# Patient Record
Sex: Female | Born: 1960 | Race: White | Hispanic: No | Marital: Married | Smoking: Never smoker
Health system: Southern US, Community
[De-identification: ages and names within clinical notes are randomized; demographics above are authoritative.]

## PROBLEM LIST (undated history)

## (undated) DIAGNOSIS — J8410 Pulmonary fibrosis, unspecified: Secondary | ICD-10-CM

## (undated) DIAGNOSIS — I10 Essential (primary) hypertension: Secondary | ICD-10-CM

## (undated) DIAGNOSIS — N3010 Interstitial cystitis (chronic) without hematuria: Secondary | ICD-10-CM

## (undated) DIAGNOSIS — K219 Gastro-esophageal reflux disease without esophagitis: Secondary | ICD-10-CM

## (undated) DIAGNOSIS — E039 Hypothyroidism, unspecified: Secondary | ICD-10-CM

## (undated) DIAGNOSIS — Z9981 Dependence on supplemental oxygen: Secondary | ICD-10-CM

## (undated) DIAGNOSIS — F32A Depression, unspecified: Secondary | ICD-10-CM

## (undated) DIAGNOSIS — M797 Fibromyalgia: Secondary | ICD-10-CM

## (undated) DIAGNOSIS — K760 Fatty (change of) liver, not elsewhere classified: Secondary | ICD-10-CM

## (undated) DIAGNOSIS — R51 Headache: Secondary | ICD-10-CM

## (undated) DIAGNOSIS — K589 Irritable bowel syndrome without diarrhea: Secondary | ICD-10-CM

## (undated) DIAGNOSIS — D649 Anemia, unspecified: Secondary | ICD-10-CM

## (undated) DIAGNOSIS — J189 Pneumonia, unspecified organism: Secondary | ICD-10-CM

## (undated) DIAGNOSIS — G4730 Sleep apnea, unspecified: Secondary | ICD-10-CM

## (undated) DIAGNOSIS — R350 Frequency of micturition: Secondary | ICD-10-CM

## (undated) DIAGNOSIS — M7989 Other specified soft tissue disorders: Secondary | ICD-10-CM

## (undated) DIAGNOSIS — J45909 Unspecified asthma, uncomplicated: Secondary | ICD-10-CM

## (undated) DIAGNOSIS — F329 Major depressive disorder, single episode, unspecified: Secondary | ICD-10-CM

## (undated) DIAGNOSIS — Z973 Presence of spectacles and contact lenses: Secondary | ICD-10-CM

## (undated) DIAGNOSIS — N2889 Other specified disorders of kidney and ureter: Secondary | ICD-10-CM

## (undated) DIAGNOSIS — C801 Malignant (primary) neoplasm, unspecified: Secondary | ICD-10-CM

## (undated) HISTORY — PX: COLONOSCOPY: SHX5424

## (undated) HISTORY — PX: CYSTOSCOPY: SHX5120

## (undated) HISTORY — PX: OTHER SURGICAL HISTORY: SHX169

## (undated) HISTORY — PX: TUBAL LIGATION: SHX77

---

## 1993-11-09 HISTORY — PX: CHOLECYSTECTOMY: SHX55

## 1994-11-09 HISTORY — PX: ABDOMINAL HYSTERECTOMY: SHX81

## 2002-11-09 HISTORY — PX: POSTERIOR CERVICAL LAMINECTOMY: SHX2248

## 2008-02-21 ENCOUNTER — Ambulatory Visit: Payer: Self-pay

## 2008-06-15 ENCOUNTER — Ambulatory Visit: Payer: Self-pay

## 2008-12-13 ENCOUNTER — Ambulatory Visit: Payer: Self-pay | Admitting: Unknown Physician Specialty

## 2009-01-23 ENCOUNTER — Encounter: Payer: Self-pay | Admitting: Unknown Physician Specialty

## 2009-02-07 ENCOUNTER — Encounter: Payer: Self-pay | Admitting: Unknown Physician Specialty

## 2009-03-09 ENCOUNTER — Encounter: Payer: Self-pay | Admitting: Unknown Physician Specialty

## 2009-04-10 ENCOUNTER — Ambulatory Visit: Payer: Self-pay

## 2009-04-18 ENCOUNTER — Ambulatory Visit: Payer: Self-pay

## 2009-11-04 ENCOUNTER — Ambulatory Visit: Payer: Self-pay

## 2010-11-11 ENCOUNTER — Ambulatory Visit: Payer: Self-pay | Admitting: Otolaryngology

## 2011-07-15 ENCOUNTER — Ambulatory Visit: Payer: Self-pay

## 2012-07-10 HISTORY — PX: CERVICAL LAMINECTOMY: SHX94

## 2012-08-06 ENCOUNTER — Emergency Department (HOSPITAL_COMMUNITY): Payer: PRIVATE HEALTH INSURANCE

## 2012-08-06 ENCOUNTER — Encounter (HOSPITAL_COMMUNITY): Payer: Self-pay | Admitting: Emergency Medicine

## 2012-08-06 ENCOUNTER — Emergency Department (HOSPITAL_COMMUNITY)
Admission: EM | Admit: 2012-08-06 | Discharge: 2012-08-06 | Disposition: A | Discharge status: Home / Self Care | Payer: PRIVATE HEALTH INSURANCE | Attending: Emergency Medicine | Admitting: Emergency Medicine

## 2012-08-06 DIAGNOSIS — M5412 Radiculopathy, cervical region: Secondary | ICD-10-CM | POA: Insufficient documentation

## 2012-08-06 DIAGNOSIS — J45909 Unspecified asthma, uncomplicated: Secondary | ICD-10-CM | POA: Insufficient documentation

## 2012-08-06 DIAGNOSIS — Z794 Long term (current) use of insulin: Secondary | ICD-10-CM | POA: Insufficient documentation

## 2012-08-06 DIAGNOSIS — M79609 Pain in unspecified limb: Secondary | ICD-10-CM | POA: Insufficient documentation

## 2012-08-06 DIAGNOSIS — E119 Type 2 diabetes mellitus without complications: Secondary | ICD-10-CM | POA: Insufficient documentation

## 2012-08-06 DIAGNOSIS — Z79899 Other long term (current) drug therapy: Secondary | ICD-10-CM | POA: Insufficient documentation

## 2012-08-06 HISTORY — DX: Unspecified asthma, uncomplicated: J45.909

## 2012-08-06 MED ORDER — ONDANSETRON HCL 4 MG/2ML IJ SOLN
4.0000 mg | Freq: Once | INTRAMUSCULAR | Status: AC
  Administered 2012-08-06: 4 mg via INTRAVENOUS
  Filled 2012-08-06: qty 2

## 2012-08-06 MED ORDER — SODIUM CHLORIDE 0.9 % IV SOLN
Freq: Once | INTRAVENOUS | Status: AC
  Administered 2012-08-06: 13:00:00 via INTRAVENOUS

## 2012-08-06 MED ORDER — HYDROMORPHONE HCL PF 1 MG/ML IJ SOLN
1.0000 mg | Freq: Once | INTRAMUSCULAR | Status: AC
  Administered 2012-08-06: 1 mg via INTRAVENOUS
  Filled 2012-08-06: qty 1

## 2012-08-06 MED ORDER — DIAZEPAM 5 MG PO TABS: 5.0000 mg | ORAL_TABLET | Freq: Three times a day (TID) | ORAL | Status: DC | PRN

## 2012-08-06 MED ORDER — KETOROLAC TROMETHAMINE 30 MG/ML IJ SOLN
INTRAMUSCULAR | Status: AC
  Filled 2012-08-06: qty 1

## 2012-08-06 MED ORDER — SODIUM CHLORIDE 0.9 % IV BOLUS (SEPSIS)
1000.0000 mL | Freq: Once | INTRAVENOUS | Status: AC
  Administered 2012-08-06: 1000 mL via INTRAVENOUS

## 2012-08-06 MED ORDER — DIAZEPAM 5 MG/ML IJ SOLN
5.0000 mg | Freq: Once | INTRAMUSCULAR | Status: AC
  Administered 2012-08-06: 5 mg via INTRAVENOUS
  Filled 2012-08-06: qty 2

## 2012-08-06 MED ORDER — PREDNISONE (PAK) 10 MG PO TABS: 10.0000 mg | ORAL_TABLET | Freq: Every day | ORAL | Status: DC

## 2012-08-06 MED ORDER — KETOROLAC TROMETHAMINE 15 MG/ML IJ SOLN
15.0000 mg | Freq: Once | INTRAMUSCULAR | Status: AC
  Administered 2012-08-06: 15 mg via INTRAVENOUS

## 2012-08-06 MED ORDER — MORPHINE SULFATE 4 MG/ML IJ SOLN
6.0000 mg | Freq: Once | INTRAMUSCULAR | Status: AC
  Administered 2012-08-06: 6 mg via INTRAVENOUS
  Filled 2012-08-06: qty 2

## 2012-08-06 MED ORDER — OXYCODONE-ACETAMINOPHEN 5-325 MG PO TABS: 1.0000 | ORAL_TABLET | ORAL | Status: DC | PRN

## 2012-08-06 NOTE — ED Notes (Addendum)
Pt c/o neck and left arm pain onset 1 hour ago. Pt had flu shot 2 days ago given in left arm. Pt reports pain radiates from left side of neck down to fingers of left arm. Pt reports pain is not like soreness from flu shot, feels like when she blew out a disk in the past. Pt took 2 advil at 0800 without relief.

## 2012-08-06 NOTE — ED Provider Notes (Signed)
History    51 year old female with left arm pain. Onset about an hour and half prior to arrival. Patient denies trauma. She says she's going to get breakfast the pain started. Pain starts in her left shoulder and radiates all the way down her hand. Worse with movements. Her left arm and hand feels weak as compared to the right. No numbness or tingling. No fevers or chills. Denies use of blood thinning medication. Denies recent trauma. CSN: [id number]  Arrival date & time 08/06/12  [id number]   First MD Initiated Contact with Patient 08/06/12 0920      Chief Complaint  Patient presents with  . Neck Pain  . Arm Pain    (Consider location/radiation/quality/duration/timing/severity/associated sxs/prior treatment) HPI  Past Medical History  Diagnosis Date  . Asthma   . Diabetes mellitus     Past Surgical History  Procedure Date  . Cesarean section   . Back surgery   . Abdominal hysterectomy     No family history on file.  History  Substance Use Topics  . Smoking status: Never Smoker   . Smokeless tobacco: Not on file  . Alcohol Use: No    OB History    Grav Para Term Preterm Abortions TAB SAB Ect Mult Living                  Review of Systems   Review of symptoms negative unless otherwise noted in HPI.   Allergies  Sulfa antibiotics and Latex  Home Medications   Current Outpatient Rx  Name Route Sig Dispense Refill  . ALBUTEROL SULFATE HFA 108 (90 BASE) MCG/ACT IN AERS Inhalation Inhale 2 puffs into the lungs every 6 (six) hours as needed. For shortness of breath    . ALPRAZOLAM 0.5 MG PO TABS Oral Take 0.5 mg by mouth daily as needed.    . BUPROPION HCL 100 MG PO TABS Oral Take 100 mg by mouth 2 (two) times daily.    . CYCLOBENZAPRINE HCL 10 MG PO TABS Oral Take 10 mg by mouth 3 (three) times daily as needed.    Marland Kitchen ESOMEPRAZOLE MAGNESIUM 40 MG PO CPDR Oral Take 40 mg by mouth daily before breakfast.    . ESTROGENS CONJUGATED 0.625 MG PO TABS Oral Take 0.625 mg by  mouth daily. Take daily for 21 days then do not take for 7 days.    Marland Kitchen FLUTICASONE-SALMETEROL 100-50 MCG/DOSE IN AEPB Inhalation Inhale 1 puff into the lungs every 12 (twelve) hours.    . INSULIN GLARGINE 100 UNIT/ML [place] SOLN Subcutaneous Inject 14 Units into the skin at bedtime.    Marland Kitchen LEVOTHYROXINE SODIUM 175 MCG PO TABS Oral Take 175 mcg by mouth daily.    Marland Kitchen LIRAGLUTIDE 18 MG/3ML [place] SOLN Subcutaneous Inject 1.8 mg into the skin daily.    Marland Kitchen LISINOPRIL 20 MG PO TABS Oral Take 20 mg by mouth daily.    Marland Kitchen METFORMIN HCL ER 500 MG PO TB24 Oral Take 1,000 mg by mouth 2 (two) times daily.    Marland Kitchen POTASSIUM CHLORIDE ER 10 MEQ PO TBCR Oral Take 10 mEq by mouth 2 (two) times daily.    Marland Kitchen PRAVASTATIN SODIUM 40 MG PO TABS Oral Take 40 mg by mouth daily.    Marland Kitchen PREGABALIN 50 MG PO CAPS Oral Take 50 mg by mouth 2 (two) times daily.    . TOPIRAMATE 25 MG PO TABS Oral Take 25 mg by mouth 2 (two) times daily.    . TRIAMTERENE-HCTZ 37.5-25 MG PO  TABS Oral Take 1 tablet by mouth daily.    Marland Kitchen VITAMIN E 400 UNITS PO CAPS Oral Take 400 Units by mouth daily.    Marland Kitchen DIAZEPAM 5 MG PO TABS Oral Take 1 tablet (5 mg total) by mouth every 8 (eight) hours as needed (muscle spasm). 15 tablet 0  . OXYCODONE-ACETAMINOPHEN 5-325 MG PO TABS Oral Take 1-2 tablets by mouth every 4 (four) hours as needed for pain. 20 tablet 0  . PREDNISONE (PAK) 10 MG PO TABS Oral Take 1 tablet (10 mg total) by mouth daily. Day 1: take 6 tabs.  Day 2: 5 tabs  Day 3: 4 tabs  Day 4: 3 tabs  Day 5: 2 tabs  Day 6: 1 tab 21 tablet 0    BP 134/76  Pulse 93  Temp 98.1 F (36.7 C) (Oral)  Resp 16  SpO2 98%  Physical Exam  Nursing note and vitals reviewed. Constitutional: She appears well-developed and well-nourished. No distress.  HENT:  Head: Normocephalic and atraumatic.  Eyes: Conjunctivae normal are normal. Right eye exhibits no discharge. Left eye exhibits no discharge.  Neck: Neck supple.  Cardiovascular: Normal rate, regular rhythm and normal heart  sounds.  Exam reveals no gallop and no friction rub.   No murmur heard. Pulmonary/Chest: Effort normal and breath sounds normal. No respiratory distress.  Abdominal: Soft. She exhibits no distension. There is no tenderness.  Musculoskeletal: She exhibits no edema and no tenderness.       Left arm grossly normal in appearance and symmetric as compared to the right. Increase of pain with range of motion of the shoulder and the neck. Mildly decreased strength in the median nerve distribution. Vascularly intact.  Neurological: She is alert.  Skin: Skin is warm and dry.  Psychiatric: She has a normal mood and affect. Her behavior is normal. Thought content normal.    ED Course  Procedures (including critical care time)  Labs Reviewed - No data to display Mr Cervical Spine Wo Contrast  08/06/2012  *RADIOLOGY REPORT*  Clinical Data: Acute onset left neck and arm pain.  MRI CERVICAL SPINE WITHOUT CONTRAST  Technique:  Multiplanar and multiecho pulse sequences of the cervical spine, to include the craniocervical junction and cervicothoracic junction, were obtained according to standard protocol without intravenous contrast.  Comparison: None.  Findings: Vertebral body height, signal and alignment are maintained.  The craniocervical junction is normal and cervical cord signal is normal.  There is partial visualization of mucosal thickening in the left maxillary sinus.  C2-3:  Negative.  C3-4:  Facet degenerative disease on the left.  No disc bulge or protrusion.  Central canal and foramina open.  C4-5:  Negative.  C5-6:  Mild uncovertebral disease on the right.  Central canal and foramina open.  C6-7:  The patient has a disc osteophyte complex eccentric to the left and left much worse than right uncovertebral disease.  Ventral thecal sac is nearly effaced with a central canal mildly narrowed. There is encroachment on the left C7 root as it enters and within the left foramen.  Right foramen open.  C7-T1:  Mild  disc bulge but otherwise negative.  T1-2:  Negative.  IMPRESSION: Degenerative disease most notable at C6-7 where there is a disc osteophyte complex and left much worse than right uncovertebral disease.  Uncovertebral disease results in encroachment on the left C7 root as it enters and within the left foramen.  The central canal is mildly narrowed at this level.   Original Report  Authenticated By: Bernadene Bell. D'ALESSIO, M.D.      1. Cervical radiculopathy at C7       MDM  51 year old female with left upper extremity pain. Radicular in nature. Patient with some weakness on exam but suspect that this is more related to effort and degree of pain she is having. Because of this though patient did receive an MRI of her cervical spine. This was reviewed by Dr. Newell Coral, neurosurgery. Is recommending a course of steroids and when necessary pain medication. Outpatient followup with patient's PCP and if symptoms worsen or fail to improve the neurosurgery.        Raeford Razor, MD 08/06/12 1725

## 2012-08-06 NOTE — ED Provider Notes (Signed)
Patient moved to CDU holding for MRI c-spine. Patient with left radicular neck/arm pain.  Per Dr Juleen China, pt is complaining of weakness in left arm, unclear whether this is secondary to pain or actual weakness.    12:21 PM Pt currently in MRI.    Pt seen upon return from MRI.  Pt reports pain is 9/10.  Prior to initial treatment, states it was "50" out of 10.  Repeated dilaudid and zofran.  2:19 PM Patient reports she continues to have pain, dilaudid helps "a little bit."  Discussed results with patient as well as with Dr Juleen China.  Plan is for pain control, reexamination.  If pt continues to exhibit weakness, will consult neurosurgery.  If pt has normal strength after pain control, will d/c home with outpatient neurosurgical follow up.    3:22 PM Patient's pain is now 3/10, pt is sleepy.  I have reexamined patient and have found that she does have decreased strength on left, primarily with triceps. She is tender to palpation over lower c-spine with no crepitus or step-offs. Sensation intact.  Will consult neurosurgery.    Dr Juleen China has discussed patient with Dr Newell Coral.  Patient to be d/c home with steroid taper, pain medication, PCP follow up, with referral to Dr Newell Coral if needed.  Pt verbalized understanding and agrees with plan. Return precautions given.     Mr Cervical Spine Wo Contrast  08/06/2012  *RADIOLOGY REPORT*  Clinical Data: Acute onset left neck and arm pain.  MRI CERVICAL SPINE WITHOUT CONTRAST  Technique:  Multiplanar and multiecho pulse sequences of the cervical spine, to include the craniocervical junction and cervicothoracic junction, were obtained according to standard protocol without intravenous contrast.  Comparison: None.  Findings: Vertebral body height, signal and alignment are maintained.  The craniocervical junction is normal and cervical cord signal is normal.  There is partial visualization of mucosal thickening in the left maxillary sinus.  C2-3:  Negative.  C3-4:  Facet  degenerative disease on the left.  No disc bulge or protrusion.  Central canal and foramina open.  C4-5:  Negative.  C5-6:  Mild uncovertebral disease on the right.  Central canal and foramina open.  C6-7:  The patient has a disc osteophyte complex eccentric to the left and left much worse than right uncovertebral disease.  Ventral thecal sac is nearly effaced with a central canal mildly narrowed. There is encroachment on the left C7 root as it enters and within the left foramen.  Right foramen open.  C7-T1:  Mild disc bulge but otherwise negative.  T1-2:  Negative.  IMPRESSION: Degenerative disease most notable at C6-7 where there is a disc osteophyte complex and left much worse than right uncovertebral disease.  Uncovertebral disease results in encroachment on the left C7 root as it enters and within the left foramen.  The central canal is mildly narrowed at this level.   Original Report Authenticated By: Bernadene Bell. Maricela Curet, M.D.       [facility], [place] 08/06/12 2052

## 2012-08-06 NOTE — ED Notes (Signed)
Pt. Is resting comfortablly, waiting for test results.   Husband at the bedside

## 2012-08-07 NOTE — ED Provider Notes (Signed)
Medical screening examination/treatment/procedure(s) were performed by non-physician practitioner and as supervising physician I was immediately available for consultation/collaboration.  Raeford Razor, MD 08/07/12 [phone]

## 2012-10-13 ENCOUNTER — Ambulatory Visit: Payer: Self-pay

## 2012-10-24 ENCOUNTER — Encounter: Payer: Self-pay | Admitting: Neurosurgery

## 2012-11-09 ENCOUNTER — Encounter: Payer: Self-pay | Admitting: Neurosurgery

## 2013-11-10 ENCOUNTER — Ambulatory Visit: Payer: Self-pay | Admitting: Physical Medicine and Rehabilitation

## 2013-11-13 ENCOUNTER — Ambulatory Visit: Payer: Self-pay | Admitting: Physical Medicine and Rehabilitation

## 2013-11-17 ENCOUNTER — Ambulatory Visit: Payer: Self-pay | Admitting: Internal Medicine

## 2013-11-17 LAB — CBC CANCER CENTER
BASOS ABS: 0.1 x10 3/mm (ref 0.0–0.1)
Basophil %: 0.8 %
Eosinophil #: 0.5 x10 3/mm (ref 0.0–0.7)
Eosinophil %: 5.1 %
HCT: 41.8 % (ref 35.0–47.0)
HGB: 14 g/dL (ref 12.0–16.0)
Lymphocyte #: 2.1 x10 3/mm (ref 1.0–3.6)
Lymphocyte %: 22.7 %
MCH: 29.4 pg (ref 26.0–34.0)
MCHC: 33.4 g/dL (ref 32.0–36.0)
MCV: 88 fL (ref 80–100)
MONOS PCT: 9 %
Monocyte #: 0.8 x10 3/mm (ref 0.2–0.9)
NEUTROS ABS: 5.7 x10 3/mm (ref 1.4–6.5)
Neutrophil %: 62.4 %
PLATELETS: 267 x10 3/mm (ref 150–440)
RBC: 4.76 10*6/uL (ref 3.80–5.20)
RDW: 15 % — AB (ref 11.5–14.5)
WBC: 9.2 x10 3/mm (ref 3.6–11.0)

## 2013-11-17 LAB — COMPREHENSIVE METABOLIC PANEL
ALBUMIN: 3.9 g/dL (ref 3.4–5.0)
ALT: 39 U/L (ref 12–78)
Alkaline Phosphatase: 99 U/L
Anion Gap: 15 (ref 7–16)
BUN: 16 mg/dL (ref 7–18)
Bilirubin,Total: 0.3 mg/dL (ref 0.2–1.0)
CALCIUM: 9.6 mg/dL (ref 8.5–10.1)
CHLORIDE: 96 mmol/L — AB (ref 98–107)
Co2: 25 mmol/L (ref 21–32)
Creatinine: 1.24 mg/dL (ref 0.60–1.30)
GFR CALC AF AMER: 57 — AB
GFR CALC NON AF AMER: 50 — AB
Glucose: 95 mg/dL (ref 65–99)
Osmolality: 273 (ref 275–301)
Potassium: 4.1 mmol/L (ref 3.5–5.1)
SGOT(AST): 30 U/L (ref 15–37)
Sodium: 136 mmol/L (ref 136–145)
Total Protein: 7.6 g/dL (ref 6.4–8.2)

## 2013-11-17 LAB — PROTIME-INR
INR: 1
Prothrombin Time: 13.4 secs (ref 11.5–14.7)

## 2013-11-17 LAB — FERRITIN: Ferritin (ARMC): 46 ng/mL (ref 8–388)

## 2013-11-17 LAB — IRON AND TIBC
IRON: 101 ug/dL (ref 50–170)
Iron Bind.Cap.(Total): 413 ug/dL (ref 250–450)
Iron Saturation: 24 %
UNBOUND IRON-BIND. CAP.: 312 ug/dL

## 2013-11-17 LAB — APTT: ACTIVATED PTT: 27.7 s (ref 23.6–35.9)

## 2013-11-21 ENCOUNTER — Other Ambulatory Visit (HOSPITAL_COMMUNITY): Payer: Self-pay | Admitting: Urology

## 2013-11-21 ENCOUNTER — Ambulatory Visit (HOSPITAL_COMMUNITY)
Admission: RE | Admit: 2013-11-21 | Discharge: 2013-11-21 | Disposition: A | Discharge status: Home / Self Care | Payer: 59 | Source: Ambulatory Visit | Attending: Urology | Admitting: Urology

## 2013-11-21 DIAGNOSIS — N2889 Other specified disorders of kidney and ureter: Secondary | ICD-10-CM

## 2013-11-21 DIAGNOSIS — R918 Other nonspecific abnormal finding of lung field: Secondary | ICD-10-CM | POA: Insufficient documentation

## 2013-11-21 DIAGNOSIS — J45909 Unspecified asthma, uncomplicated: Secondary | ICD-10-CM | POA: Insufficient documentation

## 2013-11-21 DIAGNOSIS — E119 Type 2 diabetes mellitus without complications: Secondary | ICD-10-CM | POA: Insufficient documentation

## 2013-11-21 DIAGNOSIS — N289 Disorder of kidney and ureter, unspecified: Secondary | ICD-10-CM | POA: Insufficient documentation

## 2013-11-23 ENCOUNTER — Other Ambulatory Visit (HOSPITAL_COMMUNITY): Payer: Self-pay | Admitting: Urology

## 2013-11-23 DIAGNOSIS — N2889 Other specified disorders of kidney and ureter: Secondary | ICD-10-CM

## 2013-12-01 ENCOUNTER — Ambulatory Visit (HOSPITAL_COMMUNITY)
Admission: RE | Admit: 2013-12-01 | Discharge: 2013-12-01 | Disposition: A | Discharge status: Home / Self Care | Payer: 59 | Source: Ambulatory Visit | Attending: Urology | Admitting: Urology

## 2013-12-01 DIAGNOSIS — N289 Disorder of kidney and ureter, unspecified: Secondary | ICD-10-CM | POA: Insufficient documentation

## 2013-12-01 DIAGNOSIS — J984 Other disorders of lung: Secondary | ICD-10-CM | POA: Insufficient documentation

## 2013-12-01 DIAGNOSIS — N2889 Other specified disorders of kidney and ureter: Secondary | ICD-10-CM

## 2013-12-01 MED ORDER — GADOBENATE DIMEGLUMINE 529 MG/ML IV SOLN
20.0000 mL | Freq: Once | INTRAVENOUS | Status: AC | PRN
  Administered 2013-12-01: 20 mL via INTRAVENOUS

## 2013-12-08 ENCOUNTER — Other Ambulatory Visit: Payer: Self-pay | Admitting: Urology

## 2013-12-10 ENCOUNTER — Ambulatory Visit: Payer: Self-pay | Admitting: Internal Medicine

## 2013-12-11 ENCOUNTER — Encounter (HOSPITAL_COMMUNITY): Payer: Self-pay | Admitting: Pharmacy Technician

## 2013-12-11 ENCOUNTER — Encounter (HOSPITAL_COMMUNITY)
Admission: RE | Admit: 2013-12-11 | Discharge: 2013-12-11 | Disposition: A | Discharge status: Home / Self Care | Payer: 59 | Source: Ambulatory Visit | Attending: Urology | Admitting: Urology

## 2013-12-11 ENCOUNTER — Encounter (HOSPITAL_COMMUNITY): Payer: Self-pay

## 2013-12-11 HISTORY — DX: Gastro-esophageal reflux disease without esophagitis: K21.9

## 2013-12-11 HISTORY — DX: Frequency of micturition: R35.0

## 2013-12-11 HISTORY — DX: Headache: R51

## 2013-12-11 HISTORY — DX: Major depressive disorder, single episode, unspecified: F32.9

## 2013-12-11 HISTORY — DX: Sleep apnea, unspecified: G47.30

## 2013-12-11 HISTORY — DX: Fibromyalgia: M79.7

## 2013-12-11 HISTORY — DX: Interstitial cystitis (chronic) without hematuria: N30.10

## 2013-12-11 HISTORY — DX: Other specified disorders of kidney and ureter: N28.89

## 2013-12-11 HISTORY — DX: Anemia, unspecified: D64.9

## 2013-12-11 HISTORY — DX: Depression, unspecified: F32.A

## 2013-12-11 HISTORY — DX: Irritable bowel syndrome, unspecified: K58.9

## 2013-12-11 HISTORY — DX: Hypothyroidism, unspecified: E03.9

## 2013-12-11 HISTORY — DX: Other specified soft tissue disorders: M79.89

## 2013-12-11 LAB — BASIC METABOLIC PANEL
BUN: 11 mg/dL (ref 6–23)
CHLORIDE: 100 meq/L (ref 96–112)
CO2: 25 mEq/L (ref 19–32)
Calcium: 9 mg/dL (ref 8.4–10.5)
Creatinine, Ser: 0.88 mg/dL (ref 0.50–1.10)
GFR calc non Af Amer: 74 mL/min — ABNORMAL LOW (ref >90)
GFR, EST AFRICAN AMERICAN: 85 mL/min — AB (ref >90)
GLUCOSE: 104 mg/dL — AB (ref 70–99)
POTASSIUM: 3.6 meq/L — AB (ref 3.7–5.3)
Sodium: 139 mEq/L (ref 137–147)

## 2013-12-11 LAB — CBC
HEMATOCRIT: 36.8 % (ref 36.0–46.0)
Hemoglobin: 12.6 g/dL (ref 12.0–15.0)
MCH: 29.8 pg (ref 26.0–34.0)
MCHC: 34.2 g/dL (ref 30.0–36.0)
MCV: 87 fL (ref 78.0–100.0)
Platelets: 251 10*3/uL (ref 150–400)
RBC: 4.23 MIL/uL (ref 3.87–5.11)
RDW: 14.3 % (ref 11.5–15.5)
WBC: 6.8 10*3/uL (ref 4.0–10.5)

## 2013-12-11 LAB — ABO/RH: ABO/RH(D): O POS

## 2013-12-11 NOTE — Patient Instructions (Addendum)
Jaylon Grode  12/11/2013                           YOUR PROCEDURE IS SCHEDULED ON: 12/14/13               PLEASE REPORT TO SHORT STAY CENTER AT : 5:15 AM               CALL THIS NUMBER IF ANY PROBLEMS THE DAY OF SURGERY :               [phone]                   NO VISITORS UNDER AGE 54 DUE TO FLU RESTRICTIONS                REMEMBER:   Do not eat food or drink liquids AFTER MIDNIGHT    Take these medicines the morning of surgery with A SIP OF WATER: WELLBUTRIN / NEXIUM / PREMARIN / LEVOTHYROXINE / VESICARE / TOPAMAX / MAY TAKE HYDROCODONE IF NEEDED / BRING INHALERS  / TAKE 1/2 DOSE OF LANTUS INSULIN THE NIGHT BEFORE SURG   Do not wear jewelry, make-up   Do not wear lotions, powders, or perfumes.   Do not shave legs or underarms 12 hrs. before surgery (men may shave face)  Do not bring valuables to the hospital.  Contacts, dentures or bridgework may not be worn into surgery.  Leave suitcase in the car. After surgery it may be brought to your room.  For patients admitted to the hospital more than one night, checkout time is 11:00                          The day of discharge.   Patients discharged the day of surgery will not be allowed to drive home                             If going home same day of surgery, must have someone stay with you first                           24 hrs at home and arrange for some one to drive you home from hospital.    Special Instructions:   Please read over the following fact sheets that you were given:               1. [employer]                                                X_____________________________________________________________________        Failure to follow these instructions may result in cancellation of your surgery

## 2013-12-13 NOTE — Anesthesia Preprocedure Evaluation (Addendum)
Anesthesia Evaluation  Patient identified by MRN, date of birth, ID band Patient awake    Reviewed: Allergy & Precautions, H&P , NPO status , Patient's Chart, lab work & pertinent test results  Airway Mallampati: II TM Distance: >3 FB Neck ROM: Full    Dental  (+) Teeth Intact and Dental Advisory Given   Pulmonary asthma , sleep apnea (Noncompliant with CPAP) ,  breath sounds clear to auscultation  Pulmonary exam normal       Cardiovascular negative cardio ROS  Rhythm:Regular Rate:Normal     Neuro/Psych  Headaches, Depression    GI/Hepatic Neg liver ROS, GERD-  Medicated,  Endo/Other  diabetes, Type 2, Oral Hypoglycemic Agents and Insulin DependentHypothyroidism Morbid obesity  Renal/GU negative Renal ROS  negative genitourinary   Musculoskeletal  (+) Fibromyalgia -  Abdominal   Peds  Hematology  (+) anemia ,   Anesthesia Other Findings   Reproductive/Obstetrics                          Anesthesia Physical Anesthesia Plan  ASA: III  Anesthesia Plan: General   Post-op Pain Management:    Induction: Intravenous  Airway Management Planned: Oral ETT  Additional Equipment:   Intra-op Plan:   Post-operative Plan: Extubation in OR  Informed Consent: I have reviewed the patients History and Physical, chart, labs and discussed the procedure including the risks, benefits and alternatives for the proposed anesthesia with the patient or authorized representative who has indicated his/her understanding and acceptance.   Dental advisory given  Plan Discussed with: CRNA  Anesthesia Plan Comments:         Anesthesia Quick Evaluation

## 2013-12-13 NOTE — H&P (Signed)
History of Present Illness Joann [name] is a 53 year old with the following urologic history:  1) Right renal mass: She was found to have an incidentally found to have a 2.8 cm endophytic right renal mass on an MRI of the lumbar spine in January 2015. CT imaging of the kidneys revealed an enhancing mass although there was some suspicion of a possible angiomyolipoma prompting further evaluation.  2) Urolithiasis: She was found to have a left renal calculus incidentally on her CT in January 2015. She has a family history of kidney stones.  3) Interstitial cystitis: She has a reported history of interstitial cystitis and has previously been evaluated and treated by Dr. Barnie Del in [facility] for this diagnosis.  Current treatment: Non  ** Her medical comorbidities include a history of CKD and diabetes.  Interval history:  She follows up today to review her recent MRI for further evaluation of her right renal mass. Following her last visit, I did review her CT scan in detail with Dr. Jobe Igo and there was some concern that she may potentially have an angiomyolipoma. Since her last visit, she did see Dr. Barnie Del who has previously cared for her. He had recommended proceding with surgical removal of her renal mass.   Past Medical History Problems  1. History of asthma (V12.69) 2. History of depression (V11.8) 3. History of diabetes mellitus (V12.29) 4. History of fibromyalgia (V13.59) 5. History of hypercholesterolemia (V12.29) 6. History of hypothyroidism (V12.29) 7. History of pulmonary fibrosis (V12.69) 8. History of Interstitial cystitis (595.1)  Surgical History Problems  1. History of Abdomino-Vaginal Vesical Neck Suspension 2. History of Cesarean Section 3. History of Cholecystectomy Laparoscopic 4. History of Hysterectomy 5. History of Laminectomy Cervical  Current Meds 1. Advair Diskus 250-50 MCG/DOSE Inhalation Aerosol Powder Breath Activated; INHALE  PUFFS  PRN;  Therapy:  (Recorded:13Jan2015) to Recorded 2. ALPRAZolam 0.5 MG Oral Tablet; TAKE TABLET  PRN;  Therapy: (Recorded:13Jan2015) to Recorded 3. BuPROPion HCl ER (SR) 100 MG Oral Tablet Extended Release 12 Hour;  Therapy: (Recorded:13Jan2015) to Recorded 4. Calcium TABS;  Therapy: (Recorded:13Jan2015) to Recorded 5. Cymbalta 30 MG Oral Capsule Delayed Release Particles;  Therapy: (Recorded:13Jan2015) to Recorded 6. EpiPen 0.3 MG/0.3ML DEVI;  Therapy: (Recorded:13Jan2015) to Recorded 7. Farxiga 5 MG Oral Tablet;  Therapy: (Recorded:13Jan2015) to Recorded 8. Ferrous Sulfate FC TABS;  Therapy: (Recorded:13Jan2015) to Recorded 9. Hydrocodone-Acetaminophen 10-325 MG Oral Tablet;  Therapy: (Recorded:13Jan2015) to Recorded 10. Klor-Con M10 10 MEQ Oral Tablet Extended Release;   Therapy: (Recorded:13Jan2015) to Recorded 11. Lantus 100 UNIT/ML Subcutaneous Solution;   Therapy: (Recorded:13Jan2015) to Recorded 12. Levothyroxine Sodium 175 MCG Oral Tablet;   Therapy: (Recorded:13Jan2015) to Recorded 13. Lisinopril 20 MG Oral Tablet;   Therapy: (Recorded:13Jan2015) to Recorded 14. MetFORMIN HCl - 500 MG Oral Tablet;   Therapy: (Recorded:13Jan2015) to Recorded 15. NexIUM 40 MG Oral Capsule Delayed Release;   Therapy: (Recorded:13Jan2015) to Recorded 16. NovoLOG Mix 70/30 (70-30) 100 UNIT/ML Subcutaneous Suspension;   Therapy: (Recorded:13Jan2015) to Recorded 17. OneTouch Ultra Blue In [employer];   Therapy: (Recorded:13Jan2015) to Recorded 18. Pravastatin Sodium 40 MG Oral Tablet;   Therapy: (Recorded:13Jan2015) to Recorded 19. Premarin 0.625 MG Oral Tablet;   Therapy: (Recorded:13Jan2015) to Recorded 20. Proventil HFA 108 (90 Base) MCG/ACT Inhalation Aerosol Solution; INHALE PUFFS  PRN;   Therapy: (Recorded:13Jan2015) to Recorded 21. Topiramate 25 MG Oral Tablet;   Therapy: (Recorded:13Jan2015) to Recorded 22. TraZODone HCl - 150 MG Oral Tablet;   Therapy: (Recorded:13Jan2015) to Recorded 23.  Triamterene-HCTZ 37.5-25 MG  Oral Tablet;   Therapy: (Recorded:13Jan2015) to Recorded 24. VESIcare 10 MG Oral Tablet;   Therapy: (Recorded:13Jan2015) to Recorded 25. Vitamin D3 2000 UNIT Oral Capsule;   Therapy: (Recorded:13Jan2015) to Recorded 26. Vitamin E TABS;   Therapy: (Recorded:13Jan2015) to Recorded  Allergies Medication  1. Sulfa Drugs Non-Medication  2. Latex 3. Nuts  Family History Problems  1. Family history of cardiac disorder (V17.49) : Paternal Grandmother 2. Family history of cerebrovascular accident (V17.1) : Father 3. Family history of chronic obstructive pulmonary disease (V17.6) : Mother, Father 4. Family history of diabetes mellitus (V18.0) : Maternal Grandmother, Maternal Uncle 5. Family history of kidney stones (V18.69) : Father, Brother 6. Family history of lung cancer (V16.1) : Mother  Social History Problems  1. Denied: History of Alcohol use 2. Married 3. Never a smoker (V49.89) 4. Occupation   surgical tech  Review of Systems  Genitourinary: no hematuria.  Constitutional: no night sweats and no recent weight loss.  Musculoskeletal: back pain.    Vitals Vital Signs [Data Includes: Last 1 Day]  Recorded: 28Jan2015 12:56PM  Blood Pressure: 113 / 74 Temperature: 98.3 F Heart Rate: 89  Physical Exam Constitutional: Well nourished and well developed . No acute distress.  ENT:. The ears and nose are normal in appearance.  Neck: The appearance of the neck is normal and no neck mass is present.  Pulmonary: No respiratory distress and normal respiratory rhythm and effort.  Cardiovascular: Heart rate and rhythm are normal . No peripheral edema.  Abdomen: The abdomen is soft and nontender.  Skin: Normal skin turgor, no visible rash and no visible skin lesions.  Neuro/Psych:. Mood and affect are appropriate.    Results/Data Urine [Data Includes: Last 1 Day]   [id number]  COLOR YELLOW   APPEARANCE CLEAR   SPECIFIC GRAVITY 1.010   pH 6.5    GLUCOSE > 1000 mg/dL  BILIRUBIN NEG   KETONE NEG mg/dL  BLOOD NEG   PROTEIN NEG mg/dL  UROBILINOGEN 0.2 mg/dL  NITRITE NEG   LEUKOCYTE ESTERASE NEG   Selected Results  COMPREHENSIVE METABOLIC PANEL [id number] 78:[id number] Raynelle Bring  SPECIMEN TYPE: BLOOD   Test Name Result Flag Reference  GLUCOSE 82 mg/dL  70-99  BUN 13 mg/dL  6-23  CREATININE 0.85 mg/dL  0.50-1.40  SODIUM 140 mEq/L  135-145  POTASSIUM 4.0 mEq/L  3.5-5.3  CHLORIDE 101 mEq/L  96-112  CO2 27 mEq/L  19-32  CALCIUM 9.4 mg/dL  8.4-10.5  TOTAL PROTEIN 6.6 g/dL  6.0-8.3  ALBUMIN 4.0 g/dL  3.5-5.2  AST/SGOT 23 U/L  0-37  ALT/SGPT 33 U/L  0-35  ALKALINE PHOSPHATASE 82 U/L  39-117  BILIRUBIN, TOTAL 0.2 mg/dL L 0.3-1.2  Est GFR, African American >89 mL/min    Est GFR, NonAfrican American 78 mL/min    THE ESTIMATED GFR IS A CALCULATION VALID FOR ADULTS (>=52 YEARS OLD) THAT USES THE CKD-EPI ALGORITHM TO ADJUST FOR AGE AND SEX. IT IS   NOT TO BE USED FOR CHILDREN, PREGNANT WOMEN, HOSPITALIZED PATIENTS,    PATIENTS ON DIALYSIS, OR WITH RAPIDLY CHANGING KIDNEY FUNCTION. ACCORDING TO THE NKDEP, EGFR >89 IS NORMAL, 60-89 SHOWS MILD IMPAIRMENT, 30-59 SHOWS MODERATE IMPAIRMENT, 15-29 SHOWS SEVERE IMPAIRMENT AND <15 IS ESRD.    I have independently reviewed her chest x-ray as well as her recent MRI. Her chest x-ray is negative for pulmonary metastases. Her MRI demonstrates a persistent 2.9 centimeter heterogeneous and enhancing mass which is endophytic located in the interpolar right kidney and extending centrally toward the  renal sinus. There is no clear fat identified which does raise concern for renal malignancy. There is no regional lymphadenopathy, renal vein involvement, contralateral renal masses that are worrisome, or other evidence of metastatic disease.  Plan Health Maintenance  1. UA With REFLEX; [Do Not Release]; Status:Complete;   Done: [id number] 12:39PM Renal mass  2. Follow-up Schedule Surgery Office  Follow-up   Status: Complete  Done: [id number]  Discussion/Summary 1. Right renal mass worrisome for renal malignancy:   The patient was provided information regarding their renal mass including the relative risk of benign versus malignant pathology and the natural history of renal cell carcinoma and other possible malignancies of the kidney. The role of renal biopsy, laboratory testing, and imaging studies to further characterize renal masses and/or the presence of metastatic disease were explained. We discussed the role of active surveillance, surgical therapy with both radical nephrectomy and nephron-sparing surgery, and ablative therapy in the treatment of renal masses. In addition, we discussed our goals of providing an accurate diagnosis and oncologic control while maintaining optimal renal function as appropriate based on the size, location, and complexity of their renal mass as well as their co-morbidities.    We have discussed the risks of treatment in detail including but not limited to bleeding, infection, heart attack, stroke, death, venothromoboembolism, cancer recurrence, injury/damage to surrounding organs and structures, urine leak, the possibility of open surgical conversion for patients undergoing minimally invasive surgery, the risk of developing chronic kidney disease and its associated implications, and the potential risk of end stage renal disease possibly necessitating dialysis.   I did recommend she proceed with a right partial nephrectomy. I do think that this likely can be performed in a minimally invasive fashion with robotic-assisted laparoscopic surgery although will require intraoperative ultrasound considering the endophytic nature of this tumor. She does understand the potential increased risk for open surgical conversion or total nephrectomy considering the complex nature of her mass. I also recommended proceeding with cystoscopy, retrograde pyelography, and ureteral stent placement prior  to her partial nephrectomy of the absolutely exclude a possible urothelial lesion but more importantly to provide postoperative stent drainage considering the fact that she will have a large entry into her collecting system. She is agreeable to proceed and gives her informed consent to go forward with the planned procedure.  Cc: Dr. Ma Hillock  Dr. Fermin Schwab    SignaturesElectronically signed by : Raynelle Bring, M.D.; Dec 06 2013  6:00PM EST

## 2013-12-14 ENCOUNTER — Encounter (HOSPITAL_COMMUNITY): Payer: Self-pay | Admitting: *Deleted

## 2013-12-14 ENCOUNTER — Inpatient Hospital Stay (HOSPITAL_COMMUNITY): Payer: 59 | Admitting: Anesthesiology

## 2013-12-14 ENCOUNTER — Inpatient Hospital Stay (HOSPITAL_COMMUNITY): Payer: 59

## 2013-12-14 ENCOUNTER — Encounter (HOSPITAL_COMMUNITY)
Admission: RE | Disposition: A | Discharge status: Home / Self Care | Payer: Self-pay | Source: Ambulatory Visit | Attending: Urology

## 2013-12-14 ENCOUNTER — Encounter (HOSPITAL_COMMUNITY): Payer: 59 | Admitting: Anesthesiology

## 2013-12-14 ENCOUNTER — Inpatient Hospital Stay (HOSPITAL_COMMUNITY)
Admission: RE | Admit: 2013-12-14 | Discharge: 2013-12-17 | DRG: 658 | Disposition: A | Discharge status: Home / Self Care | Payer: 59 | Source: Ambulatory Visit | Attending: Urology | Admitting: Urology

## 2013-12-14 DIAGNOSIS — Z801 Family history of malignant neoplasm of trachea, bronchus and lung: Secondary | ICD-10-CM

## 2013-12-14 DIAGNOSIS — F3289 Other specified depressive episodes: Secondary | ICD-10-CM | POA: Diagnosis present

## 2013-12-14 DIAGNOSIS — Z23 Encounter for immunization: Secondary | ICD-10-CM

## 2013-12-14 DIAGNOSIS — IMO0001 Reserved for inherently not codable concepts without codable children: Secondary | ICD-10-CM | POA: Diagnosis present

## 2013-12-14 DIAGNOSIS — Z823 Family history of stroke: Secondary | ICD-10-CM

## 2013-12-14 DIAGNOSIS — N200 Calculus of kidney: Secondary | ICD-10-CM | POA: Diagnosis present

## 2013-12-14 DIAGNOSIS — F329 Major depressive disorder, single episode, unspecified: Secondary | ICD-10-CM | POA: Diagnosis present

## 2013-12-14 DIAGNOSIS — E039 Hypothyroidism, unspecified: Secondary | ICD-10-CM | POA: Diagnosis present

## 2013-12-14 DIAGNOSIS — J45909 Unspecified asthma, uncomplicated: Secondary | ICD-10-CM | POA: Diagnosis present

## 2013-12-14 DIAGNOSIS — Z833 Family history of diabetes mellitus: Secondary | ICD-10-CM

## 2013-12-14 DIAGNOSIS — Z8249 Family history of ischemic heart disease and other diseases of the circulatory system: Secondary | ICD-10-CM

## 2013-12-14 DIAGNOSIS — Z79899 Other long term (current) drug therapy: Secondary | ICD-10-CM

## 2013-12-14 DIAGNOSIS — C649 Malignant neoplasm of unspecified kidney, except renal pelvis: Principal | ICD-10-CM | POA: Diagnosis present

## 2013-12-14 DIAGNOSIS — D49519 Neoplasm of unspecified behavior of unspecified kidney: Secondary | ICD-10-CM | POA: Diagnosis present

## 2013-12-14 DIAGNOSIS — N189 Chronic kidney disease, unspecified: Secondary | ICD-10-CM | POA: Diagnosis present

## 2013-12-14 DIAGNOSIS — E7800 Pure hypercholesterolemia, unspecified: Secondary | ICD-10-CM | POA: Diagnosis present

## 2013-12-14 DIAGNOSIS — E119 Type 2 diabetes mellitus without complications: Secondary | ICD-10-CM | POA: Diagnosis present

## 2013-12-14 HISTORY — PX: ROBOTIC ASSITED PARTIAL NEPHRECTOMY: SHX6087

## 2013-12-14 HISTORY — PX: CYSTOSCOPY W/ URETERAL STENT PLACEMENT: SHX1429

## 2013-12-14 LAB — BASIC METABOLIC PANEL
BUN: 11 mg/dL (ref 6–23)
CALCIUM: 8.5 mg/dL (ref 8.4–10.5)
CO2: 26 mEq/L (ref 19–32)
CREATININE: 1.17 mg/dL — AB (ref 0.50–1.10)
Chloride: 97 mEq/L (ref 96–112)
GFR calc non Af Amer: 52 mL/min — ABNORMAL LOW (ref >90)
GFR, EST AFRICAN AMERICAN: 61 mL/min — AB (ref >90)
Glucose, Bld: 144 mg/dL — ABNORMAL HIGH (ref 70–99)
Potassium: 4.5 mEq/L (ref 3.7–5.3)
Sodium: 134 mEq/L — ABNORMAL LOW (ref 137–147)

## 2013-12-14 LAB — GLUCOSE, CAPILLARY
GLUCOSE-CAPILLARY: 87 mg/dL (ref 70–99)
Glucose-Capillary: 126 mg/dL — ABNORMAL HIGH (ref 70–99)
Glucose-Capillary: 153 mg/dL — ABNORMAL HIGH (ref 70–99)

## 2013-12-14 LAB — HEMOGLOBIN AND HEMATOCRIT, BLOOD
HCT: 36.6 % (ref 36.0–46.0)
HEMOGLOBIN: 12.3 g/dL (ref 12.0–15.0)

## 2013-12-14 LAB — TYPE AND SCREEN
ABO/RH(D): O POS
ANTIBODY SCREEN: NEGATIVE

## 2013-12-14 SURGERY — ROBOTIC ASSITED PARTIAL NEPHRECTOMY
Anesthesia: General | Site: Ureter | Laterality: Right

## 2013-12-14 MED ORDER — IOHEXOL 300 MG/ML  SOLN
INTRAMUSCULAR | Status: DC | PRN
Start: 2013-12-14 — End: 2013-12-14
  Administered 2013-12-14: 7 mL via URETHRAL

## 2013-12-14 MED ORDER — LACTATED RINGERS IV SOLN: INTRAVENOUS | Status: DC

## 2013-12-14 MED ORDER — PHENYLEPHRINE HCL 10 MG/ML IJ SOLN
10.0000 mg | INTRAMUSCULAR | Status: DC | PRN
  Administered 2013-12-14: 50 ug/min via INTRAVENOUS

## 2013-12-14 MED ORDER — PHENYLEPHRINE HCL 10 MG/ML IJ SOLN
INTRAMUSCULAR | Status: AC
  Filled 2013-12-14: qty 1

## 2013-12-14 MED ORDER — ROCURONIUM BROMIDE 100 MG/10ML IV SOLN
INTRAVENOUS | Status: AC
  Filled 2013-12-14: qty 1

## 2013-12-14 MED ORDER — PANTOPRAZOLE SODIUM 40 MG PO TBEC
80.0000 mg | DELAYED_RELEASE_TABLET | Freq: Every day | ORAL | Status: DC
  Administered 2013-12-15 – 2013-12-17 (×3): 80 mg via ORAL
  Filled 2013-12-14 (×4): qty 2

## 2013-12-14 MED ORDER — SUCCINYLCHOLINE CHLORIDE 20 MG/ML IJ SOLN
INTRAMUSCULAR | Status: AC
  Filled 2013-12-14: qty 1

## 2013-12-14 MED ORDER — SIMVASTATIN 20 MG PO TABS
20.0000 mg | ORAL_TABLET | Freq: Every day | ORAL | Status: DC
  Administered 2013-12-15 – 2013-12-16 (×2): 20 mg via ORAL
  Filled 2013-12-14 (×4): qty 1

## 2013-12-14 MED ORDER — PHENYLEPHRINE HCL 10 MG/ML IJ SOLN
INTRAMUSCULAR | Status: DC | PRN
  Administered 2013-12-14: 80 ug via INTRAVENOUS
  Administered 2013-12-14 (×2): 40 ug via INTRAVENOUS
  Administered 2013-12-14 (×2): 80 ug via INTRAVENOUS

## 2013-12-14 MED ORDER — 0.9 % SODIUM CHLORIDE (POUR BTL) OPTIME
TOPICAL | Status: DC | PRN
  Administered 2013-12-14: 2000 mL

## 2013-12-14 MED ORDER — CEFAZOLIN SODIUM-DEXTROSE 2-3 GM-% IV SOLR
2.0000 g | INTRAVENOUS | Status: AC
  Administered 2013-12-14: 2 g via INTRAVENOUS

## 2013-12-14 MED ORDER — SODIUM CHLORIDE 0.9 % IJ SOLN
INTRAMUSCULAR | Status: DC | PRN
  Administered 2013-12-14: 11:00:00

## 2013-12-14 MED ORDER — ZOLPIDEM TARTRATE 5 MG PO TABS
5.0000 mg | ORAL_TABLET | Freq: Every evening | ORAL | Status: DC | PRN
  Administered 2013-12-15: 5 mg via ORAL
  Filled 2013-12-14: qty 1

## 2013-12-14 MED ORDER — STERILE WATER FOR IRRIGATION IR SOLN
Status: DC | PRN
  Administered 2013-12-14: 3000 mL

## 2013-12-14 MED ORDER — SUCCINYLCHOLINE CHLORIDE 20 MG/ML IJ SOLN
INTRAMUSCULAR | Status: DC | PRN
  Administered 2013-12-14: 100 mg via INTRAVENOUS

## 2013-12-14 MED ORDER — PROPOFOL 10 MG/ML IV BOLUS
INTRAVENOUS | Status: AC
  Filled 2013-12-14: qty 20

## 2013-12-14 MED ORDER — ONDANSETRON HCL 4 MG/2ML IJ SOLN
INTRAMUSCULAR | Status: DC | PRN
  Administered 2013-12-14: 4 mg via INTRAVENOUS

## 2013-12-14 MED ORDER — ONDANSETRON HCL 4 MG/2ML IJ SOLN
INTRAMUSCULAR | Status: AC
  Filled 2013-12-14: qty 2

## 2013-12-14 MED ORDER — GLYCOPYRROLATE 0.2 MG/ML IJ SOLN
INTRAMUSCULAR | Status: AC
  Filled 2013-12-14: qty 3

## 2013-12-14 MED ORDER — PNEUMOCOCCAL VAC POLYVALENT 25 MCG/0.5ML IJ INJ
0.5000 mL | INJECTION | INTRAMUSCULAR | Status: AC
  Administered 2013-12-15: 0.5 mL via INTRAMUSCULAR
  Filled 2013-12-14 (×2): qty 0.5

## 2013-12-14 MED ORDER — HYDROMORPHONE HCL PF 2 MG/ML IJ SOLN
INTRAMUSCULAR | Status: AC
  Filled 2013-12-14: qty 1

## 2013-12-14 MED ORDER — MOMETASONE FURO-FORMOTEROL FUM 100-5 MCG/ACT IN AERO
2.0000 | INHALATION_SPRAY | Freq: Two times a day (BID) | RESPIRATORY_TRACT | Status: DC
  Filled 2013-12-14: qty 8.8

## 2013-12-14 MED ORDER — LACTATED RINGERS IV SOLN
INTRAVENOUS | Status: DC | PRN
  Administered 2013-12-14 (×3): via INTRAVENOUS

## 2013-12-14 MED ORDER — CEFAZOLIN SODIUM 1-5 GM-% IV SOLN
1.0000 g | Freq: Three times a day (TID) | INTRAVENOUS | Status: AC
  Administered 2013-12-14 (×2): 1 g via INTRAVENOUS
  Filled 2013-12-14 (×2): qty 50

## 2013-12-14 MED ORDER — ONDANSETRON HCL 4 MG/2ML IJ SOLN: 4.0000 mg | INTRAMUSCULAR | Status: DC | PRN

## 2013-12-14 MED ORDER — BUPROPION HCL 100 MG PO TABS
100.0000 mg | ORAL_TABLET | Freq: Two times a day (BID) | ORAL | Status: DC
  Administered 2013-12-14 – 2013-12-17 (×6): 100 mg via ORAL
  Filled 2013-12-14 (×8): qty 1

## 2013-12-14 MED ORDER — LIDOCAINE HCL (CARDIAC) 20 MG/ML IV SOLN
INTRAVENOUS | Status: AC
  Filled 2013-12-14: qty 5

## 2013-12-14 MED ORDER — MORPHINE SULFATE 2 MG/ML IJ SOLN
2.0000 mg | INTRAMUSCULAR | Status: DC | PRN
  Administered 2013-12-14 – 2013-12-15 (×4): 2 mg via INTRAVENOUS
  Filled 2013-12-14 (×4): qty 1

## 2013-12-14 MED ORDER — BUPIVACAINE-EPINEPHRINE PF 0.25-1:200000 % IJ SOLN
INTRAMUSCULAR | Status: AC
  Filled 2013-12-14: qty 30

## 2013-12-14 MED ORDER — ALBUTEROL SULFATE (2.5 MG/3ML) 0.083% IN NEBU
2.5000 mg | INHALATION_SOLUTION | Freq: Four times a day (QID) | RESPIRATORY_TRACT | Status: DC | PRN
  Administered 2013-12-15: 2.5 mg via RESPIRATORY_TRACT
  Filled 2013-12-14: qty 3

## 2013-12-14 MED ORDER — FENTANYL CITRATE 0.05 MG/ML IJ SOLN
INTRAMUSCULAR | Status: DC | PRN
  Administered 2013-12-14: 50 ug via INTRAVENOUS
  Administered 2013-12-14: 100 ug via INTRAVENOUS
  Administered 2013-12-14 (×4): 50 ug via INTRAVENOUS

## 2013-12-14 MED ORDER — INSULIN ASPART 100 UNIT/ML ~~LOC~~ SOLN
0.0000 [IU] | SUBCUTANEOUS | Status: DC
  Administered 2013-12-14: 3 [IU] via SUBCUTANEOUS
  Administered 2013-12-14: 7 [IU] via SUBCUTANEOUS
  Administered 2013-12-15: 4 [IU] via SUBCUTANEOUS
  Administered 2013-12-15: 3 [IU] via SUBCUTANEOUS
  Administered 2013-12-15: 4 [IU] via SUBCUTANEOUS
  Administered 2013-12-15: 3 [IU] via SUBCUTANEOUS

## 2013-12-14 MED ORDER — DEXTROSE-NACL 5-0.45 % IV SOLN
INTRAVENOUS | Status: DC
  Administered 2013-12-14 – 2013-12-15 (×3): via INTRAVENOUS

## 2013-12-14 MED ORDER — ROCURONIUM BROMIDE 100 MG/10ML IV SOLN
INTRAVENOUS | Status: DC | PRN
  Administered 2013-12-14 (×2): 10 mg via INTRAVENOUS
  Administered 2013-12-14: 50 mg via INTRAVENOUS
  Administered 2013-12-14: 10 mg via INTRAVENOUS

## 2013-12-14 MED ORDER — DIPHENHYDRAMINE HCL 50 MG/ML IJ SOLN
12.5000 mg | Freq: Four times a day (QID) | INTRAMUSCULAR | Status: DC | PRN
Start: 2013-12-14 — End: 2013-12-17

## 2013-12-14 MED ORDER — FENTANYL CITRATE 0.05 MG/ML IJ SOLN
INTRAMUSCULAR | Status: AC
  Filled 2013-12-14: qty 5

## 2013-12-14 MED ORDER — ACETAMINOPHEN 10 MG/ML IV SOLN
1000.0000 mg | Freq: Four times a day (QID) | INTRAVENOUS | Status: AC
  Administered 2013-12-14 – 2013-12-15 (×4): 1000 mg via INTRAVENOUS
  Filled 2013-12-14 (×4): qty 100

## 2013-12-14 MED ORDER — HYDROMORPHONE HCL PF 1 MG/ML IJ SOLN
INTRAMUSCULAR | Status: AC
  Filled 2013-12-14: qty 1

## 2013-12-14 MED ORDER — HYDROMORPHONE HCL PF 1 MG/ML IJ SOLN
INTRAMUSCULAR | Status: DC | PRN
  Administered 2013-12-14 (×2): 0.5 mg via INTRAVENOUS
  Administered 2013-12-14: 1 mg via INTRAVENOUS

## 2013-12-14 MED ORDER — BUPIVACAINE-EPINEPHRINE 0.25% -1:200000 IJ SOLN
INTRAMUSCULAR | Status: DC | PRN
  Administered 2013-12-14: 8 mL

## 2013-12-14 MED ORDER — CEFAZOLIN SODIUM-DEXTROSE 2-3 GM-% IV SOLR
INTRAVENOUS | Status: AC
  Filled 2013-12-14: qty 50

## 2013-12-14 MED ORDER — NEOSTIGMINE METHYLSULFATE 1 MG/ML IJ SOLN
INTRAMUSCULAR | Status: DC | PRN
  Administered 2013-12-14: 5 mg via INTRAVENOUS

## 2013-12-14 MED ORDER — HYDROMORPHONE HCL PF 1 MG/ML IJ SOLN
0.2500 mg | INTRAMUSCULAR | Status: DC | PRN
  Administered 2013-12-14 (×4): 0.5 mg via INTRAVENOUS

## 2013-12-14 MED ORDER — MIDAZOLAM HCL 5 MG/5ML IJ SOLN
INTRAMUSCULAR | Status: DC | PRN
  Administered 2013-12-14: 2 mg via INTRAVENOUS

## 2013-12-14 MED ORDER — SODIUM CHLORIDE 0.9 % IJ SOLN
INTRAMUSCULAR | Status: AC
  Filled 2013-12-14: qty 10

## 2013-12-14 MED ORDER — GLYCOPYRROLATE 0.2 MG/ML IJ SOLN
INTRAMUSCULAR | Status: DC | PRN
  Administered 2013-12-14: 0.6 mg via INTRAVENOUS

## 2013-12-14 MED ORDER — FENTANYL CITRATE 0.05 MG/ML IJ SOLN
INTRAMUSCULAR | Status: AC
  Filled 2013-12-14: qty 2

## 2013-12-14 MED ORDER — MIDAZOLAM HCL 2 MG/2ML IJ SOLN
INTRAMUSCULAR | Status: AC
  Filled 2013-12-14: qty 2

## 2013-12-14 MED ORDER — LACTATED RINGERS IR SOLN
Status: DC | PRN
  Administered 2013-12-14: 1000 mL

## 2013-12-14 MED ORDER — MANNITOL 25 % IV SOLN
25.0000 g | INTRAVENOUS | Status: AC
  Administered 2013-12-14 (×2): 12.5 g via INTRAVENOUS
  Filled 2013-12-14: qty 100

## 2013-12-14 MED ORDER — LEVOTHYROXINE SODIUM 175 MCG PO TABS
175.0000 ug | ORAL_TABLET | Freq: Every day | ORAL | Status: DC
  Administered 2013-12-15 – 2013-12-17 (×3): 175 ug via ORAL
  Filled 2013-12-14 (×4): qty 1

## 2013-12-14 MED ORDER — TOPIRAMATE 25 MG PO TABS
25.0000 mg | ORAL_TABLET | Freq: Two times a day (BID) | ORAL | Status: DC
  Administered 2013-12-14 – 2013-12-17 (×6): 25 mg via ORAL
  Filled 2013-12-14 (×8): qty 1

## 2013-12-14 MED ORDER — DOCUSATE SODIUM 100 MG PO CAPS
100.0000 mg | ORAL_CAPSULE | Freq: Two times a day (BID) | ORAL | Status: DC
  Administered 2013-12-14 – 2013-12-17 (×7): 100 mg via ORAL
  Filled 2013-12-14 (×9): qty 1

## 2013-12-14 MED ORDER — PROMETHAZINE HCL 25 MG/ML IJ SOLN: 6.2500 mg | INTRAMUSCULAR | Status: DC | PRN

## 2013-12-14 MED ORDER — DIPHENHYDRAMINE HCL 12.5 MG/5ML PO ELIX: 12.5000 mg | ORAL_SOLUTION | Freq: Four times a day (QID) | ORAL | Status: DC | PRN

## 2013-12-14 MED ORDER — PHENYLEPHRINE 40 MCG/ML (10ML) SYRINGE FOR IV PUSH (FOR BLOOD PRESSURE SUPPORT)
PREFILLED_SYRINGE | INTRAVENOUS | Status: AC
  Filled 2013-12-14: qty 10

## 2013-12-14 MED ORDER — NEOSTIGMINE METHYLSULFATE 1 MG/ML IJ SOLN
INTRAMUSCULAR | Status: AC
  Filled 2013-12-14: qty 10

## 2013-12-14 MED ORDER — EPHEDRINE SULFATE 50 MG/ML IJ SOLN
INTRAMUSCULAR | Status: DC | PRN
  Administered 2013-12-14 (×5): 10 mg via INTRAVENOUS

## 2013-12-14 MED ORDER — EPHEDRINE SULFATE 50 MG/ML IJ SOLN
INTRAMUSCULAR | Status: AC
  Filled 2013-12-14: qty 1

## 2013-12-14 MED ORDER — ALBUTEROL SULFATE HFA 108 (90 BASE) MCG/ACT IN AERS: 2.0000 | INHALATION_SPRAY | Freq: Four times a day (QID) | RESPIRATORY_TRACT | Status: DC | PRN

## 2013-12-14 MED ORDER — PROPOFOL 10 MG/ML IV BOLUS
INTRAVENOUS | Status: DC | PRN
  Administered 2013-12-14: 150 mg via INTRAVENOUS

## 2013-12-14 MED ORDER — LIDOCAINE HCL (CARDIAC) 20 MG/ML IV SOLN
INTRAVENOUS | Status: DC | PRN
  Administered 2013-12-14: 100 mg via INTRAVENOUS

## 2013-12-14 MED ORDER — SODIUM CHLORIDE 0.9 % IJ SOLN
INTRAMUSCULAR | Status: AC
  Filled 2013-12-14: qty 20

## 2013-12-14 MED ORDER — TRIAMTERENE-HCTZ 37.5-25 MG PO TABS
1.0000 | ORAL_TABLET | Freq: Two times a day (BID) | ORAL | Status: DC
  Administered 2013-12-14 – 2013-12-17 (×6): 1 via ORAL
  Filled 2013-12-14 (×8): qty 1

## 2013-12-14 MED ORDER — TRAZODONE HCL 50 MG PO TABS
75.0000 mg | ORAL_TABLET | Freq: Every day | ORAL | Status: DC
  Administered 2013-12-14 – 2013-12-16 (×3): 75 mg via ORAL
  Filled 2013-12-14 (×5): qty 1

## 2013-12-14 MED ORDER — BUPIVACAINE LIPOSOME 1.3 % IJ SUSP
20.0000 mL | Freq: Once | INTRAMUSCULAR | Status: DC
  Filled 2013-12-14: qty 20

## 2013-12-14 MED ORDER — ALPRAZOLAM 0.5 MG PO TABS: 0.5000 mg | ORAL_TABLET | Freq: Every day | ORAL | Status: DC | PRN

## 2013-12-14 SURGICAL SUPPLY — 66 items
APPLICATOR SURGIFLO ENDO (HEMOSTASIS) ×4 IMPLANT
CABLE HIGH FREQUENCY MONO STRZ (ELECTRODE) ×4 IMPLANT
CATH FOLEY LATEX FREE 16FR (CATHETERS) ×4 IMPLANT
CATH INTERMIT  6FR 70CM (CATHETERS) ×4 IMPLANT
CHLORAPREP W/TINT 26ML (MISCELLANEOUS) ×4 IMPLANT
CLIP LIGATING HEM O LOK PURPLE (MISCELLANEOUS) ×4 IMPLANT
CLIP LIGATING HEMO O LOK GREEN (MISCELLANEOUS) ×8 IMPLANT
CORDS BIPOLAR (ELECTRODE) ×4 IMPLANT
COVER SURGICAL LIGHT HANDLE (MISCELLANEOUS) ×4 IMPLANT
COVER TIP SHEARS 8 DVNC (MISCELLANEOUS) ×2 IMPLANT
COVER TIP SHEARS 8MM DA VINCI (MISCELLANEOUS) ×2
DECANTER SPIKE VIAL GLASS SM (MISCELLANEOUS) ×4 IMPLANT
DERMABOND ADVANCED (GAUZE/BANDAGES/DRESSINGS) ×4
DERMABOND ADVANCED .7 DNX12 (GAUZE/BANDAGES/DRESSINGS) ×4 IMPLANT
DRAIN CHANNEL 15F RND FF 3/16 (WOUND CARE) ×4 IMPLANT
DRAPE INCISE IOBAN 66X45 STRL (DRAPES) ×4 IMPLANT
DRAPE LAPAROSCOPIC ABDOMINAL (DRAPES) ×4 IMPLANT
DRAPE LG THREE QUARTER DISP (DRAPES) ×8 IMPLANT
DRAPE SLUSH/WARMER DISC (DRAPES) ×4 IMPLANT
DRAPE TABLE BACK 44X90 PK DISP (DRAPES) ×4 IMPLANT
DRAPE WARM FLUID 44X44 (DRAPE) ×4 IMPLANT
DRSG TEGADERM 4X4.75 (GAUZE/BANDAGES/DRESSINGS) ×4 IMPLANT
ELECT REM PT RETURN 9FT ADLT (ELECTROSURGICAL) ×4
ELECTRODE REM PT RTRN 9FT ADLT (ELECTROSURGICAL) ×2 IMPLANT
EVACUATOR SILICONE 100CC (DRAIN) ×4 IMPLANT
GLOVE BIO SURGEON STRL SZ 6.5 (GLOVE) ×3 IMPLANT
GLOVE BIO SURGEONS STRL SZ 6.5 (GLOVE) ×1
GLOVE BIOGEL M STRL SZ7.5 (GLOVE) IMPLANT
GLOVE BIOGEL PI IND STRL 7.0 (GLOVE) ×10 IMPLANT
GLOVE BIOGEL PI INDICATOR 7.0 (GLOVE) ×10
GLOVE SURG SS PI 7.0 STRL IVOR (GLOVE) ×20 IMPLANT
GLOVE SURG SS PI 7.5 STRL IVOR (GLOVE) ×16 IMPLANT
GOWN STRL NON-REIN LRG LVL3 (GOWN DISPOSABLE) IMPLANT
GOWN STRL REUS W/TWL XL LVL3 (GOWN DISPOSABLE) ×20 IMPLANT
GUIDEWIRE STR DUAL SENSOR (WIRE) ×4 IMPLANT
HEMOSTAT SURGICEL 4X8 (HEMOSTASIS) IMPLANT
KIT ACCESSORY DA VINCI DISP (KITS) ×2
KIT ACCESSORY DVNC DISP (KITS) ×2 IMPLANT
KIT BASIN OR (CUSTOM PROCEDURE TRAY) ×4 IMPLANT
PACK CYSTO (CUSTOM PROCEDURE TRAY) ×4 IMPLANT
PENCIL BUTTON HOLSTER BLD 10FT (ELECTRODE) ×4 IMPLANT
POSITIONER SURGICAL ARM (MISCELLANEOUS) ×8 IMPLANT
POUCH SPECIMEN RETRIEVAL 10MM (ENDOMECHANICALS) ×4 IMPLANT
SET TUBE IRRIG SUCTION NO TIP (IRRIGATION / IRRIGATOR) ×4 IMPLANT
SLEEVE SURGEON STRL (DRAPES) ×4 IMPLANT
SOLUTION ANTI FOG 6CC (MISCELLANEOUS) ×4 IMPLANT
SOLUTION ELECTROLUBE (MISCELLANEOUS) ×4 IMPLANT
SPONGE LAP 18X18 X RAY DECT (DISPOSABLE) IMPLANT
STENT CONTOUR 6FRX24X.038 (STENTS) ×4 IMPLANT
SURGIFLO W/THROMBIN 8M KIT (HEMOSTASIS) ×4 IMPLANT
SUT ETHILON 3 0 PS 1 (SUTURE) ×4 IMPLANT
SUT MNCRL AB 4-0 PS2 18 (SUTURE) ×8 IMPLANT
SUT V-LOC BARB 180 2/0GR6 GS22 (SUTURE) ×4
SUT VIC AB 0 CT1 27 (SUTURE) ×2
SUT VIC AB 0 CT1 27XBRD ANTBC (SUTURE) ×2 IMPLANT
SUT VICRYL 0 UR6 27IN ABS (SUTURE) ×16 IMPLANT
SUT VLOC BARB 180 ABS3/0GR12 (SUTURE) ×12
SUTURE V-LC BRB 180 2/0GR6GS22 (SUTURE) ×2 IMPLANT
SUTURE VLOC BRB 180 ABS3/0GR12 (SUTURE) ×6 IMPLANT
TOWEL OR 17X26 10 PK STRL BLUE (TOWEL DISPOSABLE) ×8 IMPLANT
TOWEL OR NON WOVEN STRL DISP B (DISPOSABLE) ×4 IMPLANT
TRAY FOLEY CATH 16FRSI W/METER (SET/KITS/TRAYS/PACK) IMPLANT
TRAY LAP CHOLE (CUSTOM PROCEDURE TRAY) ×4 IMPLANT
TROCAR XCEL 12X100 BLDLESS (ENDOMECHANICALS) ×4 IMPLANT
TUBING INSUFFLATION 10FT LAP (TUBING) ×4 IMPLANT
WATER STERILE IRR 1500ML POUR (IV SOLUTION) ×8 IMPLANT

## 2013-12-14 NOTE — Transfer of Care (Signed)
Immediate Anesthesia Transfer of Care Note  Patient: Joann [name]  Procedure(s) Performed: Procedure(s) (LRB): ROBOTIC ASSITED PARTIAL NEPHRECTOMY (Right) CYSTOSCOPY WITH RETROGRADE PYELOGRAM/URETERAL STENT PLACEMENT (Right)  Patient Location: PACU  Anesthesia Type: General  Level of Consciousness: sedated, patient cooperative and responds to stimulation  Airway & Oxygen Therapy: Patient Spontanous Breathing and Patient connected to face mask oxgen  Post-op Assessment: Report given to PACU RN and Post -op Vital signs reviewed and stable  Post vital signs: Reviewed and stable  Complications: No apparent anesthesia complications

## 2013-12-14 NOTE — Anesthesia Postprocedure Evaluation (Signed)
Anesthesia Post Note  Patient: Joann [name]  Procedure(s) Performed: Procedure(s) (LRB): ROBOTIC ASSITED PARTIAL NEPHRECTOMY (Right) CYSTOSCOPY WITH RETROGRADE PYELOGRAM/URETERAL STENT PLACEMENT (Right)  Anesthesia type: General  Patient location: PACU  Post pain: Pain level controlled  Post assessment: Post-op Vital signs reviewed  Last Vitals:  Filed Vitals:   12/14/13 1345  BP: 106/61  Pulse: 98  Temp: 37 C  Resp: 14    Post vital signs: Reviewed  Level of consciousness: sedated  Complications: No apparent anesthesia complications

## 2013-12-14 NOTE — Op Note (Signed)
Preoperative diagnosis: Right renal neoplasm  Postoperative diagnosis: Right renal neoplasm  Procedure:  1. Right robotic-assisted laparoscopic partial nephrectomy 2. Intraoperative renal ultrasonography 3. Cystoscopy 4. Right retrograde pyelography 5. Right ureteral stent (6 x 24)  Surgeon: Roxy Horseman, Brooke Bonito. M.D.  Assistant(s): Leta Baptist, PA-C  Anesthesia: General  Complications: None  EBL: 100 mL  IVF:  2200 mL crystalloid  Specimens: 1. Right renal neoplasm 2. Frozen section renal margin  Disposition of specimens: Pathology  Intraoperative findings:       1. Warm renal ischemia time: 41 minutes       2. Intraoperative renal ultrasound findings: There was a mostly solid and partially cystic 2.9 x 2.0 cm centrally located renal mass which was completely endophytic and located in the interpolar region of the kidney.       3. R RPG: Ominpaque contrast was injected with a 6 Fr ureteral catheter.  There was a normal caliber ureter without filling defects.  There were no renal collecting system filling defects.  Drains: 1. # 15 Blake perinephric drain  Indication:  Joann [name] is a 53 y.o. year old patient with a right renal neoplasm.  After a thorough review of the management options for their renal mass, they elected to proceed with surgical treatment and the above procedure.  We have discussed the potential benefits and risks of the procedure, side effects of the proposed treatment, the likelihood of the patient achieving the goals of the procedure, and any potential problems that might occur during the procedure or recuperation. Informed consent has been obtained.   Description of procedure:  The patient was taken to the operating room and a general anesthetic was administered. The patient was given preoperative antibiotics, placed in the dorsal lithotomy position, and prepped and draped in the usual sterile fashion. Next a preoperative timeout was  performed.  Considering the complex nature and central location of her mass, it was decided to proceed with cystoscopy, retrograde pyelography, and ureteral stent placement both to confirm the diagnosis and to facilitate postoperative recovery.  Cystourethroscopy was performed which revealed normal urethra.  Inspection of the bladder was then performed systematically revealed no bladder tumors, stones, or other mucosal pathology.Ureteral orifices were seen in their expected anatomic locations.  Attention then turned to the right ureteral orifice and a 6 French ureteral catheter was used to intubate the orifice.  Omnipaque contrast was injected with findings as dictated above.  No intrinsic masses or filling defects were identified to suggest a urothelial tumor.  A 0.38 sensor guidewire was then inserted up into the renal pelvis under 4 scopic guidance.  A 6 x 24 double-J ureteral stent was then advanced over the wire using Seldinger technique and positioned appropriately under fluoroscopic and cystoscopic guidance.  A 16 French Foley catheter was inserted into the bladder.  The patient was then repositioned in the right modified flank position and prepped and draped in the usual sterile fashion.  A site was selected in the midline for placement of the initial camera port. This was placed using a standard open Hassan technique which allowed entry into the peritoneal cavity under direct vision and without difficulty. A 12 mm port was placed and a pneumoperitoneum established. The camera was then used to inspect the abdomen and there was no evidence of any intra-abdominal injuries or other abnormalities. The remaining abdominal ports were then placed. 8 mm robotic ports were placed in the right upper quadrant, right lower quadrant, and far right lateral abdominal wall.  A 12 mm port was placed in the right upper quadrant for the final camera port. All ports were placed under direct vision without difficulty. The  surgical cart was then docked.   Utilizing the cautery scissors, the white line of Toldt was incised allowing the colon to be mobilized medially and the plane between the mesocolon and the anterior layer of Gerota's fascia to be developed and the kidney to be exposed.  The ureter and gonadal vein were identified inferiorly and the ureter was lifted anteriorly off the psoas muscle.   Dissection proceeded superiorly along the gonadal vein until the renal vein was identified. The gonadal vein was ligated with multiple Weck clips and divided. The renal hilum was then carefully isolated with a combination of blunt and sharp dissection allowing the renal arterial and venous structures to be separated and isolated in preparation for renal hilar vessel clamping.  12.5 g of IV mannitol was then administered.   Attention turned to the kidney and the perinephric fat surrounding the kidney was removed and the kidney was mobilized sufficiently for exposure and resection of the renal mass. This required complete mobilization of the kidney.  Intraoperative renal ultrasonography was utilized with the laparoscopic ultrasound probe to identify the renal tumor and identify the tumor margins. The mass was noted measuring approximately 2.9 x 2.0 cm and appeared mostly solid with some cystic components.  Margins were carefully marked along the kidney using ultrasound in preparation for excision.  The mass was not able to be visualized directly as it was completely endophytic.   Preparations were made for resection of the tumor.  Reconstructive sutures were placed into the abdomen for the renorrhaphy portion of the procedure.  The renal artery was then clamped with two bulldog clamps and the renal vein was also clamped.  The tumor was then excised using the previously marked margins with cold scissor dissection. The tumor was not grossly visualized and there only appeared to be normal parenchyma. The tumor appeared to be excised  without any gross violation of the tumor. A deep renal margin was sent for frozen section and was negative for malignancy. The renal collecting system was entered during removal of the tumor in multiple places.  Multiple 3-0 V-lock sutures were then brought through the capsule of the kidney and run along the base of the renal defect to provide hemostasis and close any entry into the renal collecting system. Weck clips were used to secure this suture outside the renal capsule at the proximal and distal ends. An additional hemostatic agent (Surgiflo) was then placed into the renal defect. A running 2-0 V lock suture was then used to close the capsule of the kidney using a sliding clip technique which resulted in excellent hemostasis.    The bulldog clamps were then removed from the renal hilar vessel(s) and an additional 12.5 g of IV mannitol was administered. Total warm renal ischemia time was 41 minutes. The renal tumor resection site was examined. Hemostasis appeared adequate.   The kidney was placed back into its normal anatomic position and covered with perinephric fat as needed.  A # 15 Blake drain was then brought through the lateral lower port site and positioned in the perinephric space.  It was secured to the skin with a nylon suture. The surgical cart was undocked.  The renal tumor specimen was removed intact within an endopouch retrieval bag via the midline incision.  The camera port site and the other 12 mm port site were  then closed at the fascial level with 0-vicryl suture.  All other laparoscopic/robotic ports were removed under direct vision and the pneumoperitoneum let down with inspection of the operative field performed and hemostasis again confirmed. All incision sites were then injected with local anesthetic and reapproximated at the skin level with 4-0 monocryl subcuticular closures.  Dermabond was applied to the skin.  The patient tolerated the procedure well and without complications.  The  patient was able to be extubated and transferred to the recovery unit in satisfactory condition.  Pryor Curia MD

## 2013-12-14 NOTE — Progress Notes (Signed)
Patient ID: Joann [name], female   DOB: [birth year], 53 y.o.   MRN: [id number] Post-op note  Subjective: The patient is doing well.  No complaints.  Sleepy.  Denies N/V  Objective: Vital signs in last 24 hours: Temp:  [98 F (36.7 C)-98.7 F (37.1 C)] 98.6 F (37 C) (02/05 1345) Pulse Rate:  [88-109] 98 (02/05 1345) Resp:  [14-21] 14 (02/05 1345) BP: (106-124)/(50-64) 106/61 mmHg (02/05 1345) SpO2:  [96 %-99 %] 98 % (02/05 1345) Weight:  [84.369 kg (186 lb)] 84.369 kg (186 lb) (02/05 1345)  Intake/Output from previous day:   Intake/Output this shift: Total I/O In: 2700 [I.V.:2700] Out: 650 [Urine:475; Drains:75; Blood:100]  Physical Exam:  General: Alert and oriented. Abdomen: Soft, Nondistended. Incisions: Clean and dry.  Lab Results:  Recent Labs  12/14/13 1155  HGB 12.3  HCT 36.6    Assessment/Plan: POD#0   1) Continue to monitor  2) DVT prophy, pain control, clears, IS, bed rest    LOS: 0 days   Marcie Bal. 12/14/2013, 3:43 PM

## 2013-12-14 NOTE — Discharge Instructions (Signed)
1.  Activity:  You are encouraged to ambulate frequently (about every hour during waking hours) to help prevent blood clots from forming in your legs or lungs.  However, you should not engage in any heavy lifting (> 10-15 lbs), strenuous activity, or straining. °2. Diet: You should advance your diet as instructed by your physician.  It will be normal to have some bloating, nausea, and abdominal discomfort intermittently. °3. Prescriptions:  You will be provided a prescription for pain medication to take as needed.  If your pain is not severe enough to require the prescription pain medication, you may take extra strength Tylenol instead which will have less side effects.  You should also take a prescribed stool softener to avoid straining with bowel movements as the prescription pain medication may constipate you. °4. Incisions: You may remove your dressing bandages 48 hours after surgery if not removed in the hospital.  You will either have some small staples or special tissue glue at each of the incision sites. Once the bandages are removed (if present), the incisions may stay open to air.  You may start showering (but not soaking or bathing in water) the 2nd day after surgery and the incisions simply need to be patted dry after the shower.  No additional care is needed. °5. What to call us about: You should call the office ([phone]) if you develop fever > 101 or develop persistent vomiting. ° °You may resume aspirin, vitamins, and supplements 7 days after surgery. °

## 2013-12-14 NOTE — Preoperative (Signed)
Beta Blockers   Reason not to administer Beta Blockers:Not Applicable 

## 2013-12-14 NOTE — Interval H&P Note (Signed)
History and Physical Interval Note:  12/14/2013 6:56 AM  Joann [name]  has presented today for surgery, with the diagnosis of RIGHT RENAL MASS  The various methods of treatment have been discussed with the patient and family. After consideration of risks, benefits and other options for treatment, the patient has consented to  Procedure(s): ROBOTIC ASSITED PARTIAL NEPHRECTOMY (Right) CYSTOSCOPY WITH RETROGRADE PYELOGRAM/URETERAL STENT PLACEMENT (Right) as a surgical intervention .  The patient's history has been reviewed, patient examined, no change in status, stable for surgery.  I have reviewed the patient's chart and labs.  Questions were answered to the patient's satisfaction.     [name],LES

## 2013-12-15 ENCOUNTER — Encounter (HOSPITAL_COMMUNITY): Payer: Self-pay | Admitting: Urology

## 2013-12-15 LAB — GLUCOSE, CAPILLARY
GLUCOSE-CAPILLARY: 231 mg/dL — AB (ref 70–99)
Glucose-Capillary: 132 mg/dL — ABNORMAL HIGH (ref 70–99)
Glucose-Capillary: 140 mg/dL — ABNORMAL HIGH (ref 70–99)
Glucose-Capillary: 140 mg/dL — ABNORMAL HIGH (ref 70–99)
Glucose-Capillary: 148 mg/dL — ABNORMAL HIGH (ref 70–99)
Glucose-Capillary: 174 mg/dL — ABNORMAL HIGH (ref 70–99)
Glucose-Capillary: 182 mg/dL — ABNORMAL HIGH (ref 70–99)

## 2013-12-15 LAB — BASIC METABOLIC PANEL WITH GFR
BUN: 9 mg/dL (ref 6–23)
CO2: 25 meq/L (ref 19–32)
Calcium: 8.2 mg/dL — ABNORMAL LOW (ref 8.4–10.5)
Chloride: 96 meq/L (ref 96–112)
Creatinine, Ser: 1.35 mg/dL — ABNORMAL HIGH (ref 0.50–1.10)
GFR calc Af Amer: 51 mL/min — ABNORMAL LOW
GFR calc non Af Amer: 44 mL/min — ABNORMAL LOW
Glucose, Bld: 202 mg/dL — ABNORMAL HIGH (ref 70–99)
Potassium: 4.2 meq/L (ref 3.7–5.3)
Sodium: 133 meq/L — ABNORMAL LOW (ref 137–147)

## 2013-12-15 LAB — HEMOGLOBIN AND HEMATOCRIT, BLOOD
HCT: 34.1 % — ABNORMAL LOW (ref 36.0–46.0)
Hemoglobin: 11.5 g/dL — ABNORMAL LOW (ref 12.0–15.0)

## 2013-12-15 MED ORDER — INSULIN GLARGINE 100 UNIT/ML ~~LOC~~ SOLN
25.0000 [IU] | Freq: Every day | SUBCUTANEOUS | Status: DC
Start: 2013-12-15 — End: 2013-12-17
  Filled 2013-12-15 (×3): qty 0.25

## 2013-12-15 MED ORDER — MORPHINE SULFATE 2 MG/ML IJ SOLN
2.0000 mg | INTRAMUSCULAR | Status: DC | PRN
  Administered 2013-12-15 (×2): 2 mg via INTRAVENOUS
  Administered 2013-12-15 (×2): 4 mg via INTRAVENOUS
  Administered 2013-12-16 (×2): 2 mg via INTRAVENOUS
  Filled 2013-12-15: qty 2
  Filled 2013-12-15 (×2): qty 1
  Filled 2013-12-15: qty 2
  Filled 2013-12-15 (×2): qty 1

## 2013-12-15 MED ORDER — BISACODYL 10 MG RE SUPP
10.0000 mg | Freq: Once | RECTAL | Status: AC
  Administered 2013-12-15: 10 mg via RECTAL
  Filled 2013-12-15: qty 1

## 2013-12-15 MED ORDER — INSULIN GLARGINE 100 UNIT/ML ~~LOC~~ SOLN
25.0000 [IU] | Freq: Every day | SUBCUTANEOUS | Status: DC
  Administered 2013-12-15 – 2013-12-16 (×2): 25 [IU] via SUBCUTANEOUS
  Filled 2013-12-15 (×3): qty 0.25

## 2013-12-15 MED ORDER — SODIUM CHLORIDE 0.45 % IV SOLN
INTRAVENOUS | Status: DC
  Administered 2013-12-15: 1000 mL via INTRAVENOUS
  Administered 2013-12-15: 22:00:00 via INTRAVENOUS

## 2013-12-15 MED ORDER — INSULIN ASPART 100 UNIT/ML ~~LOC~~ SOLN
0.0000 [IU] | Freq: Three times a day (TID) | SUBCUTANEOUS | Status: DC
Start: 2013-12-15 — End: 2013-12-17
  Administered 2013-12-15: 1 [IU] via SUBCUTANEOUS
  Administered 2013-12-16: 2 [IU] via SUBCUTANEOUS
  Administered 2013-12-16 (×2): 1 [IU] via SUBCUTANEOUS
  Administered 2013-12-17: 2 [IU] via SUBCUTANEOUS

## 2013-12-15 MED ORDER — INSULIN ASPART 100 UNIT/ML ~~LOC~~ SOLN: 0.0000 [IU] | Freq: Three times a day (TID) | SUBCUTANEOUS | Status: DC

## 2013-12-15 MED ORDER — HYDROCODONE-ACETAMINOPHEN 10-325 MG PO TABS: 1.0000 | ORAL_TABLET | Freq: Four times a day (QID) | ORAL | Status: DC | PRN

## 2013-12-15 MED ORDER — BELLADONNA ALKALOIDS-OPIUM 16.2-60 MG RE SUPP
1.0000 | Freq: Four times a day (QID) | RECTAL | Status: DC | PRN
  Administered 2013-12-15 (×2): 1 via RECTAL
  Filled 2013-12-15 (×2): qty 1

## 2013-12-15 MED ORDER — HYDROCODONE-ACETAMINOPHEN 5-325 MG PO TABS
1.0000 | ORAL_TABLET | Freq: Four times a day (QID) | ORAL | Status: DC | PRN
  Administered 2013-12-15 – 2013-12-16 (×4): 2 via ORAL
  Administered 2013-12-16: 1 via ORAL
  Administered 2013-12-17 (×2): 2 via ORAL
  Filled 2013-12-15 (×8): qty 2

## 2013-12-15 NOTE — Progress Notes (Signed)
Patient ID: Joann [name], female   DOB: Nov 27, 1960, 53 y.o.   MRN: [id number]  Pt ambulated but not very well today due to pain and bladder spasms. She is tolerating her diet. No flatus yet.   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 - Continue ambulation - Will remove urethral catheter and follow drain output and check drain creatinine level in am.  If evidence for urine leak (which is a risk considering her deep resection and collecting system violation), will simply need to replace urethral catheter at that time. - D/C tomorrow or more likely Sunday based on progress up to this point

## 2013-12-15 NOTE — Progress Notes (Signed)
Spoke with Lorenda Peck, Inpatient Diabetes Coordinator regarding her recommendations for patient's diabetes management while in the hospital now that patient's diet has been advanced to carb mod/in combination with patient's renal issues.  Verified changes with Leta Baptist, PA.  Have changed CBG monitoring and novolog administration to [facility], modified insulin orders from resistant scale to sensitive, and added 25 units of lantus at bedtime per Rhonda's suggestions and with verbal order from [place].

## 2013-12-15 NOTE — Progress Notes (Signed)
Patient ID: Natlie Asfour, female   DOB: 10-15-1961, 53 y.o.   MRN: [id number]  1 Day Post-Op Subjective: No complaints. No nausea or vomiting. Tolerating liquids.  No flatus.  Objective: Vital signs in last 24 hours: Temp:  [97.5 F (36.4 C)-98.7 F (37.1 C)] 97.6 F (36.4 C) (02/06 0649) Pulse Rate:  [85-109] 87 (02/06 0649) Resp:  [14-21] 20 (02/06 0649) BP: (97-124)/(50-63) 104/61 mmHg (02/06 0649) SpO2:  [93 %-99 %] 95 % (02/06 0649) Weight:  [84.369 kg (186 lb)] 84.369 kg (186 lb) (02/05 1345)  Intake/Output from previous day: 02/05 0701 - 02/06 0700 In: 3300 [I.V.:3300] Out: 2942 [Urine:2625; Drains:217; Blood:100] Intake/Output this shift:    Physical Exam:  General: Alert and oriented CV: RRR Lungs: Clear Abdomen: Soft, ND Incisions: C/D/I Ext: NT, No erythema  Lab Results:  Recent Labs  12/14/13 1155 12/15/13 0430  HGB 12.3 11.5*  HCT 36.6 34.1*   BMET  Recent Labs  12/14/13 1155 12/15/13 0430  NA 134* 133*  K 4.5 4.2  CL 97 96  CO2 26 25  GLUCOSE 144* 202*  BUN 11 9  CREATININE 1.17* 1.35*  CALCIUM 8.5 8.2*     Assessment/Plan: POD #1 s/p right RAL partial nephrectomy - Ambulate and IS - Transition to oral pain medication - Advance diet as tolerated - Monitor renal function, Hgb - Considering very deep resection into kidney with multiple entries into collecting system, pt had stent placed and I will plan to leave her catheter right now to facilitate healing   LOS: 1 day   Meliyah Simon,LES 12/15/2013, 7:35 AM

## 2013-12-15 NOTE — Progress Notes (Signed)
Patient ambulated outside of room to hallway.  Felt intense pain, stated she would like to try to use the bathroom.  Stated she felt her bladder was full.  Foley draining well, urine pinkish-yellow.  Have administered B&O suppository per urology PRN orders. Will continue to monitor patient.

## 2013-12-15 NOTE — Care Management Note (Signed)
    Page 1 of 1   12/15/2013     11:39:50 AM   CARE MANAGEMENT NOTE 12/15/2013  Patient:  Joann [name], Joann [name]   Account Number:  [id number]  Date Initiated:  12/15/2013  Documentation initiated by:  Dessa Phi  Subjective/Objective Assessment:   53 Y/O F ADMITTED W/R RENAL MASS.     Action/Plan:   FROM HOME.HAS PCP,PHARMACY.   Anticipated DC Date:  12/15/2013   Anticipated DC Plan:  [place]  CM consult      Choice offered to / List presented to:             Status of service:  In process, will continue to follow Medicare Important Message given?   (If response is "NO", the following Medicare IM given date fields will be blank) Date Medicare IM given:   Date Additional Medicare IM given:    Discharge Disposition:    Per UR Regulation:  Reviewed for med. necessity/level of care/duration of stay  If discussed at [place] of Stay Meetings, dates discussed:    Comments:  12/15/13 Lamichael Youkhana RN,BSN NCM [phone] POD#1 R LAP [place].NO ANTICIPATED D/C NEEDS.

## 2013-12-15 NOTE — Progress Notes (Signed)
Inpatient Diabetes Program Recommendations  AACE/ADA: New Consensus Statement on Inpatient Glycemic Control (2013)  Target Ranges:  Prepandial:   less than 140 mg/dL      Peak postprandial:   less than 180 mg/dL (1-2 hours)      Critically ill patients:  140 - 180 mg/dL   Reason for Visit: Hyperglycemia  Results for NEDDA, GAINS (MRN [id number]) as of 12/15/2013 15:11  Ref. Range 12/14/2013 20:43 12/14/2013 23:39 12/15/2013 04:17 12/15/2013 07:39 12/15/2013 11:51  Glucose-Capillary Latest Range: 70-99 mg/dL 231 (H) 174 (H) 182 (H) 148 (H) 132 (H)    Inpatient Diabetes Program Recommendations Insulin - Basal: Add 1/2 home dose Lantus - 25 units QHS Correction (SSI): Novolog sensitive Q4 hours and when eating, tidwc and hs Diet: When advanced, CHO mod med  Note: Will follow. Thank you. Lorenda Peck, RD, LDN, CDE Inpatient Diabetes Coordinator [phone]

## 2013-12-16 LAB — GLUCOSE, CAPILLARY
GLUCOSE-CAPILLARY: 137 mg/dL — AB (ref 70–99)
GLUCOSE-CAPILLARY: 176 mg/dL — AB (ref 70–99)
Glucose-Capillary: 147 mg/dL — ABNORMAL HIGH (ref 70–99)
Glucose-Capillary: 156 mg/dL — ABNORMAL HIGH (ref 70–99)

## 2013-12-16 LAB — CREATININE, FLUID (PLEURAL, PERITONEAL, JP DRAINAGE): Creat, Fluid: 1.4 mg/dL

## 2013-12-16 LAB — BASIC METABOLIC PANEL
BUN: 11 mg/dL (ref 6–23)
CALCIUM: 8.7 mg/dL (ref 8.4–10.5)
CO2: 26 meq/L (ref 19–32)
CREATININE: 1.29 mg/dL — AB (ref 0.50–1.10)
Chloride: 96 mEq/L (ref 96–112)
GFR calc Af Amer: 54 mL/min — ABNORMAL LOW (ref >90)
GFR, EST NON AFRICAN AMERICAN: 46 mL/min — AB (ref >90)
Glucose, Bld: 168 mg/dL — ABNORMAL HIGH (ref 70–99)
Potassium: 3.9 mEq/L (ref 3.7–5.3)
Sodium: 134 mEq/L — ABNORMAL LOW (ref 137–147)

## 2013-12-16 LAB — HEMOGLOBIN AND HEMATOCRIT, BLOOD
HCT: 34.9 % — ABNORMAL LOW (ref 36.0–46.0)
HEMOGLOBIN: 11.5 g/dL — AB (ref 12.0–15.0)

## 2013-12-16 MED ORDER — ALBUTEROL SULFATE (2.5 MG/3ML) 0.083% IN NEBU: 2.5000 mg | INHALATION_SOLUTION | Freq: Four times a day (QID) | RESPIRATORY_TRACT | Status: DC | PRN

## 2013-12-16 NOTE — Progress Notes (Signed)
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 Required collecting system reconstruction. Foley removed 2/6.   Today Joann [name] is w/o complaints. Pain controlled, ambulatory. JP output remains low after foley removal.   Objective: Vital signs in last 24 hours: Temp:  [98.2 F (36.8 C)-100 F (37.8 C)] 98.2 F (36.8 C) (02/07 0634) Pulse Rate:  [107-113] 109 (02/07 0634) Resp:  [18-20] 20 (02/07 0634) BP: (116-123)/(64-69) 121/68 mmHg (02/07 0634) SpO2:  [90 %-92 %] 90 % (02/07 0634) Last BM Date: 12/13/13  Intake/Output from previous day: 02/06 0701 - 02/07 0700 In: 2887.5 [P.O.:780; I.V.:2107.5] Out: 2053 [Urine:1950; Drains:103] Intake/Output this shift: Total I/O In: -  Out: 450 [Urine:450]  General appearance: alert, cooperative and appears stated age Head: Normocephalic, without obvious abnormality, atraumatic Eyes: conjunctivae/corneas clear. PERRL, EOM's intact. Fundi benign. Ears: normal TM's and external ear canals both ears Nose: Nares normal. Septum midline. Mucosa normal. No drainage or sinus tenderness. Throat: lips, mucosa, and tongue normal; teeth and gums normal Neck: no adenopathy, no carotid bruit, no JVD, supple, symmetrical, trachea midline and thyroid not enlarged, symmetric, no tenderness/mass/nodules Back: symmetric, no curvature. ROM normal. No CVA tenderness. Resp: clear to auscultation bilaterally Chest wall: no tenderness Cardio: regular rate and rhythm, S1, S2 normal, no murmur, click, rub or gallop GI: soft, non-tender; bowel sounds normal; no masses,  no organomegaly Pelvic: external genitalia normal Extremities: extremities normal, atraumatic, no cyanosis or edema Pulses: 2+ and symmetric Skin: Skin color, texture, turgor normal. No rashes or lesions Lymph nodes: Cervical, supraclavicular, and axillary nodes  normal. Neurologic: Grossly normal Incision/Wound: Recent port / extraction sites c/d/i. JP with scan serousanguinous output.   Lab Results:   Recent Labs  12/15/13 0430 12/16/13 0455  HGB 11.5* 11.5*  HCT 34.1* 34.9*   BMET  Recent Labs  12/15/13 0430 12/16/13 0455  NA 133* 134*  K 4.2 3.9  CL 96 96  CO2 25 26  GLUCOSE 202* 168*  BUN 9 11  CREATININE 1.35* 1.29*  CALCIUM 8.2* 8.7   PT/INR No results found for this basename: LABPROT, INR,  in the last 72 hours ABG No results found for this basename: PHART, PCO2, PO2, HCO3,  in the last 72 hours  Studies/Results: No results found.  Anti-infectives: Anti-infectives   Start     Dose/Rate Route Frequency Ordered Stop   12/14/13 1500  ceFAZolin (ANCEF) IVPB 1 g/50 mL premix     1 g 100 mL/hr over 30 Minutes Intravenous Every 8 hours 12/14/13 1422 12/14/13 2331   12/14/13 0546  ceFAZolin (ANCEF) IVPB 2 g/50 mL premix     2 g 100 mL/hr over 30 Minutes Intravenous 30 min pre-op 12/14/13 0546 12/14/13 0728      Assessment/Plan:  1 - Rt Renal Cell Carcinoma - Doing very well POD 2. Will remain in house to verify no sig change to JP output and verify JP Cr same as serum before DC home, likely tomorrow. SLIV, ambulate.   [facility], Debara Kamphuis 12/16/2013

## 2013-12-17 LAB — GLUCOSE, CAPILLARY
Glucose-Capillary: 164 mg/dL — ABNORMAL HIGH (ref 70–99)
Glucose-Capillary: 163 mg/dL — ABNORMAL HIGH (ref 70–99)

## 2013-12-17 LAB — HEMOGLOBIN AND HEMATOCRIT, BLOOD
HCT: 33.1 % — ABNORMAL LOW (ref 36.0–46.0)
Hemoglobin: 11 g/dL — ABNORMAL LOW (ref 12.0–15.0)

## 2013-12-17 NOTE — Discharge Summary (Signed)
Physician Discharge Summary  Patient ID: Joann [name] MRN: [id number] DOB/AGE: 1960/12/30 53 y.o.  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 Required collecting system reconstruction. Foley removed 2/6.  JP Cr same as serum at 1.4 on 2/7. By 2/8, the day of discharge, tolerating regular diet, voiding to completion, pain controlled JP removed as output scant, and felt to be adequate fir discharge.   Consults: None  Significant Diagnostic Studies: labs: Path as per above. JP Cr as per above.   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  Discharge Exam: Blood pressure 114/71, pulse 103, temperature 98.7 F (37.1 C), temperature source Oral, resp. rate 18, height 5' (1.524 m), weight 84.369 kg (186 lb), SpO2 93.00%. General appearance: alert, cooperative, appears stated age and playinig on tablet PC Head: Normocephalic, without obvious abnormality, atraumatic Eyes: conjunctivae/corneas clear. PERRL, EOM's intact. Fundi benign., weating glasses Ears: normal TM's and external ear canals both ears Nose: Nares normal. Septum midline. Mucosa normal. No drainage or sinus tenderness. Throat: lips, mucosa, and tongue normal; teeth and gums normal Neck: no adenopathy, no carotid bruit, no JVD, supple, symmetrical, trachea midline and thyroid not enlarged, symmetric, no tenderness/mass/nodules Back: symmetric, no curvature. ROM normal. No CVA tenderness. Resp: clear to auscultation bilaterally Chest wall: no tenderness Breasts: normal appearance, no masses or  tenderness Cardio: regular rate and rhythm, S1, S2 normal, no murmur, click, rub or gallop GI: soft, non-tender; bowel sounds normal; no masses,  no organomegaly and obese. JP removed and dry dressing applied.  Extremities: extremities normal, atraumatic, no cyanosis or edema Pulses: 2+ and symmetric Skin: Skin color, texture, turgor normal. No rashes or lesions Lymph nodes: Cervical, supraclavicular, and axillary nodes normal. Neurologic: Grossly normal Incision/Wound: Recent port sites / extraction sties c/d/i. No hernias or drainage.   Disposition: 01-Home or Self Care     Medication List    STOP taking these medications       cholecalciferol 1000 UNITS tablet  Commonly known as:  VITAMIN D     vitamin E 400 UNIT capsule      TAKE these medications       albuterol 108 (90 BASE) MCG/ACT inhaler  Commonly known as:  PROVENTIL HFA;VENTOLIN HFA  Inhale 2 puffs into the lungs every 6 (six) hours as needed. For shortness of breath     ALPRAZolam 0.5 MG tablet  Commonly known as:  XANAX  Take 0.5 mg by mouth daily as needed for anxiety.     buPROPion 100 MG tablet  Commonly known as:  WELLBUTRIN  Take 100 mg by mouth 2 (two) times daily.     esomeprazole 40 MG capsule  Commonly known as:  NEXIUM  Take 40 mg by mouth 2 (two) times daily before a meal.     estrogens (conjugated) 0.625 MG tablet  Commonly known as:  PREMARIN  Take 0.625 mg by mouth daily.     FARXIGA 5 MG Tabs  Generic drug:  Dapagliflozin Propanediol  Take 5 mg by mouth every morning.     Fluticasone-Salmeterol 100-50 MCG/DOSE Aepb  Commonly known as:  ADVAIR  Inhale 1 puff into the lungs every 12 (twelve) hours.     HYDROcodone-acetaminophen 10-325 MG per tablet  Commonly known as:  NORCO  Take 1 tablet by mouth every 6 (six) hours as needed for moderate pain.     insulin aspart 100 UNIT/ML injection  Commonly known as:  novoLOG  Inject 15 Units into the skin daily as needed for high blood sugar  (with largestmeal if sugar is over 200).     insulin glargine 100 UNIT/ML injection  Commonly known as:  LANTUS  Inject 50 Units into the skin at bedtime.     levothyroxine 175 MCG tablet  Commonly known as:  SYNTHROID, LEVOTHROID  Take 175 mcg by mouth daily before breakfast.     lisinopril 20 MG tablet  Commonly known as:  PRINIVIL,ZESTRIL  Take 20 mg by mouth every morning.     metFORMIN 500 MG 24 hr tablet  Commonly known as:  GLUCOPHAGE-XR  Take 1,000 mg by mouth 2 (two) times daily.     potassium chloride 10 MEQ tablet  Commonly known as:  K-DUR  Take 10 mEq by mouth 2 (two) times daily.     pravastatin 40 MG tablet  Commonly known as:  PRAVACHOL  Take 40 mg by mouth every Monday, Wednesday, and Friday.     solifenacin 10 MG tablet  Commonly known as:  VESICARE  Take 10 mg by mouth daily.     topiramate 25 MG tablet  Commonly known as:  TOPAMAX  Take 25 mg by mouth 2 (two) times daily.     traZODone 150 MG tablet  Commonly known as:  DESYREL  Take 75 mg by mouth at bedtime.     triamterene-hydrochlorothiazide 37.5-25 MG per tablet  Commonly known as:  MAXZIDE-25  Take 1 tablet by mouth 2 (two) times daily.           Follow-up Information   Follow up with Dutch Gray, MD On 12/28/2013. (at 2:45)    Specialty:  Urology   Contact information:   [facility]  [place] [address] [phone]       Signed: Alexis Frock 12/17/2013, 8:55 AM

## 2014-01-07 ENCOUNTER — Ambulatory Visit: Payer: Self-pay | Admitting: Internal Medicine

## 2014-01-08 ENCOUNTER — Ambulatory Visit: Payer: Self-pay | Admitting: Internal Medicine

## 2014-02-07 ENCOUNTER — Ambulatory Visit: Payer: Self-pay | Admitting: Internal Medicine

## 2014-05-08 ENCOUNTER — Emergency Department (HOSPITAL_COMMUNITY)
Admission: EM | Admit: 2014-05-08 | Discharge: 2014-05-08 | Disposition: A | Discharge status: Home / Self Care | Payer: 59 | Source: Home / Self Care | Attending: Family Medicine | Admitting: Family Medicine

## 2014-05-08 ENCOUNTER — Encounter (HOSPITAL_COMMUNITY): Payer: Self-pay | Admitting: Emergency Medicine

## 2014-05-08 DIAGNOSIS — J069 Acute upper respiratory infection, unspecified: Secondary | ICD-10-CM

## 2014-05-08 HISTORY — DX: Malignant (primary) neoplasm, unspecified: C80.1

## 2014-05-08 LAB — POCT RAPID STREP A: Streptococcus, Group A Screen (Direct): NEGATIVE

## 2014-05-08 MED ORDER — IBUPROFEN 800 MG PO TABS
ORAL_TABLET | ORAL | Status: AC
Start: 2014-05-08 — End: 2014-05-08
  Filled 2014-05-08: qty 1

## 2014-05-08 MED ORDER — BENZONATATE 100 MG PO CAPS: 100.0000 mg | ORAL_CAPSULE | Freq: Three times a day (TID) | ORAL | Status: DC | PRN

## 2014-05-08 MED ORDER — IBUPROFEN 800 MG PO TABS
800.0000 mg | ORAL_TABLET | Freq: Once | ORAL | Status: AC
  Administered 2014-05-08: 800 mg via ORAL

## 2014-05-08 NOTE — ED Notes (Signed)
C/o headache x 3 days, but does not know if it is migraine, sinus or tension h/a.  Hx. Migraines and has been taking Topamax everyday.  C/o ear pressure bil., coughing up yellow sputum  X 4 weeks and scratchy throat.  Had bronchitis 4 weeks ago.

## 2014-05-08 NOTE — Discharge Instructions (Signed)
Your test for strep throat was negative and your exam was consistent with a common cold. I would recommend that you take either tylenol or ibuprofen as directed on packaging for headache and may sure you drink plenty of fluids. May use tessalon as prescribed for cough. Please follow up with your doctor if symptoms do not begin to improve over the next several days.   Cough, Adult  A cough is a reflex that helps clear your throat and airways. It can help heal the body or may be a reaction to an irritated airway. A cough may only last 2 or 3 weeks (acute) or may last more than 8 weeks (chronic).  CAUSES Acute cough:  Viral or bacterial infections. Chronic cough:  Infections.  Allergies.  Asthma.  Post-nasal drip.  Smoking.  Heartburn or acid reflux.  Some medicines.  Chronic lung problems (COPD).  Cancer. SYMPTOMS   Cough.  Fever.  Chest pain.  Increased breathing rate.  High-pitched whistling sound when breathing (wheezing).  Colored mucus that you cough up (sputum). TREATMENT   A bacterial cough may be treated with antibiotic medicine.  A viral cough must run its course and will not respond to antibiotics.  Your caregiver may recommend other treatments if you have a chronic cough. HOME CARE INSTRUCTIONS   Only take over-the-counter or prescription medicines for pain, discomfort, or fever as directed by your caregiver. Use cough suppressants only as directed by your caregiver.  Use a cold steam vaporizer or humidifier in your bedroom or home to help loosen secretions.  Sleep in a semi-upright position if your cough is worse at night.  Rest as needed.  Stop smoking if you smoke. SEEK IMMEDIATE MEDICAL CARE IF:   You have pus in your sputum.  Your cough starts to worsen.  You cannot control your cough with suppressants and are losing sleep.  You begin coughing up blood.  You have difficulty breathing.  You develop pain which is getting worse or is  uncontrolled with medicine.  You have a fever. MAKE SURE YOU:   Understand these instructions.  Will watch your condition.  Will get help right away if you are not doing well or get worse. Document Released: 04/24/2011 Document Revised: 01/18/2012 Document Reviewed: 04/24/2011 [facility] Patient Information 2015 [place], [place]. This information is not intended to replace advice given to you by your health care provider. Make sure you discuss any questions you have with your health care provider.  Upper Respiratory Infection, Adult An upper respiratory infection (URI) is also sometimes known as the common cold. The upper respiratory tract includes the nose, sinuses, throat, trachea, and bronchi. Bronchi are the airways leading to the lungs. Most people improve within 1 week, but symptoms can last up to 2 weeks. A residual cough may last even longer.  CAUSES Many different viruses can infect the tissues lining the upper respiratory tract. The tissues become irritated and inflamed and often become very moist. Mucus production is also common. A cold is contagious. You can easily spread the virus to others by oral contact. This includes kissing, sharing a glass, coughing, or sneezing. Touching your mouth or nose and then touching a surface, which is then touched by another person, can also spread the virus. SYMPTOMS  Symptoms typically develop 1 to 3 days after you come in contact with a cold virus. Symptoms vary from person to person. They may include:  Runny nose.  Sneezing.  Nasal congestion.  Sinus irritation.  Sore throat.  Loss  of voice (laryngitis).  Cough.  Fatigue.  Muscle aches.  Loss of appetite.  Headache.  Low-grade fever. DIAGNOSIS  You might diagnose your own cold based on familiar symptoms, since most people get a cold 2 to 3 times a year. Your caregiver can confirm this based on your exam. Most importantly, your caregiver can check that your symptoms are not due  to another disease such as strep throat, sinusitis, pneumonia, asthma, or epiglottitis. Blood tests, throat tests, and X-rays are not necessary to diagnose a common cold, but they may sometimes be helpful in excluding other more serious diseases. Your caregiver will decide if any further tests are required. RISKS AND COMPLICATIONS  You may be at risk for a more severe case of the common cold if you smoke cigarettes, have chronic heart disease (such as heart failure) or lung disease (such as asthma), or if you have a weakened immune system. The very young and very old are also at risk for more serious infections. Bacterial sinusitis, middle ear infections, and bacterial pneumonia can complicate the common cold. The common cold can worsen asthma and chronic obstructive pulmonary disease (COPD). Sometimes, these complications can require emergency medical care and may be life-threatening. PREVENTION  The best way to protect against getting a cold is to practice good hygiene. Avoid oral or hand contact with people with cold symptoms. Wash your hands often if contact occurs. There is no clear evidence that vitamin C, vitamin E, echinacea, or exercise reduces the chance of developing a cold. However, it is always recommended to get plenty of rest and practice good nutrition. TREATMENT  Treatment is directed at relieving symptoms. There is no cure. Antibiotics are not effective, because the infection is caused by a virus, not by bacteria. Treatment may include:  Increased fluid intake. Sports drinks offer valuable electrolytes, sugars, and fluids.  Breathing heated mist or steam (vaporizer or shower).  Eating chicken soup or other clear broths, and maintaining good nutrition.  Getting plenty of rest.  Using gargles or lozenges for comfort.  Controlling fevers with ibuprofen or acetaminophen as directed by your caregiver.  Increasing usage of your inhaler if you have asthma. Zinc gel and zinc lozenges,  taken in the first 24 hours of the common cold, can shorten the duration and lessen the severity of symptoms. Pain medicines may help with fever, muscle aches, and throat pain. A variety of non-prescription medicines are available to treat congestion and runny nose. Your caregiver can make recommendations and may suggest nasal or lung inhalers for other symptoms.  HOME CARE INSTRUCTIONS   Only take over-the-counter or prescription medicines for pain, discomfort, or fever as directed by your caregiver.  Use a warm mist humidifier or inhale steam from a shower to increase air moisture. This may keep secretions moist and make it easier to breathe.  Drink enough water and fluids to keep your urine clear or pale yellow.  Rest as needed.  Return to work when your temperature has returned to normal or as your caregiver advises. You may need to stay home longer to avoid infecting others. You can also use a face mask and careful hand washing to prevent spread of the virus. SEEK MEDICAL CARE IF:   After the first few days, you feel you are getting worse rather than better.  You need your caregiver's advice about medicines to control symptoms.  You develop chills, worsening shortness of breath, or brown or red sputum. These may be signs of pneumonia.  You  develop yellow or brown nasal discharge or pain in the face, especially when you bend forward. These may be signs of sinusitis.  You develop a fever, swollen neck glands, pain with swallowing, or white areas in the back of your throat. These may be signs of strep throat. SEEK IMMEDIATE MEDICAL CARE IF:   You have a fever.  You develop severe or persistent headache, ear pain, sinus pain, or chest pain.  You develop wheezing, a prolonged cough, cough up blood, or have a change in your usual mucus (if you have chronic lung disease).  You develop sore muscles or a stiff neck. Document Released: 04/21/2001 Document Revised: 01/18/2012 Document  Reviewed: 02/27/2011 [facility] Patient Information 2015 [place], [place]. This information is not intended to replace advice given to you by your health care provider. Make sure you discuss any questions you have with your health care provider.

## 2014-05-08 NOTE — ED Provider Notes (Signed)
CSN: [id number]     Arrival date & time 05/08/14  1906 History   First MD Initiated Contact with Patient 05/08/14 2000     Chief Complaint  Patient presents with  . Headache   (Consider location/radiation/quality/duration/timing/severity/associated sxs/prior Treatment) Patient is a 53 y.o. female presenting with URI. The history is provided by the patient.  URI Presenting symptoms: congestion, cough, rhinorrhea and sore throat   Presenting symptoms: no ear pain, no facial pain, no fatigue and no fever   Severity:  Mild Onset quality:  Gradual Duration:  3 days Timing:  Constant Progression:  Unchanged Chronicity:  New Associated symptoms: headaches   Associated symptoms: no arthralgias, no myalgias, no neck pain, no sinus pain, no sneezing, no swollen glands and no wheezing     Past Medical History  Diagnosis Date  . Asthma   . Diabetes mellitus   . Headache(784.0)     MIGRAINES  . Fibromyalgia   . GERD (gastroesophageal reflux disease)   . IBS (irritable bowel syndrome)   . Renal mass, right   . IC (interstitial cystitis)   . Frequency of urination   . Anemia   . Swelling of both lower extremities     TAKES MAXIDE FOR SWELLING (DENIES HBP)  . Hypothyroidism   . Sleep apnea     UNABLE TO TOLERATE C PAP MASK  . Depression   . Cancer     kidney   Past Surgical History  Procedure Laterality Date  . Cesarean section  1983, 1985     x 2  . Bladder tack    . Cystocopy    . Cystoscopy    . Robotic assited partial nephrectomy Right 12/14/2013    Procedure: ROBOTIC ASSITED PARTIAL NEPHRECTOMY;  Surgeon: Dutch Gray, MD;  Location: [facility];  Service: Urology;  Laterality: Right;  . Cystoscopy w/ ureteral stent placement Right 12/14/2013    Procedure: CYSTOSCOPY WITH RETROGRADE PYELOGRAM/URETERAL STENT PLACEMENT;  Surgeon: Dutch Gray, MD;  Location: [facility];  Service: Urology;  Laterality: Right;  . Back surgery    . Cervical laminectomy  07/2012    anterior with plates and  screws.  . Abdominal hysterectomy  1996  . Cholecystectomy  1995    tubal ligation also  . Posterior cervical laminectomy  2004   Family History  Problem Relation Age of Onset  . Cancer Mother     breast, lung  . COPD Father   . Hypertension Father   . Hyperlipidemia Father   . Stroke Father    History  Substance Use Topics  . Smoking status: Never Smoker   . Smokeless tobacco: Not on file  . Alcohol Use: Yes     Comment: occasional   OB History   Grav Para Term Preterm Abortions TAB SAB Ect Mult Living                 Review of Systems  Constitutional: Negative for fever and fatigue.  HENT: Positive for congestion, rhinorrhea and sore throat. Negative for ear pain and sneezing.   Respiratory: Positive for cough. Negative for wheezing.   Musculoskeletal: Negative for arthralgias, myalgias and neck pain.  Neurological: Positive for headaches.  All other systems reviewed and are negative.   Allergies  Sulfa antibiotics; Betadine; and Latex  Home Medications   Prior to Admission medications   Medication Sig Start Date End Date Taking? Authorizing Provider  albuterol (PROVENTIL HFA;VENTOLIN HFA) 108 (90 BASE) MCG/ACT inhaler Inhale 2 puffs into the lungs every  6 (six) hours as needed. For shortness of breath    Historical Provider, MD  ALPRAZolam Duanne Moron) 0.5 MG tablet Take 0.5 mg by mouth daily as needed for anxiety.     Historical Provider, MD  benzonatate (TESSALON) 100 MG capsule Take 1 capsule (100 mg total) by mouth 3 (three) times daily as needed for cough. 05/08/14   Lahoma Rocker, PA  buPROPion (WELLBUTRIN) 100 MG tablet Take 100 mg by mouth 2 (two) times daily.    Historical Provider, MD  Dapagliflozin Propanediol (FARXIGA) 5 MG TABS Take 5 mg by mouth every morning.    Historical Provider, MD  esomeprazole (NEXIUM) 40 MG capsule Take 40 mg by mouth 2 (two) times daily before a meal.     Historical Provider, MD  estrogens, conjugated, (PREMARIN) 0.625 MG  tablet Take 0.625 mg by mouth daily.     Historical Provider, MD  Fluticasone-Salmeterol (ADVAIR) 100-50 MCG/DOSE AEPB Inhale 1 puff into the lungs every 12 (twelve) hours.    Historical Provider, MD  HYDROcodone-acetaminophen (NORCO) 10-325 MG per tablet Take 1 tablet by mouth every 6 (six) hours as needed for moderate pain. 12/15/13   Marcie Bal, PA-C  insulin aspart (NOVOLOG) 100 UNIT/ML injection Inject 15 Units into the skin daily as needed for high blood sugar (with largestmeal if sugar is over 200).    Historical Provider, MD  insulin glargine (LANTUS) 100 UNIT/ML injection Inject 50 Units into the skin at bedtime.     Historical Provider, MD  levothyroxine (SYNTHROID, LEVOTHROID) 175 MCG tablet Take 175 mcg by mouth daily before breakfast.     Historical Provider, MD  lisinopril (PRINIVIL,ZESTRIL) 20 MG tablet Take 20 mg by mouth every morning.     Historical Provider, MD  metFORMIN (GLUCOPHAGE-XR) 500 MG 24 hr tablet Take 1,000 mg by mouth 2 (two) times daily.    Historical Provider, MD  potassium chloride (K-DUR) 10 MEQ tablet Take 10 mEq by mouth 2 (two) times daily.    Historical Provider, MD  pravastatin (PRAVACHOL) 40 MG tablet Take 40 mg by mouth every Monday, Wednesday, and Friday.     Historical Provider, MD  solifenacin (VESICARE) 10 MG tablet Take 10 mg by mouth daily.    Historical Provider, MD  topiramate (TOPAMAX) 25 MG tablet Take 25 mg by mouth 2 (two) times daily.    Historical Provider, MD  traZODone (DESYREL) 150 MG tablet Take 75 mg by mouth at bedtime.    Historical Provider, MD  triamterene-hydrochlorothiazide (MAXZIDE-25) 37.5-25 MG per tablet Take 1 tablet by mouth 2 (two) times daily.     Historical Provider, MD   BP 133/81  Pulse 94  Temp(Src) 98.5 F (36.9 C) (Oral)  Resp 16  SpO2 98% Physical Exam  Nursing note and vitals reviewed. Constitutional: She is oriented to person, place, and time. She appears well-developed and well-nourished. No distress.   HENT:  Head: Normocephalic and atraumatic.  Right Ear: Hearing, tympanic membrane, external ear and ear canal normal.  Left Ear: Hearing, tympanic membrane, external ear and ear canal normal.  Nose: Nose normal.  Mouth/Throat: Uvula is midline, oropharynx is clear and moist and mucous membranes are normal. No oral lesions. No trismus in the jaw.  Eyes: Conjunctivae are normal. No scleral icterus.  Neck: Normal range of motion. Neck supple.  Cardiovascular: Normal rate, regular rhythm and normal heart sounds.   Pulmonary/Chest: Effort normal and breath sounds normal. No respiratory distress. She has no wheezes.  Musculoskeletal: Normal range of  motion.  Lymphadenopathy:    She has no cervical adenopathy.  Neurological: She is alert and oriented to person, place, and time.  Skin: Skin is warm and dry.  Psychiatric: She has a normal mood and affect. Her behavior is normal.    ED Course  Procedures (including critical care time) Labs Review Labs Reviewed  CULTURE, GROUP A STREP  POCT RAPID STREP A ([place])    Imaging Review No results found.   MDM   1. URI (upper respiratory infection)   Rapid strep negative. Advised symptomatic care at home. Tylenol and/or ibuprofen as directed on packaging for headache. Tessalon as prescribed for cough. Likely mild self limited viral URI. Advised follow up with PCP if no improvement over next 7-8 days.    [place], [place] 05/09/14 [phone]

## 2014-05-09 NOTE — ED Provider Notes (Signed)
Medical screening examination/treatment/procedure(s) were performed by a resident physician or non-physician practitioner and as the supervising physician I was immediately available for consultation/collaboration.  Lynne Leader, MD    Gregor Hams, MD 05/09/14 [phone]

## 2014-05-10 LAB — CULTURE, GROUP A STREP

## 2014-06-25 ENCOUNTER — Other Ambulatory Visit (HOSPITAL_COMMUNITY): Payer: Self-pay | Admitting: Urology

## 2014-06-25 ENCOUNTER — Ambulatory Visit (HOSPITAL_COMMUNITY)
Admission: RE | Admit: 2014-06-25 | Discharge: 2014-06-25 | Disposition: A | Discharge status: Home / Self Care | Payer: 59 | Source: Ambulatory Visit | Attending: Urology | Admitting: Urology

## 2014-06-25 DIAGNOSIS — J8410 Pulmonary fibrosis, unspecified: Secondary | ICD-10-CM | POA: Diagnosis not present

## 2014-06-25 DIAGNOSIS — R059 Cough, unspecified: Secondary | ICD-10-CM | POA: Diagnosis present

## 2014-06-25 DIAGNOSIS — R05 Cough: Secondary | ICD-10-CM | POA: Diagnosis present

## 2014-06-25 DIAGNOSIS — C649 Malignant neoplasm of unspecified kidney, except renal pelvis: Secondary | ICD-10-CM

## 2014-08-10 ENCOUNTER — Other Ambulatory Visit (INDEPENDENT_AMBULATORY_CARE_PROVIDER_SITE_OTHER): Payer: Self-pay | Admitting: Surgery

## 2014-08-14 ENCOUNTER — Other Ambulatory Visit (HOSPITAL_COMMUNITY): Payer: Self-pay | Admitting: Internal Medicine

## 2014-08-14 ENCOUNTER — Other Ambulatory Visit: Payer: Self-pay | Admitting: Internal Medicine

## 2014-08-14 DIAGNOSIS — Z1231 Encounter for screening mammogram for malignant neoplasm of breast: Secondary | ICD-10-CM

## 2014-08-14 DIAGNOSIS — M25551 Pain in right hip: Secondary | ICD-10-CM

## 2014-08-17 ENCOUNTER — Ambulatory Visit (HOSPITAL_COMMUNITY): Payer: 59

## 2014-08-23 ENCOUNTER — Ambulatory Visit (HOSPITAL_COMMUNITY)
Admission: RE | Admit: 2014-08-23 | Discharge: 2014-08-23 | Disposition: A | Discharge status: Home / Self Care | Payer: 59 | Source: Ambulatory Visit | Attending: Internal Medicine | Admitting: Internal Medicine

## 2014-08-23 DIAGNOSIS — M1611 Unilateral primary osteoarthritis, right hip: Secondary | ICD-10-CM | POA: Insufficient documentation

## 2014-08-23 DIAGNOSIS — M25551 Pain in right hip: Secondary | ICD-10-CM | POA: Diagnosis present

## 2014-08-23 DIAGNOSIS — C649 Malignant neoplasm of unspecified kidney, except renal pelvis: Secondary | ICD-10-CM | POA: Diagnosis not present

## 2014-08-31 ENCOUNTER — Other Ambulatory Visit: Payer: Self-pay | Admitting: Internal Medicine

## 2014-08-31 DIAGNOSIS — Z1231 Encounter for screening mammogram for malignant neoplasm of breast: Secondary | ICD-10-CM

## 2014-09-06 ENCOUNTER — Encounter (HOSPITAL_BASED_OUTPATIENT_CLINIC_OR_DEPARTMENT_OTHER): Payer: Self-pay | Admitting: *Deleted

## 2014-09-06 NOTE — Progress Notes (Signed)
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

## 2014-09-10 ENCOUNTER — Ambulatory Visit
Admission: RE | Admit: 2014-09-10 | Discharge: 2014-09-10 | Disposition: A | Discharge status: Home / Self Care | Payer: 59 | Source: Ambulatory Visit | Attending: Internal Medicine | Admitting: Internal Medicine

## 2014-09-10 ENCOUNTER — Encounter (HOSPITAL_BASED_OUTPATIENT_CLINIC_OR_DEPARTMENT_OTHER)
Admission: RE | Admit: 2014-09-10 | Discharge: 2014-09-10 | Disposition: A | Discharge status: Home / Self Care | Payer: 59 | Source: Ambulatory Visit | Attending: Surgery | Admitting: Surgery

## 2014-09-10 DIAGNOSIS — G4730 Sleep apnea, unspecified: Secondary | ICD-10-CM | POA: Diagnosis not present

## 2014-09-10 DIAGNOSIS — Z882 Allergy status to sulfonamides status: Secondary | ICD-10-CM | POA: Diagnosis not present

## 2014-09-10 DIAGNOSIS — I10 Essential (primary) hypertension: Secondary | ICD-10-CM | POA: Diagnosis not present

## 2014-09-10 DIAGNOSIS — J45909 Unspecified asthma, uncomplicated: Secondary | ICD-10-CM | POA: Diagnosis not present

## 2014-09-10 DIAGNOSIS — Z1231 Encounter for screening mammogram for malignant neoplasm of breast: Secondary | ICD-10-CM

## 2014-09-10 DIAGNOSIS — Z794 Long term (current) use of insulin: Secondary | ICD-10-CM | POA: Diagnosis not present

## 2014-09-10 DIAGNOSIS — F419 Anxiety disorder, unspecified: Secondary | ICD-10-CM | POA: Diagnosis not present

## 2014-09-10 DIAGNOSIS — K432 Incisional hernia without obstruction or gangrene: Secondary | ICD-10-CM | POA: Diagnosis not present

## 2014-09-10 DIAGNOSIS — Z79899 Other long term (current) drug therapy: Secondary | ICD-10-CM | POA: Diagnosis not present

## 2014-09-10 DIAGNOSIS — E780 Pure hypercholesterolemia: Secondary | ICD-10-CM | POA: Diagnosis not present

## 2014-09-10 DIAGNOSIS — Z91040 Latex allergy status: Secondary | ICD-10-CM | POA: Diagnosis not present

## 2014-09-10 DIAGNOSIS — E079 Disorder of thyroid, unspecified: Secondary | ICD-10-CM | POA: Diagnosis not present

## 2014-09-10 DIAGNOSIS — E119 Type 2 diabetes mellitus without complications: Secondary | ICD-10-CM | POA: Diagnosis not present

## 2014-09-10 DIAGNOSIS — F329 Major depressive disorder, single episode, unspecified: Secondary | ICD-10-CM | POA: Diagnosis not present

## 2014-09-10 DIAGNOSIS — Z883 Allergy status to other anti-infective agents status: Secondary | ICD-10-CM | POA: Diagnosis not present

## 2014-09-10 LAB — BASIC METABOLIC PANEL
ANION GAP: 16 — AB (ref 5–15)
BUN: 20 mg/dL (ref 6–23)
CO2: 24 meq/L (ref 19–32)
CREATININE: 1.18 mg/dL — AB (ref 0.50–1.10)
Calcium: 9.8 mg/dL (ref 8.4–10.5)
Chloride: 98 mEq/L (ref 96–112)
GFR calc Af Amer: 59 mL/min — ABNORMAL LOW (ref >90)
GFR calc non Af Amer: 51 mL/min — ABNORMAL LOW (ref >90)
Glucose, Bld: 181 mg/dL — ABNORMAL HIGH (ref 70–99)
POTASSIUM: 5.4 meq/L — AB (ref 3.7–5.3)
Sodium: 138 mEq/L (ref 137–147)

## 2014-09-10 NOTE — Pre-Procedure Instructions (Signed)
K+ and creatinine reviewed by Dr. Al Corpus; need to do istat DOS.

## 2014-09-11 NOTE — H&P (Signed)
History of Present Illness Patient words: Joann [name].  The patient is a 53 year old female who presents with an incisional Joann [name]. This patient is a self-referral. She had a robot .assisted partial nephrectomy in February of 2015. Shortly thereafter, she noticed a bulge at the upper midline port site. It is Slowly becoming larger and causing her increasing discomfort. she has no obstructive symptoms. She denies nausea or vomiting. Her pain is moderate and described as an ache.She is otherwise without complaints.   Other Problems  Anxiety Disorder Asthma Back Pain Cancer Depression Diabetes Mellitus Hypercholesterolemia Migraine Headache Other disease, cancer, significant illness Sleep Apnea Thyroid Disease  Past Surgical History  Cesarean Section - Multiple Colon Polyp Removal - Colonoscopy Gallbladder Surgery - Laparoscopic Hysterectomy (not due to cancer) - Complete Nephrectomy Right. Spinal Surgery - Neck  Diagnostic Studies History  Mammogram 1-3 years ago Pap Smear >5 years ago  Allergies  Sulfa Drugs Latex Betadine  Medication History Albuterol Sulfate HFA (108 (90 Base)MCG/ACT Aerosol Soln, Inhalation) Active. Xanax (0.5MG  Tablet, Oral) Active. Tessalon Perles (100MG  Capsule, Oral) Active. Wellbutrin SR (100MG  Tablet ER 12HR, Oral) Active. Farxiga (5MG  Tablet, Oral) Active. NexIUM (40MG  Packet, Oral) Active. Premarin (0.625MG  Tablet, Oral) Active. Advair Diskus (100-50MCG/DOSE Aero Pow Br Act, Inhalation) Active. Norco (10-325MG  Tablet, Oral) Active. NovoLOG FlexPen (100UNIT/ML Soln Pen-inj, Subcutaneous) Active. Lantus (100UNIT/ML Solution, Subcutaneous) Active. Levothyroxine Sodium (175MCG Tablet, Oral) Active. Lisinopril (20MG  Tablet, Oral) Active. MetFORMIN HCl (500MG  Tablet, Oral) Active. K-Dur (10MEQ Tablet ER, Oral) Active. Pravastatin Sodium (40MG  Tablet, Oral) Active. VESIcare (10MG  Tablet, Oral)  Active. Topamax (25MG  Tablet, Oral) Active. TraZODone HCl (150MG  Tablet, Oral) Active. Maxzide-25 (37.5-25MG  Tablet, Oral) Active. Medications Reconciled  Social History Alcohol use Occasional alcohol use. Caffeine use Carbonated beverages, Coffee, Tea. No drug use Tobacco use Never smoker.  Family History  Alcohol Abuse Son. Arthritis Father. Bleeding disorder Father. Breast Cancer Mother. Cancer Mother. Cerebrovascular Accident Father. Colon Polyps Father. Heart disease in female family member before age 47 Hypertension Father. Kidney Disease Father. Migraine Headache Brother. Respiratory Condition Father.  Pregnancy / Birth History  Age at menarche 46 years. Age of menopause <44 Gravida 2 Maternal age 64-20 Para 2  Review of Systems  General Not Present- Appetite Loss, Chills, Fatigue, Fever, Night Sweats, Weight Gain and Weight Loss. Skin Not Present- Change in Wart/Mole, Dryness, Hives, Jaundice, New Lesions, Non-Healing Wounds, Rash and Ulcer. HEENT Present- Seasonal Allergies. Not Present- Earache, Hearing Loss, Hoarseness, Nose Bleed, Oral Ulcers, Ringing in the Ears, Sinus Pain, Sore Throat, Visual Disturbances, Wears glasses/contact lenses and Yellow Eyes. Respiratory Present- Snoring. Not Present- Bloody sputum, Chronic Cough, Difficulty Breathing and Wheezing. Breast Not Present- Breast Mass, Breast Pain, Nipple Discharge and Skin Changes. Cardiovascular Not Present- Chest Pain, Difficulty Breathing Lying Down, Leg Cramps, Palpitations, Rapid Heart Rate, Shortness of Breath and Swelling of Extremities. Gastrointestinal Not Present- Abdominal Pain, Bloating, Bloody Stool, Change in Bowel Habits, Chronic diarrhea, Constipation, Difficulty Swallowing, Excessive gas, Gets full quickly at meals, Hemorrhoids, Indigestion, Nausea, Rectal Pain and Vomiting. Female Genitourinary Not Present- Frequency, Nocturia, Painful Urination, Pelvic Pain and  Urgency. Musculoskeletal Present- Back Pain, Joint Pain, Joint Stiffness and Muscle Pain. Not Present- Muscle Weakness and Swelling of Extremities. Neurological Not Present- Decreased Memory, Fainting, Headaches, Numbness, Seizures, Tingling, Tremor, Trouble walking and Weakness. Psychiatric Present- Depression. Not Present- Anxiety, Bipolar, Change in Sleep Pattern, Fearful and Frequent crying. Endocrine Not Present- Cold Intolerance, Excessive Hunger, Hair Changes, Heat Intolerance, Hot flashes and New Diabetes. Hematology Not Present- Easy Bruising,  Excessive bleeding, Gland problems, HIV and Persistent Infections.     Weight: 187.2 lb Height: 60in Body Surface Area: 1.9 m Body Mass Index: 36.56 kg/m Temp.: 98.74F(Oral)  Pulse: 93 (Regular)  BP: 120/64 (Sitting, Left Arm, Standard)    Physical Exam  General Note: Morbidly obese female in no acute distress   Head and Neck Note: neck supple, normocephalic, atraumatic, external ears and nose are normal, hearing is normal, oropharynx is clear trachea is midline   Eye Note: pupils reactive bilaterally, anicteric   Chest and Lung Exam Note: clear to auscultation bilaterally with normal respiratory effort   Cardiovascular Note: regular rate and rhythm with no murmurs or edema   Abdomen Note: obese, moderate sized incisional Joann [name] at the trocar site from the robotic surgery it is easily reducible   Peripheral Vascular Note: peripheral pulses intact throughout   Neurologic Note: awake, alert, and oriented x3   Neuropsychiatric Note: judgment and affect are normal   Musculoskeletal Note: no long bone abnormalities     Assessment & Plan  INCISIONAL Joann [name] (K43.2) Impression: repair with mesh was recommended.I discussed both the laparoscopic and open techniques. She wishes to proceed with an open incisional Joann [name] repair with mesh. I discussed the risks which includesbut is not limited  to bleeding, infection,injury to surrounding structures, recurrence, postoperative recovery, cardiopulmonary issues, et Ronney Asters. She understands and wishes to proceed.

## 2014-09-12 ENCOUNTER — Ambulatory Visit (HOSPITAL_BASED_OUTPATIENT_CLINIC_OR_DEPARTMENT_OTHER): Payer: 59 | Admitting: Certified Registered"

## 2014-09-12 ENCOUNTER — Encounter (HOSPITAL_BASED_OUTPATIENT_CLINIC_OR_DEPARTMENT_OTHER)
Admission: RE | Disposition: A | Discharge status: Home / Self Care | Payer: Self-pay | Source: Ambulatory Visit | Attending: Surgery

## 2014-09-12 ENCOUNTER — Encounter (HOSPITAL_BASED_OUTPATIENT_CLINIC_OR_DEPARTMENT_OTHER): Payer: Self-pay | Admitting: Certified Registered"

## 2014-09-12 ENCOUNTER — Ambulatory Visit (HOSPITAL_BASED_OUTPATIENT_CLINIC_OR_DEPARTMENT_OTHER)
Admission: RE | Admit: 2014-09-12 | Discharge: 2014-09-12 | Disposition: A | Discharge status: Home / Self Care | Payer: 59 | Source: Ambulatory Visit | Attending: Surgery | Admitting: Surgery

## 2014-09-12 DIAGNOSIS — Z882 Allergy status to sulfonamides status: Secondary | ICD-10-CM | POA: Insufficient documentation

## 2014-09-12 DIAGNOSIS — Z91040 Latex allergy status: Secondary | ICD-10-CM | POA: Insufficient documentation

## 2014-09-12 DIAGNOSIS — E780 Pure hypercholesterolemia: Secondary | ICD-10-CM | POA: Insufficient documentation

## 2014-09-12 DIAGNOSIS — E079 Disorder of thyroid, unspecified: Secondary | ICD-10-CM | POA: Insufficient documentation

## 2014-09-12 DIAGNOSIS — Z79899 Other long term (current) drug therapy: Secondary | ICD-10-CM | POA: Insufficient documentation

## 2014-09-12 DIAGNOSIS — G4730 Sleep apnea, unspecified: Secondary | ICD-10-CM | POA: Insufficient documentation

## 2014-09-12 DIAGNOSIS — Z794 Long term (current) use of insulin: Secondary | ICD-10-CM | POA: Insufficient documentation

## 2014-09-12 DIAGNOSIS — K432 Incisional hernia without obstruction or gangrene: Secondary | ICD-10-CM | POA: Insufficient documentation

## 2014-09-12 DIAGNOSIS — E119 Type 2 diabetes mellitus without complications: Secondary | ICD-10-CM | POA: Insufficient documentation

## 2014-09-12 DIAGNOSIS — F419 Anxiety disorder, unspecified: Secondary | ICD-10-CM | POA: Insufficient documentation

## 2014-09-12 DIAGNOSIS — J45909 Unspecified asthma, uncomplicated: Secondary | ICD-10-CM | POA: Insufficient documentation

## 2014-09-12 DIAGNOSIS — Z883 Allergy status to other anti-infective agents status: Secondary | ICD-10-CM | POA: Insufficient documentation

## 2014-09-12 DIAGNOSIS — F329 Major depressive disorder, single episode, unspecified: Secondary | ICD-10-CM | POA: Insufficient documentation

## 2014-09-12 DIAGNOSIS — I10 Essential (primary) hypertension: Secondary | ICD-10-CM | POA: Insufficient documentation

## 2014-09-12 HISTORY — PX: INCISIONAL HERNIA REPAIR: SHX193

## 2014-09-12 HISTORY — DX: Presence of spectacles and contact lenses: Z97.3

## 2014-09-12 HISTORY — PX: INSERTION OF MESH: SHX5868

## 2014-09-12 HISTORY — DX: Essential (primary) hypertension: I10

## 2014-09-12 LAB — GLUCOSE, CAPILLARY: Glucose-Capillary: 150 mg/dL — ABNORMAL HIGH (ref 70–99)

## 2014-09-12 SURGERY — REPAIR, HERNIA, INCISIONAL
Anesthesia: General

## 2014-09-12 MED ORDER — HYDROMORPHONE HCL 1 MG/ML IJ SOLN
0.2500 mg | INTRAMUSCULAR | Status: DC | PRN
  Administered 2014-09-12 (×2): 0.5 mg via INTRAVENOUS

## 2014-09-12 MED ORDER — EPHEDRINE SULFATE 50 MG/ML IJ SOLN
INTRAMUSCULAR | Status: DC | PRN
  Administered 2014-09-12: 10 mg via INTRAVENOUS

## 2014-09-12 MED ORDER — FENTANYL CITRATE 0.05 MG/ML IJ SOLN
INTRAMUSCULAR | Status: DC | PRN
  Administered 2014-09-12: 100 ug via INTRAVENOUS

## 2014-09-12 MED ORDER — LIDOCAINE HCL (PF) 1 % IJ SOLN
INTRAMUSCULAR | Status: AC
  Filled 2014-09-12: qty 30

## 2014-09-12 MED ORDER — MIDAZOLAM HCL 2 MG/2ML IJ SOLN: 1.0000 mg | INTRAMUSCULAR | Status: DC | PRN

## 2014-09-12 MED ORDER — BUPIVACAINE-EPINEPHRINE (PF) 0.5% -1:200000 IJ SOLN
INTRAMUSCULAR | Status: AC
  Filled 2014-09-12: qty 30

## 2014-09-12 MED ORDER — PROMETHAZINE HCL 25 MG/ML IJ SOLN: 6.2500 mg | INTRAMUSCULAR | Status: DC | PRN

## 2014-09-12 MED ORDER — OXYCODONE HCL 5 MG/5ML PO SOLN: 5.0000 mg | Freq: Once | ORAL | Status: AC | PRN

## 2014-09-12 MED ORDER — BUPIVACAINE-EPINEPHRINE 0.5% -1:200000 IJ SOLN
INTRAMUSCULAR | Status: DC | PRN
  Administered 2014-09-12: 30 mL

## 2014-09-12 MED ORDER — FENTANYL CITRATE 0.05 MG/ML IJ SOLN
INTRAMUSCULAR | Status: AC
  Filled 2014-09-12: qty 6

## 2014-09-12 MED ORDER — OXYCODONE HCL 5 MG PO TABS: 5.0000 mg | ORAL_TABLET | Freq: Once | ORAL | Status: DC | PRN

## 2014-09-12 MED ORDER — SCOPOLAMINE 1 MG/3DAYS TD PT72
MEDICATED_PATCH | TRANSDERMAL | Status: DC | PRN
  Administered 2014-09-12: 1 via TRANSDERMAL

## 2014-09-12 MED ORDER — OXYCODONE-ACETAMINOPHEN 5-325 MG PO TABS: 1.0000 | ORAL_TABLET | ORAL | Status: DC | PRN

## 2014-09-12 MED ORDER — SCOPOLAMINE 1 MG/3DAYS TD PT72
MEDICATED_PATCH | TRANSDERMAL | Status: AC
  Filled 2014-09-12: qty 1

## 2014-09-12 MED ORDER — FENTANYL CITRATE 0.05 MG/ML IJ SOLN
50.0000 ug | INTRAMUSCULAR | Status: DC | PRN
Start: 2014-09-12 — End: 2014-09-12

## 2014-09-12 MED ORDER — CEFAZOLIN SODIUM-DEXTROSE 2-3 GM-% IV SOLR
2.0000 g | INTRAVENOUS | Status: AC
  Administered 2014-09-12: 2 g via INTRAVENOUS

## 2014-09-12 MED ORDER — MIDAZOLAM HCL 2 MG/2ML IJ SOLN
INTRAMUSCULAR | Status: AC
  Filled 2014-09-12: qty 2

## 2014-09-12 MED ORDER — OXYCODONE HCL 5 MG/5ML PO SOLN: 5.0000 mg | Freq: Once | ORAL | Status: DC | PRN

## 2014-09-12 MED ORDER — ONDANSETRON HCL 4 MG/2ML IJ SOLN: 4.0000 mg | Freq: Once | INTRAMUSCULAR | Status: DC | PRN

## 2014-09-12 MED ORDER — ROCURONIUM BROMIDE 100 MG/10ML IV SOLN
INTRAVENOUS | Status: DC | PRN
  Administered 2014-09-12: 20 mg via INTRAVENOUS

## 2014-09-12 MED ORDER — LIDOCAINE HCL (CARDIAC) 20 MG/ML IV SOLN
INTRAVENOUS | Status: DC | PRN
  Administered 2014-09-12: 60 mg via INTRAVENOUS

## 2014-09-12 MED ORDER — HYDROMORPHONE HCL 1 MG/ML IJ SOLN: 0.2500 mg | INTRAMUSCULAR | Status: DC | PRN

## 2014-09-12 MED ORDER — ONDANSETRON HCL 4 MG/2ML IJ SOLN
INTRAMUSCULAR | Status: DC | PRN
  Administered 2014-09-12: 4 mg via INTRAVENOUS

## 2014-09-12 MED ORDER — DEXTROSE 5 % IV SOLN
10.0000 mg | INTRAVENOUS | Status: DC | PRN
  Administered 2014-09-12: 50 ug/min via INTRAVENOUS

## 2014-09-12 MED ORDER — OXYCODONE HCL 5 MG PO TABS
5.0000 mg | ORAL_TABLET | Freq: Once | ORAL | Status: AC | PRN
  Administered 2014-09-12: 5 mg via ORAL

## 2014-09-12 MED ORDER — SUCCINYLCHOLINE CHLORIDE 20 MG/ML IJ SOLN
INTRAMUSCULAR | Status: DC | PRN
  Administered 2014-09-12: 100 mg via INTRAVENOUS

## 2014-09-12 MED ORDER — HYDROMORPHONE HCL 1 MG/ML IJ SOLN
INTRAMUSCULAR | Status: AC
  Filled 2014-09-12: qty 1

## 2014-09-12 MED ORDER — GLYCOPYRROLATE 0.2 MG/ML IJ SOLN
INTRAMUSCULAR | Status: DC | PRN
  Administered 2014-09-12: 0.6 mg via INTRAVENOUS

## 2014-09-12 MED ORDER — MIDAZOLAM HCL 5 MG/5ML IJ SOLN
INTRAMUSCULAR | Status: DC | PRN
  Administered 2014-09-12: 2 mg via INTRAVENOUS

## 2014-09-12 MED ORDER — PROPOFOL 10 MG/ML IV EMUL
INTRAVENOUS | Status: AC
  Filled 2014-09-12: qty 50

## 2014-09-12 MED ORDER — NEOSTIGMINE METHYLSULFATE 10 MG/10ML IV SOLN
INTRAVENOUS | Status: DC | PRN
  Administered 2014-09-12: 3 mg via INTRAVENOUS

## 2014-09-12 MED ORDER — PROPOFOL 10 MG/ML IV BOLUS
INTRAVENOUS | Status: DC | PRN
  Administered 2014-09-12: 20 mg via INTRAVENOUS
  Administered 2014-09-12: 160 mg via INTRAVENOUS
  Administered 2014-09-12: 30 mg via INTRAVENOUS

## 2014-09-12 MED ORDER — OXYCODONE HCL 5 MG PO TABS
ORAL_TABLET | ORAL | Status: AC
  Filled 2014-09-12: qty 1

## 2014-09-12 MED ORDER — LACTATED RINGERS IV SOLN
INTRAVENOUS | Status: DC
Start: 2014-09-12 — End: 2014-09-12
  Administered 2014-09-12 (×2): via INTRAVENOUS

## 2014-09-12 MED ORDER — SODIUM BICARBONATE 4 % IV SOLN
INTRAVENOUS | Status: AC
  Filled 2014-09-12: qty 5

## 2014-09-12 MED ORDER — SCOPOLAMINE 1 MG/3DAYS TD PT72: 1.0000 | MEDICATED_PATCH | TRANSDERMAL | Status: DC

## 2014-09-12 SURGICAL SUPPLY — 46 items
BENZOIN TINCTURE PRP APPL 2/3 (GAUZE/BANDAGES/DRESSINGS) ×3 IMPLANT
BLADE CLIPPER SURG (BLADE) IMPLANT
BLADE SURG 15 STRL LF DISP TIS (BLADE) ×1 IMPLANT
BLADE SURG 15 STRL SS (BLADE) ×2
CANISTER SUCT 1200ML W/VALVE (MISCELLANEOUS) IMPLANT
CHLORAPREP W/TINT 26ML (MISCELLANEOUS) ×3 IMPLANT
CLEANER CAUTERY TIP 5X5 PAD (MISCELLANEOUS) ×1 IMPLANT
CLOSURE WOUND 1/2 X4 (GAUZE/BANDAGES/DRESSINGS) ×1
COVER BACK TABLE 60X90IN (DRAPES) ×3 IMPLANT
COVER MAYO STAND STRL (DRAPES) ×3 IMPLANT
DECANTER SPIKE VIAL GLASS SM (MISCELLANEOUS) IMPLANT
DRAPE PED LAPAROTOMY (DRAPES) ×3 IMPLANT
DRAPE UTILITY XL STRL (DRAPES) ×3 IMPLANT
DRSG TEGADERM 2-3/8X2-3/4 SM (GAUZE/BANDAGES/DRESSINGS) IMPLANT
DRSG TEGADERM 4X4.75 (GAUZE/BANDAGES/DRESSINGS) ×3 IMPLANT
ELECT REM PT RETURN 9FT ADLT (ELECTROSURGICAL) ×3
ELECTRODE REM PT RTRN 9FT ADLT (ELECTROSURGICAL) ×1 IMPLANT
GLOVE SURG SIGNA 7.5 PF LTX (GLOVE) ×3 IMPLANT
GOWN STRL REUS W/ TWL LRG LVL3 (GOWN DISPOSABLE) ×1 IMPLANT
GOWN STRL REUS W/ TWL XL LVL3 (GOWN DISPOSABLE) ×1 IMPLANT
GOWN STRL REUS W/TWL LRG LVL3 (GOWN DISPOSABLE) ×2
GOWN STRL REUS W/TWL XL LVL3 (GOWN DISPOSABLE) ×2
MESH VENTRALEX ST 8CM LRG (Mesh General) ×3 IMPLANT
NEEDLE HYPO 25X1 1.5 SAFETY (NEEDLE) ×3 IMPLANT
NS IRRIG 1000ML POUR BTL (IV SOLUTION) IMPLANT
PACK BASIN DAY SURGERY FS (CUSTOM PROCEDURE TRAY) ×3 IMPLANT
PAD CLEANER CAUTERY TIP 5X5 (MISCELLANEOUS) ×2
PENCIL BUTTON HOLSTER BLD 10FT (ELECTRODE) ×3 IMPLANT
SLEEVE SCD COMPRESS KNEE MED (MISCELLANEOUS) ×3 IMPLANT
SPONGE GAUZE 4X4 12PLY STER LF (GAUZE/BANDAGES/DRESSINGS) ×3 IMPLANT
SPONGE LAP 4X18 X RAY DECT (DISPOSABLE) ×3 IMPLANT
STRIP CLOSURE SKIN 1/2X4 (GAUZE/BANDAGES/DRESSINGS) ×2 IMPLANT
SUT MNCRL AB 4-0 PS2 18 (SUTURE) ×3 IMPLANT
SUT NOVA NAB DX-16 0-1 5-0 T12 (SUTURE) ×9 IMPLANT
SUT SILK 3 0 TIES 17X18 (SUTURE) ×2
SUT SILK 3-0 18XBRD TIE BLK (SUTURE) ×1 IMPLANT
SUT VIC AB 2-0 SH 27 (SUTURE)
SUT VIC AB 2-0 SH 27XBRD (SUTURE) IMPLANT
SUT VIC AB 3-0 SH 27 (SUTURE) ×4
SUT VIC AB 3-0 SH 27X BRD (SUTURE) ×2 IMPLANT
SYR CONTROL 10ML LL (SYRINGE) ×3 IMPLANT
TOWEL OR 17X24 6PK STRL BLUE (TOWEL DISPOSABLE) ×3 IMPLANT
TOWEL OR NON WOVEN STRL DISP B (DISPOSABLE) ×3 IMPLANT
TUBE CONNECTING 20'X1/4 (TUBING) ×1
TUBE CONNECTING 20X1/4 (TUBING) ×2 IMPLANT
YANKAUER SUCT BULB TIP NO VENT (SUCTIONS) ×3 IMPLANT

## 2014-09-12 NOTE — Interval H&P Note (Signed)
History and Physical Interval Note: no change in H and P  09/12/2014 8:00 AM  Joann [name]  has presented today for surgery, with the diagnosis of Incisional Hernia  The various methods of treatment have been discussed with the patient and family. After consideration of risks, benefits and other options for treatment, the patient has consented to  Procedure(s): HERNIA REPAIR INCISIONAL (N/A) INSERTION OF MESH (N/A) as a surgical intervention .  The patient's history has been reviewed, patient examined, no change in status, stable for surgery.  I have reviewed the patient's chart and labs.  Questions were answered to the patient's satisfaction.     [name] A

## 2014-09-12 NOTE — Anesthesia Procedure Notes (Signed)
Procedure Name: Intubation Date/Time: 09/12/2014 9:15 AM Performed by: Baxter Flattery Pre-anesthesia Checklist: Patient identified, Emergency Drugs available, Suction available and Patient being monitored Patient Re-evaluated:Patient Re-evaluated prior to inductionOxygen Delivery Method: Circle System Utilized Preoxygenation: Pre-oxygenation with 100% oxygen Intubation Type: IV induction Ventilation: Mask ventilation without difficulty Laryngoscope Size: Miller and 2 Grade View: Grade II Tube type: Oral Number of attempts: 1 Airway Equipment and Method: stylet Placement Confirmation: ETT inserted through vocal cords under direct vision,  positive ETCO2 and breath sounds checked- equal and bilateral Secured at: 22 cm Tube secured with: Tape Dental Injury: Teeth and Oropharynx as per pre-operative assessment

## 2014-09-12 NOTE — Discharge Instructions (Signed)
[facility] _______[facility]  UMBILICAL OR INGUINAL HERNIA REPAIR: POST OP INSTRUCTIONS  Always review your discharge instruction sheet given to you by the facility where your surgery was performed. IF YOU HAVE DISABILITY OR FAMILY LEAVE FORMS, YOU MUST BRING THEM TO THE OFFICE FOR PROCESSING.   DO NOT GIVE THEM TO YOUR DOCTOR.  1. A  prescription for pain medication may be given to you upon discharge.  Take your pain medication as prescribed, if needed.  If narcotic pain medicine is not needed, then you may take acetaminophen (Tylenol) or ibuprofen (Advil) as needed. 2. Take your usually prescribed medications unless otherwise directed. 3. If you need a refill on your pain medication, please contact your pharmacy.  They will contact our office to request authorization. Prescriptions will not be filled after 5 pm or on week-ends. 4. You should follow a light diet the first 24 hours after arrival home, such as soup and crackers, etc.  Be sure to include lots of fluids daily.  Resume your normal diet the day after surgery. 5. Most patients will experience some swelling and bruising around the umbilicus or in the groin and scrotum.  Ice packs and reclining will help.  Swelling and bruising can take several days to resolve.  6. It is common to experience some constipation if taking pain medication after surgery.  Increasing fluid intake and taking a stool softener (such as Colace) will usually help or prevent this problem from occurring.  A mild laxative (Milk of Magnesia or Miralax) should be taken according to package directions if there are no bowel movements after 48 hours. 7. Unless discharge instructions indicate otherwise, you may remove your bandages 24-48 hours after surgery, and you may shower at that time.  You may have steri-strips (small skin tapes) in place directly over the incision.  These strips should be left on the skin for 7-10 days.  If your surgeon used skin glue on the  incision, you may shower in 24 hours.  The glue will flake off over the next 2-3 weeks.  Any sutures or staples will be removed at the office during your follow-up visit. 8. ACTIVITIES:  You may resume regular (light) daily activities beginning the next day--such as daily self-care, walking, climbing stairs--gradually increasing activities as tolerated.  You may have sexual intercourse when it is comfortable.  Refrain from any heavy lifting or straining until approved by your doctor. a. You may drive when you are no longer taking prescription pain medication, you can comfortably wear a seatbelt, and you can safely maneuver your car and apply brakes. b. RETURN TO WORK:  __________________________________________________________ 9. You should see your doctor in the office for a follow-up appointment approximately 2-3 weeks after your surgery.  Make sure that you call for this appointment within a day or two after you arrive home to insure a convenient appointment time. 10. OTHER INSTRUCTIONS: NO LIFTING MORE THAN 15 POUNDS FOR 4 WEEKS 11. ICE PACK AND IBUPROFEN ALSO FOR PAIN 12. MAY SHOWER WITH BANDAGE ON.  CAN REMOVE BANDAGE Friday OR Saturday 13.  __________________________________________________________________________________________________________________________________________________________________________________________  WHEN TO CALL YOUR DOCTOR: 1. Fever over 101.0 2. Inability to urinate 3. Nausea and/or vomiting 4. Extreme swelling or bruising 5. Continued bleeding from incision. 6. Increased pain, redness, or drainage from the incision  The clinic staff is available to answer your questions during regular business hours.  Please dont hesitate to call and ask to speak to one of the nurses for clinical concerns.  If you have a medical emergency, go to the nearest emergency room or call 911.  A surgeon from [facility] is always on call at the hospital   [address], [place], [place], [place]  [address] ?  [address], [place], [place]   [phone] ? [phone] ? FAX [phone] Web site: [email or web address]   Post Anesthesia Home Care Instructions  Activity: Get plenty of rest for the remainder of the day. A responsible adult should stay with you for 24 hours following the procedure.  For the next 24 hours, DO NOT: -Drive a car -Paediatric nurse -Drink alcoholic beverages -Take any medication unless instructed by your physician -Make any legal decisions or sign important papers.  Meals: Start with liquid foods such as gelatin or soup. Progress to regular foods as tolerated. Avoid greasy, spicy, heavy foods. If nausea and/or vomiting occur, drink only clear liquids until the nausea and/or vomiting subsides. Call your physician if vomiting continues.  Special Instructions/Symptoms: Your throat may feel dry or sore from the anesthesia or the breathing tube placed in your throat during surgery. If this causes discomfort, gargle with warm salt water. The discomfort should disappear within 24 hours.

## 2014-09-12 NOTE — Transfer of Care (Signed)
Immediate Anesthesia Transfer of Care Note  Patient: Joann [name]  Procedure(s) Performed: Procedure(s): HERNIA REPAIR INCISIONAL (N/A) INSERTION OF MESH (N/A)  Patient Location: PACU  Anesthesia Type:General  Level of Consciousness: awake, alert  and sedated  Airway & Oxygen Therapy: Patient Spontanous Breathing and Patient connected to face mask oxygen  Post-op Assessment: Report given to PACU RN, Post -op Vital signs reviewed and stable and Patient moving all extremities  Post vital signs: Reviewed and stable  Complications: No apparent anesthesia complications

## 2014-09-12 NOTE — Op Note (Signed)
HERNIA REPAIR INCISIONAL, INSERTION OF MESH  Procedure Note  Joann [name] 09/12/2014   Pre-op Diagnosis: Incisional Hernia     Post-op Diagnosis: same  Procedure(s): HERNIA REPAIR INCISIONAL INSERTION OF MESH  Surgeon(s): Coralie Keens, MD  Anesthesia: General  Staff:  Circulator: Lauris Chroman, RN Scrub Person: Levora Angel, RN  Estimated Blood Loss: Minimal                         Joann [name]   Date: 09/12/2014  Time: 10:13 AM

## 2014-09-12 NOTE — Anesthesia Postprocedure Evaluation (Signed)
  Anesthesia Post-op Note  Patient: Joann [name]  Procedure(s) Performed: Procedure(s): HERNIA REPAIR INCISIONAL (N/A) INSERTION OF MESH (N/A)  Patient Location: PACU  Anesthesia Type: General   Level of Consciousness: awake, alert  and oriented  Airway and Oxygen Therapy: Patient Spontanous Breathing  Post-op Pain:  moderate  Post-op Assessment: Post-op Vital signs reviewed  Post-op Vital Signs: Reviewed  Last Vitals:  Filed Vitals:   09/12/14 1045  BP: 136/70  Pulse: 94  Temp:   Resp: 18    Complications: No apparent anesthesia complications

## 2014-09-12 NOTE — Anesthesia Preprocedure Evaluation (Signed)
Anesthesia Evaluation  Patient identified by MRN, date of birth, ID band Patient awake    Reviewed: Allergy & Precautions, H&P , NPO status , Patient's Chart, lab work & pertinent test results  Airway Mallampati: II       Dental  (+) Teeth Intact, Dental Advisory Given   Pulmonary asthma , sleep apnea ,    Pulmonary exam normal       Cardiovascular hypertension, negative cardio ROS      Neuro/Psych  Headaches, PSYCHIATRIC DISORDERS Depression  Neuromuscular disease negative psych ROS   GI/Hepatic Neg liver ROS, GERD-  ,  Endo/Other  negative endocrine ROSdiabetesHypothyroidism   Renal/GU negative Renal ROS  negative genitourinary   Musculoskeletal negative musculoskeletal ROS (+) Fibromyalgia -  Abdominal   Peds negative pediatric ROS (+)  Hematology negative hematology ROS (+)   Anesthesia Other Findings   Reproductive/Obstetrics negative OB ROS                             Anesthesia Physical Anesthesia Plan  ASA: III  Anesthesia Plan: General   Post-op Pain Management:    Induction: Intravenous  Airway Management Planned: Oral ETT  Additional Equipment:   Intra-op Plan:   Post-operative Plan: Extubation in OR  Informed Consent: I have reviewed the patients History and Physical, chart, labs and discussed the procedure including the risks, benefits and alternatives for the proposed anesthesia with the patient or authorized representative who has indicated his/her understanding and acceptance.   Dental advisory given  Plan Discussed with: CRNA, Anesthesiologist and Surgeon  Anesthesia Plan Comments:         Anesthesia Quick Evaluation

## 2014-09-13 ENCOUNTER — Ambulatory Visit: Payer: 59

## 2014-09-13 ENCOUNTER — Encounter (HOSPITAL_BASED_OUTPATIENT_CLINIC_OR_DEPARTMENT_OTHER): Payer: Self-pay | Admitting: Surgery

## 2014-09-13 LAB — POCT I-STAT, CHEM 8
BUN: 29 mg/dL — ABNORMAL HIGH (ref 6–23)
Calcium, Ion: 1.15 mmol/L (ref 1.12–1.23)
Chloride: 105 mEq/L (ref 96–112)
Creatinine, Ser: 1.6 mg/dL — ABNORMAL HIGH (ref 0.50–1.10)
Glucose, Bld: 127 mg/dL — ABNORMAL HIGH (ref 70–99)
HCT: 39 % (ref 36.0–46.0)
HEMOGLOBIN: 13.3 g/dL (ref 12.0–15.0)
POTASSIUM: 4.3 meq/L (ref 3.7–5.3)
Sodium: 137 mEq/L (ref 137–147)
TCO2: 20 mmol/L (ref 0–100)

## 2014-09-13 NOTE — Op Note (Signed)
NAMESENETRA, DILLIN NO.:  [id number]  MEDICAL RECORD NO.:  [id number]  LOCATION:                                 FACILITY:  PHYSICIAN:  Coralie Keens, M.D. DATE OF BIRTH:  [birth year]  DATE OF PROCEDURE:  09/12/2014 DATE OF DISCHARGE:  09/12/2014                              OPERATIVE REPORT   PREOPERATIVE DIAGNOSIS:  Incisional hernia.  POSTOPERATIVE DIAGNOSIS:  Incisional hernia.  PROCEDURE:  Incisional hernia repair with mesh.  SURGEON:  Coralie Keens, M.D.  ANESTHESIA:  General and 0.5% Marcaine.  ESTIMATED BLOOD LOSS:  Minimal.  INDICATIONS:  This is a 53 year old female who has had a previous robotic hysterectomy.  She has now developed a hernia at the port site above the umbilicus.  Decision has been made to proceed with repair.  FINDINGS:  The patient was found to have a moderate size fascial defect with colon which was reduced from the hernia.  It was the transverse colon that was involved.  A repair of the hernia with an 8 cm round Ventralight patch.  PROCEDURE IN DETAIL:  The patient was brought to the operating room, identified as Joann [name].  She was placed supine on the operating table and general anesthesia was induced.  Her abdomen was then prepped and draped in usual sterile fashion.  I excised the previous scar with a scalpel.  I took this down to the hernia sac which I then dissected free circumferentially from the surrounding tissue down to the fascial opening.  I then excised the sac in its entirety and reduced the transverse colon.  It was stuck in the hernia back into the abdominal cavity.  I also freed up the omentum from the fascial edges.  Once this was done, I identified the fascia circumferentially.  I brought an 8 cm ventral patch on this field and placed it through the fascial opening and pulled up against the peritoneum with stay ties.  I then sewed the mesh circumferentially with interrupted #1 Novafil sutures.   I then cut the stay ties and closed the fascia over the top of the mesh with interrupted #1 Novafil sutures as well.  Good coverage of the fascial defect appeared to be achieved.  I anesthetized the fascia and skin with Marcaine and irrigated the wound with saline.  I then closed the subcutaneous tissue with interrupted 3-0 Vicryl sutures and closed the skin with a running 4-0 Monocryl.  Steri-Strips, gauze, and Tegaderm were then applied.  The patient tolerated the procedure well.  All the counts were correct at the end of procedure.  The patient was then extubated in the operating room and taken in stable condition to recovery room.     Coralie Keens, M.D.     DB/MEDQ  D:  09/12/2014  T:  09/12/2014  Job:  [id number]

## 2014-12-10 ENCOUNTER — Other Ambulatory Visit (HOSPITAL_COMMUNITY): Payer: Self-pay | Admitting: Internal Medicine

## 2014-12-10 ENCOUNTER — Ambulatory Visit (HOSPITAL_COMMUNITY)
Admission: RE | Admit: 2014-12-10 | Discharge: 2014-12-10 | Disposition: A | Discharge status: Home / Self Care | Payer: 59 | Source: Ambulatory Visit | Attending: Internal Medicine | Admitting: Internal Medicine

## 2014-12-10 DIAGNOSIS — N200 Calculus of kidney: Secondary | ICD-10-CM | POA: Insufficient documentation

## 2014-12-10 DIAGNOSIS — R1012 Left upper quadrant pain: Secondary | ICD-10-CM

## 2014-12-31 ENCOUNTER — Other Ambulatory Visit (HOSPITAL_COMMUNITY): Payer: Self-pay | Admitting: Urology

## 2014-12-31 ENCOUNTER — Ambulatory Visit (HOSPITAL_COMMUNITY)
Admission: RE | Admit: 2014-12-31 | Discharge: 2014-12-31 | Disposition: A | Discharge status: Home / Self Care | Payer: 59 | Source: Ambulatory Visit | Attending: Urology | Admitting: Urology

## 2014-12-31 DIAGNOSIS — Z85528 Personal history of other malignant neoplasm of kidney: Secondary | ICD-10-CM | POA: Insufficient documentation

## 2014-12-31 DIAGNOSIS — E119 Type 2 diabetes mellitus without complications: Secondary | ICD-10-CM | POA: Insufficient documentation

## 2014-12-31 DIAGNOSIS — I10 Essential (primary) hypertension: Secondary | ICD-10-CM | POA: Insufficient documentation

## 2015-02-05 ENCOUNTER — Other Ambulatory Visit: Payer: Self-pay

## 2015-02-05 NOTE — Patient Outreach (Signed)
[facility]) Care Management  02/05/2015  Joann [name] 03-24-1961   Patient did not show for their scheduled visit.   Gentry Fitz, RN, BA, [place], [place] Direct Dial:  [phone]  Fax:  [phone] E-mail: [email or web address] [address] [place], [place]  [address]

## 2015-02-27 ENCOUNTER — Encounter (HOSPITAL_COMMUNITY): Payer: Self-pay | Admitting: Family Medicine

## 2015-02-27 ENCOUNTER — Emergency Department (HOSPITAL_COMMUNITY): Payer: 59

## 2015-02-27 ENCOUNTER — Inpatient Hospital Stay (HOSPITAL_COMMUNITY)
Admission: EM | Admit: 2015-02-27 | Discharge: 2015-03-02 | DRG: 196 | Disposition: A | Discharge status: Home / Self Care | Payer: 59 | Attending: Internal Medicine | Admitting: Internal Medicine

## 2015-02-27 DIAGNOSIS — D49519 Neoplasm of unspecified behavior of unspecified kidney: Secondary | ICD-10-CM

## 2015-02-27 DIAGNOSIS — E119 Type 2 diabetes mellitus without complications: Secondary | ICD-10-CM | POA: Diagnosis present

## 2015-02-27 DIAGNOSIS — Z90710 Acquired absence of both cervix and uterus: Secondary | ICD-10-CM

## 2015-02-27 DIAGNOSIS — Z888 Allergy status to other drugs, medicaments and biological substances status: Secondary | ICD-10-CM

## 2015-02-27 DIAGNOSIS — Z905 Acquired absence of kidney: Secondary | ICD-10-CM | POA: Diagnosis present

## 2015-02-27 DIAGNOSIS — Z9851 Tubal ligation status: Secondary | ICD-10-CM

## 2015-02-27 DIAGNOSIS — J8410 Pulmonary fibrosis, unspecified: Secondary | ICD-10-CM

## 2015-02-27 DIAGNOSIS — E039 Hypothyroidism, unspecified: Secondary | ICD-10-CM | POA: Diagnosis present

## 2015-02-27 DIAGNOSIS — J84112 Idiopathic pulmonary fibrosis: Principal | ICD-10-CM | POA: Diagnosis present

## 2015-02-27 DIAGNOSIS — Z9049 Acquired absence of other specified parts of digestive tract: Secondary | ICD-10-CM | POA: Diagnosis present

## 2015-02-27 DIAGNOSIS — Z882 Allergy status to sulfonamides status: Secondary | ICD-10-CM

## 2015-02-27 DIAGNOSIS — Z91040 Latex allergy status: Secondary | ICD-10-CM

## 2015-02-27 DIAGNOSIS — G4730 Sleep apnea, unspecified: Secondary | ICD-10-CM | POA: Diagnosis present

## 2015-02-27 DIAGNOSIS — J849 Interstitial pulmonary disease, unspecified: Secondary | ICD-10-CM

## 2015-02-27 DIAGNOSIS — Z85528 Personal history of other malignant neoplasm of kidney: Secondary | ICD-10-CM

## 2015-02-27 DIAGNOSIS — K219 Gastro-esophageal reflux disease without esophagitis: Secondary | ICD-10-CM | POA: Diagnosis present

## 2015-02-27 DIAGNOSIS — Z7951 Long term (current) use of inhaled steroids: Secondary | ICD-10-CM

## 2015-02-27 DIAGNOSIS — R0602 Shortness of breath: Secondary | ICD-10-CM | POA: Diagnosis not present

## 2015-02-27 DIAGNOSIS — R0902 Hypoxemia: Secondary | ICD-10-CM

## 2015-02-27 DIAGNOSIS — J45909 Unspecified asthma, uncomplicated: Secondary | ICD-10-CM | POA: Diagnosis present

## 2015-02-27 DIAGNOSIS — J9621 Acute and chronic respiratory failure with hypoxia: Secondary | ICD-10-CM | POA: Diagnosis present

## 2015-02-27 DIAGNOSIS — I10 Essential (primary) hypertension: Secondary | ICD-10-CM | POA: Diagnosis present

## 2015-02-27 DIAGNOSIS — Z794 Long term (current) use of insulin: Secondary | ICD-10-CM

## 2015-02-27 DIAGNOSIS — R079 Chest pain, unspecified: Secondary | ICD-10-CM

## 2015-02-27 DIAGNOSIS — F329 Major depressive disorder, single episode, unspecified: Secondary | ICD-10-CM | POA: Diagnosis present

## 2015-02-27 LAB — CBC
HCT: 36 % (ref 36.0–46.0)
Hemoglobin: 11.7 g/dL — ABNORMAL LOW (ref 12.0–15.0)
MCH: 28.1 pg (ref 26.0–34.0)
MCHC: 32.5 g/dL (ref 30.0–36.0)
MCV: 86.5 fL (ref 78.0–100.0)
PLATELETS: 329 10*3/uL (ref 150–400)
RBC: 4.16 MIL/uL (ref 3.87–5.11)
RDW: 14.8 % (ref 11.5–15.5)
WBC: 9.6 10*3/uL (ref 4.0–10.5)

## 2015-02-27 LAB — BASIC METABOLIC PANEL
Anion gap: 11 (ref 5–15)
BUN: 15 mg/dL (ref 6–23)
CALCIUM: 9.2 mg/dL (ref 8.4–10.5)
CO2: 21 mmol/L (ref 19–32)
Chloride: 105 mmol/L (ref 96–112)
Creatinine, Ser: 1.21 mg/dL — ABNORMAL HIGH (ref 0.50–1.10)
GFR, EST AFRICAN AMERICAN: 58 mL/min — AB (ref >90)
GFR, EST NON AFRICAN AMERICAN: 50 mL/min — AB (ref >90)
GLUCOSE: 183 mg/dL — AB (ref 70–99)
POTASSIUM: 4.5 mmol/L (ref 3.5–5.1)
SODIUM: 137 mmol/L (ref 135–145)

## 2015-02-27 LAB — D-DIMER, QUANTITATIVE: D-Dimer, Quant: 0.96 ug/mL-FEU — ABNORMAL HIGH (ref 0.00–0.48)

## 2015-02-27 LAB — I-STAT TROPONIN, ED: Troponin i, poc: 0 ng/mL (ref 0.00–0.08)

## 2015-02-27 LAB — BRAIN NATRIURETIC PEPTIDE: B NATRIURETIC PEPTIDE 5: 27.7 pg/mL (ref 0.0–100.0)

## 2015-02-27 MED ORDER — PREDNISONE 20 MG PO TABS
60.0000 mg | ORAL_TABLET | Freq: Once | ORAL | Status: AC
  Administered 2015-02-27: 60 mg via ORAL
  Filled 2015-02-27: qty 3

## 2015-02-27 MED ORDER — IPRATROPIUM-ALBUTEROL 0.5-2.5 (3) MG/3ML IN SOLN
3.0000 mL | Freq: Once | RESPIRATORY_TRACT | Status: AC
  Administered 2015-02-27: 3 mL via RESPIRATORY_TRACT
  Filled 2015-02-27: qty 3

## 2015-02-27 MED ORDER — IPRATROPIUM-ALBUTEROL 0.5-2.5 (3) MG/3ML IN SOLN
3.0000 mL | RESPIRATORY_TRACT | Status: DC
  Administered 2015-02-28: 3 mL via RESPIRATORY_TRACT
  Filled 2015-02-27: qty 3

## 2015-02-27 NOTE — ED Provider Notes (Signed)
CSN: [id number]     Arrival date & time 02/27/15  2030 History   First MD Initiated Contact with Patient 02/27/15 2109     Chief Complaint  Patient presents with  . Tachycardia  . Shortness of Breath     (Consider location/radiation/quality/duration/timing/severity/associated sxs/prior Treatment) HPI Comments: Patient is a 54 year old female past medical history significant for asthma, DM, fibromyalgia, GERD, IBS, anemia, depression, hypertension presenting to the emergency department for 2 weeks of gradually worsening shortness of breath with associated palpitations. No precipitating factors identified. Denies any aggravating factors. Patient states her symptoms become more constant over the last few days. Endorses associated nonproductive cough with associated nasal congestion and rhinorrhea. Also endorses lower extremity edema.   Past Medical History  Diagnosis Date  . Asthma   . Diabetes mellitus   . Headache(784.0)     MIGRAINES  . Fibromyalgia   . GERD (gastroesophageal reflux disease)   . IBS (irritable bowel syndrome)   . Renal mass, right   . IC (interstitial cystitis)   . Frequency of urination   . Anemia   . Swelling of both lower extremities     TAKES MAXIDE FOR SWELLING (DENIES HBP)  . Hypothyroidism   . Depression   . Cancer     kidney  . Hypertension   . Wears glasses   . Sleep apnea     uses a cpap   Past Surgical History  Procedure Laterality Date  . Cesarean section  1983, 1985     x 2  . Bladder tack    . Cystocopy    . Cystoscopy    . Robotic assited partial nephrectomy Right 12/14/2013    Procedure: ROBOTIC ASSITED PARTIAL NEPHRECTOMY;  Surgeon: Dutch Gray, MD;  Location: [facility];  Service: Urology;  Laterality: Right;  . Cystoscopy w/ ureteral stent placement Right 12/14/2013    Procedure: CYSTOSCOPY WITH RETROGRADE PYELOGRAM/URETERAL STENT PLACEMENT;  Surgeon: Dutch Gray, MD;  Location: [facility];  Service: Urology;  Laterality: Right;  . Cervical  laminectomy  07/2012    anterior with plates and screws.  . Abdominal hysterectomy  1996  . Cholecystectomy  1995    tubal ligation also  . Posterior cervical laminectomy  2004  . Tubal ligation    . Colonoscopy    . Incisional hernia repair N/A 09/12/2014    Procedure: HERNIA REPAIR INCISIONAL;  Surgeon: Coralie Keens, MD;  Location: [place];  Service: General;  Laterality: N/A;  . Insertion of mesh N/A 09/12/2014    Procedure: INSERTION OF MESH;  Surgeon: Coralie Keens, MD;  Location: [place];  Service: General;  Laterality: N/A;   Family History  Problem Relation Age of Onset  . Cancer Mother     breast, lung  . COPD Father   . Hypertension Father   . Hyperlipidemia Father   . Stroke Father    History  Substance Use Topics  . Smoking status: Never Smoker   . Smokeless tobacco: Not on file  . Alcohol Use: Yes     Comment: occasional   OB History    No data available     Review of Systems  Constitutional: Negative for fever.  Respiratory: Positive for shortness of breath.   Cardiovascular: Positive for palpitations and leg swelling. Negative for chest pain.  Gastrointestinal: Negative for nausea, vomiting and diarrhea.  All other systems reviewed and are negative.     Allergies  Peanut-containing drug products; Sulfa antibiotics; Betadine; and Latex  Home Medications   Prior to Admission medications   Medication Sig Start Date End Date Taking? Authorizing Provider  albuterol (PROVENTIL HFA;VENTOLIN HFA) 108 (90 BASE) MCG/ACT inhaler Inhale 2 puffs into the lungs every 6 (six) hours as needed. For shortness of breath   Yes Historical Provider, MD  ALPRAZolam Duanne Moron) 0.5 MG tablet Take 0.5 mg by mouth daily as needed for anxiety.    Yes Historical Provider, MD  amLODipine (NORVASC) 5 MG tablet Take 5 mg by mouth daily.   Yes Historical Provider, MD  cholecalciferol (VITAMIN D) 1000 UNITS tablet Take 1,000 Units by mouth daily.    Yes Historical Provider, MD  dicyclomine (BENTYL) 20 MG tablet Take 20 mg by mouth every 6 (six) hours.   Yes Historical Provider, MD  DULoxetine (CYMBALTA) 60 MG capsule Take 60 mg by mouth daily.   Yes Historical Provider, MD  esomeprazole (NEXIUM) 40 MG capsule Take 40 mg by mouth 2 (two) times daily before a meal.    Yes Historical Provider, MD  estrogens, conjugated, (PREMARIN) 0.625 MG tablet Take 0.625 mg by mouth daily.    Yes Historical Provider, MD  Fluticasone-Salmeterol (ADVAIR) 100-50 MCG/DOSE AEPB Inhale 1 puff into the lungs every 12 (twelve) hours.   Yes Historical Provider, MD  indomethacin (INDOCIN) 25 MG capsule Take 25 mg by mouth 2 (two) times daily with a meal.   Yes Historical Provider, MD  insulin aspart (NOVOLOG) 100 UNIT/ML injection Inject 15 Units into the skin daily as needed for high blood sugar (with largestmeal if sugar is over 200).   Yes Historical Provider, MD  insulin glargine (LANTUS) 100 UNIT/ML injection Inject 50 Units into the skin at bedtime.    Yes Historical Provider, MD  levothyroxine (SYNTHROID, LEVOTHROID) 200 MCG tablet Take 200 mcg by mouth daily before breakfast.   Yes Historical Provider, MD  lisinopril (PRINIVIL,ZESTRIL) 20 MG tablet Take 20 mg by mouth every morning.    Yes Historical Provider, MD  meclizine (ANTIVERT) 25 MG tablet Take 25 mg by mouth 3 (three) times daily as needed for dizziness.   Yes Historical Provider, MD  metFORMIN (GLUCOPHAGE-XR) 500 MG 24 hr tablet Take 1,000 mg by mouth 2 (two) times daily.   Yes Historical Provider, MD  ondansetron (ZOFRAN) 4 MG tablet Take 4 mg by mouth every 8 (eight) hours as needed for nausea or vomiting.   Yes Historical Provider, MD  potassium chloride (K-DUR) 10 MEQ tablet Take 10 mEq by mouth 2 (two) times daily.   Yes Historical Provider, MD  topiramate (TOPAMAX) 25 MG tablet Take 25 mg by mouth 2 (two) times daily.   Yes Historical Provider, MD  traZODone (DESYREL) 150 MG tablet Take 75 mg  by mouth at bedtime.   Yes Historical Provider, MD  triamterene-hydrochlorothiazide (MAXZIDE-25) 37.5-25 MG per tablet Take 1 tablet by mouth 2 (two) times daily.    Yes Historical Provider, MD  vitamin E 400 UNIT capsule Take 800 Units by mouth daily.   Yes Historical Provider, MD   BP 137/80 mmHg  Pulse 100  Temp(Src) 98.1 F (36.7 C) (Oral)  Resp 29  Wt 189 lb 13.1 oz (86.1 kg)  SpO2 100% Physical Exam  Constitutional: She is oriented to person, place, and time. She appears well-developed and well-nourished. Nasal cannula in place.  HENT:  Head: Normocephalic and atraumatic.  Right Ear: External ear normal.  Left Ear: External ear normal.  Nose: Nose normal.  Eyes: Conjunctivae are normal.  Neck: Neck supple.  Cardiovascular: Regular rhythm and normal heart sounds.  Tachycardia present.   Pulmonary/Chest: Accessory muscle usage present. Tachypnea noted. She has decreased breath sounds. She has no wheezes.  Abdominal: Soft.  Musculoskeletal: Normal range of motion. She exhibits no edema.  Neurological: She is alert and oriented to person, place, and time.  Skin: Skin is warm and dry.  Nursing note and vitals reviewed.   ED Course  Procedures (including critical care time) Medications  ipratropium-albuterol (DUONEB) 0.5-2.5 (3) MG/3ML nebulizer solution 3 mL (3 mLs Nebulization Given 02/28/15 0026)  heparin injection 5,000 Units (not administered)  ALPRAZolam (XANAX) tablet 0.5 mg (not administered)  amLODipine (NORVASC) tablet 5 mg (not administered)  cholecalciferol (VITAMIN D) tablet 1,000 Units (not administered)  DULoxetine (CYMBALTA) DR capsule 60 mg (not administered)  pantoprazole (PROTONIX) EC tablet 80 mg (not administered)  estrogens (conjugated) (PREMARIN) tablet 0.625 mg (not administered)  levothyroxine (SYNTHROID, LEVOTHROID) tablet 200 mcg (not administered)  lisinopril (PRINIVIL,ZESTRIL) tablet 20 mg (not administered)  indomethacin (INDOCIN) capsule 25 mg  (not administered)  mometasone-formoterol (DULERA) 100-5 MCG/ACT inhaler 2 puff (not administered)  metFORMIN (GLUCOPHAGE-XR) 24 hr tablet 1,000 mg (not administered)  ondansetron (ZOFRAN) tablet 4 mg (not administered)  meclizine (ANTIVERT) tablet 25 mg (not administered)  topiramate (TOPAMAX) tablet 25 mg (not administered)  potassium chloride (K-DUR) CR tablet 10 mEq (not administered)  traZODone (DESYREL) tablet 75 mg (not administered)  triamterene-hydrochlorothiazide (MAXZIDE-25) 37.5-25 MG per tablet 1 tablet (not administered)  insulin aspart (novoLOG) injection 0-20 Units (not administered)  insulin glargine (LANTUS) injection 40 Units (not administered)  albuterol (PROVENTIL) (2.5 MG/3ML) 0.083% nebulizer solution 2.5 mg (not administered)  dicyclomine (BENTYL) tablet 20 mg (not administered)  ipratropium-albuterol (DUONEB) 0.5-2.5 (3) MG/3ML nebulizer solution 3 mL (3 mLs Nebulization Given 02/27/15 2211)  ipratropium-albuterol (DUONEB) 0.5-2.5 (3) MG/3ML nebulizer solution 3 mL (3 mLs Nebulization Given 02/27/15 2340)  predniSONE (DELTASONE) tablet 60 mg (60 mg Oral Given 02/27/15 2338)  iohexol (OMNIPAQUE) 350 MG/ML injection 100 mL (70 mLs Intravenous Contrast Given 02/28/15 0007)    Labs Review Labs Reviewed  CBC - Abnormal; Notable for the following:    Hemoglobin 11.7 (*)    All other components within normal limits  BASIC METABOLIC PANEL - Abnormal; Notable for the following:    Glucose, Bld 183 (*)    Creatinine, Ser 1.21 (*)    GFR calc non Af Amer 50 (*)    GFR calc Af Amer 58 (*)    All other components within normal limits  D-DIMER, QUANTITATIVE - Abnormal; Notable for the following:    D-Dimer, Quant 0.96 (*)    All other components within normal limits  BRAIN NATRIURETIC PEPTIDE  ANA, BODY FLUID  RHEUMATOID FACTOR  SJOGRENS SYNDROME-A EXTRACTABLE NUCLEAR ANTIBODY  SJOGRENS SYNDROME-B EXTRACTABLE NUCLEAR ANTIBODY  ALDOLASE  JO-1 ANTIBODY-IGG   ANTI-SCLERODERMA ANTIBODY  CYCLIC CITRUL PEPTIDE ANTIBODY, IGG/IGA  I-STAT [place], ED    Imaging Review Dg Chest 2 View  02/27/2015   CLINICAL DATA:  Short of breath. Intermittent tachycardia over the last several weeks.  EXAM: CHEST  2 VIEW  COMPARISON:  12/31/2014.  06/25/2014.  FINDINGS: The heart is mildly enlarged. Mediastinal shadows are normal. There is chronic lung disease with a diffuse fibrotic pattern and and bronchial thickening. Markings in general are more prominent which could indicate progressive fibrosis or superimposed widespread inflammation. No sign of acute consolidation, collapse or effusion.  IMPRESSION: Chronically progressive fibrotic pattern. Increased markings could be associated with widespread bronchitis. No evidence of  dense consolidation or lobar collapse. No effusions.   Electronically Signed   By: Nelson Chimes M.D.   On: 02/27/2015 21:22   Ct Angio Chest Pe W/cm &/or Wo Cm  02/28/2015   CLINICAL DATA:  Tachycardia and shortness of breath, worsening over the past 2 weeks.  EXAM: CT ANGIOGRAPHY CHEST WITH CONTRAST  TECHNIQUE: Multidetector CT imaging of the chest was performed using the standard protocol during bolus administration of intravenous contrast. Multiplanar CT image reconstructions and MIPs were obtained to evaluate the vascular anatomy.  CONTRAST:  34mL OMNIPAQUE IOHEXOL 350 MG/ML SOLN  COMPARISON:  None.  FINDINGS: THORACIC INLET/BODY WALL:  No acute abnormality.  MEDIASTINUM:  Normal heart size. No pericardial effusion. Bilateral hilar and mediastinal lymphadenopathy, likely related to the pulmonary fibrosis. CTA of the pulmonary arteries is limited by intermittent motion but exam is overall diagnostic and negative for pulmonary embolism.  LUNG WINDOWS:  Low lung volumes with coarse interstitial opacities, traction bronchiectasis, and honeycombing at the bases. There is subpleural predominance and no clear craniocaudal gradient. No gross pneumonia is  identified. Patchy ground-glass density could reflect active alveolitis (versus atelectasis).  UPPER ABDOMEN:  Hepatic steatosis and probable enlargement.  Cholecystectomy.  OSSEOUS:  No acute fracture.  No suspicious lytic or blastic lesions.  Review of the MIP images confirms the above findings.  IMPRESSION: 1. No evidence of pulmonary embolism. 2. Pulmonary fibrosis with a usual interstitial pneumonia pattern. By x-ray, fibrosis has increased over the past year; patchy ground-glass density on the current study may reflect active alveolitis. 3. Mediastinal and hilar lymphadenopathy, nonspecific but likely reactive to #2. 4. Hepatic steatosis.   Electronically Signed   By: Monte Fantasia M.D.   On: 02/28/2015 00:32     EKG Interpretation None      Patient with minimal improvement after three Duonebs and prednisone administration. No wheezing on examination after Duoneb administration.  Patient unable to maintain O2 saturations > 90% on RA, new 2L requirement.     CRITICAL CARE Performed by: Baron Sane L   Total critical care time: 35 minutes  Critical care time was exclusive of separately billable procedures and treating other patients.  Critical care was necessary to treat or prevent imminent or life-threatening deterioration.  Critical care was time spent personally by me on the following activities: development of treatment plan with patient and/or surrogate as well as nursing, discussions with consultants, evaluation of patient's response to treatment, examination of patient, obtaining history from patient or surrogate, ordering and performing treatments and interventions, ordering and review of laboratory studies, ordering and review of radiographic studies, pulse oximetry and re-evaluation of patient's condition.  MDM   Final diagnoses:  Hypoxia    Filed Vitals:   02/28/15 0325  BP: 137/80  Pulse: 100  Temp: 98.1 F (36.7 C)  Resp:    I have reviewed nursing  notes, vital signs, and all appropriate lab and imaging results for this patient.  Patient with tachypnea, diminished breath sounds and new Oxygen requirement. Labs reviewed. X-ray reviewed. CT angios chest reviewed. Minimal improvement with DuoNeb administration. Given continued hypoxia with tachypnea and tachycardia will admit patient to hospitalist for further management and evaluation. Patient d/w with Dr. Wyvonnia Dusky, agrees with plan.      Baron Sane, PA-C 02/28/15 [place], MD 02/28/15 [phone]

## 2015-02-27 NOTE — ED Notes (Signed)
Pt here for intermittent tachycardia, SOB pver the past few weeks. Worse with exertion. Denies pain.

## 2015-02-28 ENCOUNTER — Emergency Department (HOSPITAL_COMMUNITY): Payer: 59

## 2015-02-28 ENCOUNTER — Encounter (HOSPITAL_COMMUNITY): Payer: Self-pay

## 2015-02-28 ENCOUNTER — Inpatient Hospital Stay (HOSPITAL_COMMUNITY): Payer: 59

## 2015-02-28 DIAGNOSIS — E119 Type 2 diabetes mellitus without complications: Secondary | ICD-10-CM | POA: Diagnosis not present

## 2015-02-28 DIAGNOSIS — Z90710 Acquired absence of both cervix and uterus: Secondary | ICD-10-CM | POA: Diagnosis not present

## 2015-02-28 DIAGNOSIS — R0602 Shortness of breath: Secondary | ICD-10-CM | POA: Diagnosis present

## 2015-02-28 DIAGNOSIS — J45909 Unspecified asthma, uncomplicated: Secondary | ICD-10-CM | POA: Diagnosis present

## 2015-02-28 DIAGNOSIS — Z888 Allergy status to other drugs, medicaments and biological substances status: Secondary | ICD-10-CM | POA: Diagnosis not present

## 2015-02-28 DIAGNOSIS — F329 Major depressive disorder, single episode, unspecified: Secondary | ICD-10-CM | POA: Diagnosis present

## 2015-02-28 DIAGNOSIS — E039 Hypothyroidism, unspecified: Secondary | ICD-10-CM | POA: Diagnosis present

## 2015-02-28 DIAGNOSIS — Z9049 Acquired absence of other specified parts of digestive tract: Secondary | ICD-10-CM | POA: Diagnosis present

## 2015-02-28 DIAGNOSIS — Z794 Long term (current) use of insulin: Secondary | ICD-10-CM | POA: Diagnosis not present

## 2015-02-28 DIAGNOSIS — J849 Interstitial pulmonary disease, unspecified: Secondary | ICD-10-CM | POA: Diagnosis present

## 2015-02-28 DIAGNOSIS — J84112 Idiopathic pulmonary fibrosis: Principal | ICD-10-CM | POA: Diagnosis present

## 2015-02-28 DIAGNOSIS — Z91040 Latex allergy status: Secondary | ICD-10-CM | POA: Diagnosis not present

## 2015-02-28 DIAGNOSIS — J9621 Acute and chronic respiratory failure with hypoxia: Secondary | ICD-10-CM

## 2015-02-28 DIAGNOSIS — Z905 Acquired absence of kidney: Secondary | ICD-10-CM | POA: Diagnosis present

## 2015-02-28 DIAGNOSIS — I10 Essential (primary) hypertension: Secondary | ICD-10-CM

## 2015-02-28 DIAGNOSIS — J9600 Acute respiratory failure, unspecified whether with hypoxia or hypercapnia: Secondary | ICD-10-CM | POA: Diagnosis not present

## 2015-02-28 DIAGNOSIS — Z7951 Long term (current) use of inhaled steroids: Secondary | ICD-10-CM | POA: Diagnosis not present

## 2015-02-28 DIAGNOSIS — Z85528 Personal history of other malignant neoplasm of kidney: Secondary | ICD-10-CM | POA: Diagnosis not present

## 2015-02-28 DIAGNOSIS — Z882 Allergy status to sulfonamides status: Secondary | ICD-10-CM | POA: Diagnosis not present

## 2015-02-28 DIAGNOSIS — G4730 Sleep apnea, unspecified: Secondary | ICD-10-CM | POA: Diagnosis present

## 2015-02-28 DIAGNOSIS — Z9851 Tubal ligation status: Secondary | ICD-10-CM | POA: Diagnosis not present

## 2015-02-28 DIAGNOSIS — K219 Gastro-esophageal reflux disease without esophagitis: Secondary | ICD-10-CM | POA: Diagnosis present

## 2015-02-28 LAB — BASIC METABOLIC PANEL
Anion gap: 13 (ref 5–15)
BUN: 19 mg/dL (ref 6–23)
CHLORIDE: 102 mmol/L (ref 96–112)
CO2: 19 mmol/L (ref 19–32)
Calcium: 9.2 mg/dL (ref 8.4–10.5)
Creatinine, Ser: 1.32 mg/dL — ABNORMAL HIGH (ref 0.50–1.10)
GFR calc Af Amer: 52 mL/min — ABNORMAL LOW (ref >90)
GFR calc non Af Amer: 45 mL/min — ABNORMAL LOW (ref >90)
Glucose, Bld: 238 mg/dL — ABNORMAL HIGH (ref 70–99)
POTASSIUM: 5.1 mmol/L (ref 3.5–5.1)
Sodium: 134 mmol/L — ABNORMAL LOW (ref 135–145)

## 2015-02-28 LAB — SEDIMENTATION RATE: SED RATE: 80 mm/h — AB (ref 0–22)

## 2015-02-28 LAB — GLUCOSE, CAPILLARY
Glucose-Capillary: 198 mg/dL — ABNORMAL HIGH (ref 70–99)
Glucose-Capillary: 228 mg/dL — ABNORMAL HIGH (ref 70–99)
Glucose-Capillary: 206 mg/dL — ABNORMAL HIGH (ref 70–99)
Glucose-Capillary: 141 mg/dL — ABNORMAL HIGH (ref 70–99)
Glucose-Capillary: 164 mg/dL — ABNORMAL HIGH (ref 70–99)

## 2015-02-28 LAB — JO-1 ANTIBODY-IGG: Jo-1 Antibody, IgG: <1

## 2015-02-28 LAB — CK TOTAL AND CKMB (NOT AT ARMC)
CK, MB: 1.9 ng/mL (ref 0.3–4.0)
Relative Index: 1.3 (ref 0.0–2.5)
Total CK: 148 U/L (ref 7–177)

## 2015-02-28 LAB — ANTI-SCLERODERMA ANTIBODY: SCLERODERMA (SCL-70) (ENA) ANTIBODY, IGG: NEGATIVE

## 2015-02-28 LAB — BRAIN NATRIURETIC PEPTIDE: B Natriuretic Peptide: 66.7 pg/mL (ref 0.0–100.0)

## 2015-02-28 LAB — SJOGRENS SYNDROME-A EXTRACTABLE NUCLEAR ANTIBODY: SSA (Ro) (ENA) Antibody, IgG: <1

## 2015-02-28 LAB — SJOGRENS SYNDROME-B EXTRACTABLE NUCLEAR ANTIBODY: SSB (LA) (ENA) ANTIBODY, IGG: NEGATIVE

## 2015-02-28 MED ORDER — TOPIRAMATE 25 MG PO TABS
25.0000 mg | ORAL_TABLET | Freq: Two times a day (BID) | ORAL | Status: DC
  Administered 2015-02-28 – 2015-03-02 (×6): 25 mg via ORAL
  Filled 2015-02-28 (×6): qty 1

## 2015-02-28 MED ORDER — PANTOPRAZOLE SODIUM 40 MG PO TBEC
80.0000 mg | DELAYED_RELEASE_TABLET | Freq: Every day | ORAL | Status: DC
Start: 2015-02-28 — End: 2015-03-02
  Administered 2015-02-28 – 2015-03-02 (×3): 80 mg via ORAL
  Filled 2015-02-28 (×3): qty 2

## 2015-02-28 MED ORDER — ACETAMINOPHEN 325 MG PO TABS
650.0000 mg | ORAL_TABLET | ORAL | Status: DC | PRN
  Administered 2015-02-28: 650 mg via ORAL
  Filled 2015-02-28: qty 2

## 2015-02-28 MED ORDER — DULOXETINE HCL 60 MG PO CPEP
60.0000 mg | ORAL_CAPSULE | Freq: Every day | ORAL | Status: DC
  Administered 2015-02-28 – 2015-03-02 (×3): 60 mg via ORAL
  Filled 2015-02-28 (×3): qty 1

## 2015-02-28 MED ORDER — METFORMIN HCL ER 500 MG PO TB24
1000.0000 mg | ORAL_TABLET | Freq: Two times a day (BID) | ORAL | Status: DC
  Administered 2015-02-28: 1000 mg via ORAL
  Filled 2015-02-28 (×3): qty 2

## 2015-02-28 MED ORDER — INDOMETHACIN 25 MG PO CAPS
25.0000 mg | ORAL_CAPSULE | Freq: Two times a day (BID) | ORAL | Status: DC
Start: 2015-02-28 — End: 2015-03-02
  Administered 2015-02-28 – 2015-03-02 (×5): 25 mg via ORAL
  Filled 2015-02-28 (×7): qty 1

## 2015-02-28 MED ORDER — POTASSIUM CHLORIDE ER 10 MEQ PO TBCR
10.0000 meq | EXTENDED_RELEASE_TABLET | Freq: Two times a day (BID) | ORAL | Status: DC
  Administered 2015-02-28: 10 meq via ORAL
  Filled 2015-02-28 (×2): qty 1

## 2015-02-28 MED ORDER — INSULIN GLARGINE 100 UNIT/ML ~~LOC~~ SOLN
43.0000 [IU] | Freq: Every day | SUBCUTANEOUS | Status: DC
  Administered 2015-02-28 – 2015-03-01 (×2): 43 [IU] via SUBCUTANEOUS
  Filled 2015-02-28 (×3): qty 0.43

## 2015-02-28 MED ORDER — HYDROCODONE-ACETAMINOPHEN 5-325 MG PO TABS
1.0000 | ORAL_TABLET | ORAL | Status: DC | PRN
  Administered 2015-02-28 – 2015-03-01 (×2): 2 via ORAL
  Filled 2015-02-28 (×3): qty 2

## 2015-02-28 MED ORDER — IOHEXOL 350 MG/ML SOLN
100.0000 mL | Freq: Once | INTRAVENOUS | Status: AC | PRN
  Administered 2015-02-28: 70 mL via INTRAVENOUS

## 2015-02-28 MED ORDER — ONDANSETRON HCL 4 MG PO TABS: 4.0000 mg | ORAL_TABLET | Freq: Three times a day (TID) | ORAL | Status: DC | PRN

## 2015-02-28 MED ORDER — CETYLPYRIDINIUM CHLORIDE 0.05 % MT LIQD
7.0000 mL | Freq: Two times a day (BID) | OROMUCOSAL | Status: DC
Start: 2015-02-28 — End: 2015-03-02
  Administered 2015-02-28 – 2015-03-02 (×3): 7 mL via OROMUCOSAL

## 2015-02-28 MED ORDER — INSULIN ASPART 100 UNIT/ML ~~LOC~~ SOLN: 0.0000 [IU] | Freq: Three times a day (TID) | SUBCUTANEOUS | Status: DC

## 2015-02-28 MED ORDER — HEPARIN SODIUM (PORCINE) 5000 UNIT/ML IJ SOLN
5000.0000 [IU] | Freq: Three times a day (TID) | INTRAMUSCULAR | Status: DC
  Administered 2015-02-28 – 2015-03-02 (×7): 5000 [IU] via SUBCUTANEOUS
  Filled 2015-02-28 (×10): qty 1

## 2015-02-28 MED ORDER — DICYCLOMINE HCL 20 MG PO TABS
20.0000 mg | ORAL_TABLET | Freq: Three times a day (TID) | ORAL | Status: DC
  Administered 2015-02-28 – 2015-03-02 (×10): 20 mg via ORAL
  Filled 2015-02-28 (×13): qty 1

## 2015-02-28 MED ORDER — VITAMIN D3 25 MCG (1000 UNIT) PO TABS
1000.0000 [IU] | ORAL_TABLET | Freq: Every day | ORAL | Status: DC
  Administered 2015-02-28 – 2015-03-02 (×3): 1000 [IU] via ORAL
  Filled 2015-02-28 (×3): qty 1

## 2015-02-28 MED ORDER — DICYCLOMINE HCL 20 MG PO TABS
20.0000 mg | ORAL_TABLET | Freq: Four times a day (QID) | ORAL | Status: DC
  Filled 2015-02-28: qty 1

## 2015-02-28 MED ORDER — IPRATROPIUM-ALBUTEROL 0.5-2.5 (3) MG/3ML IN SOLN
3.0000 mL | Freq: Three times a day (TID) | RESPIRATORY_TRACT | Status: DC
  Administered 2015-02-28 – 2015-03-02 (×7): 3 mL via RESPIRATORY_TRACT
  Filled 2015-02-28 (×7): qty 3

## 2015-02-28 MED ORDER — AMLODIPINE BESYLATE 5 MG PO TABS
5.0000 mg | ORAL_TABLET | Freq: Every day | ORAL | Status: DC
  Administered 2015-02-28 – 2015-03-02 (×3): 5 mg via ORAL
  Filled 2015-02-28 (×3): qty 1

## 2015-02-28 MED ORDER — INSULIN ASPART 100 UNIT/ML ~~LOC~~ SOLN: 0.0000 [IU] | Freq: Every day | SUBCUTANEOUS | Status: DC

## 2015-02-28 MED ORDER — MECLIZINE HCL 25 MG PO TABS
25.0000 mg | ORAL_TABLET | Freq: Three times a day (TID) | ORAL | Status: DC | PRN
  Filled 2015-02-28: qty 1

## 2015-02-28 MED ORDER — MOMETASONE FURO-FORMOTEROL FUM 100-5 MCG/ACT IN AERO
2.0000 | INHALATION_SPRAY | Freq: Two times a day (BID) | RESPIRATORY_TRACT | Status: DC
  Administered 2015-02-28 – 2015-03-02 (×4): 2 via RESPIRATORY_TRACT
  Filled 2015-02-28: qty 8.8

## 2015-02-28 MED ORDER — ESTROGENS CONJUGATED 0.625 MG PO TABS
0.6250 mg | ORAL_TABLET | Freq: Every day | ORAL | Status: DC
  Administered 2015-02-28 – 2015-03-02 (×3): 0.625 mg via ORAL
  Filled 2015-02-28 (×3): qty 1

## 2015-02-28 MED ORDER — ALBUTEROL SULFATE (2.5 MG/3ML) 0.083% IN NEBU: 2.5000 mg | INHALATION_SOLUTION | Freq: Four times a day (QID) | RESPIRATORY_TRACT | Status: DC | PRN

## 2015-02-28 MED ORDER — INSULIN ASPART 100 UNIT/ML ~~LOC~~ SOLN
0.0000 [IU] | Freq: Three times a day (TID) | SUBCUTANEOUS | Status: DC
  Administered 2015-02-28: 3 [IU] via SUBCUTANEOUS
  Administered 2015-02-28: 7 [IU] via SUBCUTANEOUS
  Administered 2015-02-28: 1 [IU] via SUBCUTANEOUS
  Administered 2015-03-01: 3 [IU] via SUBCUTANEOUS
  Administered 2015-03-01: 7 [IU] via SUBCUTANEOUS
  Administered 2015-03-01 – 2015-03-02 (×2): 4 [IU] via SUBCUTANEOUS
  Administered 2015-03-02: 7 [IU] via SUBCUTANEOUS

## 2015-02-28 MED ORDER — TRAZODONE 25 MG HALF TABLET
75.0000 mg | ORAL_TABLET | Freq: Every day | ORAL | Status: DC
  Administered 2015-02-28 – 2015-03-01 (×2): 75 mg via ORAL
  Filled 2015-02-28 (×3): qty 1

## 2015-02-28 MED ORDER — LISINOPRIL 20 MG PO TABS
20.0000 mg | ORAL_TABLET | Freq: Every morning | ORAL | Status: DC
  Administered 2015-02-28: 20 mg via ORAL
  Filled 2015-02-28: qty 1

## 2015-02-28 MED ORDER — LEVOTHYROXINE SODIUM 200 MCG PO TABS
200.0000 ug | ORAL_TABLET | Freq: Every day | ORAL | Status: DC
  Administered 2015-02-28 – 2015-03-02 (×3): 200 ug via ORAL
  Filled 2015-02-28 (×4): qty 1

## 2015-02-28 MED ORDER — ALBUTEROL SULFATE HFA 108 (90 BASE) MCG/ACT IN AERS: 2.0000 | INHALATION_SPRAY | Freq: Four times a day (QID) | RESPIRATORY_TRACT | Status: DC | PRN

## 2015-02-28 MED ORDER — ALPRAZOLAM 0.5 MG PO TABS
0.5000 mg | ORAL_TABLET | Freq: Every day | ORAL | Status: DC | PRN
Start: 2015-02-28 — End: 2015-03-02

## 2015-02-28 MED ORDER — INSULIN GLARGINE 100 UNIT/ML ~~LOC~~ SOLN: 50.0000 [IU] | Freq: Every day | SUBCUTANEOUS | Status: DC

## 2015-02-28 MED ORDER — INSULIN GLARGINE 100 UNIT/ML ~~LOC~~ SOLN
40.0000 [IU] | Freq: Every day | SUBCUTANEOUS | Status: DC
  Administered 2015-02-28: 40 [IU] via SUBCUTANEOUS
  Filled 2015-02-28 (×2): qty 0.4

## 2015-02-28 MED ORDER — TRIAMTERENE-HCTZ 37.5-25 MG PO TABS
1.0000 | ORAL_TABLET | Freq: Two times a day (BID) | ORAL | Status: DC
  Administered 2015-02-28: 1 via ORAL
  Filled 2015-02-28 (×3): qty 1

## 2015-02-28 NOTE — Consult Note (Signed)
Name: Joann [name] MRN: [id number] DOB: 12/09/1960    ADMISSION DATE:  02/27/2015 CONSULTATION DATE:  02/28/15  REFERRING MD :  Dr. Tana Coast   CHIEF COMPLAINT:  SOB   BRIEF PATIENT DESCRIPTION: 53 y/o F admitted 4/20 with a 3 week hx of SOB.  CT concerning for pulmonary fibrosis / UIP.  PCCM consulted for evaluation.    SIGNIFICANT EVENTS  4/20  Admit with SOB  STUDIES:  4/21  CTA Chest >> neg for PE, pulmonary fibrosis with UIP pattern, mediastinal / hilar LAN, hepatic steatosis   HISTORY OF PRESENT ILLNESS:  54 y/o F with PMH of DM, headaches, fibromyalgia, GERD, IBS, interstitial cystitis, hypothyroidism, depression, sleep apnea, partial nephrectomy for renal cancer in 2015, bladder tack, cervical laminectomy, hysterectomy, incisional hernia s/p mesh who presented to [facility] with complaints of SOB.    Upon questioning, she reports intermittent shortness of breath that dates back 15 years.  She previously was seen by an asthma and allergy specialist and diagnosed with asthma (no PFTs). She works as a Passenger transport manager at [employer]. Denies other known occupational exposures.  She grew up in [place], had a wood burning stove. She denies known asbestos exposures. Her father had rheumatoid arthritis with rheumatoid lung and was recently deceased. She notes significant worsening of shortness of breath that started in November 2015. At the time that her father was in the hospital and she "pushed through it".  She parks in handicap parking but is dyspneic walking the distance from the door to the OR and has to take rest breaks. She is unable to climb one flight of stairs without a rest break.  She is able to complete ADLs but also requires rest breaks.  Currently she has 12 dogs that live in her home and goats/chickens outside. No other exotic animals.  She denies chest pain, pain with inspiration.    CT of abd review from 11/2013 shows basilar pulmonary fibrosis.  CXR review shows progression of  scarring over the past year.      PAST MEDICAL HISTORY :   has a past medical history of Asthma; Diabetes mellitus; Headache(784.0); Fibromyalgia; GERD (gastroesophageal reflux disease); IBS (irritable bowel syndrome); Renal mass, right; IC (interstitial cystitis); Frequency of urination; Anemia; Swelling of both lower extremities; Hypothyroidism; Depression; Cancer; Hypertension; Wears glasses; and Sleep apnea.  has past surgical history that includes Cesarean section ([place]); [place]; CYSTOCOPY; Cystoscopy; Robotic assited partial nephrectomy (Right, 12/14/2013); Cystoscopy w/ ureteral stent placement (Right, 12/14/2013); Cervical laminectomy (07/2012); Abdominal hysterectomy (1996); Cholecystectomy (1995); Posterior cervical laminectomy (2004); Tubal ligation; Colonoscopy; Incisional hernia repair (N/A, 09/12/2014); and Insertion of mesh (N/A, 09/12/2014).    Prior to Admission medications   Medication Sig Start Date End Date Taking? Authorizing Provider  albuterol (PROVENTIL HFA;VENTOLIN HFA) 108 (90 BASE) MCG/ACT inhaler Inhale 2 puffs into the lungs every 6 (six) hours as needed. For shortness of breath   Yes Historical Provider, MD  ALPRAZolam Duanne Moron) 0.5 MG tablet Take 0.5 mg by mouth daily as needed for anxiety.    Yes Historical Provider, MD  amLODipine (NORVASC) 5 MG tablet Take 5 mg by mouth daily.   Yes Historical Provider, MD  cholecalciferol (VITAMIN D) 1000 UNITS tablet Take 1,000 Units by mouth daily.   Yes Historical Provider, MD  dicyclomine (BENTYL) 20 MG tablet Take 20 mg by mouth every 6 (six) hours.   Yes Historical Provider, MD  DULoxetine (CYMBALTA) 60 MG capsule Take 60 mg by mouth daily.  Yes Historical Provider, MD  esomeprazole (NEXIUM) 40 MG capsule Take 40 mg by mouth 2 (two) times daily before a meal.    Yes Historical Provider, MD  estrogens, conjugated, (PREMARIN) 0.625 MG tablet Take 0.625 mg by mouth daily.    Yes Historical Provider, MD    Fluticasone-Salmeterol (ADVAIR) 100-50 MCG/DOSE AEPB Inhale 1 puff into the lungs every 12 (twelve) hours.   Yes Historical Provider, MD  indomethacin (INDOCIN) 25 MG capsule Take 25 mg by mouth 2 (two) times daily with a meal.   Yes Historical Provider, MD  insulin aspart (NOVOLOG) 100 UNIT/ML injection Inject 15 Units into the skin daily as needed for high blood sugar (with largestmeal if sugar is over 200).   Yes Historical Provider, MD  insulin glargine (LANTUS) 100 UNIT/ML injection Inject 50 Units into the skin at bedtime.    Yes Historical Provider, MD  levothyroxine (SYNTHROID, LEVOTHROID) 200 MCG tablet Take 200 mcg by mouth daily before breakfast.   Yes Historical Provider, MD  lisinopril (PRINIVIL,ZESTRIL) 20 MG tablet Take 20 mg by mouth every morning.    Yes Historical Provider, MD  meclizine (ANTIVERT) 25 MG tablet Take 25 mg by mouth 3 (three) times daily as needed for dizziness.   Yes Historical Provider, MD  metFORMIN (GLUCOPHAGE-XR) 500 MG 24 hr tablet Take 1,000 mg by mouth 2 (two) times daily.   Yes Historical Provider, MD  ondansetron (ZOFRAN) 4 MG tablet Take 4 mg by mouth every 8 (eight) hours as needed for nausea or vomiting.   Yes Historical Provider, MD  potassium chloride (K-DUR) 10 MEQ tablet Take 10 mEq by mouth 2 (two) times daily.   Yes Historical Provider, MD  topiramate (TOPAMAX) 25 MG tablet Take 25 mg by mouth 2 (two) times daily.   Yes Historical Provider, MD  traZODone (DESYREL) 150 MG tablet Take 75 mg by mouth at bedtime.   Yes Historical Provider, MD  triamterene-hydrochlorothiazide (MAXZIDE-25) 37.5-25 MG per tablet Take 1 tablet by mouth 2 (two) times daily.    Yes Historical Provider, MD  vitamin E 400 UNIT capsule Take 800 Units by mouth daily.   Yes Historical Provider, MD   Allergies  Allergen Reactions  . Peanut-Containing Drug Products Shortness Of Breath    Pecans, walnuts as well  . Sulfa Antibiotics Hives and Shortness Of Breath  . Betadine  [Povidone Iodine] Rash  . Latex Rash    FAMILY HISTORY:  family history includes COPD in her father; Cancer in her mother; Hyperlipidemia in her father; Hypertension in her father; Stroke in her father.  SOCIAL HISTORY:  reports that she has never smoked. She does not have any smokeless tobacco history on file. She reports that she drinks alcohol. She reports that she does not use illicit drugs.  REVIEW OF SYSTEMS:   Constitutional: Negative for fever, chills, weight loss, malaise/fatigue and diaphoresis.  HENT: Negative for hearing loss, ear pain, nosebleeds, congestion, sore throat, neck pain, tinnitus and ear discharge.   Eyes: Negative for blurred vision, double vision, photophobia, pain, discharge and redness.  Respiratory: Negative for cough, hemoptysis, sputum production, wheezing and stridor.  Reports shortness of breath Cardiovascular: Negative for chest pain, palpitations, orthopnea, claudication, and PND. Reports LE swelling on admit (none present on exam) Gastrointestinal: Negative for heartburn, nausea, vomiting, abdominal pain, diarrhea, constipation, blood in stool and melena.  Genitourinary: Negative for dysuria, urgency, frequency, hematuria and flank pain.  Musculoskeletal: Negative for myalgias, back pain, joint pain and falls.  Skin: Negative for  itching and rash.  Neurological: Negative for dizziness, tingling, tremors, sensory change, speech change, focal weakness, seizures, loss of consciousness, weakness and headaches.  Endo/Heme/Allergies: Negative for environmental allergies and polydipsia. Does not bruise/bleed easily.  SUBJECTIVE:   VITAL SIGNS: Temp:  [98.1 F (36.7 C)-98.3 F (36.8 C)] 98.3 F (36.8 C) (04/21 4327) Pulse Rate:  [88-105] 92 (04/21 0608) Resp:  [17-33] 21 (04/21 0608) BP: (116-155)/(66-86) 135/71 mmHg (04/21 0608) SpO2:  [88 %-100 %] 100 % (04/21 0727) Weight:  [189 lb 13.1 oz (86.1 kg)] 189 lb 13.1 oz (86.1 kg) (04/21 0325)  PHYSICAL  EXAMINATION: General:  wdwn obese female in NAD  Neuro:  AAOx4, speech clear, MAE  HEENT:  MM pink/moist, no jvd Cardiovascular:  s1s2 rrr, no m/r/g Lungs:  resp's even/non-labored, lungs bilaterally with velcro crackles 2/3 way up posterior  Abdomen:  Obese/soft, bsx4 active  Musculoskeletal:  No acute deformities  Skin:  Warm/dry, no edema    Recent Labs Lab 02/27/15 1843  NA 137  K 4.5  CL 105  CO2 21  BUN 15  CREATININE 1.21*  GLUCOSE 183*    Recent Labs Lab 02/27/15 1843  HGB 11.7*  HCT 36.0  WBC 9.6  PLT 329   Dg Chest 2 View  02/27/2015   CLINICAL DATA:  Short of breath. Intermittent tachycardia over the last several weeks.  EXAM: CHEST  2 VIEW  COMPARISON:  12/31/2014.  06/25/2014.  FINDINGS: The heart is mildly enlarged. Mediastinal shadows are normal. There is chronic lung disease with a diffuse fibrotic pattern and and bronchial thickening. Markings in general are more prominent which could indicate progressive fibrosis or superimposed widespread inflammation. No sign of acute consolidation, collapse or effusion.  IMPRESSION: Chronically progressive fibrotic pattern. Increased markings could be associated with widespread bronchitis. No evidence of dense consolidation or lobar collapse. No effusions.   Electronically Signed   By: Nelson Chimes M.D.   On: 02/27/2015 21:22   Ct Angio Chest Pe W/cm &/or Wo Cm  02/28/2015   CLINICAL DATA:  Tachycardia and shortness of breath, worsening over the past 2 weeks.  EXAM: CT ANGIOGRAPHY CHEST WITH CONTRAST  TECHNIQUE: Multidetector CT imaging of the chest was performed using the standard protocol during bolus administration of intravenous contrast. Multiplanar CT image reconstructions and MIPs were obtained to evaluate the vascular anatomy.  CONTRAST:  62mL OMNIPAQUE IOHEXOL 350 MG/ML SOLN  COMPARISON:  None.  FINDINGS: THORACIC INLET/BODY WALL:  No acute abnormality.  MEDIASTINUM:  Normal heart size. No pericardial effusion.  Bilateral hilar and mediastinal lymphadenopathy, likely related to the pulmonary fibrosis. CTA of the pulmonary arteries is limited by intermittent motion but exam is overall diagnostic and negative for pulmonary embolism.  LUNG WINDOWS:  Low lung volumes with coarse interstitial opacities, traction bronchiectasis, and honeycombing at the bases. There is subpleural predominance and no clear craniocaudal gradient. No gross pneumonia is identified. Patchy ground-glass density could reflect active alveolitis (versus atelectasis).  UPPER ABDOMEN:  Hepatic steatosis and probable enlargement.  Cholecystectomy.  OSSEOUS:  No acute fracture.  No suspicious lytic or blastic lesions.  Review of the MIP images confirms the above findings.  IMPRESSION: 1. No evidence of pulmonary embolism. 2. Pulmonary fibrosis with a usual interstitial pneumonia pattern. By x-ray, fibrosis has increased over the past year; patchy ground-glass density on the current study may reflect active alveolitis. 3. Mediastinal and hilar lymphadenopathy, nonspecific but likely reactive to #2. 4. Hepatic steatosis.   Electronically Signed   By: Angelica Chessman  Watts M.D.   On: 02/28/2015 00:32    ASSESSMENT / PLAN:   Pulmonary Fibrosis Dyspnea  Familial Hx of RA   Plan: High resolution CT of chest to further evaluate parenchyma Assess autoimmune panel:  ANA, ESR, RF ordered Oxygen to support sats > 90% Will need ambulatory oxygen assessment prior to discharge  Consider VATS biopsy for tissue assessment vs FOB Medication review, no obvious meds on list currently associated with PF but ? If she has been on abx (specifically macrobid) in past year Hold steroids for now Outpatient follow up with Dr. Chase Caller (arranged)   Noe Gens, NP-C [place] Pgr: [phone] or [phone] 02/28/2015, 8:28 AM

## 2015-02-28 NOTE — Progress Notes (Signed)
Patient ambulated approx 200 ft on 2L 02 with sats of 90%. Patient did experience "a little bit" of SOB and sat on a chair for about 2 mins. While resting, patient's 02 sats increased to 93% and maintained until she reached her room (5w02)

## 2015-02-28 NOTE — Progress Notes (Signed)
[facility]                                                                              Patient Demographics  Joann [name], is a 54 y.o. female, DOB - 01-09-1961, TIR:[id number]  Admit date - 02/27/2015   Admitting Physician Etta Quill, DO  Outpatient Primary MD for the patient is Velna Hatchet, MD  LOS - 0   Chief Complaint  Patient presents with  . Tachycardia  . Shortness of Breath       Brief HPI   Patient is a 54 year old female with diabetes, hypertension, asthma presented to ED with shortness of breath over the past 3 weeks. Patient may have had the symptoms much longer in duration but she noticed it worsened to 3 weeks. Patient reported that it had gotten so severe that she was out of breath just walking to her car a day before the admission. She was admitted for further workup.   Assessment & Plan    Principal Problem:   Acute on chronic respiratory failure with history of chronic dyspnea on exertion, evidence of chronic interstitial lung disease - Improving, currently O2 sats 96% on 2 L - Pulmonology consulted, recommended high resolution CT chest to further evaluate parenchyma, 2-D echocardiogram - Discussed in detail with Dr. Chase Caller, ordered autoimmune panel, will follow - Home O2 evaluation prior to discharge - No steroids for now until autoimmune panel results are back - Per Dr. Chase Caller, if patient is improving, okay to DC home tomorrow and follow up outpatient with him and further management outpatient.  Active Problems:   DM2 (diabetes mellitus, type 2) - Continue sliding scale insulin, follow hemoglobin A1c - Will increase Lantus, and bedtime correction scale    HTN (hypertension) - Currently stable    ILD (interstitial lung disease) - As #1  Code Status: full code  Family Communication: Discussed in detail with the patient, all imaging results, lab results explained to the patient and husband at bedside   Disposition  Plan:  Pending lab results, and 3 echo, CT chest, hopefully tomorrow  Time Spent in minutes  25 minutes  Procedures  CT chest  Consults   Pulmonology  DVT Prophylaxis   heparin  Medications  Scheduled Meds: . amLODipine  5 mg Oral Daily  . antiseptic oral rinse  7 mL Mouth Rinse BID  . cholecalciferol  1,000 Units Oral Daily  . dicyclomine  20 mg Oral TID AC & HS  . DULoxetine  60 mg Oral Daily  . estrogens (conjugated)  0.625 mg Oral Daily  . heparin  5,000 Units Subcutaneous 3 times per day  . indomethacin  25 mg Oral BID WC  . insulin aspart  0-20 Units Subcutaneous TID WC  . insulin glargine  40 Units Subcutaneous QHS  . ipratropium-albuterol  3 mL Nebulization TID  . levothyroxine  200 mcg Oral QAC breakfast  . lisinopril  20 mg Oral q morning - 10a  . metFORMIN  1,000 mg Oral BID WC  . mometasone-formoterol  2 puff Inhalation BID  . pantoprazole  80 mg Oral Q1200  . potassium chloride  10 mEq  Oral BID  . topiramate  25 mg Oral BID  . traZODone  75 mg Oral QHS  . triamterene-hydrochlorothiazide  1 tablet Oral BID   Continuous Infusions:  PRN Meds:.albuterol, ALPRAZolam, meclizine, ondansetron   Antibiotics   Anti-infectives    None        Subjective:   Joann [name] was seen and examined today. Patient denies dizziness, chest pain, abdominal pain, N/V/D/C, new weakness, numbess, tingling. No acute events overnight. Feels somewhat better today after starting oxygen, has chronic shortness of breath worsened the last 3 weeks.   Objective:   Blood pressure 147/77, pulse 112, temperature 98.3 F (36.8 C), temperature source Oral, resp. rate 21, height 4\' 11"  (1.499 m), weight 86.1 kg (189 lb 13.1 oz), SpO2 96 %.  Wt Readings from Last 3 Encounters:  02/28/15 86.1 kg (189 lb 13.1 oz)  09/12/14 84.369 kg (186 lb)  12/14/13 84.369 kg (186 lb)     Intake/Output Summary (Last 24 hours) at 02/28/15 1256 Last data filed at 02/28/15 0938  Gross per 24 hour    Intake    200 ml  Output      0 ml  Net    200 ml    Exam  General: Alert and oriented x 3, NAD  HEENT:  PERRLA, EOMI, Anicteic Sclera, mucous membranes moist.   Neck: Supple, no JVD, no masses  CVS: S1 S2 auscultated, no rubs, murmurs or gallops. Regular rate and rhythm.  Respiratory: Bibasilar crackles  Abdomen: Morbidly obese, Soft, nontender, nondistended, + bowel sounds  Ext: no cyanosis clubbing or edema  Neuro: AAOx3, Cr N's II- XII. Strength 5/5 upper and lower extremities bilaterally  Skin: No rashes  Psych: Normal affect and demeanor, alert and oriented x3    Data Review   Micro Results No results found for this or any previous visit (from the past 240 hour(s)).  Radiology Reports Dg Chest 2 View  02/27/2015   CLINICAL DATA:  Short of breath. Intermittent tachycardia over the last several weeks.  EXAM: CHEST  2 VIEW  COMPARISON:  12/31/2014.  06/25/2014.  FINDINGS: The heart is mildly enlarged. Mediastinal shadows are normal. There is chronic lung disease with a diffuse fibrotic pattern and and bronchial thickening. Markings in general are more prominent which could indicate progressive fibrosis or superimposed widespread inflammation. No sign of acute consolidation, collapse or effusion.  IMPRESSION: Chronically progressive fibrotic pattern. Increased markings could be associated with widespread bronchitis. No evidence of dense consolidation or lobar collapse. No effusions.   Electronically Signed   By: Nelson Chimes M.D.   On: 02/27/2015 21:22   Ct Angio Chest Pe W/cm &/or Wo Cm  02/28/2015   CLINICAL DATA:  Tachycardia and shortness of breath, worsening over the past 2 weeks.  EXAM: CT ANGIOGRAPHY CHEST WITH CONTRAST  TECHNIQUE: Multidetector CT imaging of the chest was performed using the standard protocol during bolus administration of intravenous contrast. Multiplanar CT image reconstructions and MIPs were obtained to evaluate the vascular anatomy.  CONTRAST:   79mL OMNIPAQUE IOHEXOL 350 MG/ML SOLN  COMPARISON:  None.  FINDINGS: THORACIC INLET/BODY WALL:  No acute abnormality.  MEDIASTINUM:  Normal heart size. No pericardial effusion. Bilateral hilar and mediastinal lymphadenopathy, likely related to the pulmonary fibrosis. CTA of the pulmonary arteries is limited by intermittent motion but exam is overall diagnostic and negative for pulmonary embolism.  LUNG WINDOWS:  Low lung volumes with coarse interstitial opacities, traction bronchiectasis, and honeycombing at the bases. There is subpleural predominance  and no clear craniocaudal gradient. No gross pneumonia is identified. Patchy ground-glass density could reflect active alveolitis (versus atelectasis).  UPPER ABDOMEN:  Hepatic steatosis and probable enlargement.  Cholecystectomy.  OSSEOUS:  No acute fracture.  No suspicious lytic or blastic lesions.  Review of the MIP images confirms the above findings.  IMPRESSION: 1. No evidence of pulmonary embolism. 2. Pulmonary fibrosis with a usual interstitial pneumonia pattern. By x-ray, fibrosis has increased over the past year; patchy ground-glass density on the current study may reflect active alveolitis. 3. Mediastinal and hilar lymphadenopathy, nonspecific but likely reactive to #2. 4. Hepatic steatosis.   Electronically Signed   By: Monte Fantasia M.D.   On: 02/28/2015 00:32    CBC  Recent Labs Lab 02/27/15 1843  WBC 9.6  HGB 11.7*  HCT 36.0  PLT 329  MCV 86.5  MCH 28.1  MCHC 32.5  RDW 14.8    Chemistries   Recent Labs Lab 02/27/15 1843  NA 137  K 4.5  CL 105  CO2 21  GLUCOSE 183*  BUN 15  CREATININE 1.21*  CALCIUM 9.2   ------------------------------------------------------------------------------------------------------------------ estimated creatinine clearance is 50.7 mL/min (by C-G formula based on Cr of 1.21). ------------------------------------------------------------------------------------------------------------------ No  results for input(s): HGBA1C in the last 72 hours. ------------------------------------------------------------------------------------------------------------------ No results for input(s): CHOL, HDL, LDLCALC, TRIG, CHOLHDL, LDLDIRECT in the last 72 hours. ------------------------------------------------------------------------------------------------------------------ No results for input(s): TSH, T4TOTAL, T3FREE, THYROIDAB in the last 72 hours.  Invalid input(s): FREET3 ------------------------------------------------------------------------------------------------------------------ No results for input(s): VITAMINB12, FOLATE, FERRITIN, TIBC, IRON, RETICCTPCT in the last 72 hours.  Coagulation profile No results for input(s): INR, PROTIME in the last 168 hours.   Recent Labs  02/27/15 2200  DDIMER 0.96*    Cardiac Enzymes No results for input(s): CKMB, TROPONINI, MYOGLOBIN in the last 168 hours.  Invalid input(s): CK ------------------------------------------------------------------------------------------------------------------ Invalid input(s): [place]  02/28/15 0353 02/28/15 0756 02/28/15 1200  GLUCAP 198* 228* 41*     RAI,RIPUDEEP M.D. [facility] 02/28/2015, 12:56 PM  Pager: [phone]   Between 7am to 7pm - call Pager - [phone]  After 7pm go to [email or web address] - password [email or web address]  Call night coverage person covering after 7pm

## 2015-02-28 NOTE — Progress Notes (Signed)
  Pt admitted to the unit. Pt is stable, alert and oriented per baseline. Oriented to room, staff, and call bell. Educated to call for any assistance. Bed in lowest position, call bell within reach- will continue to monitor. 

## 2015-02-28 NOTE — H&P (Signed)
[facility] History and Physical  Joann [name] ZLD:[id number] DOB: 1961/05/03 DOA: 02/27/2015  Referring physician: EDP PCP: Velna Hatchet, MD   Chief Complaint: SOB   HPI: Joann [name] is a 54 y.o. female with h/o DM2, HTN, renal cell cancer s/p resection, "asthma". patient presents to the ED with insidious onset of SOB over at least the past 3 weeks.  Possibly much longer in duration but she really noticed it these past 3 weeks.  It has gotten so severe that she is out of breath just walking to her car yesterday.  She presents to the ED for these symptoms.  Review of Systems: Systems reviewed.  As above, otherwise negative  Past Medical History  Diagnosis Date  . Asthma   . Diabetes mellitus   . Headache(784.0)     MIGRAINES  . Fibromyalgia   . GERD (gastroesophageal reflux disease)   . IBS (irritable bowel syndrome)   . Renal mass, right   . IC (interstitial cystitis)   . Frequency of urination   . Anemia   . Swelling of both lower extremities     TAKES MAXIDE FOR SWELLING (DENIES HBP)  . Hypothyroidism   . Depression   . Cancer     kidney  . Hypertension   . Wears glasses   . Sleep apnea     uses a cpap   Past Surgical History  Procedure Laterality Date  . Cesarean section  1983, 1985     x 2  . Bladder tack    . Cystocopy    . Cystoscopy    . Robotic assited partial nephrectomy Right 12/14/2013    Procedure: ROBOTIC ASSITED PARTIAL NEPHRECTOMY;  Surgeon: Dutch Gray, MD;  Location: [facility];  Service: Urology;  Laterality: Right;  . Cystoscopy w/ ureteral stent placement Right 12/14/2013    Procedure: CYSTOSCOPY WITH RETROGRADE PYELOGRAM/URETERAL STENT PLACEMENT;  Surgeon: Dutch Gray, MD;  Location: [facility];  Service: Urology;  Laterality: Right;  . Cervical laminectomy  07/2012    anterior with plates and screws.  . Abdominal hysterectomy  1996  . Cholecystectomy  1995    tubal ligation also  . Posterior cervical laminectomy  2004  . Tubal ligation    .  Colonoscopy    . Incisional hernia repair N/A 09/12/2014    Procedure: HERNIA REPAIR INCISIONAL;  Surgeon: Coralie Keens, MD;  Location: [place];  Service: General;  Laterality: N/A;  . Insertion of mesh N/A 09/12/2014    Procedure: INSERTION OF MESH;  Surgeon: Coralie Keens, MD;  Location: [place];  Service: General;  Laterality: N/A;   Social History:  reports that she has never smoked. She does not have any smokeless tobacco history on file. She reports that she drinks alcohol. She reports that she does not use illicit drugs.  Allergies  Allergen Reactions  . Sulfa Antibiotics Hives and Shortness Of Breath  . Betadine [Povidone Iodine] Rash  . Latex Rash    Family History  Problem Relation Age of Onset  . Cancer Mother     breast, lung  . COPD Father   . Hypertension Father   . Hyperlipidemia Father   . Stroke Father      Prior to Admission medications   Medication Sig Start Date End Date Taking? Authorizing Provider  albuterol (PROVENTIL HFA;VENTOLIN HFA) 108 (90 BASE) MCG/ACT inhaler Inhale 2 puffs into the lungs every 6 (six) hours as needed. For shortness of breath   Yes Historical  Provider, MD  ALPRAZolam Duanne Moron) 0.5 MG tablet Take 0.5 mg by mouth daily as needed for anxiety.    Yes Historical Provider, MD  amLODipine (NORVASC) 5 MG tablet Take 5 mg by mouth daily.   Yes Historical Provider, MD  cholecalciferol (VITAMIN D) 1000 UNITS tablet Take 1,000 Units by mouth daily.   Yes Historical Provider, MD  dicyclomine (BENTYL) 20 MG tablet Take 20 mg by mouth every 6 (six) hours.   Yes Historical Provider, MD  DULoxetine (CYMBALTA) 60 MG capsule Take 60 mg by mouth daily.   Yes Historical Provider, MD  esomeprazole (NEXIUM) 40 MG capsule Take 40 mg by mouth 2 (two) times daily before a meal.    Yes Historical Provider, MD  estrogens, conjugated, (PREMARIN) 0.625 MG tablet Take 0.625 mg by mouth daily.    Yes Historical Provider, MD   Fluticasone-Salmeterol (ADVAIR) 100-50 MCG/DOSE AEPB Inhale 1 puff into the lungs every 12 (twelve) hours.   Yes Historical Provider, MD  indomethacin (INDOCIN) 25 MG capsule Take 25 mg by mouth 2 (two) times daily with a meal.   Yes Historical Provider, MD  insulin aspart (NOVOLOG) 100 UNIT/ML injection Inject 15 Units into the skin daily as needed for high blood sugar (with largestmeal if sugar is over 200).   Yes Historical Provider, MD  insulin glargine (LANTUS) 100 UNIT/ML injection Inject 50 Units into the skin at bedtime.    Yes Historical Provider, MD  levothyroxine (SYNTHROID, LEVOTHROID) 200 MCG tablet Take 200 mcg by mouth daily before breakfast.   Yes Historical Provider, MD  lisinopril (PRINIVIL,ZESTRIL) 20 MG tablet Take 20 mg by mouth every morning.    Yes Historical Provider, MD  meclizine (ANTIVERT) 25 MG tablet Take 25 mg by mouth 3 (three) times daily as needed for dizziness.   Yes Historical Provider, MD  metFORMIN (GLUCOPHAGE-XR) 500 MG 24 hr tablet Take 1,000 mg by mouth 2 (two) times daily.   Yes Historical Provider, MD  ondansetron (ZOFRAN) 4 MG tablet Take 4 mg by mouth every 8 (eight) hours as needed for nausea or vomiting.   Yes Historical Provider, MD  potassium chloride (K-DUR) 10 MEQ tablet Take 10 mEq by mouth 2 (two) times daily.   Yes Historical Provider, MD  topiramate (TOPAMAX) 25 MG tablet Take 25 mg by mouth 2 (two) times daily.   Yes Historical Provider, MD  traZODone (DESYREL) 150 MG tablet Take 75 mg by mouth at bedtime.   Yes Historical Provider, MD  triamterene-hydrochlorothiazide (MAXZIDE-25) 37.5-25 MG per tablet Take 1 tablet by mouth 2 (two) times daily.    Yes Historical Provider, MD  vitamin E 400 UNIT capsule Take 800 Units by mouth daily.   Yes Historical Provider, MD  benzonatate (TESSALON) 100 MG capsule Take 1 capsule (100 mg total) by mouth 3 (three) times daily as needed for cough. 05/08/14   Audelia Hives Presson, PA  buPROPion (WELLBUTRIN)  100 MG tablet Take 100 mg by mouth 2 (two) times daily.    Historical Provider, MD  levothyroxine (SYNTHROID, LEVOTHROID) 175 MCG tablet Take 175 mcg by mouth daily before breakfast.     Historical Provider, MD  pravastatin (PRAVACHOL) 40 MG tablet Take 40 mg by mouth every Monday, Wednesday, and Friday.     Historical Provider, MD  solifenacin (VESICARE) 10 MG tablet Take 10 mg by mouth daily.    Historical Provider, MD   Physical Exam: Filed Vitals:   02/28/15 0130  BP: 123/66  Pulse: 100  Temp:  Resp: 31    BP 123/66 mmHg  Pulse 100  Temp(Src) 98.2 F (36.8 C)  Resp 31  SpO2 98%  General Appearance:    Alert, oriented, no distress, appears stated age  Head:    Normocephalic, atraumatic  Eyes:    PERRL, EOMI, sclera non-icteric        Nose:   Nares without drainage or epistaxis. Mucosa, turbinates normal  Throat:   Moist mucous membranes. Oropharynx without erythema or exudate.  Neck:   Supple. No carotid bruits.  No thyromegaly.  No lymphadenopathy.   Back:     No CVA tenderness, no spinal tenderness  Lungs:     Patient with Inspiratory crackles in all lung fields, diminished TLC  Chest wall:    No tenderness to palpitation  Heart:    Regular rate and rhythm without murmurs, gallops, rubs  Abdomen:     Soft, non-tender, nondistended, normal bowel sounds, no organomegaly  Genitalia:    deferred  Rectal:    deferred  Extremities:   No clubbing, cyanosis or edema.  Pulses:   2+ and symmetric all extremities  Skin:   Skin color, texture, turgor normal, no rashes or lesions  Lymph nodes:   Cervical, supraclavicular, and axillary nodes normal  Neurologic:   CNII-XII intact. Normal strength, sensation and reflexes      throughout    Labs on Admission:  Basic Metabolic Panel:  Recent Labs Lab 02/27/15 1843  NA 137  K 4.5  CL 105  CO2 21  GLUCOSE 183*  BUN 15  CREATININE 1.21*  CALCIUM 9.2   Liver Function Tests: No results for input(s): AST, ALT, ALKPHOS,  BILITOT, PROT, ALBUMIN in the last 168 hours. No results for input(s): LIPASE, AMYLASE in the last 168 hours. No results for input(s): AMMONIA in the last 168 hours. CBC:  Recent Labs Lab 02/27/15 1843  WBC 9.6  HGB 11.7*  HCT 36.0  MCV 86.5  PLT 329   Cardiac Enzymes: No results for input(s): CKTOTAL, CKMB, CKMBINDEX, TROPONINI in the last 168 hours.  BNP (last 3 results) No results for input(s): PROBNP in the last 8760 hours. CBG: No results for input(s): GLUCAP in the last 168 hours.  Radiological Exams on Admission: Dg Chest 2 View  02/27/2015   CLINICAL DATA:  Short of breath. Intermittent tachycardia over the last several weeks.  EXAM: CHEST  2 VIEW  COMPARISON:  12/31/2014.  06/25/2014.  FINDINGS: The heart is mildly enlarged. Mediastinal shadows are normal. There is chronic lung disease with a diffuse fibrotic pattern and and bronchial thickening. Markings in general are more prominent which could indicate progressive fibrosis or superimposed widespread inflammation. No sign of acute consolidation, collapse or effusion.  IMPRESSION: Chronically progressive fibrotic pattern. Increased markings could be associated with widespread bronchitis. No evidence of dense consolidation or lobar collapse. No effusions.   Electronically Signed   By: Nelson Chimes M.D.   On: 02/27/2015 21:22   Ct Angio Chest Pe W/cm &/or Wo Cm  02/28/2015   CLINICAL DATA:  Tachycardia and shortness of breath, worsening over the past 2 weeks.  EXAM: CT ANGIOGRAPHY CHEST WITH CONTRAST  TECHNIQUE: Multidetector CT imaging of the chest was performed using the standard protocol during bolus administration of intravenous contrast. Multiplanar CT image reconstructions and MIPs were obtained to evaluate the vascular anatomy.  CONTRAST:  98mL OMNIPAQUE IOHEXOL 350 MG/ML SOLN  COMPARISON:  None.  FINDINGS: THORACIC INLET/BODY WALL:  No acute abnormality.  MEDIASTINUM:  Normal  heart size. No pericardial effusion. Bilateral  hilar and mediastinal lymphadenopathy, likely related to the pulmonary fibrosis. CTA of the pulmonary arteries is limited by intermittent motion but exam is overall diagnostic and negative for pulmonary embolism.  LUNG WINDOWS:  Low lung volumes with coarse interstitial opacities, traction bronchiectasis, and honeycombing at the bases. There is subpleural predominance and no clear craniocaudal gradient. No gross pneumonia is identified. Patchy ground-glass density could reflect active alveolitis (versus atelectasis).  UPPER ABDOMEN:  Hepatic steatosis and probable enlargement.  Cholecystectomy.  OSSEOUS:  No acute fracture.  No suspicious lytic or blastic lesions.  Review of the MIP images confirms the above findings.  IMPRESSION: 1. No evidence of pulmonary embolism. 2. Pulmonary fibrosis with a usual interstitial pneumonia pattern. By x-ray, fibrosis has increased over the past year; patchy ground-glass density on the current study may reflect active alveolitis. 3. Mediastinal and hilar lymphadenopathy, nonspecific but likely reactive to #2. 4. Hepatic steatosis.   Electronically Signed   By: Monte Fantasia M.D.   On: 02/28/2015 00:32    EKG: Independently reviewed.  Assessment/Plan Active Problems:   IPF (idiopathic pulmonary fibrosis)   1. Pulmonary fibrosis - DDX is broad but most suspicious for IPF.  The radiologist is calling UIP on the CT scan, Dr. Leonidas Romberg however says that hes not really sure you can call UIP on a non-high res CT scan though.  Still overall presentation, CT scan, is most suspicious for IPF he agrees and recommends: 1. O2 therapy (will monitor with continuous pulse ox) 2. No steroids 3. No ABx 4. Checking various connective tissue labs 5. Hold of on other orders for now, but likely patient will end up needing high-res CT chest to confirm UIP and ultimately lung biopsy is likely needed. 2. DM2 - 1. Lantus 40 units daily (down from 50 at home) 2. High dose SSI 3. Carb mod  diet 3. HTN - continue home meds  Spoke with Dr. Leonidas Romberg and pulm is going to consult on patient tomorrow.  Code Status: Full Code  Family Communication: Family at bedside Disposition Plan: Admit to inpatient   Time spent: 70 min  Earlin Sweeden M. [facility] Pager [phone]  If 7AM-7PM, please contact the day team taking care of the patient [email or web address] Password [email or web address] 02/28/2015, 2:18 AM

## 2015-02-28 NOTE — Progress Notes (Signed)
Inpatient Diabetes Program Recommendations  AACE/ADA: New Consensus Statement on Inpatient Glycemic Control (2013)  Target Ranges:  Prepandial:   less than 140 mg/dL      Peak postprandial:   less than 180 mg/dL (1-2 hours)      Critically ill patients:  140 - 180 mg/dL   Results for FIZZA, SCALES (MRN [id number]) as of 02/28/2015 09:00  Ref. Range 02/28/2015 03:53 02/28/2015 07:56  Glucose-Capillary Latest Ref Range: 70-99 mg/dL 198 (H) 228 (H)   Diabetes history: DM2 Outpatient Diabetes medications: Lantus 50 units QHS, Novolog 15 units with largest meal, Metformin XR 1000 mg BID Current orders for Inpatient glycemic control:Lantus 40 units QHS, Novolog 0-20 units TID with meals, Metformin 1000 mg BID  Inpatient Diabetes Program Recommendations Insulin - Basal: Please consider increasing Lantus to 43 units QHS. Correction (SSI): Please consider ordering Novolog bedtime correction scale. A1C: Please consider ordering an A1C to evaluate glycemic control over the past 2-3 months.  Thanks, Barnie Alderman, RN, MSN, CCRN, CDE Diabetes Coordinator Inpatient Diabetes Program [phone] (Team Pager from [place] to [place]) [phone] ([facility] office) [phone] [facility] office)

## 2015-02-28 NOTE — Progress Notes (Signed)
Utilization review completed.  

## 2015-03-01 ENCOUNTER — Telehealth: Payer: Self-pay | Admitting: Internal Medicine

## 2015-03-01 DIAGNOSIS — J9600 Acute respiratory failure, unspecified whether with hypoxia or hypercapnia: Secondary | ICD-10-CM

## 2015-03-01 LAB — CBC
HEMATOCRIT: 34 % — AB (ref 36.0–46.0)
Hemoglobin: 10.9 g/dL — ABNORMAL LOW (ref 12.0–15.0)
MCH: 28.2 pg (ref 26.0–34.0)
MCHC: 32.1 g/dL (ref 30.0–36.0)
MCV: 88.1 fL (ref 78.0–100.0)
Platelets: 310 10*3/uL (ref 150–400)
RBC: 3.86 MIL/uL — ABNORMAL LOW (ref 3.87–5.11)
RDW: 15.3 % (ref 11.5–15.5)
WBC: 9.6 10*3/uL (ref 4.0–10.5)

## 2015-03-01 LAB — ANTI-DNA ANTIBODY, DOUBLE-STRANDED

## 2015-03-01 LAB — GLUCOSE, CAPILLARY
Glucose-Capillary: 127 mg/dL — ABNORMAL HIGH (ref 70–99)
Glucose-Capillary: 180 mg/dL — ABNORMAL HIGH (ref 70–99)
Glucose-Capillary: 240 mg/dL — ABNORMAL HIGH (ref 70–99)

## 2015-03-01 LAB — ANTI-SCLERODERMA ANTIBODY: Scleroderma (Scl-70) (ENA) Antibody, IgG: <1

## 2015-03-01 LAB — BASIC METABOLIC PANEL
ANION GAP: 11 (ref 5–15)
BUN: 20 mg/dL (ref 6–23)
CALCIUM: 9.2 mg/dL (ref 8.4–10.5)
CO2: 24 mmol/L (ref 19–32)
Chloride: 102 mmol/L (ref 96–112)
Creatinine, Ser: 1.24 mg/dL — ABNORMAL HIGH (ref 0.50–1.10)
GFR, EST AFRICAN AMERICAN: 56 mL/min — AB (ref >90)
GFR, EST NON AFRICAN AMERICAN: 48 mL/min — AB (ref >90)
GLUCOSE: 133 mg/dL — AB (ref 70–99)
POTASSIUM: 4.3 mmol/L (ref 3.5–5.1)
SODIUM: 137 mmol/L (ref 135–145)

## 2015-03-01 LAB — ALDOLASE
ALDOLASE: 7.7 U/L (ref 3.3–10.3)
Aldolase: 6.6 U/L (ref 3.3–10.3)

## 2015-03-01 LAB — SJOGRENS SYNDROME-A EXTRACTABLE NUCLEAR ANTIBODY: SSA (RO) (ENA) ANTIBODY, IGG: NEGATIVE

## 2015-03-01 LAB — MPO/PR-3 (ANCA) ANTIBODIES
ANCA Proteinase 3: <3.5 U/mL (ref 0.0–3.5)
Myeloperoxidase Abs: <9 U/mL (ref 0.0–9.0)

## 2015-03-01 LAB — HEMOGLOBIN A1C
Hgb A1c MFr Bld: 8.6 % — ABNORMAL HIGH (ref 4.8–5.6)
MEAN PLASMA GLUCOSE: 200 mg/dL

## 2015-03-01 LAB — RHEUMATOID FACTOR
RHEUMATOID FACTOR: 63.6 [IU]/mL — AB (ref 0.0–13.9)
RHEUMATOID FACTOR: 61.6 [IU]/mL — AB (ref 0.0–13.9)

## 2015-03-01 LAB — SJOGRENS SYNDROME-B EXTRACTABLE NUCLEAR ANTIBODY: SSB (LA) (ENA) ANTIBODY, IGG: NEGATIVE

## 2015-03-01 LAB — CYCLIC CITRUL PEPTIDE ANTIBODY, IGG/IGA: CCP Antibodies IgG/IgA: 5 units (ref 0–19)

## 2015-03-01 NOTE — Telephone Encounter (Signed)
Joann [name]  Can ou give her fu in 1 week please - ild, hospi fu Needs accp questinaire and spiro at fu

## 2015-03-01 NOTE — Progress Notes (Signed)
  Echocardiogram 2D Echocardiogram has been performed.  Joann [name] 03/01/2015, 11:31 AM

## 2015-03-01 NOTE — Progress Notes (Signed)
Name: Joann [name] MRN: [id number] DOB: [birth year]    ADMISSION DATE:  02/27/2015 CONSULTATION DATE:  02/28/15  REFERRING MD :  Dr. Tana Coast   CHIEF COMPLAINT:  SOB   BRIEF PATIENT DESCRIPTION: 54 y/o F admitted 4/20 with a 3 week hx of SOB.  CT concerning for pulmonary fibrosis / UIP.  PCCM consulted for evaluation.    SIGNIFICANT EVENTS  4/20  Admit with SOB  STUDIES:  4/21  CTA Chest >> neg for PE, pulmonary fibrosis with UIP pattern, mediastinal / hilar LAN, hepatic steatosis 4/21 HRCT>> see report, ILD, NSIP,  HISTORY OF PRESENT ILLNESS:  54 y/o F with PMH of DM, headaches, fibromyalgia, GERD, IBS, interstitial cystitis, hypothyroidism, depression, sleep apnea, partial nephrectomy for renal cancer in 2015, bladder tack, cervical laminectomy, hysterectomy, incisional hernia s/p mesh who presented to [facility] with complaints of SOB.    Upon questioning, she reports intermittent shortness of breath that dates back 15 years.  She previously was seen by an asthma and allergy specialist and diagnosed with asthma (no PFTs). She works as a Passenger transport manager at [employer]. Denies other known occupational exposures.  She grew up in [place], had a wood burning stove. She denies known asbestos exposures. Her father had rheumatoid arthritis with rheumatoid lung and was recently deceased. She notes significant worsening of shortness of breath that started in November 2015. At the time that her father was in the hospital and she "pushed through it".  She parks in handicap parking but is dyspneic walking the distance from the door to the OR and has to take rest breaks. She is unable to climb one flight of stairs without a rest break.  She is able to complete ADLs but also requires rest breaks.  Currently she has 12 dogs that live in her home and goats/chickens outside. No other exotic animals.  She denies chest pain, pain with inspiration.    CT of abd review from 11/2013 shows basilar pulmonary fibrosis.   CXR review shows progression of scarring over the past year.       SUBJECTIVE:   VITAL SIGNS: Temp:  [98 F (36.7 C)-98.6 F (37 C)] 98 F (36.7 C) (04/22 0538) Pulse Rate:  [79-112] 79 (04/22 0538) Resp:  [18-22] 20 (04/22 0538) BP: (114-147)/(67-82) 114/67 mmHg (04/22 0538) SpO2:  [92 %-98 %] 94 % (04/22 0845)  PHYSICAL EXAMINATION: General:  wdwn obese female in NAD on O2 Neuro:  AAOx4, speech clear, MAE  HEENT:  MM pink/moist, no jvd Cardiovascular:  s1s2 rrr, no m/r/g Lungs:  resp's even/non-labored, lungs bilaterally with velcro crackles 2/3 way up posterior  Abdomen:  Obese/soft, bsx4 active  Musculoskeletal:  No acute deformities  Skin:  Warm/dry, no edema    Recent Labs Lab 02/27/15 1843 02/28/15 1152  NA 137 134*  K 4.5 5.1  CL 105 102  CO2 21 19  BUN 15 19  CREATININE 1.21* 1.32*  GLUCOSE 183* 238*    Recent Labs Lab 02/27/15 1843 03/01/15 0720  HGB 11.7* 10.9*  HCT 36.0 34.0*  WBC 9.6 9.6  PLT 329 310   Dg Chest 2 View  02/27/2015   CLINICAL DATA:  Short of breath. Intermittent tachycardia over the last several weeks.  EXAM: CHEST  2 VIEW  COMPARISON:  12/31/2014.  06/25/2014.  FINDINGS: The heart is mildly enlarged. Mediastinal shadows are normal. There is chronic lung disease with a diffuse fibrotic pattern and and bronchial thickening. Markings in general are more prominent  which could indicate progressive fibrosis or superimposed widespread inflammation. No sign of acute consolidation, collapse or effusion.  IMPRESSION: Chronically progressive fibrotic pattern. Increased markings could be associated with widespread bronchitis. No evidence of dense consolidation or lobar collapse. No effusions.   Electronically Signed   By: Nelson Chimes M.D.   On: 02/27/2015 21:22   Ct Angio Chest Pe W/cm &/or Wo Cm  02/28/2015   CLINICAL DATA:  Tachycardia and shortness of breath, worsening over the past 2 weeks.  EXAM: CT ANGIOGRAPHY CHEST WITH CONTRAST   TECHNIQUE: Multidetector CT imaging of the chest was performed using the standard protocol during bolus administration of intravenous contrast. Multiplanar CT image reconstructions and MIPs were obtained to evaluate the vascular anatomy.  CONTRAST:  93mL OMNIPAQUE IOHEXOL 350 MG/ML SOLN  COMPARISON:  None.  FINDINGS: THORACIC INLET/BODY WALL:  No acute abnormality.  MEDIASTINUM:  Normal heart size. No pericardial effusion. Bilateral hilar and mediastinal lymphadenopathy, likely related to the pulmonary fibrosis. CTA of the pulmonary arteries is limited by intermittent motion but exam is overall diagnostic and negative for pulmonary embolism.  LUNG WINDOWS:  Low lung volumes with coarse interstitial opacities, traction bronchiectasis, and honeycombing at the bases. There is subpleural predominance and no clear craniocaudal gradient. No gross pneumonia is identified. Patchy ground-glass density could reflect active alveolitis (versus atelectasis).  UPPER ABDOMEN:  Hepatic steatosis and probable enlargement.  Cholecystectomy.  OSSEOUS:  No acute fracture.  No suspicious lytic or blastic lesions.  Review of the MIP images confirms the above findings.  IMPRESSION: 1. No evidence of pulmonary embolism. 2. Pulmonary fibrosis with a usual interstitial pneumonia pattern. By x-ray, fibrosis has increased over the past year; patchy ground-glass density on the current study may reflect active alveolitis. 3. Mediastinal and hilar lymphadenopathy, nonspecific but likely reactive to #2. 4. Hepatic steatosis.   Electronically Signed   By: Monte Fantasia M.D.   On: 02/28/2015 00:32   Ct Chest High Resolution  02/28/2015   CLINICAL DATA:  54 year old female with shortness of breath and chest pain since yesterday evening. Intermittent tachycardia over the past several weeks, particularly during exertion.  EXAM: CT CHEST WITHOUT CONTRAST  TECHNIQUE: Multidetector CT imaging of the chest was performed following the standard protocol  without intravenous contrast. High resolution imaging of the lungs, as well as inspiratory and expiratory imaging, was performed.  COMPARISON:  Chest CT 02/28/2015.  FINDINGS: Mediastinum/Lymph Nodes: Heart size is size is normal. There is no significant pericardial fluid, thickening or pericardial calcification. Multiple borderline enlarged and mildly enlarged mediastinal and hilar lymph nodes are noted, largest of which measures up to 15 mm in short axis in the subcarinal nodal station. Esophagus is unremarkable in appearance. No axillary lymphadenopathy.  Lungs/Pleura: High-resolution images demonstrate extensive areas of peripheral subpleural and peribronchovascular reticulation, with marked thickening of the peribronchovascular interstitium throughout the lungs bilaterally. This is associated with areas of cylindrical and mild varicose bronchiectasis. Scattered areas of ground-glass attenuation and scattered areas of mild honeycombing are also noted. No definitive craniocaudal gradient. Inspiratory and expiratory imaging is unremarkable. No acute consolidative airspace disease. No pleural effusions.  Upper Abdomen: Diffuse low attenuation throughout the hepatic parenchyma, compatible with hepatic steatosis. Status post cholecystectomy.  Musculoskeletal/Soft Tissues: There are no aggressive appearing lytic or blastic lesions noted in the visualized portions of the skeleton.  IMPRESSION: 1. The appearance of the lungs is compatible with underlying interstitial lung disease, and despite the lack of a clear cut craniocaudal gradient, the overall imaging findings  are most suggestive of usual interstitial pneumonia. Conceivably, these findings could also be seen in setting of severe fibrotic phase nonspecific interstitial pneumonia (NSIP), however, that is not strongly favored. Repeat high-resolution chest CT might be useful in 12 months to assess for temporal changes in the appearance of the lung parenchyma if  clinically appropriate. 2. Mediastinal and bilateral hilar lymphadenopathy noted, likely related to underlying interstitial lung disease. Attention at time of follow-up imaging is recommended. 3. Hepatic steatosis.   Electronically Signed   By: Vinnie Langton M.D.   On: 02/28/2015 14:39    ASSESSMENT / PLAN:   Pulmonary Fibrosis Dyspnea  Familial Hx of RA   Plan: High resolution CT of chest as noted Assess autoimmune panel: sed rate 80, ra 61.6 Oxygen to support sats > 90% Will need ambulatory oxygen assessment prior to discharge  Currently O2 dependent Consider VATS biopsy for tissue assessment vs FOB Medication review, no obvious meds on list currently associated with PF but ? If she has been on abx (specifically macrobid) in past year Hold steroids for now Outpatient follow up with Dr. Chase Caller (arranged)   Noe Gens, NP-C [place] Pgr: [phone] or [phone] 03/01/2015, 9:39 AM

## 2015-03-01 NOTE — Progress Notes (Signed)
SATURATION QUALIFICATIONS: (This note is used to comply with regulatory documentation for home oxygen)  Patient Saturations on Room Air at Rest = 94%  Patient Saturations on Room Air while Ambulating = 88%  Patient Saturations on 1 Liters of oxygen while Ambulating = 92%  Please briefly explain why patient needs home oxygen: desat during ambulation

## 2015-03-01 NOTE — Progress Notes (Signed)
[facility]                                                                              Patient Demographics  Joann [name], is a 54 y.o. female, DOB - 02-Aug-1961, QQP:[id number]  Admit date - 02/27/2015   Admitting Physician Etta Quill, DO  Outpatient Primary MD for the patient is Velna Hatchet, MD  LOS - 1   Chief Complaint  Patient presents with  . Tachycardia  . Shortness of Breath       Brief HPI   Patient is a 54 year old female with diabetes, hypertension, asthma presented to ED with shortness of breath over the past 3 weeks. Patient may have had the symptoms much longer in duration but she noticed it worsened to 3 weeks. Patient reported that it had gotten so severe that she was out of breath just walking to her car a day before the admission. She was admitted for further workup.   Assessment & Plan    Principal Problem:   Acute on chronic respiratory failure with history of chronic dyspnea on exertion, evidence of chronic interstitial lung disease - Improving, currently O2 sats 96% on 2 L - Pulmonology consulted, following recommendations - Home O2 evaluation done, qualifies for O2 with ambulation, ordered DME O2  - HRCT chest showed interstitial lung disease could be severe fibrotic base nonspecific, mediastinal and bilateral hilar lymphadenopathy - No steroids for now until autoimmune panel results are back - Outpatient pulmonology follow-up arranged, autoimmune panel needs to be followed up outpatient - 2-D echo results pending  Active Problems:   DM2 (diabetes mellitus, type 2) - Continue sliding scale insulin, hemoglobin A1c 8.6., Continue Lantus    HTN (hypertension) - Currently stable    ILD (interstitial lung disease) - As #1  Code Status: full code  Family Communication: Discussed in detail with the patient, all imaging results, lab results explained to the patient  at bedside   Disposition Plan:  Pending 2-D echo  results  Time Spent in minutes  25 minutes  Procedures  CT chest  Consults   Pulmonology  DVT Prophylaxis   heparin  Medications  Scheduled Meds: . amLODipine  5 mg Oral Daily  . antiseptic oral rinse  7 mL Mouth Rinse BID  . cholecalciferol  1,000 Units Oral Daily  . dicyclomine  20 mg Oral TID AC & HS  . DULoxetine  60 mg Oral Daily  . estrogens (conjugated)  0.625 mg Oral Daily  . heparin  5,000 Units Subcutaneous 3 times per day  . indomethacin  25 mg Oral BID WC  . insulin aspart  0-20 Units Subcutaneous TID WC  . insulin aspart  0-5 Units Subcutaneous QHS  . insulin glargine  43 Units Subcutaneous QHS  . ipratropium-albuterol  3 mL Nebulization TID  . levothyroxine  200 mcg Oral QAC breakfast  . mometasone-formoterol  2 puff Inhalation BID  . pantoprazole  80 mg Oral Q1200  . topiramate  25 mg Oral BID  . traZODone  75 mg Oral QHS   Continuous Infusions:  PRN Meds:.acetaminophen, albuterol, ALPRAZolam, HYDROcodone-acetaminophen, meclizine, ondansetron   Antibiotics  Anti-infectives    None        Subjective:   Joann [name] was seen and examined today. Patient denies dizziness, chest pain, abdominal pain, N/V/D/C, new weakness, numbess, tingling. No acute events overnight. Feels somewhat better today, no chest pain or wheezing, fevers or chills.  Objective:   Blood pressure 134/71, pulse 79, temperature 98 F (36.7 C), temperature source Oral, resp. rate 20, height 4\' 11"  (1.499 m), weight 86.1 kg (189 lb 13.1 oz), SpO2 94 %.  Wt Readings from Last 3 Encounters:  02/28/15 86.1 kg (189 lb 13.1 oz)  09/12/14 84.369 kg (186 lb)  12/14/13 84.369 kg (186 lb)     Intake/Output Summary (Last 24 hours) at 03/01/15 1217 Last data filed at 03/01/15 1015  Gross per 24 hour  Intake    640 ml  Output    800 ml  Net   -160 ml    Exam  General: Alert and oriented x 3, NAD   HEENT:  PERRLA, EOMI, Anicteic Sclera  Neck: Supple, no JVD  CVS: S1 S2  clear RRR  Respiratory: Bibasilar crackles  Abdomen: Morbidly obese, Soft, NT, ND, NBS  Ext: no cyanosis clubbing or edema  Neuro: AAOx3, Cr N's II- XII. Strength 5/5 upper and lower extremities bilaterally  Skin: No rashes  Psych: Normal affect and demeanor, alert and oriented x3    Data Review   Micro Results No results found for this or any previous visit (from the past 240 hour(s)).  Radiology Reports Dg Chest 2 View  02/27/2015   CLINICAL DATA:  Short of breath. Intermittent tachycardia over the last several weeks.  EXAM: CHEST  2 VIEW  COMPARISON:  12/31/2014.  06/25/2014.  FINDINGS: The heart is mildly enlarged. Mediastinal shadows are normal. There is chronic lung disease with a diffuse fibrotic pattern and and bronchial thickening. Markings in general are more prominent which could indicate progressive fibrosis or superimposed widespread inflammation. No sign of acute consolidation, collapse or effusion.  IMPRESSION: Chronically progressive fibrotic pattern. Increased markings could be associated with widespread bronchitis. No evidence of dense consolidation or lobar collapse. No effusions.   Electronically Signed   By: Nelson Chimes M.D.   On: 02/27/2015 21:22   Ct Angio Chest Pe W/cm &/or Wo Cm  02/28/2015   CLINICAL DATA:  Tachycardia and shortness of breath, worsening over the past 2 weeks.  EXAM: CT ANGIOGRAPHY CHEST WITH CONTRAST  TECHNIQUE: Multidetector CT imaging of the chest was performed using the standard protocol during bolus administration of intravenous contrast. Multiplanar CT image reconstructions and MIPs were obtained to evaluate the vascular anatomy.  CONTRAST:  51mL OMNIPAQUE IOHEXOL 350 MG/ML SOLN  COMPARISON:  None.  FINDINGS: THORACIC INLET/BODY WALL:  No acute abnormality.  MEDIASTINUM:  Normal heart size. No pericardial effusion. Bilateral hilar and mediastinal lymphadenopathy, likely related to the pulmonary fibrosis. CTA of the pulmonary arteries is  limited by intermittent motion but exam is overall diagnostic and negative for pulmonary embolism.  LUNG WINDOWS:  Low lung volumes with coarse interstitial opacities, traction bronchiectasis, and honeycombing at the bases. There is subpleural predominance and no clear craniocaudal gradient. No gross pneumonia is identified. Patchy ground-glass density could reflect active alveolitis (versus atelectasis).  UPPER ABDOMEN:  Hepatic steatosis and probable enlargement.  Cholecystectomy.  OSSEOUS:  No acute fracture.  No suspicious lytic or blastic lesions.  Review of the MIP images confirms the above findings.  IMPRESSION: 1. No evidence of pulmonary embolism. 2. 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 By x-ray, fibrosis has increased over the past year; patchy ground-glass density on the current study may reflect active alveolitis. 3. Mediastinal and hilar lymphadenopathy, nonspecific but likely reactive to #2. 4. Hepatic steatosis.   Electronically Signed   By: Monte Fantasia M.D.   On: 02/28/2015 00:32   Ct Chest High Resolution  02/28/2015   CLINICAL DATA:  54 year old female with shortness of breath and chest pain since yesterday evening. Intermittent tachycardia over the past several weeks, particularly during exertion.  EXAM: CT CHEST WITHOUT CONTRAST  TECHNIQUE: Multidetector CT imaging of the chest was performed following the standard protocol without intravenous contrast. High resolution imaging of the lungs, as well as inspiratory and expiratory imaging, was performed.  COMPARISON:  Chest CT 02/28/2015.  FINDINGS: Mediastinum/Lymph Nodes: Heart size is size is normal. There is no significant pericardial fluid, thickening or pericardial calcification. Multiple borderline enlarged and mildly enlarged mediastinal and hilar lymph nodes are noted, largest of which measures up to 15 mm in short axis in the subcarinal nodal station. Esophagus is unremarkable in appearance. No axillary  lymphadenopathy.  Lungs/Pleura: High-resolution images demonstrate extensive areas of peripheral subpleural and peribronchovascular reticulation, with marked thickening of the peribronchovascular interstitium throughout the lungs bilaterally. This is associated with areas of cylindrical and mild varicose bronchiectasis. Scattered areas of ground-glass attenuation and scattered areas of mild honeycombing are also noted. No definitive craniocaudal gradient. Inspiratory and expiratory imaging is unremarkable. No acute consolidative airspace disease. No pleural effusions.  Upper Abdomen: Diffuse low attenuation throughout the hepatic parenchyma, compatible with hepatic steatosis. Status post cholecystectomy.  Musculoskeletal/Soft Tissues: There are no aggressive appearing lytic or blastic lesions noted in the visualized portions of the skeleton.  IMPRESSION: 1. The appearance of the lungs is compatible with underlying interstitial lung disease, and despite the lack of a clear cut craniocaudal gradient, the overall imaging findings are most suggestive of usual interstitial pneumonia. Conceivably, these findings could also be seen in setting of severe fibrotic phase nonspecific interstitial pneumonia (NSIP), however, that is not strongly favored. Repeat high-resolution chest CT might be useful in 12 months to assess for temporal changes in the appearance of the lung parenchyma if clinically appropriate. 2. Mediastinal and bilateral hilar lymphadenopathy noted, likely related to underlying interstitial lung disease. Attention at time of follow-up imaging is recommended. 3. Hepatic steatosis.   Electronically Signed   By: Vinnie Langton M.D.   On: 02/28/2015 14:39    CBC  Recent Labs Lab 02/27/15 1843 03/01/15 0720  WBC 9.6 9.6  HGB 11.7* 10.9*  HCT 36.0 34.0*  PLT 329 310  MCV 86.5 88.1  MCH 28.1 28.2  MCHC 32.5 32.1  RDW 14.8 15.3    Chemistries   Recent Labs Lab 02/27/15 1843 02/28/15 1152  03/01/15 0720  NA 137 134* 137  K 4.5 5.1 4.3  CL 105 102 102  CO2 21 19 24   GLUCOSE 183* 238* 133*  BUN 15 19 20   CREATININE 1.21* 1.32* 1.24*  CALCIUM 9.2 9.2 9.2   ------------------------------------------------------------------------------------------------------------------ estimated creatinine clearance is 49.5 mL/min (by C-G formula based on Cr of 1.24). ------------------------------------------------------------------------------------------------------------------  Recent Labs  02/27/15 1843  HGBA1C 8.6*   ------------------------------------------------------------------------------------------------------------------ No results for input(s): CHOL, HDL, LDLCALC, TRIG, CHOLHDL, LDLDIRECT in the last 72 hours. ------------------------------------------------------------------------------------------------------------------ No results for input(s): TSH, T4TOTAL, T3FREE, THYROIDAB in the last 72 hours.  Invalid input(s): FREET3 ------------------------------------------------------------------------------------------------------------------ No results for input(s): VITAMINB12, FOLATE, FERRITIN, TIBC, IRON, RETICCTPCT in the last 72 hours.  Coagulation profile No results for input(s): INR, PROTIME in the last 168 hours.   Recent Labs  02/27/15 2200  DDIMER 0.96*    Cardiac Enzymes  Recent Labs Lab 02/28/15 1152  CKMB 1.9   ------------------------------------------------------------------------------------------------------------------ Invalid input(s): POCBNP   Recent Labs  02/28/15 0353 02/28/15 0756 02/28/15 1200 02/28/15 1649 02/28/15 2059 03/01/15 0744  GLUCAP 198* 228* 206* 141* 164* 127*     Kanoelani Dobies M.D. [facility] 03/01/2015, 12:17 PM  Pager: [phone]   Between 7am to 7pm - call Pager - [phone]  After 7pm go to [email or web address] - password [email or web address]  Call night coverage person covering after 7pm

## 2015-03-02 LAB — GLUCOSE, CAPILLARY
GLUCOSE-CAPILLARY: 196 mg/dL — AB (ref 70–99)
GLUCOSE-CAPILLARY: 196 mg/dL — AB (ref 70–99)
Glucose-Capillary: 215 mg/dL — ABNORMAL HIGH (ref 70–99)

## 2015-03-02 NOTE — Progress Notes (Signed)
Patient verbalized understanding. No question or concerns noted. Patient escorted via wheelchair with home O2

## 2015-03-02 NOTE — Discharge Summary (Signed)
Physician Discharge Summary   Patient ID: Joann [name] MRN: [id number] DOB/AGE: 55/20/1962 55 y.o.  Admit date: 02/27/2015 Discharge date: 03/02/2015  Primary Care Physician:  Velna Hatchet, MD  Discharge Diagnoses:    . IPF (idiopathic pulmonary fibrosis)- new diagnosis  . HTN (hypertension) . Acute on chronic respiratory failure, with hypoxia  . ILD (interstitial lung disease)  Consults:  Pulmonology, Dr. Chase Caller   Recommendations for Outpatient Follow-up:  Please follow autoimmune workup for interstitial lung disease    DIET: Heart healthy diet    Allergies:   Allergies  Allergen Reactions  . Peanut-Containing Drug Products Shortness Of Breath    Pecans, walnuts as well  . Sulfa Antibiotics Hives and Shortness Of Breath  . Betadine [Povidone Iodine] Rash  . Latex Rash     Discharge Medications:   Medication List    STOP taking these medications        lisinopril 20 MG tablet  Commonly known as:  PRINIVIL,ZESTRIL     potassium chloride 10 MEQ tablet  Commonly known as:  K-DUR     triamterene-hydrochlorothiazide 37.5-25 MG per tablet  Commonly known as:  MAXZIDE-25      TAKE these medications        albuterol 108 (90 BASE) MCG/ACT inhaler  Commonly known as:  PROVENTIL HFA;VENTOLIN HFA  Inhale 2 puffs into the lungs every 6 (six) hours as needed. For shortness of breath     ALPRAZolam 0.5 MG tablet  Commonly known as:  XANAX  Take 0.5 mg by mouth daily as needed for anxiety.     amLODipine 5 MG tablet  Commonly known as:  NORVASC  Take 5 mg by mouth daily.     cholecalciferol 1000 UNITS tablet  Commonly known as:  VITAMIN D  Take 1,000 Units by mouth daily.     dicyclomine 20 MG tablet  Commonly known as:  BENTYL  Take 20 mg by mouth every 6 (six) hours.     DULoxetine 60 MG capsule  Commonly known as:  CYMBALTA  Take 60 mg by mouth daily.     esomeprazole 40 MG capsule  Commonly known as:  NEXIUM  Take 40 mg by mouth 2 (two)  times daily before a meal.     estrogens (conjugated) 0.625 MG tablet  Commonly known as:  PREMARIN  Take 0.625 mg by mouth daily.     Fluticasone-Salmeterol 100-50 MCG/DOSE Aepb  Commonly known as:  ADVAIR  Inhale 1 puff into the lungs every 12 (twelve) hours.     indomethacin 25 MG capsule  Commonly known as:  INDOCIN  Take 25 mg by mouth 2 (two) times daily with a meal.     insulin aspart 100 UNIT/ML injection  Commonly known as:  novoLOG  Inject 15 Units into the skin daily as needed for high blood sugar (with largestmeal if sugar is over 200).     insulin glargine 100 UNIT/ML injection  Commonly known as:  LANTUS  Inject 50 Units into the skin at bedtime.     levothyroxine 200 MCG tablet  Commonly known as:  SYNTHROID, LEVOTHROID  Take 200 mcg by mouth daily before breakfast.     meclizine 25 MG tablet  Commonly known as:  ANTIVERT  Take 25 mg by mouth 3 (three) times daily as needed for dizziness.     metFORMIN 500 MG 24 hr tablet  Commonly known as:  GLUCOPHAGE-XR  Take 1,000 mg by mouth 2 (two) times daily.  ondansetron 4 MG tablet  Commonly known as:  ZOFRAN  Take 4 mg by mouth every 8 (eight) hours as needed for nausea or vomiting.     topiramate 25 MG tablet  Commonly known as:  TOPAMAX  Take 25 mg by mouth 2 (two) times daily.     traZODone 150 MG tablet  Commonly known as:  DESYREL  Take 75 mg by mouth at bedtime.     vitamin E 400 UNIT capsule  Take 800 Units by mouth daily.         Brief H and P: For complete details please refer to admission H and P, but in briefPatient is a 54 year old female with diabetes, hypertension, asthma presented to ED with shortness of breath over the past 3 weeks. Patient may have had the symptoms much longer in duration but she noticed it worsened to 3 weeks. Patient reported that it had gotten so severe that she was out of breath just walking to her car a day before the admission. She was admitted for further  workup.  Hospital Course:  Acute on chronic respiratory failure with history of chronic dyspnea on exertion, evidence of chronic interstitial lung disease - Improving, currently O2 sats 96% on 2 L. Patient qualifies for home O2. O2 sats at rest 94% on ambulating 88%. Pulmonology was consulted, patient was followed by Dr. Chase Caller. High resolution CT chest was done which showed interstitial lung disease could be severe fibrotic base nonspecific, mediastinal and bilateral hilar lymphadenopathy. Per pulmonology,no steroids for now until autoimmune panel results are back Outpatient pulmonology follow-up arranged, autoimmune panel will be followed outpatient by Dr. Chase Caller. 2-D echo results showed EF of 78-29%, grade 1 diastolic dysfunction   DM2 (diabetes mellitus, type 2) uncontrolled - Continue sliding scale insulin, hemoglobin A1c 8.6., Continue Lantus 50 units at home with sliding scale   HTN (hypertension) - Currently stable   ILD (interstitial lung disease) - As #1  Day of Discharge BP 118/78 mmHg  Pulse 94  Temp(Src) 97.5 F (36.4 C) (Oral)  Resp 20  Ht 4\' 11"  (1.499 m)  Wt 86.1 kg (189 lb 13.1 oz)  BMI 38.32 kg/m2  SpO2 98%  Physical Exam: General: Alert and awake oriented x3 not in any acute distress. HEENT: anicteric sclera, pupils reactive to light and accommodation CVS: S1-S2 clear no murmur rubs or gallops Chest: Bibasilar crackles Abdomen: soft nontender, nondistended, normal bowel sounds Extremities: no cyanosis, clubbing or edema noted bilaterally Neuro: Cranial nerves II-XII intact, no focal neurological deficits   The results of significant diagnostics from this hospitalization (including imaging, microbiology, ancillary and laboratory) are listed below for reference.    LAB RESULTS: Basic Metabolic Panel:  Recent Labs Lab 02/28/15 1152 03/01/15 0720  NA 134* 137  K 5.1 4.3  CL 102 102  CO2 19 24  GLUCOSE 238* 133*  BUN 19 20  CREATININE 1.32*  1.24*  CALCIUM 9.2 9.2   Liver Function Tests: No results for input(s): AST, ALT, ALKPHOS, BILITOT, PROT, ALBUMIN in the last 168 hours. No results for input(s): LIPASE, AMYLASE in the last 168 hours. No results for input(s): AMMONIA in the last 168 hours. CBC:  Recent Labs Lab 02/27/15 1843 03/01/15 0720  WBC 9.6 9.6  HGB 11.7* 10.9*  HCT 36.0 34.0*  MCV 86.5 88.1  PLT 329 310   Cardiac Enzymes:  Recent Labs Lab 02/28/15 1152  CKTOTAL 148  CKMB 1.9   BNP: Invalid input(s): POCBNP CBG:  Recent Labs Lab 03/01/15  2114 03/02/15 0808  GLUCAP 196* 196*    Significant Diagnostic Studies:  Dg Chest 2 View  02/27/2015   CLINICAL DATA:  Short of breath. Intermittent tachycardia over the last several weeks.  EXAM: CHEST  2 VIEW  COMPARISON:  12/31/2014.  06/25/2014.  FINDINGS: The heart is mildly enlarged. Mediastinal shadows are normal. There is chronic lung disease with a diffuse fibrotic pattern and and bronchial thickening. Markings in general are more prominent which could indicate progressive fibrosis or superimposed widespread inflammation. No sign of acute consolidation, collapse or effusion.  IMPRESSION: Chronically progressive fibrotic pattern. Increased markings could be associated with widespread bronchitis. No evidence of dense consolidation or lobar collapse. No effusions.   Electronically Signed   By: Nelson Chimes M.D.   On: 02/27/2015 21:22   Ct Angio Chest Pe W/cm &/or Wo Cm  02/28/2015   CLINICAL DATA:  Tachycardia and shortness of breath, worsening over the past 2 weeks.  EXAM: CT ANGIOGRAPHY CHEST WITH CONTRAST  TECHNIQUE: Multidetector CT imaging of the chest was performed using the standard protocol during bolus administration of intravenous contrast. Multiplanar CT image reconstructions and MIPs were obtained to evaluate the vascular anatomy.  CONTRAST:  23mL OMNIPAQUE IOHEXOL 350 MG/ML SOLN  COMPARISON:  None.  FINDINGS: THORACIC INLET/BODY WALL:  No acute  abnormality.  MEDIASTINUM:  Normal heart size. No pericardial effusion. Bilateral hilar and mediastinal lymphadenopathy, likely related to the pulmonary fibrosis. CTA of the pulmonary arteries is limited by intermittent motion but exam is overall diagnostic and negative for pulmonary embolism.  LUNG WINDOWS:  Low lung volumes with coarse interstitial opacities, traction bronchiectasis, and honeycombing at the bases. There is subpleural predominance and no clear craniocaudal gradient. No gross pneumonia is identified. Patchy ground-glass density could reflect active alveolitis (versus atelectasis).  UPPER ABDOMEN:  Hepatic steatosis and probable enlargement.  Cholecystectomy.  OSSEOUS:  No acute fracture.  No suspicious lytic or blastic lesions.  Review of the MIP images confirms the above findings.  IMPRESSION: 1. No evidence of pulmonary embolism. 2. Pulmonary fibrosis with a usual interstitial pneumonia pattern. By x-ray, fibrosis has increased over the past year; patchy ground-glass density on the current study may reflect active alveolitis. 3. Mediastinal and hilar lymphadenopathy, nonspecific but likely reactive to #2. 4. Hepatic steatosis.   Electronically Signed   By: Monte Fantasia M.D.   On: 02/28/2015 00:32   Ct Chest High Resolution  02/28/2015   CLINICAL DATA:  54 year old female with shortness of breath and chest pain since yesterday evening. Intermittent tachycardia over the past several weeks, particularly during exertion.  EXAM: CT CHEST WITHOUT CONTRAST  TECHNIQUE: Multidetector CT imaging of the chest was performed following the standard protocol without intravenous contrast. High resolution imaging of the lungs, as well as inspiratory and expiratory imaging, was performed.  COMPARISON:  Chest CT 02/28/2015.  FINDINGS: Mediastinum/Lymph Nodes: Heart size is size is normal. There is no significant pericardial fluid, thickening or pericardial calcification. Multiple borderline enlarged and mildly  enlarged mediastinal and hilar lymph nodes are noted, largest of which measures up to 15 mm in short axis in the subcarinal nodal station. Esophagus is unremarkable in appearance. No axillary lymphadenopathy.  Lungs/Pleura: High-resolution images demonstrate extensive areas of peripheral subpleural and peribronchovascular reticulation, with marked thickening of the peribronchovascular interstitium throughout the lungs bilaterally. This is associated with areas of cylindrical and mild varicose bronchiectasis. Scattered areas of ground-glass attenuation and scattered areas of mild honeycombing are also noted. No definitive craniocaudal gradient. Inspiratory and  expiratory imaging is unremarkable. No acute consolidative airspace disease. No pleural effusions.  Upper Abdomen: Diffuse low attenuation throughout the hepatic parenchyma, compatible with hepatic steatosis. Status post cholecystectomy.  Musculoskeletal/Soft Tissues: There are no aggressive appearing lytic or blastic lesions noted in the visualized portions of the skeleton.  IMPRESSION: 1. The appearance of the lungs is compatible with underlying interstitial lung disease, and despite the lack of a clear cut craniocaudal gradient, the overall imaging findings are most suggestive of usual interstitial pneumonia. Conceivably, these findings could also be seen in setting of severe fibrotic phase nonspecific interstitial pneumonia (NSIP), however, that is not strongly favored. Repeat high-resolution chest CT might be useful in 12 months to assess for temporal changes in the appearance of the lung parenchyma if clinically appropriate. 2. Mediastinal and bilateral hilar lymphadenopathy noted, likely related to underlying interstitial lung disease. Attention at time of follow-up imaging is recommended. 3. Hepatic steatosis.   Electronically Signed   By: Vinnie Langton M.D.   On: 02/28/2015 14:39    2D ECHO: Study Conclusions  - Left ventricle: The cavity size  was normal. Wall thickness was normal. Systolic function was normal. The estimated ejection fraction was in the range of 55% to 60%. Wall motion was normal; there were no regional wall motion abnormalities. Doppler parameters are consistent with abnormal left ventricular relaxation (grade 1 diastolic dysfunction). - Mitral valve: There was mild regurgitation. - Atrial septum: No defect or patent foramen ovale was identified.  Disposition and Follow-up:     Discharge Instructions    Diet Carb Modified    Complete by:  As directed      Increase activity slowly    Complete by:  As directed             DISPOSITION: Home   DISCHARGE FOLLOW-UP Follow-up Information    Follow up with [facility], MD On 04/02/2015.   Specialty:  Pulmonary Disease   Why:  Appt at 3:45 PM at [facility] (across the road from [facility])   Contact information:   [place] [address] [phone]       Follow up with Velna Hatchet, MD. Schedule an appointment as soon as possible for a visit in 10 days.   Specialty:  Internal Medicine   Why:  for hospital follow-up   Contact information:   [place] [address] [phone]        Time spent on Discharge: 35 minutes  Signed:   Brooklyn Alfredo M.D. [facility] 03/02/2015, 12:18 PM Pager: [phone]

## 2015-03-04 LAB — HYPERSENSITIVITY PNEUMONITIS
A. Pullulans Abs: NEGATIVE
A.Fumigatus #1 Abs: NEGATIVE
Micropolyspora faeni, IgG: NEGATIVE
Pigeon Serum Abs: NEGATIVE
Thermoact. Saccharii: NEGATIVE
Thermoactinomyces vulgaris, IgG: NEGATIVE

## 2015-03-04 LAB — ANCA TITERS
C-ANCA: <1:20 {titer}
P-ANCA: <1:20 {titer}

## 2015-03-04 NOTE — Telephone Encounter (Signed)
Called and spoke to pt. Appt made with TP on 03/13/15. Pt verbalized understanding and denied any further questions or concerns at this time.

## 2015-03-11 ENCOUNTER — Other Ambulatory Visit: Payer: Self-pay

## 2015-03-11 NOTE — Patient Outreach (Signed)
Patient called to tell me she went to the emergency room with complaint of shortness of breath. She has been diagnosed with idiopathic pulmonary fibrosis and requires 2 litres of oxygen at all times.  She has been taken out of work and will follow up with the pulmonologist in early May, 2016.  I have encouraged her to call human resources for assistance with sick benefits.   Gentry Fitz, RN, BA, MHA, CDE Diabetes Coordinator Inpatient Diabetes Program  [phone] (Team Pager) [phone] [facility]) 03/11/2015 11:49 AM

## 2015-03-13 ENCOUNTER — Encounter: Payer: Self-pay | Admitting: Adult Health

## 2015-03-13 ENCOUNTER — Ambulatory Visit (INDEPENDENT_AMBULATORY_CARE_PROVIDER_SITE_OTHER): Payer: 59 | Admitting: Adult Health

## 2015-03-13 VITALS — BP 104/70 | HR 83 | Temp 98.1°F | Ht 59.0 in | Wt 186.0 lb

## 2015-03-13 DIAGNOSIS — J9621 Acute and chronic respiratory failure with hypoxia: Secondary | ICD-10-CM | POA: Diagnosis not present

## 2015-03-13 DIAGNOSIS — J849 Interstitial pulmonary disease, unspecified: Secondary | ICD-10-CM | POA: Diagnosis not present

## 2015-03-13 NOTE — Progress Notes (Signed)
   Subjective:    Patient ID: Joann [name], female    DOB: 1961-03-20, 54 y.o.   MRN: [id number]  HPI 54 yo   03/13/2015 [facility] follow up  Pt returns for a post hospital follow up for acute resp failure .  Seen for pulmonary consult during hospital admit.  HRCT showed ILD changes , favoring NSIP .  2-D echo results showed EF of 33-54%, grade 1 diastolic dysfunction Autoimmune panel neg except RA factor positive  FH of RA.  Discharged on O2 . 2l/m rest and 3l/m act .  She is a surgical tech at [employer]  Breathing has not changed much , has DOE with minmal activity .  Spirometry shows severe restriction with FVC 44%, ratio 93 and FEV 1 48%.  Denies cp , hemoptyiss , edema or feve.r     Review of Systems Constitutional:   No  weight loss, night sweats,  Fevers, chills, + fatigue, or  lassitude.  HEENT:   No headaches,  Difficulty swallowing,  Tooth/dental problems, or  Sore throat,                No sneezing, itching, ear ache, nasal congestion, post nasal drip,   CV:  No chest pain,  Orthopnea, PND, swelling in lower extremities, anasarca, dizziness, palpitations, syncope.   GI  No heartburn, indigestion, abdominal pain, nausea, vomiting, diarrhea, change in bowel habits, loss of appetite, bloody stools.   Resp:  .  No chest wall deformity  Skin: no rash or lesions.  GU: no dysuria, change in color of urine, no urgency or frequency.  No flank pain, no hematuria   MS:  No joint pain or swelling.  No decreased range of motion.  No back pain.  Psych:  No change in mood or affect. No depression or anxiety.  No memory loss.         Objective:   Physical Exam GEN: A/Ox3; pleasant , NAD  HEENT:  Sims/AT,  EACs-clear, TMs-wnl, NOSE-clear, THROAT-clear, no lesions, no postnasal drip or exudate noted.   NECK:  Supple w/ fair ROM; no JVD; normal carotid impulses w/o bruits; no thyromegaly or nodules palpated; no lymphadenopathy.  RESP  Faint basilar rales no accessory muscle  use, no dullness to percussion  CARD:  RRR, no m/r/g  , no peripheral edema, pulses intact, no cyanosis or clubbing.  GI:   Soft & nt; nml bowel sounds; no organomegaly or masses detected.  Musco: Warm bil, no deformities or joint swelling noted.   Neuro: alert, no focal deficits noted.    Skin: Warm, no lesions or rashes         Assessment & Plan:

## 2015-03-13 NOTE — Patient Instructions (Addendum)
Continue on Oxygen 2l/m at rest , 3l/m with activity .  Order sent for portable concentrator evaluation .  We are referring you to thoracic surgeon for lung biopsy.  Follow up Dr. Chase Caller in 4-6 weeks with PFT  Please contact office for sooner follow up if symptoms do not improve or worsen or seek emergency care

## 2015-03-18 ENCOUNTER — Encounter: Payer: 59 | Admitting: Surgery

## 2015-03-19 ENCOUNTER — Institutional Professional Consult (permissible substitution) (INDEPENDENT_AMBULATORY_CARE_PROVIDER_SITE_OTHER): Payer: 59 | Admitting: Surgery

## 2015-03-19 ENCOUNTER — Encounter: Payer: Self-pay | Admitting: Surgery

## 2015-03-19 ENCOUNTER — Other Ambulatory Visit: Payer: Self-pay | Admitting: *Deleted

## 2015-03-19 VITALS — BP 120/79 | HR 110 | Resp 20 | Ht 59.0 in | Wt 186.0 lb

## 2015-03-19 DIAGNOSIS — J849 Interstitial pulmonary disease, unspecified: Secondary | ICD-10-CM

## 2015-03-19 NOTE — Assessment & Plan Note (Signed)
Will refer to TS for consideration of Bx.  RA factor is positive will most likely need referral to Rheumatology .  Await bx results.   Plan  Continue on Oxygen 2l/m at rest , 3l/m with activity .  Order sent for portable concentrator evaluation .  We are referring you to thoracic surgeon for lung biopsy.  Follow up Dr. Chase [name] in 4-6 weeks with PFT  Please contact office for sooner follow up if symptoms do not improve or worsen or seek emergency care

## 2015-03-19 NOTE — Assessment & Plan Note (Signed)
Continue on Oxygen 2l/m at rest , 3l/m with activity .  Order sent for portable concentrator evaluation .  Please contact office for sooner follow up if symptoms do not improve or worsen or seek emergency care

## 2015-03-19 NOTE — Progress Notes (Signed)
Cardiothoracic Surgery Consultation  PCP is Velna Hatchet, MD Referring Provider is Brand Males, MD  Chief Complaint  Patient presents with  . Interstitial Lung Disease    Surgical eval, PFT's 03/13/15, Chest CT 02/28/15    HPI:  The patient is a 54 year old nonsmoker with a past medical history of diabetes, fibromyalgia, IBS, interstitial cystitis, hypothyroidism, depression, asthma, and sleep apnea who reports a long history of shortness of breath. She was recently admitted with shortness of breath and a high resolution chest CT showed changes of interstitial fibrosis. She has never smoked but her parents were heavy smokers and her mother died of lung cancer. Her father had rheumatoid arthritis and rheumatoid lung disease and COPD and recently expired. She had a long exposure to a wood stove growing up. 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 She has shortness of breath with minimal activity. During her most recent admission she had an immunological workup started.  Past Medical History  Diagnosis Date  . Asthma   . Diabetes mellitus   . Headache(784.0)     MIGRAINES  . Fibromyalgia   . GERD (gastroesophageal reflux disease)   . IBS (irritable bowel syndrome)   . Renal mass, right   . IC (interstitial cystitis)   . Frequency of urination   . Anemia   . Swelling of both lower extremities     TAKES MAXIDE FOR SWELLING (DENIES HBP)  . Hypothyroidism   . Depression   . Cancer     kidney  . Hypertension   . Wears glasses   . Sleep apnea     uses a cpap    Past Surgical History  Procedure Laterality Date  . Cesarean section  1983, 1985     x 2  . Bladder tack    . Cystocopy    . Cystoscopy    . Robotic assited partial nephrectomy Right 12/14/2013    Procedure: ROBOTIC ASSITED PARTIAL NEPHRECTOMY;  Surgeon: Dutch Gray, MD;  Location: [facility];  Service: Urology;  Laterality: Right;  . Cystoscopy w/ ureteral stent  placement Right 12/14/2013    Procedure: CYSTOSCOPY WITH RETROGRADE PYELOGRAM/URETERAL STENT PLACEMENT;  Surgeon: Dutch Gray, MD;  Location: [facility];  Service: Urology;  Laterality: Right;  . Cervical laminectomy  07/2012    anterior with plates and screws.  . Abdominal hysterectomy  1996  . Cholecystectomy  1995    tubal ligation also  . Posterior cervical laminectomy  2004  . Tubal ligation    . Colonoscopy    . Incisional hernia repair N/A 09/12/2014    Procedure: HERNIA REPAIR INCISIONAL;  Surgeon: Coralie Keens, MD;  Location: [place];  Service: General;  Laterality: N/A;  . Insertion of mesh N/A 09/12/2014    Procedure: INSERTION OF MESH;  Surgeon: Coralie Keens, MD;  Location: [place];  Service: General;  Laterality: N/A;    Family History  Problem Relation Age of Onset  . Cancer Mother     breast, lung  . COPD Father   . Hypertension Father   . Hyperlipidemia Father   . Stroke Father     Social History History  Substance Use Topics  . Smoking status: Never Smoker   . Smokeless tobacco: Not on file  . Alcohol Use: Yes     Comment: occasional    Current Outpatient Prescriptions  Medication Sig Dispense Refill  .  albuterol (PROVENTIL HFA;VENTOLIN HFA) 108 (90 BASE) MCG/ACT inhaler Inhale 2 puffs into the lungs every 6 (six) hours as needed. For shortness of breath    . ALPRAZolam (XANAX) 0.5 MG tablet Take 0.5 mg by mouth daily as needed for anxiety.     Marland Kitchen amLODipine (NORVASC) 5 MG tablet Take 5 mg by mouth daily.    . cholecalciferol (VITAMIN D) 1000 UNITS tablet Take 1,000 Units by mouth daily.    Marland Kitchen dicyclomine (BENTYL) 20 MG tablet Take 20 mg by mouth every 6 (six) hours.    . DULoxetine (CYMBALTA) 60 MG capsule Take 60 mg by mouth daily.    Marland Kitchen esomeprazole (NEXIUM) 40 MG capsule Take 40 mg by mouth 2 (two) times daily before a meal.     . estrogens, conjugated, (PREMARIN) 0.625 MG tablet Take 0.625 mg by mouth daily.     .  Fluticasone-Salmeterol (ADVAIR) 100-50 MCG/DOSE AEPB Inhale 1 puff into the lungs as needed.     . indomethacin (INDOCIN) 25 MG capsule Take 25 mg by mouth 2 (two) times daily with a meal.    . insulin aspart (NOVOLOG) 100 UNIT/ML injection Inject 15 Units into the skin daily as needed for high blood sugar (with largestmeal if sugar is over 200).    . insulin glargine (LANTUS) 100 UNIT/ML injection Inject 50 Units into the skin at bedtime.     Marland Kitchen levothyroxine (SYNTHROID, LEVOTHROID) 200 MCG tablet Take 200 mcg by mouth daily before breakfast.    . meclizine (ANTIVERT) 25 MG tablet Take 25 mg by mouth 3 (three) times daily as needed for dizziness.    . metFORMIN (GLUCOPHAGE-XR) 500 MG 24 hr tablet Take 1,000 mg by mouth 2 (two) times daily.    . ondansetron (ZOFRAN) 4 MG tablet Take 4 mg by mouth every 8 (eight) hours as needed for nausea or vomiting.    . topiramate (TOPAMAX) 25 MG tablet Take 25 mg by mouth 2 (two) times daily.    . traZODone (DESYREL) 150 MG tablet Take 75 mg by mouth at bedtime.    . vitamin E 400 UNIT capsule Take 800 Units by mouth daily.     No current facility-administered medications for this visit.    Allergies  Allergen Reactions  . Peanut-Containing Drug Products Shortness Of Breath    Pecans, walnuts as well  . Sulfa Antibiotics Hives and Shortness Of Breath  . Betadine [Povidone Iodine] Rash  . Latex Rash    Review of Systems  Constitutional: Positive for activity change, appetite change and fatigue. Negative for fever, chills and unexpected weight change.  HENT:       Loss of smell about 4 years ago  Eyes: Negative.   Respiratory: Positive for cough, chest tightness and shortness of breath.   Cardiovascular: Positive for palpitations. Negative for chest pain and leg swelling.  Gastrointestinal: Negative.   Endocrine: Negative.   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   Skin: Negative.    Allergic/Immunologic: Negative.   Neurological: Positive for headaches.  Hematological: Negative.   Psychiatric/Behavioral:       Depression    BP 120/79 mmHg  Pulse 110  Resp 20  Ht 4\' 11"  (1.499 m)  Wt 186 lb (84.369 kg)  BMI 37.55 kg/m2  SpO2 91% Physical Exam   Diagnostic Tests:  CLINICAL DATA: 54 year old female with shortness of breath and chest pain since yesterday evening. Intermittent tachycardia over the  past several weeks, particularly during exertion.  EXAM: CT CHEST WITHOUT CONTRAST  TECHNIQUE: Multidetector CT imaging of the chest was performed following the standard protocol without intravenous contrast. High resolution imaging of the lungs, as well as inspiratory and expiratory imaging, was performed.  COMPARISON: Chest CT 02/28/2015.  FINDINGS: Mediastinum/Lymph Nodes: Heart size is size is normal. There is no significant pericardial fluid, thickening or pericardial calcification. Multiple borderline enlarged and mildly enlarged mediastinal and hilar lymph nodes are noted, largest of which measures up to 15 mm in short axis in the subcarinal nodal station. Esophagus is unremarkable in appearance. No axillary lymphadenopathy.  Lungs/Pleura: High-resolution images demonstrate extensive areas of peripheral subpleural and peribronchovascular reticulation, with marked thickening of the peribronchovascular interstitium throughout the lungs bilaterally. This is associated with areas of cylindrical and mild varicose bronchiectasis. Scattered areas of ground-glass attenuation and scattered areas of mild honeycombing are also noted. No definitive craniocaudal gradient. Inspiratory and expiratory imaging is unremarkable. No acute consolidative airspace disease. No pleural effusions.  Upper Abdomen: Diffuse low attenuation throughout the hepatic parenchyma, compatible with hepatic steatosis. Status post cholecystectomy.  Musculoskeletal/Soft  Tissues: There are no aggressive appearing lytic or blastic lesions noted in the visualized portions of the skeleton.  IMPRESSION: 1. The appearance of the lungs is compatible with underlying interstitial lung disease, and despite the lack of a clear cut craniocaudal gradient, the overall imaging findings are most suggestive of usual interstitial pneumonia. Conceivably, these findings could also be seen in setting of severe fibrotic phase nonspecific interstitial pneumonia (NSIP), however, that is not strongly favored. Repeat high-resolution chest CT might be useful in 12 months to assess for temporal changes in the appearance of the lung parenchyma if clinically appropriate. 2. Mediastinal and bilateral hilar lymphadenopathy noted, likely related to underlying interstitial lung disease. Attention at time of follow-up imaging is recommended. 3. Hepatic steatosis.   Electronically Signed  By: Vinnie Langton M.D.  On: 02/28/2015 14:39   Impression:  She has worsening shortness of breath and diffuse changes of interstitial lung disease on CT. I think a VATS lung biopsy is indicated to try to pin down a diagnosis for treatment and prognostic purposes. I discussed the procedure of VATS lung biopsy with her including alternatives, benefits and risks including but not limited to bleeding, infection, prolonged air leak and respiratory failure. She understands and agrees to proceed.   Plan:  Right VATS for lung biopsy on Thursday 03/21/2015   Gaye Pollack, MD [facility] [phone]

## 2015-03-20 ENCOUNTER — Encounter (HOSPITAL_COMMUNITY): Payer: Self-pay | Admitting: Anesthesiology

## 2015-03-20 MED ORDER — CEFUROXIME SODIUM 1.5 G IJ SOLR
1.5000 g | INTRAMUSCULAR | Status: AC
  Administered 2015-03-21: 1.5 g via INTRAVENOUS
  Filled 2015-03-20: qty 1.5

## 2015-03-20 MED ORDER — MUPIROCIN 2 % EX OINT
1.0000 "application " | TOPICAL_OINTMENT | Freq: Once | CUTANEOUS | Status: AC
  Administered 2015-03-21: 1 via TOPICAL
  Filled 2015-03-20: qty 22

## 2015-03-20 NOTE — Progress Notes (Signed)
Anesthesia Chart Review:  Pt is 54 year old female scheduled for video assisted thoracoscopy on 03/21/2015 with Dr. Cyndia Bent.   Pt is same day work up.   PMH includes: HTN, DM, OSA, renal cancer, asthma, anemia, hypothyroidism. Never smoker. BMI 37.5. S/p incisional hernia repair 09/12/14. S/p partial nephrectomy 12/14/13.   Medications include: amlodipine, insulin, levothyroxine, metformin, topiramate.   Pt hospitalized 4/20-4/23-2016 for new diagnosis idiopathic pulmonary fibrosis, HTN, acute on chronic respiratory failure with hypoxia, and interstitial lung disease. Discharged on home oxygen, 2L/m at rest and 3L/m with activity.   Chest x-ray 02/27/2015 reviewed. Chronically progressive fibrotic pattern. Increased markings could be associated with widespread bronchitis. No evidence of dense consolidation or lobar collapse. No effusions.  EKG 02/27/2015: NSR. Low voltage QRS. Left posterior fascicular block  Echo 03/01/2015: - Left ventricle: The cavity size was normal. Wall thickness wasnormal. Systolic function was normal. The estimated ejectionfraction was in the range of 55% to 60%. Wall motion was normal;there were no regional wall motion abnormalities. Dopplerparameters are consistent with abnormal left ventricularrelaxation (grade 1 diastolic dysfunction). - Mitral valve: There was mild regurgitation. - Atrial septum: No defect or patent foramen ovale was identified.  Reviewed EKG with Dr. Conrad .   If no changes, I anticipate pt can proceed with surgery as scheduled.   Willeen Cass, FNP-BC [facility] Short Stay [facility]/Anesthesiology Phone: [phone] 03/20/2015 2:50 PM

## 2015-03-21 ENCOUNTER — Inpatient Hospital Stay (HOSPITAL_COMMUNITY)
Admission: RE | Admit: 2015-03-21 | Discharge: 2015-03-25 | DRG: 168 | Disposition: A | Discharge status: Home / Self Care | Payer: 59 | Source: Ambulatory Visit | Attending: Surgery | Admitting: Surgery

## 2015-03-21 ENCOUNTER — Inpatient Hospital Stay (HOSPITAL_COMMUNITY): Payer: 59 | Admitting: Anesthesiology

## 2015-03-21 ENCOUNTER — Inpatient Hospital Stay (HOSPITAL_COMMUNITY): Payer: 59

## 2015-03-21 ENCOUNTER — Telehealth: Payer: Self-pay | Admitting: Internal Medicine

## 2015-03-21 ENCOUNTER — Encounter (HOSPITAL_COMMUNITY)
Admission: RE | Disposition: A | Discharge status: Home / Self Care | Payer: Self-pay | Source: Ambulatory Visit | Attending: Surgery

## 2015-03-21 ENCOUNTER — Encounter (HOSPITAL_COMMUNITY): Payer: Self-pay | Admitting: Certified Registered Nurse Anesthetist

## 2015-03-21 DIAGNOSIS — K5900 Constipation, unspecified: Secondary | ICD-10-CM | POA: Diagnosis not present

## 2015-03-21 DIAGNOSIS — M797 Fibromyalgia: Secondary | ICD-10-CM | POA: Diagnosis present

## 2015-03-21 DIAGNOSIS — Z01811 Encounter for preprocedural respiratory examination: Secondary | ICD-10-CM

## 2015-03-21 DIAGNOSIS — Z794 Long term (current) use of insulin: Secondary | ICD-10-CM

## 2015-03-21 DIAGNOSIS — Z91040 Latex allergy status: Secondary | ICD-10-CM | POA: Diagnosis not present

## 2015-03-21 DIAGNOSIS — E039 Hypothyroidism, unspecified: Secondary | ICD-10-CM | POA: Diagnosis present

## 2015-03-21 DIAGNOSIS — N3010 Interstitial cystitis (chronic) without hematuria: Secondary | ICD-10-CM | POA: Diagnosis present

## 2015-03-21 DIAGNOSIS — F329 Major depressive disorder, single episode, unspecified: Secondary | ICD-10-CM | POA: Diagnosis present

## 2015-03-21 DIAGNOSIS — J45909 Unspecified asthma, uncomplicated: Secondary | ICD-10-CM | POA: Diagnosis present

## 2015-03-21 DIAGNOSIS — J8410 Pulmonary fibrosis, unspecified: Principal | ICD-10-CM | POA: Diagnosis present

## 2015-03-21 DIAGNOSIS — J849 Interstitial pulmonary disease, unspecified: Secondary | ICD-10-CM | POA: Diagnosis present

## 2015-03-21 DIAGNOSIS — Z91010 Allergy to peanuts: Secondary | ICD-10-CM | POA: Diagnosis not present

## 2015-03-21 DIAGNOSIS — Z85528 Personal history of other malignant neoplasm of kidney: Secondary | ICD-10-CM | POA: Diagnosis not present

## 2015-03-21 DIAGNOSIS — I10 Essential (primary) hypertension: Secondary | ICD-10-CM | POA: Diagnosis present

## 2015-03-21 DIAGNOSIS — E119 Type 2 diabetes mellitus without complications: Secondary | ICD-10-CM | POA: Diagnosis present

## 2015-03-21 DIAGNOSIS — G4733 Obstructive sleep apnea (adult) (pediatric): Secondary | ICD-10-CM | POA: Diagnosis present

## 2015-03-21 DIAGNOSIS — Z9689 Presence of other specified functional implants: Secondary | ICD-10-CM

## 2015-03-21 DIAGNOSIS — Z9981 Dependence on supplemental oxygen: Secondary | ICD-10-CM

## 2015-03-21 DIAGNOSIS — K760 Fatty (change of) liver, not elsewhere classified: Secondary | ICD-10-CM | POA: Diagnosis present

## 2015-03-21 DIAGNOSIS — Z91018 Allergy to other foods: Secondary | ICD-10-CM

## 2015-03-21 DIAGNOSIS — K589 Irritable bowel syndrome without diarrhea: Secondary | ICD-10-CM | POA: Diagnosis present

## 2015-03-21 DIAGNOSIS — Z883 Allergy status to other anti-infective agents status: Secondary | ICD-10-CM

## 2015-03-21 DIAGNOSIS — K219 Gastro-esophageal reflux disease without esophagitis: Secondary | ICD-10-CM | POA: Diagnosis present

## 2015-03-21 DIAGNOSIS — I445 Left posterior fascicular block: Secondary | ICD-10-CM | POA: Diagnosis present

## 2015-03-21 HISTORY — PX: LUNG BIOPSY: SHX5088

## 2015-03-21 HISTORY — PX: VIDEO ASSISTED THORACOSCOPY: SHX5073

## 2015-03-21 HISTORY — DX: Pulmonary fibrosis, unspecified: J84.10

## 2015-03-21 HISTORY — DX: Dependence on supplemental oxygen: Z99.81

## 2015-03-21 HISTORY — DX: Pneumonia, unspecified organism: J18.9

## 2015-03-21 HISTORY — DX: Fatty (change of) liver, not elsewhere classified: K76.0

## 2015-03-21 LAB — GLUCOSE, CAPILLARY
GLUCOSE-CAPILLARY: 108 mg/dL — AB (ref 65–99)
Glucose-Capillary: 101 mg/dL — ABNORMAL HIGH (ref 65–99)
Glucose-Capillary: 129 mg/dL — ABNORMAL HIGH (ref 65–99)
Glucose-Capillary: 173 mg/dL — ABNORMAL HIGH (ref 65–99)
Glucose-Capillary: 182 mg/dL — ABNORMAL HIGH (ref 65–99)
Glucose-Capillary: 96 mg/dL (ref 65–99)

## 2015-03-21 LAB — SURGICAL PCR SCREEN
MRSA, PCR: NEGATIVE
STAPHYLOCOCCUS AUREUS: POSITIVE — AB

## 2015-03-21 LAB — COMPREHENSIVE METABOLIC PANEL
ALBUMIN: 3.1 g/dL — AB (ref 3.5–5.0)
ALK PHOS: 99 U/L (ref 38–126)
ALT: 20 U/L (ref 14–54)
AST: 25 U/L (ref 15–41)
Anion gap: 12 (ref 5–15)
BILIRUBIN TOTAL: 0.3 mg/dL (ref 0.3–1.2)
BUN: 19 mg/dL (ref 6–20)
CHLORIDE: 101 mmol/L (ref 101–111)
CO2: 22 mmol/L (ref 22–32)
Calcium: 9 mg/dL (ref 8.9–10.3)
Creatinine, Ser: 1.25 mg/dL — ABNORMAL HIGH (ref 0.44–1.00)
GFR calc Af Amer: 55 mL/min — ABNORMAL LOW (ref >60)
GFR, EST NON AFRICAN AMERICAN: 48 mL/min — AB (ref >60)
Glucose, Bld: 104 mg/dL — ABNORMAL HIGH (ref 65–99)
POTASSIUM: 4.2 mmol/L (ref 3.5–5.1)
SODIUM: 135 mmol/L (ref 135–145)
Total Protein: 7 g/dL (ref 6.5–8.1)

## 2015-03-21 LAB — CBC
HEMATOCRIT: 33 % — AB (ref 36.0–46.0)
HEMOGLOBIN: 10.6 g/dL — AB (ref 12.0–15.0)
MCH: 28 pg (ref 26.0–34.0)
MCHC: 32.1 g/dL (ref 30.0–36.0)
MCV: 87.1 fL (ref 78.0–100.0)
Platelets: 339 10*3/uL (ref 150–400)
RBC: 3.79 MIL/uL — ABNORMAL LOW (ref 3.87–5.11)
RDW: 14.4 % (ref 11.5–15.5)
WBC: 10 10*3/uL (ref 4.0–10.5)

## 2015-03-21 LAB — TYPE AND SCREEN
ABO/RH(D): O POS
Antibody Screen: NEGATIVE

## 2015-03-21 LAB — PROTIME-INR
INR: 1.07 (ref 0.00–1.49)
Prothrombin Time: 14.1 seconds (ref 11.6–15.2)

## 2015-03-21 LAB — ABO/RH: ABO/RH(D): O POS

## 2015-03-21 LAB — APTT: APTT: 29 s (ref 24–37)

## 2015-03-21 SURGERY — VIDEO ASSISTED THORACOSCOPY
Anesthesia: General | Site: Chest | Laterality: Right

## 2015-03-21 MED ORDER — TRIAMTERENE-HCTZ 37.5-25 MG PO TABS
1.0000 | ORAL_TABLET | Freq: Every evening | ORAL | Status: DC
  Administered 2015-03-22 – 2015-03-24 (×3): 1 via ORAL
  Filled 2015-03-21 (×5): qty 1

## 2015-03-21 MED ORDER — ONDANSETRON HCL 4 MG/2ML IJ SOLN
INTRAMUSCULAR | Status: DC | PRN
  Administered 2015-03-21: 4 mg via INTRAVENOUS

## 2015-03-21 MED ORDER — FENTANYL CITRATE (PF) 100 MCG/2ML IJ SOLN
25.0000 ug | INTRAMUSCULAR | Status: DC | PRN
  Administered 2015-03-21: 50 ug via INTRAVENOUS

## 2015-03-21 MED ORDER — DEXTROSE 5 % IV SOLN
1.5000 g | Freq: Two times a day (BID) | INTRAVENOUS | Status: AC
  Administered 2015-03-21 – 2015-03-22 (×2): 1.5 g via INTRAVENOUS
  Filled 2015-03-21 (×3): qty 1.5

## 2015-03-21 MED ORDER — ONDANSETRON HCL 4 MG/2ML IJ SOLN
INTRAMUSCULAR | Status: AC
  Filled 2015-03-21: qty 2

## 2015-03-21 MED ORDER — DIPHENHYDRAMINE HCL 50 MG/ML IJ SOLN: 12.5000 mg | Freq: Four times a day (QID) | INTRAMUSCULAR | Status: DC | PRN

## 2015-03-21 MED ORDER — SODIUM CHLORIDE 0.9 % IJ SOLN: 9.0000 mL | INTRAMUSCULAR | Status: DC | PRN

## 2015-03-21 MED ORDER — PROPOFOL 10 MG/ML IV BOLUS
INTRAVENOUS | Status: DC | PRN
  Administered 2015-03-21: 160 mg via INTRAVENOUS

## 2015-03-21 MED ORDER — INSULIN ASPART 100 UNIT/ML ~~LOC~~ SOLN
0.0000 [IU] | SUBCUTANEOUS | Status: DC
  Administered 2015-03-21: 2 [IU] via SUBCUTANEOUS
  Administered 2015-03-21: 4 [IU] via SUBCUTANEOUS
  Administered 2015-03-22 – 2015-03-23 (×5): 2 [IU] via SUBCUTANEOUS

## 2015-03-21 MED ORDER — DULOXETINE HCL 60 MG PO CPEP
60.0000 mg | ORAL_CAPSULE | Freq: Every day | ORAL | Status: DC
  Administered 2015-03-22 – 2015-03-25 (×4): 60 mg via ORAL
  Filled 2015-03-21 (×5): qty 1

## 2015-03-21 MED ORDER — CETYLPYRIDINIUM CHLORIDE 0.05 % MT LIQD
7.0000 mL | Freq: Two times a day (BID) | OROMUCOSAL | Status: DC
  Administered 2015-03-21 – 2015-03-23 (×5): 7 mL via OROMUCOSAL

## 2015-03-21 MED ORDER — SUGAMMADEX SODIUM 200 MG/2ML IV SOLN
INTRAVENOUS | Status: AC
  Filled 2015-03-21: qty 2

## 2015-03-21 MED ORDER — FENTANYL CITRATE (PF) 250 MCG/5ML IJ SOLN
INTRAMUSCULAR | Status: AC
  Filled 2015-03-21: qty 5

## 2015-03-21 MED ORDER — OXYCODONE HCL 5 MG PO TABS
5.0000 mg | ORAL_TABLET | ORAL | Status: DC | PRN
  Administered 2015-03-23 (×2): 10 mg via ORAL
  Administered 2015-03-24: 5 mg via ORAL
  Administered 2015-03-24 – 2015-03-25 (×2): 10 mg via ORAL
  Filled 2015-03-21: qty 2
  Filled 2015-03-21: qty 1
  Filled 2015-03-21 (×3): qty 2

## 2015-03-21 MED ORDER — SUCCINYLCHOLINE CHLORIDE 20 MG/ML IJ SOLN
INTRAMUSCULAR | Status: DC | PRN
  Administered 2015-03-21: 120 mg via INTRAVENOUS
  Administered 2015-03-21: 80 mg via INTRAVENOUS

## 2015-03-21 MED ORDER — AMLODIPINE BESYLATE 5 MG PO TABS
5.0000 mg | ORAL_TABLET | Freq: Every morning | ORAL | Status: DC
  Administered 2015-03-22 – 2015-03-25 (×4): 5 mg via ORAL
  Filled 2015-03-21 (×5): qty 1

## 2015-03-21 MED ORDER — ONDANSETRON HCL 4 MG/2ML IJ SOLN: 4.0000 mg | Freq: Four times a day (QID) | INTRAMUSCULAR | Status: DC | PRN

## 2015-03-21 MED ORDER — DIPHENHYDRAMINE HCL 12.5 MG/5ML PO ELIX
12.5000 mg | ORAL_SOLUTION | Freq: Four times a day (QID) | ORAL | Status: DC | PRN
  Filled 2015-03-21: qty 5

## 2015-03-21 MED ORDER — PROPOFOL 10 MG/ML IV BOLUS
INTRAVENOUS | Status: AC
  Filled 2015-03-21: qty 20

## 2015-03-21 MED ORDER — MIDAZOLAM HCL 2 MG/2ML IJ SOLN
INTRAMUSCULAR | Status: AC
  Filled 2015-03-21: qty 2

## 2015-03-21 MED ORDER — PHENYLEPHRINE HCL 10 MG/ML IJ SOLN
10.0000 mg | INTRAVENOUS | Status: DC | PRN
  Administered 2015-03-21: 30 ug/min via INTRAVENOUS

## 2015-03-21 MED ORDER — POTASSIUM CHLORIDE 10 MEQ/50ML IV SOLN
10.0000 meq | Freq: Every day | INTRAVENOUS | Status: DC | PRN
  Filled 2015-03-21: qty 50

## 2015-03-21 MED ORDER — ACETAMINOPHEN 160 MG/5ML PO SOLN
1000.0000 mg | Freq: Four times a day (QID) | ORAL | Status: DC
  Administered 2015-03-22: 1000 mg via ORAL
  Filled 2015-03-21: qty 40.6

## 2015-03-21 MED ORDER — FENTANYL CITRATE (PF) 100 MCG/2ML IJ SOLN
INTRAMUSCULAR | Status: AC
  Filled 2015-03-21: qty 4

## 2015-03-21 MED ORDER — LACTATED RINGERS IV SOLN
INTRAVENOUS | Status: DC | PRN
  Administered 2015-03-21 (×2): via INTRAVENOUS

## 2015-03-21 MED ORDER — 0.9 % SODIUM CHLORIDE (POUR BTL) OPTIME
TOPICAL | Status: DC | PRN
  Administered 2015-03-21: 2000 mL

## 2015-03-21 MED ORDER — ALPRAZOLAM 0.5 MG PO TABS: 0.5000 mg | ORAL_TABLET | Freq: Every day | ORAL | Status: DC | PRN

## 2015-03-21 MED ORDER — FENTANYL 10 MCG/ML IV SOLN
INTRAVENOUS | Status: DC
  Administered 2015-03-21: 165 ug via INTRAVENOUS
  Administered 2015-03-21: 12:00:00 via INTRAVENOUS
  Administered 2015-03-22: 90 ug via INTRAVENOUS
  Administered 2015-03-22: 120 ug via INTRAVENOUS
  Administered 2015-03-22: 255 ug via INTRAVENOUS
  Administered 2015-03-22: 165 ug via INTRAVENOUS
  Administered 2015-03-22: 11:00:00 via INTRAVENOUS
  Administered 2015-03-22: 180 ug via INTRAVENOUS
  Administered 2015-03-22: 105 ug via INTRAVENOUS
  Administered 2015-03-23: 90 ug via INTRAVENOUS
  Administered 2015-03-23 (×2): 30 ug via INTRAVENOUS
  Administered 2015-03-23: 180 ug via INTRAVENOUS
  Administered 2015-03-23: 07:00:00 via INTRAVENOUS
  Administered 2015-03-23: 45 ug via INTRAVENOUS
  Filled 2015-03-21 (×4): qty 50

## 2015-03-21 MED ORDER — ALBUTEROL SULFATE HFA 108 (90 BASE) MCG/ACT IN AERS: 2.0000 | INHALATION_SPRAY | Freq: Four times a day (QID) | RESPIRATORY_TRACT | Status: DC | PRN

## 2015-03-21 MED ORDER — SODIUM CHLORIDE 0.9 % IJ SOLN
INTRAMUSCULAR | Status: AC
  Filled 2015-03-21: qty 10

## 2015-03-21 MED ORDER — FENTANYL CITRATE (PF) 100 MCG/2ML IJ SOLN
INTRAMUSCULAR | Status: DC | PRN
  Administered 2015-03-21: 100 ug via INTRAVENOUS
  Administered 2015-03-21 (×2): 50 ug via INTRAVENOUS
  Administered 2015-03-21: 100 ug via INTRAVENOUS

## 2015-03-21 MED ORDER — EPHEDRINE SULFATE 50 MG/ML IJ SOLN
INTRAMUSCULAR | Status: AC
  Filled 2015-03-21: qty 1

## 2015-03-21 MED ORDER — DICYCLOMINE HCL 20 MG PO TABS
20.0000 mg | ORAL_TABLET | Freq: Four times a day (QID) | ORAL | Status: DC
  Administered 2015-03-22 – 2015-03-25 (×14): 20 mg via ORAL
  Filled 2015-03-21 (×20): qty 1

## 2015-03-21 MED ORDER — ROCURONIUM BROMIDE 100 MG/10ML IV SOLN
INTRAVENOUS | Status: DC | PRN
  Administered 2015-03-21: 40 mg via INTRAVENOUS

## 2015-03-21 MED ORDER — MECLIZINE HCL 25 MG PO TABS: 25.0000 mg | ORAL_TABLET | Freq: Three times a day (TID) | ORAL | Status: DC | PRN

## 2015-03-21 MED ORDER — SUGAMMADEX SODIUM 200 MG/2ML IV SOLN
INTRAVENOUS | Status: DC | PRN
  Administered 2015-03-21: 180 mg via INTRAVENOUS

## 2015-03-21 MED ORDER — TRAZODONE HCL 50 MG PO TABS
75.0000 mg | ORAL_TABLET | Freq: Every day | ORAL | Status: DC
  Administered 2015-03-22 – 2015-03-24 (×3): 75 mg via ORAL
  Filled 2015-03-21 (×5): qty 1

## 2015-03-21 MED ORDER — METOCLOPRAMIDE HCL 5 MG/ML IJ SOLN
10.0000 mg | Freq: Four times a day (QID) | INTRAMUSCULAR | Status: DC
  Administered 2015-03-21 – 2015-03-23 (×8): 10 mg via INTRAVENOUS
  Filled 2015-03-21 (×12): qty 2

## 2015-03-21 MED ORDER — ACETAMINOPHEN 500 MG PO TABS
1000.0000 mg | ORAL_TABLET | Freq: Four times a day (QID) | ORAL | Status: DC
  Administered 2015-03-22 – 2015-03-23 (×4): 1000 mg via ORAL
  Filled 2015-03-21 (×8): qty 2

## 2015-03-21 MED ORDER — NALOXONE HCL 0.4 MG/ML IJ SOLN: 0.4000 mg | INTRAMUSCULAR | Status: DC | PRN

## 2015-03-21 MED ORDER — BISACODYL 5 MG PO TBEC
10.0000 mg | DELAYED_RELEASE_TABLET | Freq: Every day | ORAL | Status: DC
  Administered 2015-03-22: 10 mg via ORAL
  Filled 2015-03-21: qty 2

## 2015-03-21 MED ORDER — ONDANSETRON HCL 4 MG/2ML IJ SOLN: 4.0000 mg | Freq: Once | INTRAMUSCULAR | Status: DC | PRN

## 2015-03-21 MED ORDER — MIDAZOLAM HCL 5 MG/5ML IJ SOLN
INTRAMUSCULAR | Status: DC | PRN
Start: 2015-03-21 — End: 2015-03-21
  Administered 2015-03-21: 2 mg via INTRAVENOUS

## 2015-03-21 MED ORDER — LACTATED RINGERS IV SOLN
INTRAVENOUS | Status: DC
  Administered 2015-03-21 – 2015-03-22 (×2): via INTRAVENOUS

## 2015-03-21 MED ORDER — SENNOSIDES-DOCUSATE SODIUM 8.6-50 MG PO TABS
1.0000 | ORAL_TABLET | Freq: Every day | ORAL | Status: DC
Start: 2015-03-21 — End: 2015-03-25
  Administered 2015-03-22 – 2015-03-23 (×2): 1 via ORAL
  Filled 2015-03-21 (×5): qty 1

## 2015-03-21 MED ORDER — ESTROGENS CONJUGATED 0.625 MG PO TABS
0.6250 mg | ORAL_TABLET | Freq: Every day | ORAL | Status: DC
  Administered 2015-03-22 – 2015-03-25 (×4): 0.625 mg via ORAL
  Filled 2015-03-21 (×4): qty 1

## 2015-03-21 MED ORDER — ALBUTEROL SULFATE (2.5 MG/3ML) 0.083% IN NEBU: 2.5000 mg | INHALATION_SOLUTION | Freq: Four times a day (QID) | RESPIRATORY_TRACT | Status: DC | PRN

## 2015-03-21 MED ORDER — PHENYLEPHRINE HCL 10 MG/ML IJ SOLN
INTRAMUSCULAR | Status: DC | PRN
  Administered 2015-03-21: 80 ug via INTRAVENOUS
  Administered 2015-03-21: 120 ug via INTRAVENOUS

## 2015-03-21 MED ORDER — TRAMADOL HCL 50 MG PO TABS: 50.0000 mg | ORAL_TABLET | Freq: Four times a day (QID) | ORAL | Status: DC | PRN

## 2015-03-21 MED ORDER — LIDOCAINE HCL (CARDIAC) 20 MG/ML IV SOLN
INTRAVENOUS | Status: AC
Start: 2015-03-21 — End: 2015-03-21
  Filled 2015-03-21: qty 5

## 2015-03-21 MED ORDER — ROCURONIUM BROMIDE 50 MG/5ML IV SOLN
INTRAVENOUS | Status: AC
  Filled 2015-03-21: qty 1

## 2015-03-21 MED ORDER — LEVOTHYROXINE SODIUM 200 MCG PO TABS
200.0000 ug | ORAL_TABLET | Freq: Every day | ORAL | Status: DC
  Administered 2015-03-22 – 2015-03-25 (×4): 200 ug via ORAL
  Filled 2015-03-21 (×6): qty 1

## 2015-03-21 MED ORDER — MOMETASONE FURO-FORMOTEROL FUM 100-5 MCG/ACT IN AERO
2.0000 | INHALATION_SPRAY | Freq: Two times a day (BID) | RESPIRATORY_TRACT | Status: DC
  Administered 2015-03-21 – 2015-03-25 (×8): 2 via RESPIRATORY_TRACT
  Filled 2015-03-21: qty 8.8

## 2015-03-21 SURGICAL SUPPLY — 62 items
CANISTER SUCTION 2500CC (MISCELLANEOUS) ×3 IMPLANT
CATH KIT ON Q 5IN SLV (PAIN MANAGEMENT) IMPLANT
CATH THORACIC 28FR (CATHETERS) ×3 IMPLANT
CATH THORACIC 36FR (CATHETERS) IMPLANT
CATH THORACIC 36FR RT ANG (CATHETERS) IMPLANT
CATH TROCAR 28FR (CATHETERS) ×3 IMPLANT
CONT SPEC 4OZ CLIKSEAL STRL BL (MISCELLANEOUS) ×6 IMPLANT
COVER SURGICAL LIGHT HANDLE (MISCELLANEOUS) ×3 IMPLANT
CUTTER ECHEON FLEX ENDO 45 340 (ENDOMECHANICALS) ×3 IMPLANT
DERMABOND ADVANCED (GAUZE/BANDAGES/DRESSINGS) ×2
DERMABOND ADVANCED .7 DNX12 (GAUZE/BANDAGES/DRESSINGS) ×1 IMPLANT
DRAPE CAMERA VIDEO/LASER (DRAPES) IMPLANT
DRAPE LAPAROSCOPIC ABDOMINAL (DRAPES) ×3 IMPLANT
DRAPE WARM FLUID 44X44 (DRAPE) ×3 IMPLANT
ELECT BLADE 4.0 EZ CLEAN MEGAD (MISCELLANEOUS) ×3
ELECT REM PT RETURN 9FT ADLT (ELECTROSURGICAL) ×3
ELECTRODE BLDE 4.0 EZ CLN MEGD (MISCELLANEOUS) ×1 IMPLANT
ELECTRODE REM PT RTRN 9FT ADLT (ELECTROSURGICAL) ×1 IMPLANT
GAUZE SPONGE 4X4 12PLY STRL (GAUZE/BANDAGES/DRESSINGS) ×3 IMPLANT
GLOVE EUDERMIC 7 POWDERFREE (GLOVE) IMPLANT
GOWN STRL REUS W/ TWL LRG LVL3 (GOWN DISPOSABLE) ×1 IMPLANT
GOWN STRL REUS W/ TWL XL LVL3 (GOWN DISPOSABLE) ×3 IMPLANT
GOWN STRL REUS W/TWL LRG LVL3 (GOWN DISPOSABLE) ×2
GOWN STRL REUS W/TWL XL LVL3 (GOWN DISPOSABLE) ×6
KIT BASIN OR (CUSTOM PROCEDURE TRAY) ×3 IMPLANT
KIT ROOM TURNOVER OR (KITS) ×3 IMPLANT
NS IRRIG 1000ML POUR BTL (IV SOLUTION) ×6 IMPLANT
PACK CHEST (CUSTOM PROCEDURE TRAY) ×3 IMPLANT
PAD ARMBOARD 7.5X6 YLW CONV (MISCELLANEOUS) ×9 IMPLANT
POUCH SPECIMEN RETRIEVAL 10MM (ENDOMECHANICALS) ×3 IMPLANT
RELOAD GREEN ECHELON 45 (STAPLE) ×15 IMPLANT
SEALANT SURG COSEAL 4ML (VASCULAR PRODUCTS) IMPLANT
SEALANT SURG COSEAL 8ML (VASCULAR PRODUCTS) IMPLANT
SOLUTION ANTI FOG 6CC (MISCELLANEOUS) IMPLANT
SPECIMEN JAR MEDIUM (MISCELLANEOUS) IMPLANT
SPONGE GAUZE 4X4 12PLY STER LF (GAUZE/BANDAGES/DRESSINGS) ×3 IMPLANT
SUT PROLENE 3 0 SH DA (SUTURE) IMPLANT
SUT PROLENE 4 0 RB 1 (SUTURE)
SUT PROLENE 4-0 RB1 .5 CRCL 36 (SUTURE) IMPLANT
SUT SILK  1 MH (SUTURE)
SUT SILK 1 MH (SUTURE) IMPLANT
SUT SILK 2 0SH CR/8 30 (SUTURE) IMPLANT
SUT VIC AB 1 CTX 36 (SUTURE)
SUT VIC AB 1 CTX36XBRD ANBCTR (SUTURE) IMPLANT
SUT VIC AB 2 TP1 27 (SUTURE) IMPLANT
SUT VIC AB 2-0 CT1 27 (SUTURE)
SUT VIC AB 2-0 CT1 TAPERPNT 27 (SUTURE) IMPLANT
SUT VIC AB 2-0 CTX 36 (SUTURE) IMPLANT
SUT VIC AB 2-0 UR6 27 (SUTURE) ×3 IMPLANT
SUT VIC AB 3-0 MH 27 (SUTURE) IMPLANT
SUT VIC AB 3-0 SH 27 (SUTURE)
SUT VIC AB 3-0 SH 27X BRD (SUTURE) IMPLANT
SUT VIC AB 3-0 X1 27 (SUTURE) IMPLANT
SYSTEM SAHARA CHEST DRAIN ATS (WOUND CARE) ×3 IMPLANT
TAPE CLOTH SURG 4X10 WHT LF (GAUZE/BANDAGES/DRESSINGS) ×3 IMPLANT
TIP APPLICATOR SPRAY EXTEND 16 (VASCULAR PRODUCTS) IMPLANT
TOWEL OR 17X24 6PK STRL BLUE (TOWEL DISPOSABLE) ×3 IMPLANT
TOWEL OR 17X26 10 PK STRL BLUE (TOWEL DISPOSABLE) ×3 IMPLANT
TRAP SPECIMEN MUCOUS 40CC (MISCELLANEOUS) IMPLANT
TRAY FOLEY BAG SILVER LF 16FR (SET/KITS/TRAYS/PACK) ×3 IMPLANT
TRAY FOLEY CATH 14FRSI W/METER (CATHETERS) IMPLANT
WATER STERILE IRR 1000ML POUR (IV SOLUTION) ×3 IMPLANT

## 2015-03-21 NOTE — Op Note (Signed)
CARDIOTHORACIC SURGERY OPERATIVE NOTE:  Joann [name] [id number] 03/21/2015   Preoperative Dx:  Interstitial lung disease  Postoperative Dx: same   Procedure: Right video-assisted thoracoscopy with wedge biopsy of the upper and lower lobes  Surgeon: Dr. Gaye Pollack   Assistant: Lars Pinks, PA-C  Anesthesia: GET   Clinical History:   The patient is a 54 year old nonsmoker with a past medical history of diabetes, fibromyalgia, IBS, interstitial cystitis, hypothyroidism, depression, asthma, and sleep apnea who reports a long history of shortness of breath. She was recently admitted with shortness of breath and a high resolution chest CT showed changes of interstitial fibrosis. She has never smoked but her parents were heavy smokers and her mother died of lung cancer. Her father had rheumatoid arthritis and rheumatoid lung disease and COPD and recently expired. She had a long exposure to a wood stove growing up. 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 She has shortness of breath with minimal activity. During her most recent admission she had an immunological workup started.  Operative procedure:   The patient was seen in the preoperative holding area. The proper patient, proper operative side, proper operation were confirmed after reviewing his history and chest x-ray. The right side of the chest was signed by me. Preoperative intravenous antibiotics were given. She was taken back to the operating room and placed on table in the supine position. After induction of general endotracheal anesthesia using a double-lumen tube, a Foley catheter was placed in the bladder using sterile technique. An arterial line and central line were placed by anesthesia. Lower extremity sequential compression devices were used. The patient was positioned in the left lateral decubitus position with the right side up. The right side of the chest was prepped  with CHG and draped in the usual sterile manner. Latex allergy precautions were taken. A timeout was taken and the proper patient, proper operation, and proper operative side were confirmed with nursing and anesthesia staff. A 1 cm incision was made in the midaxillary line at about the eighth intercostal space. The right pleural space was entered bluntly with a hemostat and an 8 mm trocar was inserted. The 30 thoracoscope was inserted and the pleural space inspected. There was severe interstitial lung disease with a cobblestone appearance to the lungs. The lung was very firm and noncompliant. A second 1 cm incision was made in the posterior axillary line at the fifth intercostal space for insertion of instruments. A third incision was placed in the anterior axillary line at the 4th ICS. She dropped her oxygen sats to the mid 80's % on one lung ventilation and we had to ventilate the right lung periodically during the procedure. Exposure was difficult due to the limited exposure in the pleural space since the lungs were noncompliant and did not collapse. The edge of the right upper lobe was grasped with a clamp and a wedge biopsy taken using a surgical stapler. Then the edge of the lower lobe was grasped and a wedge biopsy taken using a surgical stapler.    Then a 100 French chest tube was placed through the mid-axillary incision and advanced to the apex. This was fixed to the skin with silk sutures. The lung was reinflated under direct vision and I did not see any significant air leak. Hemostasis appeared adequate. The posterior and anterior axillary incisions were then closed in layers using a 2-0 Vicryl subcutaneous suture  and 3-0 Vicryl subcuticular skin closure.  The chest tube was connected to Pleur-evac suction. The sponge needle and instrument counts were correct according to the scrub nurse. The patient was then turned into the supine position, extubated, and transported to the post anesthesia care unit in  satisfactory and stable condition.

## 2015-03-21 NOTE — Progress Notes (Signed)
CT surgery p.m. Rounds  Patient resting after right VATS, lung biopsy for interstitial lung disease O2 saturations 95% No air leak from tubes Pain adequately controlled with PCA

## 2015-03-21 NOTE — Anesthesia Procedure Notes (Signed)
Procedure Name: Intubation Performed by: Gean Maidens Pre-anesthesia Checklist: Patient identified, Emergency Drugs available, Suction available, Timeout performed and Patient being monitored Patient Re-evaluated:Patient Re-evaluated prior to inductionOxygen Delivery Method: Circle system utilized Preoxygenation: Pre-oxygenation with 100% oxygen Intubation Type: IV induction Ventilation: Oral airway inserted - appropriate to patient size and Mask ventilation without difficulty Laryngoscope Size: Glidescope Grade View: Grade II Tube type: Oral Endobronchial tube: Left, Double lumen EBT, EBT position confirmed by auscultation and EBT position confirmed by fiberoptic bronchoscope and 35 Fr Number of attempts: 2 (see quick note for single-lumen to doulbe lumen conversion notes) Airway Equipment and Method: Stylet,  Fiberoptic brochoscope and Video-laryngoscopy Placement Confirmation: breath sounds checked- equal and bilateral,  positive ETCO2 and CO2 detector Tube secured with: Tape Dental Injury: Teeth and Oropharynx as per pre-operative assessment  Difficulty Due To: Difficulty was anticipated and Difficult Airway- due to anterior larynx Future Recommendations: Recommend- induction with short-acting agent, and alternative techniques readily available

## 2015-03-21 NOTE — Telephone Encounter (Signed)
Joann [name]  patoient [name] had lung bx 03/21/2015. Please give her fu in 3 weeks with ME  Thanks  Dr. Brand Males, M.D., [facility].C.P Pulmonary and Critical Care Medicine Staff Physician [facility] Pager: [phone], If no answer or between  15:00h - 7:00h: call [phone]  03/21/2015 1:00 PM   g

## 2015-03-21 NOTE — Progress Notes (Signed)
Utilization Review Completed.Joann [name] T5/10/2015  

## 2015-03-21 NOTE — Interval H&P Note (Signed)
History and Physical Interval Note:  03/21/2015 7:26 AM  Joann [name]  has presented today for surgery, with the diagnosis of ILD  The various methods of treatment have been discussed with the patient and family. After consideration of risks, benefits and other options for treatment, the patient has consented to  Procedure(s): VIDEO ASSISTED THORACOSCOPY (Right) LUNG BIOPSY (Right) as a surgical intervention .  The patient's history has been reviewed, patient examined, no change in status, stable for surgery.  I have reviewed the patient's chart and labs.  Questions were answered to the patient's satisfaction.     Gaye Pollack

## 2015-03-21 NOTE — Brief Op Note (Signed)
03/21/2015  10:20 AM  PATIENT:  [name]  54 y.o. female  PRE-OPERATIVE DIAGNOSIS:  Interstitial Lung Disease  POST-OPERATIVE DIAGNOSIS:  Interstitial Lung Disease  PROCEDURE:  Procedure(s): VIDEO ASSISTED THORACOSCOPY (Right) Right Upper and Lower Lobe Lung Wedge BIOPSY (Right)  SURGEON:  Surgeon(s) and Role:    * Gaye Pollack, MD - Primary  PHYSICIAN ASSISTANT: Lars Pinks, PA-C    ANESTHESIA:   general  EBL:  Total I/O In: 1000 [I.V.:1000] Out: 56 [Blood:50]  BLOOD ADMINISTERED:none  DRAINS: 1 26F Chest Tube(s) in the right pleural space   LOCAL MEDICATIONS USED:  NONE  SPECIMEN:  Source of Specimen:  wedge biopsy of right upper and lower lobe  DISPOSITION OF SPECIMEN:  PATHOLOGY  COUNTS:  YES  TOURNIQUET:  * No tourniquets in log *  DICTATION: .Note written in EPIC  PLAN OF CARE: Admit to inpatient   PATIENT DISPOSITION:  PACU - hemodynamically stable.   Delay start of Pharmacological VTE agent (>24hrs) due to surgical blood loss or risk of bleeding: yes

## 2015-03-21 NOTE — Anesthesia Preprocedure Evaluation (Signed)
Anesthesia Evaluation  Patient identified by MRN, date of birth, ID band Patient awake    Reviewed: Allergy & Precautions, NPO status , Patient's Chart, lab work & pertinent test results  Airway Mallampati: III  TM Distance: >3 FB Neck ROM: Full    Dental  (+) Teeth Intact, Dental Advisory Given   Pulmonary  breath sounds clear to auscultation        Cardiovascular hypertension, Rhythm:Regular Rate:Normal     Neuro/Psych    GI/Hepatic   Endo/Other  diabetes  Renal/GU      Musculoskeletal   Abdominal   Peds  Hematology   Anesthesia Other Findings   Reproductive/Obstetrics                             Anesthesia Physical Anesthesia Plan  ASA: III  Anesthesia Plan: General   Post-op Pain Management:    Induction: Intravenous  Airway Management Planned: Double Lumen EBT  Additional Equipment: Arterial line and CVP  Intra-op Plan:   Post-operative Plan: Possible Post-op intubation/ventilation  Informed Consent: I have reviewed the patients History and Physical, chart, labs and discussed the procedure including the risks, benefits and alternatives for the proposed anesthesia with the patient or authorized representative who has indicated his/her understanding and acceptance.   Dental advisory given  Plan Discussed with: CRNA and Anesthesiologist  Anesthesia Plan Comments: (Pulmonary fibrosis Anxiety  H/O renal cell cancer  Plan GA with double lumen ETT  Roberts Gaudy  )        Anesthesia Quick Evaluation

## 2015-03-21 NOTE — Anesthesia Postprocedure Evaluation (Signed)
  Anesthesia Post-op Note  Patient: Joann [name]  Procedure(s) Performed: Procedure(s): VIDEO ASSISTED THORACOSCOPY (Right) Right Upper and Lower Lobe Lung Wedge BIOPSY (Right)  Patient Location: PACU  Anesthesia Type:General  Level of Consciousness: awake, alert  and oriented  Airway and Oxygen Therapy: Patient Spontanous Breathing and Patient connected to nasal cannula oxygen  Post-op Pain: mild  Post-op Assessment: Post-op Vital signs reviewed, Patient's Cardiovascular Status Stable, Respiratory Function Stable, Patent Airway and Pain level controlled  Post-op Vital Signs: stable  Last Vitals:  Filed Vitals:   03/21/15 1400  BP: 118/72  Pulse: 104  Temp:   Resp:     Complications: No apparent anesthesia complications

## 2015-03-21 NOTE — Telephone Encounter (Signed)
lmtcb for pt.  

## 2015-03-21 NOTE — Transfer of Care (Signed)
Immediate Anesthesia Transfer of Care Note  Patient: Joann [name]  Procedure(s) Performed: Procedure(s): VIDEO ASSISTED THORACOSCOPY (Right) Right Upper and Lower Lobe Lung Wedge BIOPSY (Right)  Patient Location: PACU  Anesthesia Type:General  Level of Consciousness: awake, patient cooperative and responds to stimulation  Airway & Oxygen Therapy: Patient Spontanous Breathing and Patient connected to face mask oxygen  Post-op Assessment: Report given to RN and Post -op Vital signs reviewed and stable  Post vital signs: Reviewed and stable  Last Vitals:  Filed Vitals:   03/21/15 0554  BP: 126/67  Pulse: 87  Temp: 79.4 C    Complications: No apparent anesthesia complications

## 2015-03-21 NOTE — Progress Notes (Signed)
Notified Dr Cyndia Bent of increased need for 02; pt  Now on 50% venti mask and sats in the 90s

## 2015-03-21 NOTE — H&P (Signed)
[address]       [place] [address]             [phone]      Cardiothoracic Surgery History and Physical   PCP is Velna Hatchet, MD Referring Provider is Brand Males, MD  Chief Complaint  Patient presents with  . Interstitial Lung Disease    Surgical eval, PFT's 03/13/15, Chest CT 02/28/15    HPI:  The patient is a 54 year old nonsmoker with a past medical history of diabetes, fibromyalgia, IBS, interstitial cystitis, hypothyroidism, depression, asthma, and sleep apnea who reports a long history of shortness of breath. She was recently admitted with shortness of breath and a high resolution chest CT showed changes of interstitial fibrosis. She has never smoked but her parents were heavy smokers and her mother died of lung cancer. Her father had rheumatoid arthritis and rheumatoid lung disease and COPD and recently expired. She had a long exposure to a wood stove growing up. 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 She has shortness of breath with minimal activity. During her most recent admission she had an immunological workup started.  Past Medical History  Diagnosis Date  . Asthma   . Diabetes mellitus   . Headache(784.0)     MIGRAINES  . Fibromyalgia   . GERD (gastroesophageal reflux disease)   . IBS (irritable bowel syndrome)   . Renal mass, right   . IC (interstitial cystitis)   . Frequency of urination   . Anemia   . Swelling of both lower extremities     TAKES MAXIDE FOR SWELLING (DENIES HBP)  . Hypothyroidism   . Depression   . Cancer     kidney  . Hypertension   . Wears glasses   . Sleep apnea     uses a cpap    Past Surgical History  Procedure Laterality Date  . Cesarean section  1983, 1985    x 2  . Bladder tack    . Cystocopy    . Cystoscopy    . Robotic assited  partial nephrectomy Right 12/14/2013    Procedure: ROBOTIC ASSITED PARTIAL NEPHRECTOMY; Surgeon: Dutch Gray, MD; Location: [facility]; Service: Urology; Laterality: Right;  . Cystoscopy w/ ureteral stent placement Right 12/14/2013    Procedure: CYSTOSCOPY WITH RETROGRADE PYELOGRAM/URETERAL STENT PLACEMENT; Surgeon: Dutch Gray, MD; Location: [facility]; Service: Urology; Laterality: Right;  . Cervical laminectomy  07/2012    anterior with plates and screws.  . Abdominal hysterectomy  1996  . Cholecystectomy  1995    tubal ligation also  . Posterior cervical laminectomy  2004  . Tubal ligation    . Colonoscopy    . Incisional hernia repair N/A 09/12/2014    Procedure: HERNIA REPAIR INCISIONAL; Surgeon: Coralie Keens, MD; Location: [facility]; Service: General; Laterality: N/A;  . Insertion of mesh N/A 09/12/2014    Procedure: INSERTION OF MESH; Surgeon: Coralie Keens, MD; Location: [facility]; Service: General; Laterality: N/A;    Family History  Problem Relation Age of Onset  . Cancer Mother     breast, lung  . COPD Father   . Hypertension Father   . Hyperlipidemia Father   . Stroke Father     Social History History  Substance Use Topics  . Smoking status: Never Smoker   . Smokeless tobacco: Not on file  . Alcohol Use:  Yes     Comment: occasional    Current Outpatient Prescriptions  Medication Sig Dispense Refill  . albuterol (PROVENTIL HFA;VENTOLIN HFA) 108 (90 BASE) MCG/ACT inhaler Inhale 2 puffs into the lungs every 6 (six) hours as needed. For shortness of breath    . ALPRAZolam (XANAX) 0.5 MG tablet Take 0.5 mg by mouth daily as needed for anxiety.     Marland Kitchen amLODipine (NORVASC) 5 MG tablet Take 5 mg by mouth daily.    . cholecalciferol (VITAMIN D) 1000 UNITS tablet Take 1,000 Units by mouth daily.    Marland Kitchen  dicyclomine (BENTYL) 20 MG tablet Take 20 mg by mouth every 6 (six) hours.    . DULoxetine (CYMBALTA) 60 MG capsule Take 60 mg by mouth daily.    Marland Kitchen esomeprazole (NEXIUM) 40 MG capsule Take 40 mg by mouth 2 (two) times daily before a meal.     . estrogens, conjugated, (PREMARIN) 0.625 MG tablet Take 0.625 mg by mouth daily.     . Fluticasone-Salmeterol (ADVAIR) 100-50 MCG/DOSE AEPB Inhale 1 puff into the lungs as needed.     . indomethacin (INDOCIN) 25 MG capsule Take 25 mg by mouth 2 (two) times daily with a meal.    . insulin aspart (NOVOLOG) 100 UNIT/ML injection Inject 15 Units into the skin daily as needed for high blood sugar (with largestmeal if sugar is over 200).    . insulin glargine (LANTUS) 100 UNIT/ML injection Inject 50 Units into the skin at bedtime.     Marland Kitchen levothyroxine (SYNTHROID, LEVOTHROID) 200 MCG tablet Take 200 mcg by mouth daily before breakfast.    . meclizine (ANTIVERT) 25 MG tablet Take 25 mg by mouth 3 (three) times daily as needed for dizziness.    . metFORMIN (GLUCOPHAGE-XR) 500 MG 24 hr tablet Take 1,000 mg by mouth 2 (two) times daily.    . ondansetron (ZOFRAN) 4 MG tablet Take 4 mg by mouth every 8 (eight) hours as needed for nausea or vomiting.    . topiramate (TOPAMAX) 25 MG tablet Take 25 mg by mouth 2 (two) times daily.    . traZODone (DESYREL) 150 MG tablet Take 75 mg by mouth at bedtime.    . vitamin E 400 UNIT capsule Take 800 Units by mouth daily.     No current facility-administered medications for this visit.    Allergies  Allergen Reactions  . Peanut-Containing Drug Products Shortness Of Breath    Pecans, walnuts as well  . Sulfa Antibiotics Hives and Shortness Of Breath  . Betadine [Povidone Iodine] Rash  . Latex Rash    Review of Systems  Constitutional: Positive for activity change, appetite change and fatigue. Negative for fever, chills and  unexpected weight change.  HENT:   Loss of smell about 4 years ago  Eyes: Negative.  Respiratory: Positive for cough, chest tightness and shortness of breath.  Cardiovascular: Positive for palpitations. Negative for chest pain and leg swelling.  Gastrointestinal: Negative.  Endocrine: Negative.  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  Skin: Negative.  Allergic/Immunologic: Negative.  Neurological: Positive for headaches.  Hematological: Negative.  Psychiatric/Behavioral:   Depression    BP 120/79 mmHg  Pulse 110  Resp 20  Ht 4\' 11"  (1.499 m)  Wt 186 lb (84.369 kg)  BMI 37.55 kg/m2  SpO2 91% Physical Exam   Diagnostic Tests:  CLINICAL DATA: 54 year old female with shortness of breath and chest pain since yesterday evening. Intermittent tachycardia over the  past several weeks, particularly during exertion.  EXAM: CT CHEST WITHOUT CONTRAST  TECHNIQUE: Multidetector CT imaging of the chest was performed following the standard protocol without intravenous contrast. High resolution imaging of the lungs, as well as inspiratory and expiratory imaging, was performed.  COMPARISON: Chest CT 02/28/2015.  FINDINGS: Mediastinum/Lymph Nodes: Heart size is size is normal. There is no significant pericardial fluid, thickening or pericardial calcification. Multiple borderline enlarged and mildly enlarged mediastinal and hilar lymph nodes are noted, largest of which measures up to 15 mm in short axis in the subcarinal nodal station. Esophagus is unremarkable in appearance. No axillary lymphadenopathy.  Lungs/Pleura: High-resolution images demonstrate extensive areas of peripheral subpleural and peribronchovascular reticulation, with marked thickening of the peribronchovascular interstitium throughout the lungs bilaterally. This is associated with areas of cylindrical and mild varicose  bronchiectasis. Scattered areas of ground-glass attenuation and scattered areas of mild honeycombing are also noted. No definitive craniocaudal gradient. Inspiratory and expiratory imaging is unremarkable. No acute consolidative airspace disease. No pleural effusions.  Upper Abdomen: Diffuse low attenuation throughout the hepatic parenchyma, compatible with hepatic steatosis. Status post cholecystectomy.  Musculoskeletal/Soft Tissues: There are no aggressive appearing lytic or blastic lesions noted in the visualized portions of the skeleton.  IMPRESSION: 1. The appearance of the lungs is compatible with underlying interstitial lung disease, and despite the lack of a clear cut craniocaudal gradient, the overall imaging findings are most suggestive of usual interstitial pneumonia. Conceivably, these findings could also be seen in setting of severe fibrotic phase nonspecific interstitial pneumonia (NSIP), however, that is not strongly favored. Repeat high-resolution chest CT might be useful in 12 months to assess for temporal changes in the appearance of the lung parenchyma if clinically appropriate. 2. Mediastinal and bilateral hilar lymphadenopathy noted, likely related to underlying interstitial lung disease. Attention at time of follow-up imaging is recommended. 3. Hepatic steatosis.   Electronically Signed  By: Vinnie Langton M.D.  On: 02/28/2015 14:39   Impression:  She has worsening shortness of breath and diffuse changes of interstitial lung disease on CT. I think a VATS lung biopsy is indicated to try to pin down a diagnosis for treatment and prognostic purposes. I discussed the procedure of VATS lung biopsy with her including alternatives, benefits and risks including but not limited to bleeding, infection, prolonged air leak and respiratory failure. She understands and agrees to proceed.   Plan:  Right VATS for lung biopsy    Gaye Pollack, MD [facility] [phone]

## 2015-03-22 ENCOUNTER — Inpatient Hospital Stay (HOSPITAL_COMMUNITY): Payer: 59

## 2015-03-22 LAB — BASIC METABOLIC PANEL
ANION GAP: 10 (ref 5–15)
BUN: 10 mg/dL (ref 6–20)
CO2: 26 mmol/L (ref 22–32)
Calcium: 8.2 mg/dL — ABNORMAL LOW (ref 8.9–10.3)
Chloride: 99 mmol/L — ABNORMAL LOW (ref 101–111)
Creatinine, Ser: 0.98 mg/dL (ref 0.44–1.00)
GFR calc non Af Amer: >60 mL/min (ref >60)
GLUCOSE: 141 mg/dL — AB (ref 65–99)
POTASSIUM: 3.7 mmol/L (ref 3.5–5.1)
Sodium: 135 mmol/L (ref 135–145)

## 2015-03-22 LAB — GLUCOSE, CAPILLARY
GLUCOSE-CAPILLARY: 112 mg/dL — AB (ref 65–99)
GLUCOSE-CAPILLARY: 122 mg/dL — AB (ref 65–99)
Glucose-Capillary: 98 mg/dL (ref 65–99)
Glucose-Capillary: 130 mg/dL — ABNORMAL HIGH (ref 65–99)
Glucose-Capillary: 134 mg/dL — ABNORMAL HIGH (ref 65–99)
Glucose-Capillary: 106 mg/dL — ABNORMAL HIGH (ref 65–99)

## 2015-03-22 LAB — POCT I-STAT 3, ART BLOOD GAS (G3+)
ACID-BASE DEFICIT: 1 mmol/L (ref 0.0–2.0)
BICARBONATE: 26.1 meq/L — AB (ref 20.0–24.0)
O2 SAT: 96 %
PO2 ART: 96 mmHg (ref 80.0–100.0)
Patient temperature: 99.3
TCO2: 28 mmol/L (ref 0–100)
pCO2 arterial: 52.5 mmHg — ABNORMAL HIGH (ref 35.0–45.0)
pH, Arterial: 7.306 — ABNORMAL LOW (ref 7.350–7.450)

## 2015-03-22 LAB — CBC
HEMATOCRIT: 30.5 % — AB (ref 36.0–46.0)
Hemoglobin: 9.6 g/dL — ABNORMAL LOW (ref 12.0–15.0)
MCH: 27.6 pg (ref 26.0–34.0)
MCHC: 31.5 g/dL (ref 30.0–36.0)
MCV: 87.6 fL (ref 78.0–100.0)
Platelets: 289 10*3/uL (ref 150–400)
RBC: 3.48 MIL/uL — AB (ref 3.87–5.11)
RDW: 14.1 % (ref 11.5–15.5)
WBC: 10.5 10*3/uL (ref 4.0–10.5)

## 2015-03-22 MED ORDER — POTASSIUM CHLORIDE 10 MEQ/50ML IV SOLN
10.0000 meq | INTRAVENOUS | Status: AC
  Administered 2015-03-22 (×3): 10 meq via INTRAVENOUS
  Filled 2015-03-22 (×2): qty 50

## 2015-03-22 MED ORDER — PHENOL 1.4 % MT LIQD: 1.0000 | OROMUCOSAL | Status: DC | PRN

## 2015-03-22 NOTE — Progress Notes (Addendum)
1 Day Post-Op Procedure(s) (LRB): VIDEO ASSISTED THORACOSCOPY (Right) Right Upper and Lower Lobe Lung Wedge BIOPSY (Right) Subjective:  No complaintsw  Objective: Vital signs in last 24 hours: Temp:  [97.3 F (36.3 C)-99.3 F (37.4 C)] 99.3 F (37.4 C) (05/13 0356) Pulse Rate:  [92-108] 94 (05/13 0700) Cardiac Rhythm:  [-] Normal sinus rhythm (05/13 0400) Resp:  [12-28] 20 (05/13 0700) BP: (96-126)/(49-72) 99/63 mmHg (05/13 0700) SpO2:  [84 %-100 %] 97 % (05/13 0700) Arterial Line BP: (98-158)/(45-71) 98/45 mmHg (05/13 0700) FiO2 (%):  [28 %-40 %] 40 % (05/12 2000) Weight:  [84.369 kg (186 lb)] 84.369 kg (186 lb) (05/12 2000)  Hemodynamic parameters for last 24 hours:    Intake/Output from previous day: 05/12 0701 - 05/13 0700 In: 3098.3 [I.V.:2948.3; IV Piggyback:150] Out: 2405 [Urine:2095; Blood:50; Chest Tube:260] Intake/Output this shift:    General appearance: alert and cooperative Neurologic: intact Heart: regular rate and rhythm, S1, S2 normal, no murmur, click, rub or gallop Lungs: clear to auscultation bilaterally no air leak from chest tube  Lab Results:  Recent Labs  03/21/15 0633 03/22/15 0445  WBC 10.0 10.5  HGB 10.6* 9.6*  HCT 33.0* 30.5*  PLT 339 289   BMET:  Recent Labs  03/21/15 0633 03/22/15 0445  NA 135 135  K 4.2 3.7  CL 101 99*  CO2 22 26  GLUCOSE 104* 141*  BUN 19 10  CREATININE 1.25* 0.98  CALCIUM 9.0 8.2*    PT/INR:  Recent Labs  03/21/15 0633  LABPROT 14.1  INR 1.07   ABG    Component Value Date/Time   PHART 7.306* 03/22/2015 0450   HCO3 26.1* 03/22/2015 0450   TCO2 28 03/22/2015 0450   ACIDBASEDEF 1.0 03/22/2015 0450   O2SAT 96.0 03/22/2015 0450   CBG (last 3)   Recent Labs  03/21/15 1912 03/21/15 2330 03/22/15 0338  GLUCAP 108* 101* 98   CXR: ILD tiny right ptx laterally and at apex.  Assessment/Plan: S/P Procedure(s) (LRB): VIDEO ASSISTED THORACOSCOPY (Right) Right Upper and Lower Lobe Lung Wedge  BIOPSY (Right)  She has been hemodynamically stable and FiO2 is down to 6L with sats 98%. Will put chest tube to water seal and plan to remove tomorrow. Mobilize Diabetes control: glucose under good control. Continue SSI.   LOS: 1 day    Joann [name] 03/22/2015

## 2015-03-22 NOTE — Progress Notes (Signed)
This note also relates to the following rows which could not be included: Pulse Rate - Cannot attach notes to unvalidated device data ECG Heart Rate - Cannot attach notes to unvalidated device data SpO2 - Cannot attach notes to unvalidated device data End Tidal CO2 (EtCO2) - Cannot attach notes to unvalidated device data Arterial Line BP - Cannot attach notes to unvalidated device data Arterial Line MAP (mmHg) - Cannot attach notes to unvalidated device data   desated into 70's when getting back to bed. Changed over to 40% simple mask until she recouperates. Then will switch back to 6 L [place]

## 2015-03-22 NOTE — Progress Notes (Signed)
Patient ID: Joann [name], female   DOB: 01/19/1961, 54 y.o.   MRN: [id number]  SICU Evening Rounds:  Hemodynamically stable  Ambulated 20 ft today.   Urine output ok

## 2015-03-23 ENCOUNTER — Inpatient Hospital Stay (HOSPITAL_COMMUNITY): Payer: 59

## 2015-03-23 LAB — GLUCOSE, CAPILLARY
GLUCOSE-CAPILLARY: 145 mg/dL — AB (ref 65–99)
Glucose-Capillary: 144 mg/dL — ABNORMAL HIGH (ref 65–99)
Glucose-Capillary: 160 mg/dL — ABNORMAL HIGH (ref 65–99)

## 2015-03-23 MED ORDER — INSULIN DETEMIR 100 UNIT/ML ~~LOC~~ SOLN
10.0000 [IU] | Freq: Every day | SUBCUTANEOUS | Status: DC
  Administered 2015-03-23 – 2015-03-25 (×3): 10 [IU] via SUBCUTANEOUS
  Filled 2015-03-23 (×3): qty 0.1

## 2015-03-23 MED ORDER — INSULIN ASPART 100 UNIT/ML ~~LOC~~ SOLN
0.0000 [IU] | Freq: Three times a day (TID) | SUBCUTANEOUS | Status: DC
  Administered 2015-03-24: 2 [IU] via SUBCUTANEOUS
  Administered 2015-03-24: 3 [IU] via SUBCUTANEOUS
  Administered 2015-03-24: 2 [IU] via SUBCUTANEOUS
  Administered 2015-03-25: 5 [IU] via SUBCUTANEOUS
  Administered 2015-03-25: 2 [IU] via SUBCUTANEOUS

## 2015-03-23 NOTE — Plan of Care (Signed)
Problem: Phase I Progression Outcomes Goal: Pain controlled with appropriate interventions Outcome: Completed/Met Date Met:  03/23/15 Full dose Fentanyl PCA Goal: Activity tolerated as ordered Outcome: Progressing Still with increased work of breathing and decreased when ambulating/transferring     

## 2015-03-23 NOTE — Progress Notes (Addendum)
22mL of Fentanyl PCA wasted in sink and witnessed by Carver Fila, RN.  Achille Rich, RN 03/23/2015 1545 SMITH, Estil Daft, RN

## 2015-03-24 ENCOUNTER — Inpatient Hospital Stay (HOSPITAL_COMMUNITY): Payer: 59

## 2015-03-24 LAB — GLUCOSE, CAPILLARY
GLUCOSE-CAPILLARY: 154 mg/dL — AB (ref 65–99)
GLUCOSE-CAPILLARY: 146 mg/dL — AB (ref 65–99)
Glucose-Capillary: 127 mg/dL — ABNORMAL HIGH (ref 65–99)

## 2015-03-24 MED ORDER — GUAIFENESIN ER 600 MG PO TB12
600.0000 mg | ORAL_TABLET | Freq: Two times a day (BID) | ORAL | Status: DC
  Administered 2015-03-24 – 2015-03-25 (×3): 600 mg via ORAL
  Filled 2015-03-24 (×4): qty 1

## 2015-03-24 MED ORDER — METFORMIN HCL ER 500 MG PO TB24
500.0000 mg | ORAL_TABLET | Freq: Two times a day (BID) | ORAL | Status: DC
  Administered 2015-03-24 – 2015-03-25 (×3): 500 mg via ORAL
  Filled 2015-03-24 (×4): qty 1

## 2015-03-24 NOTE — Progress Notes (Addendum)
       [address]       [place] [address]             [phone]          3 Days Post-Op Procedure(s) (LRB): VIDEO ASSISTED THORACOSCOPY (Right) Right Upper and Lower Lobe Lung Wedge BIOPSY (Right)  Subjective: Comfortable, still desats with exertion and feeling a little dyspneic with activity.  Coughing up some thick sputum.   Objective: Vital signs in last 24 hours: Patient Vitals for the past 24 hrs:  BP Temp Temp src Pulse Resp SpO2  03/24/15 0438 123/74 mmHg 97.8 F (36.6 C) Oral (!) 106 18 97 %  03/23/15 1954 (!) 113/58 mmHg 99 F (37.2 C) Oral 100 18 98 %  03/23/15 1546 136/78 mmHg 98 F (36.7 C) Oral 97 18 96 %  03/23/15 1509 - 98.1 F (36.7 C) Oral - - -  03/23/15 1500 115/73 mmHg - - (!) 105 (!) 27 95 %  03/23/15 1458 115/73 mmHg - - (!) 101 18 98 %  03/23/15 1400 116/66 mmHg - - 88 - 95 %  03/23/15 1300 (!) 93/59 mmHg - - (!) 107 - 96 %  03/23/15 1200 114/71 mmHg - - 85 - 97 %  03/23/15 1157 - 98.3 F (36.8 C) Oral - - -  03/23/15 1100 126/65 mmHg - - 88 - 97 %  03/23/15 1030 - - - 91 - 94 %  03/23/15 1023 - - - (!) 101 - 90 %  03/23/15 1022 - - - (!) 101 - (!) 81 %  03/23/15 1000 126/69 mmHg - - 87 - 97 %   Current Weight  03/23/15 182 lb 15.7 oz (83 kg)     Intake/Output from previous day: 05/14 0701 - 05/15 0700 In: 520 [P.O.:120; I.V.:400] Out: 1940 [Urine:1900; Chest Tube:40] CBGs 144-160-145   PHYSICAL EXAM:  Heart: RRR Lungs: Few coarse BS bilaterally Wound: Clean and dry     Lab Results: CBC: Recent Labs  03/22/15 0445  WBC 10.5  HGB 9.6*  HCT 30.5*  PLT 289   BMET:  Recent Labs  03/22/15 0445  NA 135  K 3.7  CL 99*  CO2 26  GLUCOSE 141*  BUN 10  CREATININE 0.98  CALCIUM 8.2*    PT/INR: No results for input(s): LABPROT, INR in the last 72 hours.    Assessment/Plan: S/P Procedure(s) (LRB): VIDEO ASSISTED THORACOSCOPY (Right) Right Upper and Lower Lobe Lung Wedge BIOPSY (Right) Still  with increased O2 requirements.  Presently on 5L (was on 2-3 L at home prior to surgery).  Continue aggressive pulm toilet, home inhalers, will add flutter valve and Mucinex for thick secretions.  Wean O2 as able. DM- sugars generally stable on low dose Levemir.  Will slowly resume po meds. Ambulate in halls. Home when pulm status is closer to baseline.   LOS: 3 days    COLLINS,GINA H 03/24/2015   Chart reviewed, patient examined, agree with above. She is doing fairly well overall but requires 5L oxygen with desat while walking. This will improve over time. I suspect she was desating at home while walking but does not check her sats at home. She can go home tomorrow if feeling able. Path still pending and I suspect it will be sent out for 2nd opinion.

## 2015-03-24 NOTE — Discharge Summary (Signed)
[address]       [place] [address]             [phone]              Discharge Summary  Name: Joann [name] DOB: 01/12/1961 54 y.o. MRN: [id number]   Admission Date: 03/21/2015 Discharge Date: 03/25/2015    Admitting Diagnosis: Active Problems:   Interstitial lung disease    Discharge Diagnosis:  Active Problems:   Interstitial lung disease  Past Medical History  Diagnosis Date  . Asthma   . Diabetes mellitus   . Headache(784.0)     MIGRAINES  . Fibromyalgia   . GERD (gastroesophageal reflux disease)   . IBS (irritable bowel syndrome)   . Renal mass, right   . IC (interstitial cystitis)   . Frequency of urination   . Anemia   . Swelling of both lower extremities     TAKES MAXIDE FOR SWELLING (DENIES HBP)  . Hypothyroidism   . Depression   . Cancer     kidney  . Hypertension   . Wears glasses   . Sleep apnea     uses a cpap  . Pulmonary fibrosis   . Pneumonia   . Fatty liver   . Oxygen dependent     2 Liters at rest, 3 liters with activity     Procedures: RIGHT VIDEO ASSISTED THORACOSCOPY -  03/21/2015  Right Upper and Lower Lobe Lung Wedge resections    HPI:  The patient is a 54 y.o. female nonsmoker with a past medical history of diabetes, fibromyalgia, IBS, interstitial cystitis, hypothyroidism, depression, asthma, and sleep apnea who reports a long history of shortness of breath. She was recently admitted with shortness of breath and a high resolution chest CT showed changes of interstitial fibrosis. She has never smoked but her parents were heavy smokers and her mother died of lung cancer. Her father had rheumatoid arthritis and rheumatoid lung disease and COPD and recently expired. She had a long exposure to a wood stove growing up. 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 She has shortness of breath with minimal activity. During her most recent admission she had an  immunological workup started.  It was felt that surgical lung biopsy was indicated for definitive diagnosis.  The patient was referred to Dr. Cyndia Bent, and he recommended a VATS/resection. All risks, benefits and alternatives of surgery were explained in detail, and the patient agreed to proceed.    Hospital Course:  The patient was admitted to [facility] on 03/21/2015.  The patient was taken to the operating room and underwent the above procedure.    The postoperative course has been notable for high oxygen requirements.  She has been treated with aggressive pulmonary toilet measures, and the patient has been restarted on her home medication regimen.  Oxygen is presently being weaned (she was on 2-3 liters prior to surgery).  Chest tubes have been removed in the standard fashion and follow up chest x-rays have remained unremarkable.  Incisions are healing well.  She is tolerating a regular diet and is ambulating in the halls with assistance.  Final pathology is pending.      Recent vital signs:  Filed Vitals:   03/25/15 1410  BP: 126/69  Pulse: 93  Temp: 99.7 F (37.6 C)  Resp: 20    Recent laboratory studies:  CBC:No results for  input(s): WBC, HGB, HCT, PLT in the last 72 hours. BMET: No results for input(s): NA, K, CL, CO2, GLUCOSE, BUN, CREATININE, CALCIUM in the last 72 hours.  PT/INR: No results for input(s): LABPROT, INR in the last 72 hours.    Discharge Medications:     Medication List    TAKE these medications        albuterol 108 (90 BASE) MCG/ACT inhaler  Commonly known as:  PROVENTIL HFA;VENTOLIN HFA  Inhale 2 puffs into the lungs every 6 (six) hours as needed. For shortness of breath     ALPRAZolam 0.5 MG tablet  Commonly known as:  XANAX  Take 0.5 mg by mouth daily as needed for anxiety.     amLODipine 5 MG tablet  Commonly known as:  NORVASC  Take 5 mg by mouth every morning.     cholecalciferol 1000 UNITS tablet  Commonly known as:  VITAMIN D  Take 1,000  Units by mouth daily.     dicyclomine 20 MG tablet  Commonly known as:  BENTYL  Take 20 mg by mouth every 6 (six) hours.     DULoxetine 60 MG capsule  Commonly known as:  CYMBALTA  Take 60 mg by mouth daily.     esomeprazole 40 MG capsule  Commonly known as:  NEXIUM  Take 40 mg by mouth 2 (two) times daily before a meal.     estrogens (conjugated) 0.625 MG tablet  Commonly known as:  PREMARIN  Take 0.625 mg by mouth daily.     Fluticasone-Salmeterol 100-50 MCG/DOSE Aepb  Commonly known as:  ADVAIR  Inhale 1 puff into the lungs as needed.     guaiFENesin 600 MG 12 hr tablet  Commonly known as:  MUCINEX  Take 1 tablet (600 mg total) by mouth 2 (two) times daily.     indomethacin 25 MG capsule  Commonly known as:  INDOCIN  Take 25 mg by mouth 2 (two) times daily with a meal.     insulin aspart 100 UNIT/ML injection  Commonly known as:  novoLOG  Inject 15 Units into the skin daily as needed for high blood sugar (with largestmeal if sugar is over 200).     insulin glargine 100 UNIT/ML injection  Commonly known as:  LANTUS  Inject 50 Units into the skin at bedtime.     levothyroxine 200 MCG tablet  Commonly known as:  SYNTHROID, LEVOTHROID  Take 200 mcg by mouth daily before breakfast.     meclizine 25 MG tablet  Commonly known as:  ANTIVERT  Take 25 mg by mouth 3 (three) times daily as needed for dizziness.     metFORMIN 500 MG 24 hr tablet  Commonly known as:  GLUCOPHAGE-XR  Take 1,000 mg by mouth 2 (two) times daily.     ondansetron 4 MG tablet  Commonly known as:  ZOFRAN  Take 4 mg by mouth every 8 (eight) hours as needed for nausea or vomiting.     oxyCODONE 5 MG immediate release tablet  Commonly known as:  Oxy IR/ROXICODONE  Take 1-2 tablets (5-10 mg total) by mouth every 4 (four) hours as needed for severe pain.     topiramate 25 MG tablet  Commonly known as:  TOPAMAX  Take 25 mg by mouth 2 (two) times daily.     traZODone 150 MG tablet  Commonly  known as:  DESYREL  Take 75 mg by mouth at bedtime.     triamterene-hydrochlorothiazide 37.5-25 MG per tablet  Commonly  known as:  MAXZIDE-25  Take 1 tablet by mouth every evening.     vitamin E 400 UNIT capsule  Take 800 Units by mouth daily.         Discharge Instructions:  The patient is to refrain from driving, heavy lifting or strenuous activity.  May shower daily and clean incisions with soap and water.  May resume regular diet.   Follow Up: Follow-up Information    Follow up with Gaye Pollack, MD.   Specialty:  Cardiothoracic Surgery   Why:  Office will schedule an appointment   Contact information:   [address] [place] [address] [phone]       Follow up with [facility] RN.   Why:  For suture removal - office will contact you with an appointment        Follow-up Information    Follow up with Gaye Pollack, MD.   Specialty:  Cardiothoracic Surgery   Why:  Office will schedule an appointment   Contact information:   [address] [place] [address] [phone]       Follow up with [facility] RN.   Why:  For suture removal - office will contact you with an appointment       Laquetta Racey 03/26/2015, 6:04 AM

## 2015-03-24 NOTE — Progress Notes (Signed)
Removed left internal jugular catheter.  Applied pressure for 5 minutes and petroleum gauze and tegaderm dressing.  Patient instructed to call for any distress or bleeding.  Patient instructed to remain in bed for 30 minutes. Pt resting with call bell within reach.  Will continue to monitor.

## 2015-03-25 ENCOUNTER — Encounter (HOSPITAL_COMMUNITY): Payer: Self-pay | Admitting: Surgery

## 2015-03-25 LAB — GLUCOSE, CAPILLARY
GLUCOSE-CAPILLARY: 130 mg/dL — AB (ref 65–99)
Glucose-Capillary: 127 mg/dL — ABNORMAL HIGH (ref 65–99)
Glucose-Capillary: 240 mg/dL — ABNORMAL HIGH (ref 65–99)
Glucose-Capillary: 136 mg/dL — ABNORMAL HIGH (ref 65–99)

## 2015-03-25 MED ORDER — OXYCODONE HCL 5 MG PO TABS: 5.0000 mg | ORAL_TABLET | ORAL | Status: DC | PRN

## 2015-03-25 MED ORDER — LACTULOSE 10 GM/15ML PO SOLN
20.0000 g | Freq: Once | ORAL | Status: AC
  Administered 2015-03-25: 20 g via ORAL
  Filled 2015-03-25: qty 30

## 2015-03-25 MED ORDER — GUAIFENESIN ER 600 MG PO TB12: 600.0000 mg | ORAL_TABLET | Freq: Two times a day (BID) | ORAL | Status: AC

## 2015-03-25 NOTE — Progress Notes (Signed)
Pt to DC home with husband.  All instructions and prescriptions given and reviewed, all questions answered.

## 2015-03-25 NOTE — Discharge Instructions (Signed)
Video-Assisted Thoracic Surgery °Care After °Refer to this sheet in the next few weeks. These instructions provide you with information on caring for yourself after your procedure. Your caregiver may also give you more specific instructions. Your procedure has been planned according to current medical practices, but problems sometimes occur. Call your caregiver if you have any problems or questions after your procedure. °HOME CARE INSTRUCTIONS  °· Only take over-the-counter or prescription medications as directed. °· Only take pain medications (narcotics) as directed. °· Do not drive until your caregiver approves. Driving while taking narcotics or soon after surgery can be dangerous, so discuss the specific timing with your caregiver. °· Avoid activities that use your chest muscles, such as lifting heavy objects, for at least 3-4 weeks.   °· Take deep breaths to expand the lungs and to protect against pneumonia. °· Do breathing exercises as directed by your caregiver. If you were given an incentive spirometer to help with breathing, use it as directed. °· You may resume a normal diet and activities when you feel you are able to or as directed. °· Do not take a bath until your caregiver says it is OK. Use the shower instead.   °· Keep the bandage (dressing) covering the area where the chest tube was inserted (incision site) dry for 48 hours. After 48 hours, remove the dressing unless there is new drainage. °· Remove dressings as directed by your caregiver. °· Change dressings if necessary or as directed. °· Keep all follow-up appointments. It is important for you to see your caregiver after surgery to discuss appropriate follow-up care and surveillance, if it is necessary. °SEEK MEDICAL CARE: °· You feel excessive or increasing pain at an incision site. °· You notice bleeding, skin irritation, drainage, swelling, or redness at an incision site. °· There is a bad smell coming from an incision or dressing. °· It feels  like your heart is fluttering or beating rapidly. °· Your pain medication does not relieve your pain. °SEEK IMMEDIATE MEDICAL CARE IF:  °· You have a fever.   °· You have chest pain.  °· You have a rash. °· You have shortness of breath. °· You have trouble breathing.   °· You feel weak, lightheaded, dizzy, or faint.   °MAKE SURE YOU:  °· Understand these instructions.   °· Will watch your condition.   °· Will get help right away if you are not doing well or get worse. °Document Released: 02/20/2013 Document Reviewed: 02/20/2013 °[employer]® Patient Information ©2015 [employer]. This information is not intended to replace advice given to you by your health care provider. Make sure you discuss any questions you have with your health care provider. ° °

## 2015-03-25 NOTE — Progress Notes (Addendum)
      [address]       [place] [address]             [phone]      4 Days Post-Op Procedure(s) (LRB): VIDEO ASSISTED THORACOSCOPY (Right) Right Upper and Lower Lobe Lung Wedge BIOPSY (Right)   Subjective:  Ms. Avilla states she is feeling better this morning.  She states she has been able to clear more sputum.  She is ambulating, no BM yet  Objective: Vital signs in last 24 hours: Temp:  [98.5 F (36.9 C)-99.1 F (37.3 C)] 98.5 F (36.9 C) (05/16 0430) Pulse Rate:  [92-95] 94 (05/16 0430) Cardiac Rhythm:  [-] Normal sinus rhythm (05/15 2030) Resp:  [20] 20 (05/16 0430) BP: (119-135)/(69-79) 131/79 mmHg (05/16 0430) SpO2:  [93 %-99 %] 99 % (05/16 0430)  Intake/Output from previous day: 05/15 0701 - 05/16 0700 In: 480 [P.O.:480] Out: 900 [Urine:900]  General appearance: alert, cooperative and no distress Heart: regular rate and rhythm Lungs: coarse bilaterally Abdomen: soft, non-tender; bowel sounds normal; no masses,  no organomegaly Extremities: edema trace Wound: clean and dry  Lab Results: No results for input(s): WBC, HGB, HCT, PLT in the last 72 hours. BMET: No results for input(s): NA, K, CL, CO2, GLUCOSE, BUN, CREATININE, CALCIUM in the last 72 hours.  PT/INR: No results for input(s): LABPROT, INR in the last 72 hours. ABG    Component Value Date/Time   PHART 7.306* 03/22/2015 0450   HCO3 26.1* 03/22/2015 0450   TCO2 28 03/22/2015 0450   ACIDBASEDEF 1.0 03/22/2015 0450   O2SAT 96.0 03/22/2015 0450   CBG (last 3)   Recent Labs  03/24/15 1125 03/24/15 1623 03/25/15 0622  GLUCAP 154* 146* 130*    Assessment/Plan: S/P Procedure(s) (LRB): VIDEO ASSISTED THORACOSCOPY (Right) Right Upper and Lower Lobe Lung Wedge BIOPSY (Right)  1. Pulm- remains on oxygen, however uses at home- Mucinex has helped with clearing sputum 2. DM- sugars controlled, continue insulin for now 3. GI- LOC Constipation, will order Lactulose 4. Dispo-  pathology remains pending, Lactulose for constipation, home this afternoon if remains stable   LOS: 4 days    Ellwood Handler 03/25/2015   Chart reviewed, patient examined, agree with above. Her path is still pending but I will check on it and call her.

## 2015-03-27 ENCOUNTER — Telehealth: Payer: Self-pay

## 2015-03-27 NOTE — Patient Outreach (Signed)
[facility] Care Management  03/27/2015  TRINA ASCH [birth year] [phone]    Cay left a message that she had recently been admitted to the hospital for a lung biopsy- no results yet.  Patient was SOB when leaving the message.  Gentry Fitz, RN, BA, MHA, CDE Diabetes Coordinator Inpatient Diabetes Program  [phone] (Team Pager) [phone] ([place]) 03/27/2015 11:06 AM

## 2015-03-28 ENCOUNTER — Telehealth: Payer: Self-pay | Admitting: Internal Medicine

## 2015-03-28 ENCOUNTER — Emergency Department (HOSPITAL_COMMUNITY): Payer: 59

## 2015-03-28 ENCOUNTER — Encounter (HOSPITAL_COMMUNITY): Payer: Self-pay

## 2015-03-28 ENCOUNTER — Inpatient Hospital Stay (HOSPITAL_COMMUNITY)
Admission: EM | Admit: 2015-03-28 | Discharge: 2015-04-02 | DRG: 196 | Disposition: A | Discharge status: Home / Self Care | Payer: 59 | Attending: Internal Medicine | Admitting: Internal Medicine

## 2015-03-28 ENCOUNTER — Encounter (HOSPITAL_COMMUNITY): Payer: Self-pay | Admitting: Emergency Medicine

## 2015-03-28 ENCOUNTER — Other Ambulatory Visit (HOSPITAL_COMMUNITY): Payer: Self-pay

## 2015-03-28 DIAGNOSIS — J9690 Respiratory failure, unspecified, unspecified whether with hypoxia or hypercapnia: Secondary | ICD-10-CM

## 2015-03-28 DIAGNOSIS — Z9981 Dependence on supplemental oxygen: Secondary | ICD-10-CM | POA: Diagnosis not present

## 2015-03-28 DIAGNOSIS — Z79899 Other long term (current) drug therapy: Secondary | ICD-10-CM | POA: Diagnosis not present

## 2015-03-28 DIAGNOSIS — J84112 Idiopathic pulmonary fibrosis: Principal | ICD-10-CM

## 2015-03-28 DIAGNOSIS — Z881 Allergy status to other antibiotic agents status: Secondary | ICD-10-CM | POA: Diagnosis not present

## 2015-03-28 DIAGNOSIS — M797 Fibromyalgia: Secondary | ICD-10-CM | POA: Diagnosis present

## 2015-03-28 DIAGNOSIS — Z794 Long term (current) use of insulin: Secondary | ICD-10-CM

## 2015-03-28 DIAGNOSIS — J939 Pneumothorax, unspecified: Secondary | ICD-10-CM | POA: Diagnosis present

## 2015-03-28 DIAGNOSIS — F329 Major depressive disorder, single episode, unspecified: Secondary | ICD-10-CM | POA: Diagnosis present

## 2015-03-28 DIAGNOSIS — I10 Essential (primary) hypertension: Secondary | ICD-10-CM | POA: Diagnosis not present

## 2015-03-28 DIAGNOSIS — G4733 Obstructive sleep apnea (adult) (pediatric): Secondary | ICD-10-CM | POA: Diagnosis not present

## 2015-03-28 DIAGNOSIS — J45909 Unspecified asthma, uncomplicated: Secondary | ICD-10-CM | POA: Insufficient documentation

## 2015-03-28 DIAGNOSIS — Z91010 Allergy to peanuts: Secondary | ICD-10-CM | POA: Diagnosis not present

## 2015-03-28 DIAGNOSIS — D649 Anemia, unspecified: Secondary | ICD-10-CM | POA: Diagnosis not present

## 2015-03-28 DIAGNOSIS — K219 Gastro-esophageal reflux disease without esophagitis: Secondary | ICD-10-CM | POA: Diagnosis present

## 2015-03-28 DIAGNOSIS — J984 Other disorders of lung: Secondary | ICD-10-CM | POA: Insufficient documentation

## 2015-03-28 DIAGNOSIS — Y95 Nosocomial condition: Secondary | ICD-10-CM | POA: Diagnosis present

## 2015-03-28 DIAGNOSIS — R0902 Hypoxemia: Secondary | ICD-10-CM | POA: Insufficient documentation

## 2015-03-28 DIAGNOSIS — Z91040 Latex allergy status: Secondary | ICD-10-CM | POA: Diagnosis not present

## 2015-03-28 DIAGNOSIS — E039 Hypothyroidism, unspecified: Secondary | ICD-10-CM | POA: Diagnosis not present

## 2015-03-28 DIAGNOSIS — J9621 Acute and chronic respiratory failure with hypoxia: Secondary | ICD-10-CM | POA: Diagnosis present

## 2015-03-28 DIAGNOSIS — R6889 Other general symptoms and signs: Secondary | ICD-10-CM

## 2015-03-28 DIAGNOSIS — J849 Interstitial pulmonary disease, unspecified: Secondary | ICD-10-CM | POA: Diagnosis present

## 2015-03-28 DIAGNOSIS — J9601 Acute respiratory failure with hypoxia: Secondary | ICD-10-CM

## 2015-03-28 DIAGNOSIS — R0602 Shortness of breath: Secondary | ICD-10-CM

## 2015-03-28 DIAGNOSIS — K760 Fatty (change of) liver, not elsewhere classified: Secondary | ICD-10-CM | POA: Diagnosis present

## 2015-03-28 DIAGNOSIS — E119 Type 2 diabetes mellitus without complications: Secondary | ICD-10-CM | POA: Diagnosis not present

## 2015-03-28 DIAGNOSIS — J189 Pneumonia, unspecified organism: Secondary | ICD-10-CM | POA: Diagnosis present

## 2015-03-28 LAB — I-STAT CHEM 8, ED
BUN: 14 mg/dL (ref 6–20)
CHLORIDE: 99 mmol/L — AB (ref 101–111)
Calcium, Ion: 1.18 mmol/L (ref 1.12–1.23)
Creatinine, Ser: 1 mg/dL (ref 0.44–1.00)
GLUCOSE: 187 mg/dL — AB (ref 65–99)
HEMATOCRIT: 33 % — AB (ref 36.0–46.0)
HEMOGLOBIN: 11.2 g/dL — AB (ref 12.0–15.0)
POTASSIUM: 3.7 mmol/L (ref 3.5–5.1)
SODIUM: 138 mmol/L (ref 135–145)
TCO2: 22 mmol/L (ref 0–100)

## 2015-03-28 LAB — CBC WITH DIFFERENTIAL/PLATELET
Basophils Absolute: 0 10*3/uL (ref 0.0–0.1)
Basophils Relative: 0 % (ref 0–1)
EOS ABS: 1 10*3/uL — AB (ref 0.0–0.7)
Eosinophils Relative: 11 % — ABNORMAL HIGH (ref 0–5)
HEMATOCRIT: 32.2 % — AB (ref 36.0–46.0)
Hemoglobin: 10.1 g/dL — ABNORMAL LOW (ref 12.0–15.0)
LYMPHS ABS: 1.7 10*3/uL (ref 0.7–4.0)
Lymphocytes Relative: 19 % (ref 12–46)
MCH: 27.4 pg (ref 26.0–34.0)
MCHC: 31.4 g/dL (ref 30.0–36.0)
MCV: 87.3 fL (ref 78.0–100.0)
Monocytes Absolute: 0.8 10*3/uL (ref 0.1–1.0)
Monocytes Relative: 9 % (ref 3–12)
Neutro Abs: 5.2 10*3/uL (ref 1.7–7.7)
Neutrophils Relative %: 61 % (ref 43–77)
PLATELETS: 432 10*3/uL — AB (ref 150–400)
RBC: 3.69 MIL/uL — AB (ref 3.87–5.11)
RDW: 14.2 % (ref 11.5–15.5)
WBC: 8.7 10*3/uL (ref 4.0–10.5)

## 2015-03-28 LAB — BRAIN NATRIURETIC PEPTIDE: B NATRIURETIC PEPTIDE 5: 17.3 pg/mL (ref 0.0–100.0)

## 2015-03-28 LAB — CREATININE, SERUM
Creatinine, Ser: 0.94 mg/dL (ref 0.44–1.00)
GFR calc non Af Amer: >60 mL/min (ref >60)

## 2015-03-28 LAB — I-STAT TROPONIN, ED
TROPONIN I, POC: 0 ng/mL (ref 0.00–0.08)
Troponin i, poc: 0 ng/mL (ref 0.00–0.08)

## 2015-03-28 LAB — TROPONIN I: Troponin I: <0.03 ng/mL (ref <0.031)

## 2015-03-28 MED ORDER — ONDANSETRON HCL 4 MG PO TABS: 4.0000 mg | ORAL_TABLET | Freq: Four times a day (QID) | ORAL | Status: DC | PRN

## 2015-03-28 MED ORDER — TRIAMTERENE-HCTZ 37.5-25 MG PO TABS
1.0000 | ORAL_TABLET | Freq: Every evening | ORAL | Status: DC
  Administered 2015-03-28: 1 via ORAL
  Filled 2015-03-28 (×2): qty 1

## 2015-03-28 MED ORDER — TRAZODONE HCL 50 MG PO TABS
75.0000 mg | ORAL_TABLET | Freq: Every day | ORAL | Status: DC
  Administered 2015-03-29 – 2015-04-01 (×5): 75 mg via ORAL
  Filled 2015-03-28 (×6): qty 1

## 2015-03-28 MED ORDER — ENOXAPARIN SODIUM 40 MG/0.4ML ~~LOC~~ SOLN
40.0000 mg | SUBCUTANEOUS | Status: DC
  Administered 2015-03-29 – 2015-04-01 (×5): 40 mg via SUBCUTANEOUS
  Filled 2015-03-28 (×6): qty 0.4

## 2015-03-28 MED ORDER — ACETAMINOPHEN 650 MG RE SUPP: 650.0000 mg | Freq: Four times a day (QID) | RECTAL | Status: DC | PRN

## 2015-03-28 MED ORDER — TOPIRAMATE 25 MG PO TABS
25.0000 mg | ORAL_TABLET | Freq: Two times a day (BID) | ORAL | Status: DC
  Administered 2015-03-29 – 2015-04-02 (×10): 25 mg via ORAL
  Filled 2015-03-28 (×11): qty 1

## 2015-03-28 MED ORDER — OXYCODONE HCL 5 MG PO TABS
5.0000 mg | ORAL_TABLET | ORAL | Status: DC | PRN
  Administered 2015-03-30 – 2015-04-02 (×7): 5 mg via ORAL
  Filled 2015-03-28 (×7): qty 1

## 2015-03-28 MED ORDER — DICYCLOMINE HCL 20 MG PO TABS
20.0000 mg | ORAL_TABLET | Freq: Four times a day (QID) | ORAL | Status: DC
  Administered 2015-03-29 – 2015-04-02 (×19): 20 mg via ORAL
  Filled 2015-03-28 (×24): qty 1

## 2015-03-28 MED ORDER — GUAIFENESIN ER 600 MG PO TB12
600.0000 mg | ORAL_TABLET | Freq: Two times a day (BID) | ORAL | Status: DC
  Administered 2015-03-29 – 2015-04-02 (×10): 600 mg via ORAL
  Filled 2015-03-28 (×11): qty 1

## 2015-03-28 MED ORDER — VITAMIN E 180 MG (400 UNIT) PO CAPS
800.0000 [IU] | ORAL_CAPSULE | Freq: Every day | ORAL | Status: DC
  Administered 2015-03-29 – 2015-04-02 (×6): 800 [IU] via ORAL
  Filled 2015-03-28 (×6): qty 2

## 2015-03-28 MED ORDER — PANTOPRAZOLE SODIUM 40 MG PO TBEC
40.0000 mg | DELAYED_RELEASE_TABLET | Freq: Every day | ORAL | Status: DC
  Administered 2015-03-29 – 2015-04-02 (×6): 40 mg via ORAL
  Filled 2015-03-28 (×6): qty 1

## 2015-03-28 MED ORDER — SODIUM CHLORIDE 0.9 % IJ SOLN
3.0000 mL | Freq: Two times a day (BID) | INTRAMUSCULAR | Status: DC
  Administered 2015-03-29 – 2015-04-02 (×9): 3 mL via INTRAVENOUS

## 2015-03-28 MED ORDER — FUROSEMIDE 10 MG/ML IJ SOLN
20.0000 mg | Freq: Once | INTRAMUSCULAR | Status: AC
  Administered 2015-03-29: 20 mg via INTRAVENOUS
  Filled 2015-03-28: qty 2

## 2015-03-28 MED ORDER — ACETAMINOPHEN 325 MG PO TABS: 650.0000 mg | ORAL_TABLET | Freq: Four times a day (QID) | ORAL | Status: DC | PRN

## 2015-03-28 MED ORDER — MOMETASONE FURO-FORMOTEROL FUM 100-5 MCG/ACT IN AERO
2.0000 | INHALATION_SPRAY | Freq: Two times a day (BID) | RESPIRATORY_TRACT | Status: DC
  Administered 2015-03-29: 2 via RESPIRATORY_TRACT
  Filled 2015-03-28: qty 8.8

## 2015-03-28 MED ORDER — ALPRAZOLAM 0.5 MG PO TABS: 0.5000 mg | ORAL_TABLET | Freq: Every day | ORAL | Status: DC | PRN

## 2015-03-28 MED ORDER — LEVOTHYROXINE SODIUM 200 MCG PO TABS
200.0000 ug | ORAL_TABLET | Freq: Every day | ORAL | Status: DC
  Administered 2015-03-29 – 2015-04-02 (×5): 200 ug via ORAL
  Filled 2015-03-28 (×7): qty 1

## 2015-03-28 MED ORDER — DULOXETINE HCL 60 MG PO CPEP
60.0000 mg | ORAL_CAPSULE | Freq: Every day | ORAL | Status: DC
  Administered 2015-03-29 – 2015-04-02 (×6): 60 mg via ORAL
  Filled 2015-03-28 (×6): qty 1

## 2015-03-28 MED ORDER — AMLODIPINE BESYLATE 5 MG PO TABS
5.0000 mg | ORAL_TABLET | Freq: Every morning | ORAL | Status: DC
  Filled 2015-03-28: qty 1

## 2015-03-28 MED ORDER — ONDANSETRON HCL 4 MG/2ML IJ SOLN: 4.0000 mg | Freq: Four times a day (QID) | INTRAMUSCULAR | Status: DC | PRN

## 2015-03-28 MED ORDER — INSULIN ASPART 100 UNIT/ML ~~LOC~~ SOLN: 0.0000 [IU] | Freq: Three times a day (TID) | SUBCUTANEOUS | Status: DC

## 2015-03-28 MED ORDER — INSULIN GLARGINE 100 UNIT/ML ~~LOC~~ SOLN
50.0000 [IU] | Freq: Every day | SUBCUTANEOUS | Status: DC
  Administered 2015-03-29 (×2): 50 [IU] via SUBCUTANEOUS
  Filled 2015-03-28 (×2): qty 0.5

## 2015-03-28 MED ORDER — MECLIZINE HCL 25 MG PO TABS
25.0000 mg | ORAL_TABLET | Freq: Three times a day (TID) | ORAL | Status: DC | PRN
  Filled 2015-03-28: qty 1

## 2015-03-28 MED ORDER — ONDANSETRON HCL 4 MG PO TABS: 4.0000 mg | ORAL_TABLET | Freq: Three times a day (TID) | ORAL | Status: DC | PRN

## 2015-03-28 NOTE — Telephone Encounter (Signed)
Her lung path i sback and she has UIP wit lymphoid hyperplasia.   She definitely needs to see rheum She needs to come and see me next 10 days - when can you work her in

## 2015-03-28 NOTE — ED Notes (Addendum)
Pt st's she started feeling short of breath today while at rest.  Pt is currently on home 02 via [place] at 2LPM st's she had to increase it to 3LPM.  Pt speaking in full sentences.  Pt had a lung biopsy 1 week ago and hasn' t felt good since

## 2015-03-28 NOTE — Progress Notes (Signed)
Report rec'd.  Awaiting patient's arrival. 

## 2015-03-28 NOTE — ED Provider Notes (Signed)
CSN: [id number]     Arrival date & time 03/28/15  1644 History   First MD Initiated Contact with Patient 03/28/15 2007     Chief Complaint  Patient presents with  . Shortness of Breath   (Consider location/radiation/quality/duration/timing/severity/associated sxs/prior Treatment) HPI Joann [name] is a 54 yo female presenting with shortness of breath. She's been feeling more short of breath and fatigue over the last 2 days. She was recently discharged from hospital 3 days ago, where she spent time in the stepdown and ICU after lung biopsy. She states she felt close to her baseline the day she was discharged and the following day, however yesterday and today her shortness of breath worsened and she has needed to turn up her oxygen from 3 L to 4 L at home.  She continues to cough but still feels short of breath even with the increased O2.  She reports a temperature of 99 degrees at home, which is higher than her normal.  She denies chest pain, productive cough, nausea, vomiting, diaphoresis or abd pain.   Past Medical History  Diagnosis Date  . Asthma   . Diabetes mellitus   . Headache(784.0)     MIGRAINES  . Fibromyalgia   . GERD (gastroesophageal reflux disease)   . IBS (irritable bowel syndrome)   . Renal mass, right   . IC (interstitial cystitis)   . Frequency of urination   . Anemia   . Swelling of both lower extremities     TAKES MAXIDE FOR SWELLING (DENIES HBP)  . Hypothyroidism   . Depression   . Cancer     kidney  . Hypertension   . Wears glasses   . Sleep apnea     uses a cpap  . Pulmonary fibrosis   . Pneumonia   . Fatty liver   . Oxygen dependent     2 Liters at rest, 3 liters with activity   Past Surgical History  Procedure Laterality Date  . Cesarean section  1983, 1985     x 2  . Bladder tack    . Cystocopy    . Cystoscopy    . Robotic assited partial nephrectomy Right 12/14/2013    Procedure: ROBOTIC ASSITED PARTIAL NEPHRECTOMY;  Surgeon: Dutch Gray, MD;   Location: [facility];  Service: Urology;  Laterality: Right;  . Cystoscopy w/ ureteral stent placement Right 12/14/2013    Procedure: CYSTOSCOPY WITH RETROGRADE PYELOGRAM/URETERAL STENT PLACEMENT;  Surgeon: Dutch Gray, MD;  Location: [facility];  Service: Urology;  Laterality: Right;  . Cervical laminectomy  07/2012    anterior with plates and screws.  . Abdominal hysterectomy  1996  . Cholecystectomy  1995    tubal ligation also  . Posterior cervical laminectomy  2004  . Tubal ligation    . Colonoscopy    . Incisional hernia repair N/A 09/12/2014    Procedure: HERNIA REPAIR INCISIONAL;  Surgeon: Coralie Keens, MD;  Location: [facility];  Service: General;  Laterality: N/A;  . Insertion of mesh N/A 09/12/2014    Procedure: INSERTION OF MESH;  Surgeon: Coralie Keens, MD;  Location: [facility];  Service: General;  Laterality: N/A;  . Video assisted thoracoscopy Right 03/21/2015    Procedure: VIDEO ASSISTED THORACOSCOPY;  Surgeon: Gaye Pollack, MD;  Location: [facility] OR;  Service: Thoracic;  Laterality: Right;  . Lung biopsy Right 03/21/2015    Procedure: Right Upper and Lower Lobe Lung Wedge BIOPSY;  Surgeon: Gaye Pollack, MD;  Location: [facility] OR;  Service: Thoracic;  Laterality: Right;   Family History  Problem Relation Age of Onset  . Cancer Mother     breast, lung  . COPD Father   . Hypertension Father   . Hyperlipidemia Father   . Stroke Father    History  Substance Use Topics  . Smoking status: Never Smoker   . Smokeless tobacco: Never Used  . Alcohol Use: Yes     Comment: occasional   OB History    No data available     Review of Systems  Constitutional: Positive for fatigue. Negative for fever and chills.  HENT: Negative for sore throat.   Eyes: Negative for visual disturbance.  Respiratory: Positive for cough and shortness of breath.   Cardiovascular: Negative for chest pain and leg swelling.  Gastrointestinal: Negative for nausea, vomiting and  diarrhea.  Genitourinary: Negative for dysuria.  Musculoskeletal: Negative for myalgias.  Skin: Negative for rash.  Neurological: Negative for weakness, numbness and headaches.      Allergies  Peanut-containing drug products; Sulfa antibiotics; Betadine; and Latex  Home Medications   Prior to Admission medications   Medication Sig Start Date End Date Taking? Authorizing Provider  albuterol (PROVENTIL HFA;VENTOLIN HFA) 108 (90 BASE) MCG/ACT inhaler Inhale 2 puffs into the lungs every 6 (six) hours as needed. For shortness of breath    Historical Provider, MD  ALPRAZolam Duanne Moron) 0.5 MG tablet Take 0.5 mg by mouth daily as needed for anxiety.     Historical Provider, MD  amLODipine (NORVASC) 5 MG tablet Take 5 mg by mouth every morning.     Historical Provider, MD  cholecalciferol (VITAMIN D) 1000 UNITS tablet Take 1,000 Units by mouth daily.    Historical Provider, MD  dicyclomine (BENTYL) 20 MG tablet Take 20 mg by mouth every 6 (six) hours.    Historical Provider, MD  DULoxetine (CYMBALTA) 60 MG capsule Take 60 mg by mouth daily.    Historical Provider, MD  esomeprazole (NEXIUM) 40 MG capsule Take 40 mg by mouth 2 (two) times daily before a meal.     Historical Provider, MD  estrogens, conjugated, (PREMARIN) 0.625 MG tablet Take 0.625 mg by mouth daily.     Historical Provider, MD  Fluticasone-Salmeterol (ADVAIR) 100-50 MCG/DOSE AEPB Inhale 1 puff into the lungs as needed.     Historical Provider, MD  guaiFENesin (MUCINEX) 600 MG 12 hr tablet Take 1 tablet (600 mg total) by mouth 2 (two) times daily. 03/25/15   Erin R Barrett, PA-C  indomethacin (INDOCIN) 25 MG capsule Take 25 mg by mouth 2 (two) times daily with a meal.    Historical Provider, MD  insulin aspart (NOVOLOG) 100 UNIT/ML injection Inject 15 Units into the skin daily as needed for high blood sugar (with largestmeal if sugar is over 200).    Historical Provider, MD  insulin glargine (LANTUS) 100 UNIT/ML injection Inject 50  Units into the skin at bedtime.     Historical Provider, MD  levothyroxine (SYNTHROID, LEVOTHROID) 200 MCG tablet Take 200 mcg by mouth daily before breakfast.    Historical Provider, MD  meclizine (ANTIVERT) 25 MG tablet Take 25 mg by mouth 3 (three) times daily as needed for dizziness.    Historical Provider, MD  metFORMIN (GLUCOPHAGE-XR) 500 MG 24 hr tablet Take 1,000 mg by mouth 2 (two) times daily.    Historical Provider, MD  ondansetron (ZOFRAN) 4 MG tablet Take 4 mg by mouth every 8 (eight) hours as needed for nausea  or vomiting.    Historical Provider, MD  oxyCODONE (OXY IR/ROXICODONE) 5 MG immediate release tablet Take 1-2 tablets (5-10 mg total) by mouth every 4 (four) hours as needed for severe pain. 03/25/15   Erin R Barrett, PA-C  topiramate (TOPAMAX) 25 MG tablet Take 25 mg by mouth 2 (two) times daily.    Historical Provider, MD  traZODone (DESYREL) 150 MG tablet Take 75 mg by mouth at bedtime.    Historical Provider, MD  triamterene-hydrochlorothiazide (MAXZIDE-25) 37.5-25 MG per tablet Take 1 tablet by mouth every evening.    Historical Provider, MD  vitamin E 400 UNIT capsule Take 800 Units by mouth daily.    Historical Provider, MD   BP 128/76 mmHg  Pulse 89  Temp(Src) 99.3 F (37.4 C) (Oral)  Resp 26  SpO2 99% Physical Exam  Constitutional: She appears well-developed and well-nourished. No distress.  HENT:  Head: Normocephalic and atraumatic.  Mouth/Throat: Oropharynx is clear and moist.  Eyes: Conjunctivae are normal.  Neck: Neck supple.  Cardiovascular: Regular rhythm and intact distal pulses.  Tachycardia present.   Pulmonary/Chest: Tachypnea noted. No respiratory distress. She has decreased breath sounds in the right upper field. She has no wheezes. She has no rhonchi. She has no rales. She exhibits no tenderness.  Increased respiratory effort with movement  Abdominal: Soft. There is no tenderness.  Musculoskeletal: She exhibits no tenderness.  Lymphadenopathy:     She has no cervical adenopathy.  Neurological: She is alert.  Skin: Skin is warm and dry. No rash noted. She is not diaphoretic.  Psychiatric: She has a normal mood and affect.  Nursing note and vitals reviewed.   ED Course  Procedures (including critical care time) Labs Review Labs Reviewed  CBC WITH DIFFERENTIAL/PLATELET - Abnormal; Notable for the following:    RBC 3.69 (*)    Hemoglobin 10.1 (*)    HCT 32.2 (*)    Platelets 432 (*)    Eosinophils Relative 11 (*)    Eosinophils Absolute 1.0 (*)    All other components within normal limits  I-STAT CHEM 8, ED - Abnormal; Notable for the following:    Chloride 99 (*)    Glucose, Bld 187 (*)    Hemoglobin 11.2 (*)    HCT 33.0 (*)    All other components within normal limits  I-STAT TROPOININ, ED  Randolm Idol, ED    Imaging Review Dg Chest 2 View  03/28/2015   CLINICAL DATA:  Shortness of breath, weakness for 4 days  EXAM: CHEST  2 VIEW  COMPARISON:  03/24/2015  FINDINGS: There is a persistent small right pneumothorax which has decreased in size compared with 03/24/2015.  There is no pleural effusion. There is no left pneumothorax. There is bilateral diffuse chronic interstitial lung disease. Stable cardiomediastinal silhouette. No acute osseous abnormality.  IMPRESSION: 1. Persistent small right pneumothorax which has decreased in size compared with 03/24/2015.   Electronically Signed   By: Kathreen Devoid   On: 03/28/2015 17:33     EKG Interpretation None      MDM   Final diagnoses:  Shortness of breath  Increased oxygen demand   54 yo with pulmonary fibrosis and 2 days of worsening dyspnea and increase O2 requirement. While laying on ED stretcher her O2 sats are 96% on 4L but drop quickly to 70% when moving to sitting position or standing.  Discussed case with Dr. Audie Pinto. Her labs and EKG without significant abnormality.  CXR shows small pneumothorax but has improved  since last x-ray.   8:30 PM: Consulted Dr.  Hal Hope (hospitalist) regarding pt's case for admission for hypoxia.  Pt accepted to step down bed but requests CT surgery to be made aware of pt.  8:50 PM: Cardiothoracic surgery contacted and made aware of pt's condition and admission for dyspnea. Pt and family made aware of results and plan. She is tachypneic but in no acute distress while laying in bed. The patient appears reasonably stabilized for admission considering the current resources, flow, and capabilities available in the ED at this time, and I doubt any further screening and/or treatment in the ED prior to admission.   Filed Vitals:   03/28/15 1938 03/28/15 1939 03/28/15 1940 03/28/15 1945  BP:    128/76  Pulse: 114 104 96 89  Temp:      TempSrc:      Resp:   23 26  SpO2: 71% 71% 96% 99%   Meds given in ED:  Medications - No data to display  New Prescriptions   No medications on file       Britt Bottom, NP 03/29/15 1642  Leonard Schwartz, MD 03/29/15 1816

## 2015-03-28 NOTE — Telephone Encounter (Signed)
Patient says it hurts more to breath today, feels like she has a fever.  Feeling very fatigued. Has oxygen on 3L but oxygen still around 85%. Non- productive cough.  Feels very tight in the chest. As I was talking to patient, she became more and more breathless, she become somewhat lethargic over the phone and I advised patient to go to ER.  Patient is on her way to ER now.  Will put in my box to call tomorrow and check on patient.

## 2015-03-28 NOTE — H&P (Addendum)
[facility] History and Physical  Joann [name] CLE:[id number] DOB: [birth year] DOA: 03/28/2015  Referring physician: Ms.Tysinger. PCP: Velna Hatchet, MD  Specialists: Dr.Ramaswamy.  Chief Complaint: Shortness of breath.  HPI: Joann [name] is a 54 y.o. female with history of diabetes mellitus type 2, hypertension, hypothyroidism who was recently diagnosed with poorly fibrosis and had biopsy done last week presents to the ER because of worsening shortness of breath. Patient states she is usually short of breath last few weeks but over the last 2 days has become acutely short of breath to the point she is not able to walk even a few meters. Patient denies any chest pain cough or productive sputum. In the ER chest x-ray shows improving small pneumothorax on the right side. Patient on exam is not in distress but is requiring at least 4-5 L of oxygen. Patient denies any nausea vomiting abdominal pain fever chills. Patient is admitted for acute on chronic respiratory failure.    Review of Systems: As presented in the history of presenting illness, rest negative.  Past Medical History  Diagnosis Date  . Asthma   . Diabetes mellitus   . Headache(784.0)     MIGRAINES  . Fibromyalgia   . GERD (gastroesophageal reflux disease)   . IBS (irritable bowel syndrome)   . Renal mass, right   . IC (interstitial cystitis)   . Frequency of urination   . Anemia   . Swelling of both lower extremities     TAKES MAXIDE FOR SWELLING (DENIES HBP)  . Hypothyroidism   . Depression   . Cancer     kidney  . Hypertension   . Wears glasses   . Sleep apnea     uses a cpap  . Pulmonary fibrosis   . Pneumonia   . Fatty liver   . Oxygen dependent     2 Liters at rest, 3 liters with activity   Past Surgical History  Procedure Laterality Date  . Cesarean section  1983, 1985     x 2  . Bladder tack    . Cystocopy    . Cystoscopy    . Robotic assited partial nephrectomy Right 12/14/2013    Procedure:  ROBOTIC ASSITED PARTIAL NEPHRECTOMY;  Surgeon: Dutch Gray, MD;  Location: [facility];  Service: Urology;  Laterality: Right;  . Cystoscopy w/ ureteral stent placement Right 12/14/2013    Procedure: CYSTOSCOPY WITH RETROGRADE PYELOGRAM/URETERAL STENT PLACEMENT;  Surgeon: Dutch Gray, MD;  Location: [facility];  Service: Urology;  Laterality: Right;  . Cervical laminectomy  07/2012    anterior with plates and screws.  . Abdominal hysterectomy  1996  . Cholecystectomy  1995    tubal ligation also  . Posterior cervical laminectomy  2004  . Tubal ligation    . Colonoscopy    . Incisional hernia repair N/A 09/12/2014    Procedure: HERNIA REPAIR INCISIONAL;  Surgeon: Coralie Keens, MD;  Location: [facility];  Service: General;  Laterality: N/A;  . Insertion of mesh N/A 09/12/2014    Procedure: INSERTION OF MESH;  Surgeon: Coralie Keens, MD;  Location: [facility];  Service: General;  Laterality: N/A;  . Video assisted thoracoscopy Right 03/21/2015    Procedure: VIDEO ASSISTED THORACOSCOPY;  Surgeon: Gaye Pollack, MD;  Location: [facility] OR;  Service: Thoracic;  Laterality: Right;  . Lung biopsy Right 03/21/2015    Procedure: Right Upper and Lower Lobe Lung Wedge BIOPSY;  Surgeon: Gaye Pollack, MD;  Location: [facility]  OR;  Service: Thoracic;  Laterality: Right;   Social History:  reports that she has never smoked. She has never used smokeless tobacco. She reports that she drinks alcohol. She reports that she does not use illicit drugs. Where does patient live home. Can patient participate in ADLs? Yes.  Allergies  Allergen Reactions  . Peanut-Containing Drug Products Shortness Of Breath    Pecans, walnuts as well  . Sulfa Antibiotics Hives and Shortness Of Breath  . Betadine [Povidone Iodine] Rash  . Latex Rash    Family History:  Family History  Problem Relation Age of Onset  . Cancer Mother     breast, lung  . COPD Father   . Hypertension Father   . Hyperlipidemia Father    . Stroke Father       Prior to Admission medications   Medication Sig Start Date End Date Taking? Authorizing Provider  albuterol (PROVENTIL HFA;VENTOLIN HFA) 108 (90 BASE) MCG/ACT inhaler Inhale 2 puffs into the lungs every 6 (six) hours as needed. For shortness of breath   Yes Historical Provider, MD  ALPRAZolam Duanne Moron) 0.5 MG tablet Take 0.5 mg by mouth daily as needed for anxiety.    Yes Historical Provider, MD  amLODipine (NORVASC) 5 MG tablet Take 5 mg by mouth every morning.    Yes Historical Provider, MD  cholecalciferol (VITAMIN D) 1000 UNITS tablet Take 1,000 Units by mouth daily.   Yes Historical Provider, MD  dicyclomine (BENTYL) 20 MG tablet Take 20 mg by mouth every 6 (six) hours.   Yes Historical Provider, MD  DULoxetine (CYMBALTA) 60 MG capsule Take 60 mg by mouth daily.   Yes Historical Provider, MD  esomeprazole (NEXIUM) 40 MG capsule Take 40 mg by mouth 2 (two) times daily before a meal.    Yes Historical Provider, MD  estrogens, conjugated, (PREMARIN) 0.625 MG tablet Take 0.625 mg by mouth daily.    Yes Historical Provider, MD  Fluticasone-Salmeterol (ADVAIR) 100-50 MCG/DOSE AEPB Inhale 1 puff into the lungs as needed (shortness of breath).    Yes Historical Provider, MD  guaiFENesin (MUCINEX) 600 MG 12 hr tablet Take 1 tablet (600 mg total) by mouth 2 (two) times daily. 03/25/15  Yes Erin R Barrett, PA-C  indomethacin (INDOCIN) 25 MG capsule Take 25 mg by mouth 2 (two) times daily with a meal.   Yes Historical Provider, MD  insulin aspart (NOVOLOG) 100 UNIT/ML injection Inject 15 Units into the skin daily as needed for high blood sugar.    Yes Historical Provider, MD  insulin glargine (LANTUS) 100 UNIT/ML injection Inject 50 Units into the skin at bedtime.    Yes Historical Provider, MD  levothyroxine (SYNTHROID, LEVOTHROID) 200 MCG tablet Take 200 mcg by mouth daily before breakfast.   Yes Historical Provider, MD  meclizine (ANTIVERT) 25 MG tablet Take 25 mg by mouth 3  (three) times daily as needed for dizziness.   Yes Historical Provider, MD  metFORMIN (GLUCOPHAGE-XR) 500 MG 24 hr tablet Take 1,000 mg by mouth 2 (two) times daily.   Yes Historical Provider, MD  ondansetron (ZOFRAN) 4 MG tablet Take 4 mg by mouth every 8 (eight) hours as needed for nausea or vomiting.   Yes Historical Provider, MD  oxyCODONE (OXY IR/ROXICODONE) 5 MG immediate release tablet Take 1-2 tablets (5-10 mg total) by mouth every 4 (four) hours as needed for severe pain. 03/25/15  Yes Erin R Barrett, PA-C  topiramate (TOPAMAX) 25 MG tablet Take 25 mg by mouth 2 (two) times  daily.   Yes Historical Provider, MD  traZODone (DESYREL) 150 MG tablet Take 75 mg by mouth at bedtime.   Yes Historical Provider, MD  triamterene-hydrochlorothiazide (MAXZIDE-25) 37.5-25 MG per tablet Take 1 tablet by mouth every evening.   Yes Historical Provider, MD  vitamin E 400 UNIT capsule Take 800 Units by mouth daily.   Yes Historical Provider, MD    Physical Exam: Filed Vitals:   03/28/15 2030 03/28/15 2045 03/28/15 2100 03/28/15 2123  BP: 112/70 123/71 108/64   Pulse: 93 93 94   Temp:      TempSrc:      Resp: 31 33 32   Height:    5' (1.524 m)  Weight:    83.87 kg (184 lb 14.4 oz)  SpO2: 100% 100% 98% 100%     General:  Well-developed and nourished.  Eyes: Anicteric no pallor.  ENT: No discharge from the ears eyes nose and mouth.  Neck: No mass felt. No JVD appreciated.  Cardiovascular: S1 and S2 heard.  Respiratory: No rhonchi or crepitations.  Abdomen: Soft nontender bowel sounds present.  Skin: No rash.  Musculoskeletal: No edema.  Psychiatric: Appears normal.  Neurologic: Alert awake oriented times place and person. Moves all extremities.  Labs on Admission:  Basic Metabolic Panel:  Recent Labs Lab 03/22/15 0445 03/28/15 1720  NA 135 138  K 3.7 3.7  CL 99* 99*  CO2 26  --   GLUCOSE 141* 187*  BUN 10 14  CREATININE 0.98 1.00  CALCIUM 8.2*  --    Liver Function  Tests: No results for input(s): AST, ALT, ALKPHOS, BILITOT, PROT, ALBUMIN in the last 168 hours. No results for input(s): LIPASE, AMYLASE in the last 168 hours. No results for input(s): AMMONIA in the last 168 hours. CBC:  Recent Labs Lab 03/22/15 0445 03/28/15 1707 03/28/15 1720  WBC 10.5 8.7  --   NEUTROABS  --  5.2  --   HGB 9.6* 10.1* 11.2*  HCT 30.5* 32.2* 33.0*  MCV 87.6 87.3  --   PLT 289 432*  --    Cardiac Enzymes: No results for input(s): CKTOTAL, CKMB, CKMBINDEX, TROPONINI in the last 168 hours.  BNP (last 3 results)  Recent Labs  02/27/15 1843 02/28/15 1152  BNP 27.7 66.7    ProBNP (last 3 results) No results for input(s): PROBNP in the last 8760 hours.  CBG:  Recent Labs Lab 03/24/15 1623 03/24/15 2122 03/25/15 0622 03/25/15 1112 03/25/15 1624  GLUCAP 146* 127* 130* 240* 136*    Radiological Exams on Admission: Dg Chest 2 View  03/28/2015   CLINICAL DATA:  Shortness of breath, weakness for 4 days  EXAM: CHEST  2 VIEW  COMPARISON:  03/24/2015  FINDINGS: There is a persistent small right pneumothorax which has decreased in size compared with 03/24/2015.  There is no pleural effusion. There is no left pneumothorax. There is bilateral diffuse chronic interstitial lung disease. Stable cardiomediastinal silhouette. No acute osseous abnormality.  IMPRESSION: 1. Persistent small right pneumothorax which has decreased in size compared with 03/24/2015.   Electronically Signed   By: Kathreen Devoid   On: 03/28/2015 17:33    EKG: Independently reviewed. Normal sinus rhythm with nonspecific ST-T changes.  Assessment/Plan Principal Problem:   Acute respiratory failure with hypoxia Active Problems:   HTN (hypertension)   ILD (interstitial lung disease)   Diabetes mellitus type 2, controlled   Chronic anemia   Hypertension   Hypothyroidism   1. Acute respiratory failure with hypoxia -  I have discussed with Dr. Chase Caller, pulmonary critical care. Dr. Chase Caller  has requested to get CT angiogram of the chest to rule out PE. A small trial dose of Lasix 20 mg IV will be ordered. A she is mildly febrile for which influenza PCR has been ordered. Pulmonary critical care will be seeing patient in consult. 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 2. Diabetes mellitus type 2 - hold metformin while inpatient and continue Lantus with sliding scale coverage. 3. Chronic anemia - follow CBC. 4. Hypertension - closely monitor blood pressure trends. Continue home medications for now. 5. Hypothyroidism on Synthroid. 6. Recent lung biopsy and chest x-ray shows small right-sided pneumothorax which is improving.  - Cardiothoracic surgery aware of the admission. Pulmonary following.   Addendum - patient's CT angiogram of the chest is negative for PE but does show possible superimposed pneumonia for which I have started patient on vancomycin and cefepime. Patient's blood pressure is in the low normal for which I will hold off patient's antihypertensives for now.   DVT Prophylaxis Lovenox.  Code Status: Full code.  Family Communidiscussed with patient. Disposition Plan: Admit to inpatient.   Rajesh Wyss N. [facility] Pager [phone].  If 7PM-7AM, please contact night-coverage [email or web address] Password [facility] 03/28/2015, 9:55 PM

## 2015-03-28 NOTE — ED Notes (Addendum)
Pt placed on 15 L NRB due low oxygen saturation level. Respiratory notified.

## 2015-03-28 NOTE — ED Notes (Signed)
Pt taken off NRB and placed on 4 L [place]. Respiratory aware.

## 2015-03-28 NOTE — Telephone Encounter (Signed)
Pt has appt on 6/21 with PFT with MR.  MR please advise if you would like to keep this appt or have pt come in sooner. Thanks.

## 2015-03-28 NOTE — Telephone Encounter (Signed)
Not sure when her bx results will be back but 04/24/15 or so will be best. IF we get results earlier then we will get her in sooner

## 2015-03-28 NOTE — Consult Note (Signed)
Name: Joann [name] MRN: [id number] DOB: Mar 09, 1961    ADMISSION DATE:  03/28/2015 CONSULTATION DATE:  03/28/2015  REFERRING MD :  Hal Hope  CHIEF COMPLAINT:  SOB  BRIEF PATIENT DESCRIPTION: 54 y.o. F with recently diagnosed UIP with lymphoid hyperplasia (via VATS bx on 03/21/15),  brought to [facility] ED with persistent SOB and increasing O2 requirements. PCCM consulted for recs.  SIGNIFICANT EVENTS  03/21/15 - VATS with bx of RLL.  Pathology returned and noted for UIP with lymphoid hyperplasia 03/26/15 - discharged 5/19 - admitted with SOB and increase in O2 requirements.  STUDIES:  CXR 5/19 >>> persistent small right pneumothorax which has decreased in size compared to 03/24/15. CTA chest 5/19 >>>    HISTORY OF PRESENT ILLNESS:  Joann [name] is a 54 y.o. F with PMH as outlined below including recently confirmed UIP with lymphoid hyperplasia following VATS biopsy (Dr. Cyndia Bent - performed 03/21/15).  She presented to [facility] ED 5/19 with SOB and fatigue x 2 days.  She was just discharged 3 days ago after she had VATS biopsy performed which resulted as UIP with lymphoid hyperplasia.  Apparently on day of discharge, she felt well and at her baseline.  1 day later, she began to feel more SOB and had to increase her O2 at home from 3L to 4L.  She has had an associated dry cough.  No fevers/chills/sweats, chest pain, myalgias.  She did have a small right pneumothorax 1 day after VATS; however, CXR in ED 5/19 showed improvement in size.  PCCM was consulted for further recs.  During my assessment, pt resting comfortably in bed.  She has had increase in O2 requirements, up to 4L.  Denies any chest pain and feels SOB is improved with rest and exacerbated by exertion.  She denies any hx of thromboembolism, recent long travel or periods of immobilization (although she has been less active than before given her dyspnea), LE swelling, hemoptysis.  Does have a hx of right renal CA s/p surgical excision.    She is  scheduled to see Dr. Chase Caller as an outpatient next week (she thinks appt is on 04/02/15).   PAST MEDICAL HISTORY :   has a past medical history of Asthma; Diabetes mellitus; Headache(784.0); Fibromyalgia; GERD (gastroesophageal reflux disease); IBS (irritable bowel syndrome); Renal mass, right; IC (interstitial cystitis); Frequency of urination; Anemia; Swelling of both lower extremities; Hypothyroidism; Depression; Cancer; Hypertension; Wears glasses; Sleep apnea; Pulmonary fibrosis; Pneumonia; Fatty liver; and Oxygen dependent.  has past surgical history that includes Cesarean section (1983, 1985); [place]; CYSTOCOPY; Cystoscopy; Robotic assited partial nephrectomy (Right, 12/14/2013); Cystoscopy w/ ureteral stent placement (Right, 12/14/2013); Cervical laminectomy (07/2012); Abdominal hysterectomy (1996); Cholecystectomy (1995); Posterior cervical laminectomy (2004); Tubal ligation; Colonoscopy; Incisional hernia repair (N/A, 09/12/2014); Insertion of mesh (N/A, 09/12/2014); Video assisted thoracoscopy (Right, 03/21/2015); and Lung biopsy (Right, 03/21/2015). Prior to Admission medications   Medication Sig Start Date End Date Taking? Authorizing Provider  albuterol (PROVENTIL HFA;VENTOLIN HFA) 108 (90 BASE) MCG/ACT inhaler Inhale 2 puffs into the lungs every 6 (six) hours as needed. For shortness of breath   Yes Historical Provider, MD  ALPRAZolam Duanne Moron) 0.5 MG tablet Take 0.5 mg by mouth daily as needed for anxiety.    Yes Historical Provider, MD  amLODipine (NORVASC) 5 MG tablet Take 5 mg by mouth every morning.    Yes Historical Provider, MD  cholecalciferol (VITAMIN D) 1000 UNITS tablet Take 1,000 Units by mouth daily.   Yes Historical Provider, MD  dicyclomine (BENTYL)  20 MG tablet Take 20 mg by mouth every 6 (six) hours.   Yes Historical Provider, MD  DULoxetine (CYMBALTA) 60 MG capsule Take 60 mg by mouth daily.   Yes Historical Provider, MD  esomeprazole (NEXIUM) 40 MG capsule Take 40 mg by  mouth 2 (two) times daily before a meal.    Yes Historical Provider, MD  estrogens, conjugated, (PREMARIN) 0.625 MG tablet Take 0.625 mg by mouth daily.    Yes Historical Provider, MD  Fluticasone-Salmeterol (ADVAIR) 100-50 MCG/DOSE AEPB Inhale 1 puff into the lungs as needed (shortness of breath).    Yes Historical Provider, MD  guaiFENesin (MUCINEX) 600 MG 12 hr tablet Take 1 tablet (600 mg total) by mouth 2 (two) times daily. 03/25/15  Yes Erin R Barrett, PA-C  indomethacin (INDOCIN) 25 MG capsule Take 25 mg by mouth 2 (two) times daily with a meal.   Yes Historical Provider, MD  insulin aspart (NOVOLOG) 100 UNIT/ML injection Inject 15 Units into the skin daily as needed for high blood sugar.    Yes Historical Provider, MD  insulin glargine (LANTUS) 100 UNIT/ML injection Inject 50 Units into the skin at bedtime.    Yes Historical Provider, MD  levothyroxine (SYNTHROID, LEVOTHROID) 200 MCG tablet Take 200 mcg by mouth daily before breakfast.   Yes Historical Provider, MD  meclizine (ANTIVERT) 25 MG tablet Take 25 mg by mouth 3 (three) times daily as needed for dizziness.   Yes Historical Provider, MD  metFORMIN (GLUCOPHAGE-XR) 500 MG 24 hr tablet Take 1,000 mg by mouth 2 (two) times daily.   Yes Historical Provider, MD  ondansetron (ZOFRAN) 4 MG tablet Take 4 mg by mouth every 8 (eight) hours as needed for nausea or vomiting.   Yes Historical Provider, MD  oxyCODONE (OXY IR/ROXICODONE) 5 MG immediate release tablet Take 1-2 tablets (5-10 mg total) by mouth every 4 (four) hours as needed for severe pain. 03/25/15  Yes Erin R Barrett, PA-C  topiramate (TOPAMAX) 25 MG tablet Take 25 mg by mouth 2 (two) times daily.   Yes Historical Provider, MD  traZODone (DESYREL) 150 MG tablet Take 75 mg by mouth at bedtime.   Yes Historical Provider, MD  triamterene-hydrochlorothiazide (MAXZIDE-25) 37.5-25 MG per tablet Take 1 tablet by mouth every evening.   Yes Historical Provider, MD  vitamin E 400 UNIT capsule  Take 800 Units by mouth daily.   Yes Historical Provider, MD   Allergies  Allergen Reactions  . Peanut-Containing Drug Products Shortness Of Breath    Pecans, walnuts as well  . Sulfa Antibiotics Hives and Shortness Of Breath  . Betadine [Povidone Iodine] Rash  . Latex Rash    FAMILY HISTORY:  family history includes COPD in her father; Cancer in her mother; Hyperlipidemia in her father; Hypertension in her father; Stroke in her father. SOCIAL HISTORY:  reports that she has never smoked. She has never used smokeless tobacco. She reports that she drinks alcohol. She reports that she does not use illicit drugs.  REVIEW OF SYSTEMS:   All negative; except for those that are bolded, which indicate positives.  Constitutional: weight loss, weight gain, night sweats, fevers, chills, fatigue, weakness.  HEENT: headaches, sore throat, sneezing, nasal congestion, post nasal drip, difficulty swallowing, tooth/dental problems, visual complaints, visual changes, ear aches. Neuro: difficulty with speech, weakness, numbness, ataxia. CV:  chest pain, orthopnea, PND, swelling in lower extremities, dizziness, palpitations, syncope.  Resp: dry cough, hemoptysis, dyspnea, wheezing. GI  heartburn, indigestion, abdominal pain, nausea, vomiting, diarrhea, constipation,  change in bowel habits, loss of appetite, hematemesis, melena, hematochezia.  GU: dysuria, change in color of urine, urgency or frequency, flank pain, hematuria. MSK: joint pain or swelling, decreased range of motion. Psych: change in mood or affect, depression, anxiety, suicidal ideations, homicidal ideations. Skin: rash, itching, bruising.   SUBJECTIVE:  SOB improved with rest, exacerbated by exertion.  Feels comfortable on 4L O2.  VITAL SIGNS: Temp:  [99.1 F (37.3 C)-99.3 F (37.4 C)] 99.1 F (37.3 C) (05/19 1945) Pulse Rate:  [89-114] 94 (05/19 2100) Resp:  [22-33] 32 (05/19 2100) BP: (108-128)/(64-77) 108/64 mmHg (05/19  2100) SpO2:  [71 %-100 %] 100 % (05/19 2123) Weight:  [83.87 kg (184 lb 14.4 oz)] 83.87 kg (184 lb 14.4 oz) (05/19 2123)  PHYSICAL EXAMINATION: General: Pleasant WDWN adult female, resting in bed, in NAD. Neuro: A&O x 3, non-focal.  HEENT: [place]/AT. PERRL, sclerae anicteric. Cardiovascular: RRR, no M/R/G.  Lungs: Respirations even and unlabored.  CTA bilaterally, No W/R/R. Abdomen: BS x 4, soft, NT/ND.  Musculoskeletal: No gross deformities, no edema.  Skin: Intact, warm, no rashes.    Recent Labs Lab 03/22/15 0445 03/28/15 1720  NA 135 138  K 3.7 3.7  CL 99* 99*  CO2 26  --   BUN 10 14  CREATININE 0.98 1.00  GLUCOSE 141* 187*    Recent Labs Lab 03/22/15 0445 03/28/15 1707 03/28/15 1720  HGB 9.6* 10.1* 11.2*  HCT 30.5* 32.2* 33.0*  WBC 10.5 8.7  --   PLT 289 432*  --    Dg Chest 2 View  03/28/2015   CLINICAL DATA:  Shortness of breath, weakness for 4 days  EXAM: CHEST  2 VIEW  COMPARISON:  03/24/2015  FINDINGS: There is a persistent small right pneumothorax which has decreased in size compared with 03/24/2015.  There is no pleural effusion. There is no left pneumothorax. There is bilateral diffuse chronic interstitial lung disease. Stable cardiomediastinal silhouette. No acute osseous abnormality.  IMPRESSION: 1. Persistent small right pneumothorax which has decreased in size compared with 03/24/2015.   Electronically Signed   By: Kathreen Devoid   On: 03/28/2015 17:33    ASSESSMENT / PLAN:  Acute on chronic hypoxic respiratory failure Small residual right pneumothorax following VATS New diagnosis of UIP with lymphoid hyperplasia - ? Possibility of UIP / IPF flare (though exam does not support) Plan: Continue supplemental O2 as needed to maintain SpO2 > 92%. F/u CTA chest. Repeat CXR in AM. Agree with diuresis as able. Mobilize as able. Defer steroids for now given her normal lung exam findings. Budesonide / Arformoterol in lieu of outpatient Advair as nebs would  provide enhanced delivery while pt is acutely ill. Albuterol PRN. Follow up with Dr. Chase Caller as outpatient (pt thinks this is scheduled for 04/02/15 - if not discharged prior then PCCM will reschedule.)   Montey Hora, [place] Pager: [phone]  or [phone] 03/28/2015, 11:14 PM

## 2015-03-28 NOTE — Telephone Encounter (Signed)
Noted. Will keep appt date and will re-schedule if results come in sooner. Will sign off.

## 2015-03-28 NOTE — ED Notes (Signed)
Pts spouse states that the pts appetite has been diminished and that she has been breathing really fast for the past few days.

## 2015-03-29 ENCOUNTER — Inpatient Hospital Stay (HOSPITAL_COMMUNITY): Payer: 59

## 2015-03-29 ENCOUNTER — Encounter (HOSPITAL_COMMUNITY): Payer: Self-pay | Admitting: Radiology

## 2015-03-29 DIAGNOSIS — R6889 Other general symptoms and signs: Secondary | ICD-10-CM | POA: Insufficient documentation

## 2015-03-29 DIAGNOSIS — R0602 Shortness of breath: Secondary | ICD-10-CM | POA: Insufficient documentation

## 2015-03-29 DIAGNOSIS — D649 Anemia, unspecified: Secondary | ICD-10-CM

## 2015-03-29 LAB — CBC WITH DIFFERENTIAL/PLATELET
BASOS PCT: 0 % (ref 0–1)
Basophils Absolute: 0 10*3/uL (ref 0.0–0.1)
Eosinophils Absolute: 1.1 10*3/uL — ABNORMAL HIGH (ref 0.0–0.7)
Eosinophils Relative: 12 % — ABNORMAL HIGH (ref 0–5)
HEMATOCRIT: 33.3 % — AB (ref 36.0–46.0)
HEMOGLOBIN: 10.5 g/dL — AB (ref 12.0–15.0)
LYMPHS PCT: 21 % (ref 12–46)
Lymphs Abs: 1.9 10*3/uL (ref 0.7–4.0)
MCH: 27.6 pg (ref 26.0–34.0)
MCHC: 31.5 g/dL (ref 30.0–36.0)
MCV: 87.4 fL (ref 78.0–100.0)
Monocytes Absolute: 0.9 10*3/uL (ref 0.1–1.0)
Monocytes Relative: 10 % (ref 3–12)
NEUTROS PCT: 57 % (ref 43–77)
Neutro Abs: 5.3 10*3/uL (ref 1.7–7.7)
Platelets: 421 10*3/uL — ABNORMAL HIGH (ref 150–400)
RBC: 3.81 MIL/uL — ABNORMAL LOW (ref 3.87–5.11)
RDW: 14.4 % (ref 11.5–15.5)
WBC: 9.2 10*3/uL (ref 4.0–10.5)

## 2015-03-29 LAB — GLUCOSE, CAPILLARY
GLUCOSE-CAPILLARY: 369 mg/dL — AB (ref 65–99)
GLUCOSE-CAPILLARY: 306 mg/dL — AB (ref 65–99)
Glucose-Capillary: 123 mg/dL — ABNORMAL HIGH (ref 65–99)
Glucose-Capillary: 115 mg/dL — ABNORMAL HIGH (ref 65–99)
Glucose-Capillary: 120 mg/dL — ABNORMAL HIGH (ref 65–99)
Glucose-Capillary: 295 mg/dL — ABNORMAL HIGH (ref 65–99)

## 2015-03-29 LAB — COMPREHENSIVE METABOLIC PANEL
ALBUMIN: 2.7 g/dL — AB (ref 3.5–5.0)
ALK PHOS: 108 U/L (ref 38–126)
ALT: 17 U/L (ref 14–54)
AST: 19 U/L (ref 15–41)
Anion gap: 11 (ref 5–15)
BILIRUBIN TOTAL: 0.3 mg/dL (ref 0.3–1.2)
BUN: 10 mg/dL (ref 6–20)
CHLORIDE: 101 mmol/L (ref 101–111)
CO2: 25 mmol/L (ref 22–32)
CREATININE: 0.95 mg/dL (ref 0.44–1.00)
Calcium: 9.4 mg/dL (ref 8.9–10.3)
GLUCOSE: 105 mg/dL — AB (ref 65–99)
POTASSIUM: 3.9 mmol/L (ref 3.5–5.1)
Sodium: 137 mmol/L (ref 135–145)
Total Protein: 7.2 g/dL (ref 6.5–8.1)

## 2015-03-29 LAB — PROCALCITONIN

## 2015-03-29 LAB — INFLUENZA PANEL BY PCR (TYPE A & B)
H1N1 flu by pcr: NOT DETECTED
Influenza A By PCR: NEGATIVE
Influenza B By PCR: NEGATIVE

## 2015-03-29 MED ORDER — BUDESONIDE 0.5 MG/2ML IN SUSP
0.5000 mg | Freq: Two times a day (BID) | RESPIRATORY_TRACT | Status: DC
  Administered 2015-03-29 – 2015-04-01 (×5): 0.5 mg via RESPIRATORY_TRACT
  Filled 2015-03-29 (×8): qty 2

## 2015-03-29 MED ORDER — IOHEXOL 350 MG/ML SOLN
80.0000 mL | Freq: Once | INTRAVENOUS | Status: AC | PRN
  Administered 2015-03-29: 80 mL via INTRAVENOUS

## 2015-03-29 MED ORDER — METHYLPREDNISOLONE SODIUM SUCC 40 MG IJ SOLR
40.0000 mg | Freq: Two times a day (BID) | INTRAMUSCULAR | Status: DC
  Administered 2015-03-29: 40 mg via INTRAVENOUS
  Filled 2015-03-29 (×3): qty 1

## 2015-03-29 MED ORDER — ALBUTEROL SULFATE (2.5 MG/3ML) 0.083% IN NEBU: 2.5000 mg | INHALATION_SOLUTION | RESPIRATORY_TRACT | Status: DC | PRN

## 2015-03-29 MED ORDER — DEXTROSE 5 % IV SOLN
2.0000 g | Freq: Two times a day (BID) | INTRAVENOUS | Status: DC
  Administered 2015-03-29 – 2015-03-30 (×5): 2 g via INTRAVENOUS
  Filled 2015-03-29 (×8): qty 2

## 2015-03-29 MED ORDER — METHYLPREDNISOLONE SODIUM SUCC 125 MG IJ SOLR
80.0000 mg | Freq: Three times a day (TID) | INTRAMUSCULAR | Status: DC
  Administered 2015-03-29 – 2015-04-01 (×8): 80 mg via INTRAVENOUS
  Filled 2015-03-29 (×4): qty 1.28
  Filled 2015-03-29: qty 2
  Filled 2015-03-29 (×3): qty 1.28
  Filled 2015-03-29: qty 2
  Filled 2015-03-29 (×2): qty 1.28
  Filled 2015-03-29 (×2): qty 2

## 2015-03-29 MED ORDER — INSULIN ASPART 100 UNIT/ML ~~LOC~~ SOLN
3.0000 [IU] | Freq: Three times a day (TID) | SUBCUTANEOUS | Status: DC
  Administered 2015-03-29 (×2): 3 [IU] via SUBCUTANEOUS

## 2015-03-29 MED ORDER — BUDESONIDE 0.25 MG/2ML IN SUSP
0.2500 mg | Freq: Two times a day (BID) | RESPIRATORY_TRACT | Status: DC
  Filled 2015-03-29 (×2): qty 2

## 2015-03-29 MED ORDER — INSULIN ASPART 100 UNIT/ML ~~LOC~~ SOLN
0.0000 [IU] | Freq: Three times a day (TID) | SUBCUTANEOUS | Status: DC
  Administered 2015-03-29: 8 [IU] via SUBCUTANEOUS
  Administered 2015-03-29: 2 [IU] via SUBCUTANEOUS
  Administered 2015-03-30: 8 [IU] via SUBCUTANEOUS
  Administered 2015-03-30: 11 [IU] via SUBCUTANEOUS
  Administered 2015-03-30: 15 [IU] via SUBCUTANEOUS
  Administered 2015-03-31: 8 [IU] via SUBCUTANEOUS
  Administered 2015-03-31: 3 [IU] via SUBCUTANEOUS
  Administered 2015-03-31: 5 [IU] via SUBCUTANEOUS
  Administered 2015-04-01: 3 [IU] via SUBCUTANEOUS
  Administered 2015-04-01: 5 [IU] via SUBCUTANEOUS
  Administered 2015-04-01 – 2015-04-02 (×2): 3 [IU] via SUBCUTANEOUS

## 2015-03-29 MED ORDER — INSULIN ASPART 100 UNIT/ML ~~LOC~~ SOLN
0.0000 [IU] | Freq: Every day | SUBCUTANEOUS | Status: DC
  Administered 2015-03-29: 4 [IU] via SUBCUTANEOUS
  Administered 2015-03-30 – 2015-04-01 (×2): 2 [IU] via SUBCUTANEOUS

## 2015-03-29 MED ORDER — ARFORMOTEROL TARTRATE 15 MCG/2ML IN NEBU
15.0000 ug | INHALATION_SOLUTION | Freq: Two times a day (BID) | RESPIRATORY_TRACT | Status: DC
  Administered 2015-03-29 – 2015-03-31 (×5): 15 ug via RESPIRATORY_TRACT
  Filled 2015-03-29 (×10): qty 2

## 2015-03-29 MED ORDER — FUROSEMIDE 10 MG/ML IJ SOLN
20.0000 mg | Freq: Once | INTRAMUSCULAR | Status: AC
  Administered 2015-03-29: 20 mg via INTRAVENOUS
  Filled 2015-03-29: qty 2

## 2015-03-29 MED ORDER — BUDESONIDE 0.25 MG/2ML IN SUSP
0.5000 mg | Freq: Two times a day (BID) | RESPIRATORY_TRACT | Status: DC
  Filled 2015-03-29 (×4): qty 4

## 2015-03-29 MED ORDER — VANCOMYCIN HCL IN DEXTROSE 1-5 GM/200ML-% IV SOLN
1000.0000 mg | Freq: Two times a day (BID) | INTRAVENOUS | Status: DC
  Administered 2015-03-29 – 2015-03-31 (×5): 1000 mg via INTRAVENOUS
  Filled 2015-03-29 (×5): qty 200

## 2015-03-29 NOTE — Telephone Encounter (Signed)
Advised patient yesterday to go to ER, checked patient's chart this morning for follow up and saw that patient is currently admitted.  Per MR: Her lung path i sback and she has UIP wit lymphoid hyperplasia.  She definitely needs to see rheum  She needs to come and see me next 10 days - when can you work her in    To [place] for follow up

## 2015-03-29 NOTE — Progress Notes (Signed)
No new complaints No distress @ rest Pulmonary/respiratory history reviewed with patient Recent hospital records reviewed  Filed Vitals:   03/29/15 0418 03/29/15 0500 03/29/15 0742 03/29/15 1100  BP: 97/52  117/70 115/69  Pulse: 90  101 79  Temp: 98.5 F (36.9 C)  97.4 F (36.3 C) 97.8 F (36.6 C)  TempSrc: Oral  Oral Oral  Resp: 32  25 32  Height:      Weight:  81.738 kg (180 lb 3.2 oz)    SpO2: 96%  94% 98%   NAD No JVD Coarse B rales, no wheezes Reg, no M NABS, soft Ext warm without edema, no arthritic changes, no synovitis Neuro intact  BMET    Component Value Date/Time   NA 137 03/29/2015 0255   NA 136 11/17/2013 1327   K 3.9 03/29/2015 0255   K 4.1 11/17/2013 1327   CL 101 03/29/2015 0255   CL 96* 11/17/2013 1327   CO2 25 03/29/2015 0255   CO2 25 11/17/2013 1327   GLUCOSE 105* 03/29/2015 0255   GLUCOSE 95 11/17/2013 1327   BUN 10 03/29/2015 0255   BUN 16 11/17/2013 1327   CREATININE 0.95 03/29/2015 0255   CREATININE 1.24 11/17/2013 1327   CALCIUM 9.4 03/29/2015 0255   CALCIUM 9.6 11/17/2013 1327   GFRNONAA >60 03/29/2015 0255   GFRNONAA 50* 11/17/2013 1327   GFRAA >60 03/29/2015 0255   GFRAA 57* 11/17/2013 1327    CBC    Component Value Date/Time   WBC 9.2 03/29/2015 0255   WBC 9.2 11/17/2013 1327   RBC 3.81* 03/29/2015 0255   RBC 4.76 11/17/2013 1327   HGB 10.5* 03/29/2015 0255   HGB 14.0 11/17/2013 1327   HCT 33.3* 03/29/2015 0255   HCT 41.8 11/17/2013 1327   PLT 421* 03/29/2015 0255   PLT 267 11/17/2013 1327   MCV 87.4 03/29/2015 0255   MCV 88 11/17/2013 1327   MCH 27.6 03/29/2015 0255   MCH 29.4 11/17/2013 1327   MCHC 31.5 03/29/2015 0255   MCHC 33.4 11/17/2013 1327   RDW 14.4 03/29/2015 0255   RDW 15.0* 11/17/2013 1327   LYMPHSABS 1.9 03/29/2015 0255   LYMPHSABS 2.1 11/17/2013 1327   MONOABS 0.9 03/29/2015 0255   MONOABS 0.8 11/17/2013 1327   EOSABS 1.1* 03/29/2015 0255   EOSABS 0.5 11/17/2013 1327   BASOSABS 0.0 03/29/2015  0255   BASOSABS 0.1 11/17/2013 1327    CT chest 5/19: small R PTX. Extensive changes of fibrosis and pneumonitis - increased infiltrates compared to CT chest in April  CXR: small stable R ptx. [place] B opacities  IMPRESSION: Recent biopsy proven dx of IPF/UIP Mildly elevated RF of uncertain significance - doubt RA Increasing bilateral AS dz - PNA vs edema vs acute exacerbation IPF (Favor exacerbation) DM 2, presently controlled  PLAN: I shared the bx results with pt I spoke of the implications of the dx of IPF Cont current abx for HCAP Cont diuresis to extent permitted by BP and renal function  Monitor electrolytes closely to avoid complications such as hypokalemia Initiate systemic steroids (methylpred 80 mg IV q 12 hrs) for presumed exacerbation of IPF SSI adjusted in light of anticipated increased insulin needs while on steroids Care mgmt consultation requested to seek approval of pirfenidone Counseled re: need for pulm rehab, wt loss, optimal DM control Ultimately might need lung transplant eval although the hx of RCC might preclude transplant  Im not entirely sure this was the dx of her resected "renal mass"  Merton Border, MD ; [facility] [phone].  After 5:30 PM or weekends, call [phone]

## 2015-03-29 NOTE — Progress Notes (Addendum)
Met with patient t dsicuss. DR Alva Garnet has already seen patient. I d/w him. This is courtesy drop in   PLAN She will thinks about these 2 drugs and get back and then I wil initiate paper work in office Currently Rx AE-IPF: increase steroids Check PCT and low threshold to dc antibotic Agree with diuresis - repeat lasix x1 Discussed need for rehab and potentially transplant candidate  Also discussed - as main but not complete talking points   Both drugs OFEV and Esbriet only slow down progression   -apprx 228m of lung function in 1 year  - helps  1 out of 6 patients by keeping them more stable or preventing significant progression  - this means extension in quality of life but no difference in symptoms  - no study directly compares the 2 drugs but efficacy roughly equal at 1 year time point   - OFEV   - made by [employer] - - time to first exacerbation possibly reduced in one trial but not in another - twice daily, no titration, more convenient dosing  - no need for sunscreen  - high chance of mild diarrhea but low chance of significant diarrhea needing to stop medication.   - Rx diarrhea with lomotil - slight increase in heart attack risk and theoretical increase in bleeding risk,   - need monthly blood work for 3 months and then every 6 months - monitor liver function   - ESBRIET  - made by [employer] - 3 pill three times daily with /after food - Need to wean sunscreen  - Some chance of nausea and anorexia with small chance for diarrhea  - no known heart attack risk - no known bleeding risk,   - need monthly blood work for 6 months - monitor liver function - possible mortality benefit in pooled analysis  - larger world wide experience because it was launched in [place]  Disclosure: Dr RChase Callerhas participated in trials with Esbriet and has been compensated by [employer]/Carrolyn Leigh   Dr. MBrand Males M.D., F.C.C.P Pulmonary and  Critical Care Medicine Staff [facility]   03/29/2015 12:08 PM

## 2015-03-29 NOTE — Progress Notes (Signed)
[facility] PROGRESS NOTE  Joann [name] TFT:[id number] DOB: 03/21/1961 DOA: 03/28/2015 PCP: Velna Hatchet, MD  Assessment/Plan: 1. Acute respiratory failure with hypoxia   -due to HCAP and possibly progression of UIP    -Continue Vanc/Cefepime   - clinically do not see evidence of volume overload   -Pulm consulting, does she need steroids   -FU FLu PCR   -brovana/pulmicort  2. 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 Diabetes mellitus type 2   - hold metformin,    - continue Lantus with sliding scale coverage.  3. Chronic anemia  -mild, stable, monitor  4. Hypertension -  -stable  5. Hypothyroidism -continue synthroid  DVT proph: lovenox  Code Status: Full COde Family Communication:none at bedside Disposition Plan: home when stable, [place] to tele later   Consultants:  Pulm  HPI/Subjective: Still complains of Dyspnea esp with exertion  Objective: Filed Vitals:   03/29/15 0742  BP: 117/70  Pulse: 101  Temp: 97.4 F (36.3 C)  Resp: 25    Intake/Output Summary (Last 24 hours) at 03/29/15 0802 Last data filed at 03/29/15 0500  Gross per 24 hour  Intake      0 ml  Output   1050 ml  Net  -1050 ml   Filed Weights   03/28/15 2123 03/29/15 0500  Weight: 83.87 kg (184 lb 14.4 oz) 81.738 kg (180 lb 3.2 oz)    Exam:   General: AAOX3, no distress  Cardiovascular: S1S2/RRR  Respiratory: coarse crackles throughout  Abdomen: soft, NT, BS present  Musculoskeletal: no edema c/c  Data Reviewed: Basic Metabolic Panel:  Recent Labs Lab 03/28/15 1720 03/28/15 2241 03/29/15 0255  NA 138  --  137  K 3.7  --  3.9  CL 99*  --  101  CO2  --   --  25  GLUCOSE 187*  --  105*  BUN 14  --  10  CREATININE 1.00 0.94 0.95  CALCIUM  --   --  9.4   Liver Function Tests:  Recent Labs Lab 03/29/15 0255  AST 19  ALT 17  ALKPHOS 108  BILITOT 0.3  PROT 7.2  ALBUMIN 2.7*   No  results for input(s): LIPASE, AMYLASE in the last 168 hours. No results for input(s): AMMONIA in the last 168 hours. CBC:  Recent Labs Lab 03/28/15 1707 03/28/15 1720 03/29/15 0255  WBC 8.7  --  9.2  NEUTROABS 5.2  --  5.3  HGB 10.1* 11.2* 10.5*  HCT 32.2* 33.0* 33.3*  MCV 87.3  --  87.4  PLT 432*  --  421*   Cardiac Enzymes:  Recent Labs Lab 03/28/15 2241  TROPONINI <0.03   BNP (last 3 results)  Recent Labs  02/27/15 1843 02/28/15 1152 03/28/15 2241  BNP 27.7 66.7 17.3    ProBNP (last 3 results) No results for input(s): PROBNP in the last 8760 hours.  CBG:  Recent Labs Lab 03/24/15 2122 03/25/15 0622 03/25/15 1112 03/25/15 1624 03/29/15 0034  GLUCAP 127* 130* 240* 136* 123*    Recent Results (from the past 240 hour(s))  Surgical pcr screen     Status: Abnormal   Collection Time: 03/21/15  6:34 AM  Result Value Ref Range Status   MRSA, PCR NEGATIVE NEGATIVE Final   Staphylococcus aureus POSITIVE (A) NEGATIVE Final    Comment:        The Xpert SA Assay (FDA approved for NASAL specimens  in patients over 45 years of age), is one component of a comprehensive surveillance program.  Test performance has been validated by [facility] for patients greater than or equal to 59 year old. It is not intended to diagnose infection nor to guide or monitor treatment.      Studies: Dg Chest 2 View  03/28/2015   CLINICAL DATA:  Shortness of breath, weakness for 4 days  EXAM: CHEST  2 VIEW  COMPARISON:  03/24/2015  FINDINGS: There is a persistent small right pneumothorax which has decreased in size compared with 03/24/2015.  There is no pleural effusion. There is no left pneumothorax. There is bilateral diffuse chronic interstitial lung disease. Stable cardiomediastinal silhouette. No acute osseous abnormality.  IMPRESSION: 1. Persistent small right pneumothorax which has decreased in size compared with 03/24/2015.   Electronically Signed   By: Kathreen Devoid   On:  03/28/2015 17:33   Ct Angio Chest Pe W/cm &/or Wo Cm  03/29/2015   CLINICAL DATA:  Acute onset of shortness of breath. Initial encounter.  EXAM: CT ANGIOGRAPHY CHEST WITH CONTRAST  TECHNIQUE: Multidetector CT imaging of the chest was performed using the standard protocol during bolus administration of intravenous contrast. Multiplanar CT image reconstructions and MIPs were obtained to evaluate the vascular anatomy.  CONTRAST:  58mL OMNIPAQUE IOHEXOL 350 MG/ML SOLN  COMPARISON:  High-resolution CT of the chest performed 02/28/2015, and chest radiograph performed 03/28/2015  FINDINGS: There is no evidence of significant pulmonary embolus. Evaluation for pulmonary embolus is suboptimal due to surrounding airspace opacification.  There has been rapid interval progression of scattered bilateral airspace opacification, which is thought to be superimposed on the patient's underlying interstitial lung disease. This most likely reflects an acute superimposed pneumonia; this degree of rapid progression of interstitial lung disease would be highly unusual. Underlying extensive areas of peripheral subpleural and peribronchovascular reticulation are again noted, with marked thickening of the peribronchovascular interstitium, areas of ground-glass attenuation and mild honeycombing, and cylindrical and mild varicose bronchiectasis. A trace right-sided pneumothorax is again noted, reflecting recent biopsy. No pleural effusion is seen.  Scattered prominent bilateral hilar, subcarinal, azygoesophageal recess, periaortic and right paratracheal nodes are seen, measuring up to 1.4 cm in short axis. Trace air within the epicardial fat likely reflects the adjacent trace pneumothorax. Mild adjacent high attenuation may reflect post biopsy changes. No pericardial effusion is identified. The great vessels are grossly unremarkable in appearance. The thyroid gland is diminutive and grossly unremarkable. No axillary lymphadenopathy is seen.   The visualized portions of the liver and spleen are unremarkable. The patient is status post cholecystectomy, with clips noted along the gallbladder fossa. The visualized portions of the pancreas and adrenal glands are within normal limits.  No acute osseous abnormalities are seen.  Review of the MIP images confirms the above findings.  IMPRESSION: 1. No evidence of significant pulmonary embolus. 2. Rapid interval progression of scattered bilateral airspace opacification. This is thought to be superimposed on the underlying interstitial lung disease, and most likely reflects an acute superimposed pneumonia. This degree of rapid progression in interstitial lung disease would be highly unusual. 3. Underlying interstitial lung disease again noted. As noted on high-resolution CT, this most likely reflects usual interstitial pneumonia, though severe fibrotic nonspecific interstitial pneumonia (NSIP) could have a similar appearance. 4. Trace right-sided pneumothorax reflects recent biopsy. 5. Bilateral hilar and mediastinal lymphadenopathy again noted.   Electronically Signed   By: Garald Balding M.D.   On: 03/29/2015 01:37   Dg Chest  Port 1 View  03/29/2015   CLINICAL DATA:  Respiratory failure.  EXAM: PORTABLE CHEST - 1 VIEW  COMPARISON:  Chest CT earlier this day. Radiographs obtained yesterday per  FINDINGS: Small right lateral pneumothorax, unchanged. Lower lung volumes from prior, question mild progression of perihilar airspace disease, versus differences in technique. Underlying chronic lung disease again seen. Mediastinal contours are unchanged.  IMPRESSION: 1. Small right pneumothorax is unchanged from prior. 2. Question progressive perihilar airspace disease versus related to lower lung volumes.   Electronically Signed   By: Jeb Levering M.D.   On: 03/29/2015 04:58    Scheduled Meds: . arformoterol  15 mcg Nebulization BID  . budesonide  0.5 mg Nebulization BID  . ceFEPime (MAXIPIME) IV  2 g  Intravenous Q12H  . dicyclomine  20 mg Oral Q6H  . DULoxetine  60 mg Oral Daily  . enoxaparin (LOVENOX) injection  40 mg Subcutaneous Q24H  . guaiFENesin  600 mg Oral BID  . insulin aspart  0-9 Units Subcutaneous TID WC  . insulin glargine  50 Units Subcutaneous QHS  . levothyroxine  200 mcg Oral QAC breakfast  . pantoprazole  40 mg Oral Daily  . sodium chloride  3 mL Intravenous Q12H  . topiramate  25 mg Oral BID  . traZODone  75 mg Oral QHS  . vancomycin  1,000 mg Intravenous Q12H  . vitamin E  800 Units Oral Daily   Continuous Infusions:  Antibiotics Given (last 72 hours)    Date/Time Action Medication Dose Rate   03/29/15 0400 Given   ceFEPIme (MAXIPIME) 2 g in dextrose 5 % 50 mL IVPB 2 g 100 mL/hr   03/29/15 0500 Given   vancomycin (VANCOCIN) IVPB 1000 mg/200 mL premix 1,000 mg 200 mL/hr      Principal Problem:   Acute respiratory failure with hypoxia Active Problems:   HTN (hypertension)   ILD (interstitial lung disease)   Diabetes mellitus type 2, controlled   Chronic anemia   Hypertension   Hypothyroidism   Increased oxygen demand   Shortness of breath    Time spent: 10min    [facility] Pager [phone]. If 7PM-7AM, please contact night-coverage at [email or web address], password [facility] 03/29/2015, 8:02 AM  LOS: 1 day

## 2015-03-29 NOTE — Progress Notes (Signed)
ANTIBIOTIC CONSULT NOTE - INITIAL  Pharmacy Consult for vancomycin and cefepime Indication: pneumonia  Allergies  Allergen Reactions  . Peanut-Containing Drug Products Shortness Of Breath    Pecans, walnuts as well  . Sulfa Antibiotics Hives and Shortness Of Breath  . Betadine [Povidone Iodine] Rash  . Latex Rash    Patient Measurements: Height: 5' (152.4 cm) Weight: 184 lb 14.4 oz (83.87 kg) IBW/kg (Calculated) : 45.5  Vital Signs: Temp: 98.5 F (36.9 C) (05/20 0035) Temp Source: Oral (05/20 0035) BP: 111/62 mmHg (05/20 0035) Pulse Rate: 86 (05/20 0035)  Labs:  Recent Labs  03/28/15 1707 03/28/15 1720 03/28/15 2241  WBC 8.7  --   --   HGB 10.1* 11.2*  --   PLT 432*  --   --   CREATININE  --  1.00 0.94   Estimated Creatinine Clearance: 65.8 mL/min (by C-G formula based on Cr of 0.94).   Microbiology: Recent Results (from the past 720 hour(s))  Surgical pcr screen     Status: Abnormal   Collection Time: 03/21/15  6:34 AM  Result Value Ref Range Status   MRSA, PCR NEGATIVE NEGATIVE Final   Staphylococcus aureus POSITIVE (A) NEGATIVE Final    Comment:        The Xpert SA Assay (FDA approved for NASAL specimens in patients over 44 years of age), is one component of a comprehensive surveillance program.  Test performance has been validated by [facility] for patients greater than or equal to 86 year old. It is not intended to diagnose infection nor to guide or monitor treatment.     Medical History: Past Medical History  Diagnosis Date  . Asthma   . Diabetes mellitus   . Headache(784.0)     MIGRAINES  . Fibromyalgia   . GERD (gastroesophageal reflux disease)   . IBS (irritable bowel syndrome)   . Renal mass, right   . IC (interstitial cystitis)   . Frequency of urination   . Anemia   . Swelling of both lower extremities     TAKES MAXIDE FOR SWELLING (DENIES HBP)  . Hypothyroidism   . Depression   . Cancer     kidney  . Hypertension   .  Wears glasses   . Sleep apnea     uses a cpap  . Pulmonary fibrosis   . Pneumonia   . Fatty liver   . Oxygen dependent     2 Liters at rest, 3 liters with activity    Medications:  Prescriptions prior to admission  Medication Sig Dispense Refill Last Dose  . albuterol (PROVENTIL HFA;VENTOLIN HFA) 108 (90 BASE) MCG/ACT inhaler Inhale 2 puffs into the lungs every 6 (six) hours as needed. For shortness of breath   2 weeks  . ALPRAZolam (XANAX) 0.5 MG tablet Take 0.5 mg by mouth daily as needed for anxiety.    3 weeks  . amLODipine (NORVASC) 5 MG tablet Take 5 mg by mouth every morning.    03/28/2015 at Unknown time  . cholecalciferol (VITAMIN D) 1000 UNITS tablet Take 1,000 Units by mouth daily.   03/28/2015 at Unknown time  . dicyclomine (BENTYL) 20 MG tablet Take 20 mg by mouth every 6 (six) hours.   03/27/2015 at Unknown time  . DULoxetine (CYMBALTA) 60 MG capsule Take 60 mg by mouth daily.   03/28/2015 at Unknown time  . esomeprazole (NEXIUM) 40 MG capsule Take 40 mg by mouth 2 (two) times daily before a meal.    03/28/2015  at Unknown time  . estrogens, conjugated, (PREMARIN) 0.625 MG tablet Take 0.625 mg by mouth daily.    03/28/2015 at Unknown time  . Fluticasone-Salmeterol (ADVAIR) 100-50 MCG/DOSE AEPB Inhale 1 puff into the lungs as needed (shortness of breath).    3 weeks  . guaiFENesin (MUCINEX) 600 MG 12 hr tablet Take 1 tablet (600 mg total) by mouth 2 (two) times daily.   03/27/2015 at Unknown time  . indomethacin (INDOCIN) 25 MG capsule Take 25 mg by mouth 2 (two) times daily with a meal.   03/28/2015 at Unknown time  . insulin aspart (NOVOLOG) 100 UNIT/ML injection Inject 15 Units into the skin daily as needed for high blood sugar.    03/27/2015 at Unknown time  . insulin glargine (LANTUS) 100 UNIT/ML injection Inject 50 Units into the skin at bedtime.    03/27/2015 at Unknown time  . levothyroxine (SYNTHROID, LEVOTHROID) 200 MCG tablet Take 200 mcg by mouth daily before breakfast.    03/28/2015 at Unknown time  . meclizine (ANTIVERT) 25 MG tablet Take 25 mg by mouth 3 (three) times daily as needed for dizziness.   PRN  . metFORMIN (GLUCOPHAGE-XR) 500 MG 24 hr tablet Take 1,000 mg by mouth 2 (two) times daily.   03/28/2015 at Unknown time  . ondansetron (ZOFRAN) 4 MG tablet Take 4 mg by mouth every 8 (eight) hours as needed for nausea or vomiting.   PRN  . oxyCODONE (OXY IR/ROXICODONE) 5 MG immediate release tablet Take 1-2 tablets (5-10 mg total) by mouth every 4 (four) hours as needed for severe pain. 30 tablet 0 03/27/2015 at Unknown time  . topiramate (TOPAMAX) 25 MG tablet Take 25 mg by mouth 2 (two) times daily.   03/28/2015 at Unknown time  . traZODone (DESYREL) 150 MG tablet Take 75 mg by mouth at bedtime.   03/27/2015 at Unknown time  . triamterene-hydrochlorothiazide (MAXZIDE-25) 37.5-25 MG per tablet Take 1 tablet by mouth every evening.   03/27/2015 at Unknown time  . vitamin E 400 UNIT capsule Take 800 Units by mouth daily.   03/28/2015 at Unknown time   Scheduled:  . amLODipine  5 mg Oral q morning - 10a  . arformoterol  15 mcg Nebulization BID  . budesonide  0.5 mg Nebulization BID  . dicyclomine  20 mg Oral Q6H  . DULoxetine  60 mg Oral Daily  . enoxaparin (LOVENOX) injection  40 mg Subcutaneous Q24H  . guaiFENesin  600 mg Oral BID  . insulin aspart  0-9 Units Subcutaneous TID WC  . insulin glargine  50 Units Subcutaneous QHS  . levothyroxine  200 mcg Oral QAC breakfast  . pantoprazole  40 mg Oral Daily  . sodium chloride  3 mL Intravenous Q12H  . topiramate  25 mg Oral BID  . traZODone  75 mg Oral QHS  . triamterene-hydrochlorothiazide  1 tablet Oral QPM  . vitamin E  800 Units Oral Daily     Assessment: 54yo female c/o SOB since lung bx 1wk ago, on 2L O2 at home and had to increase to 3L, CT negative for PE but shows likely superimposed PNA, to begin IV ABX.  Goal of Therapy:  Vancomycin trough level 15-20 mcg/ml  Plan:  Will begin vancomycin 1000mg   IV Q12H and cefepime 2g IV Q12H and monitor CBC, Cx, levels prn.  Wynona Neat, PharmD, BCPS  03/29/2015,2:42 AM

## 2015-03-29 NOTE — Progress Notes (Signed)
Esbriet (pirfenidone) will require prior auth before [facility] will provide information re copay.  Prior auth form placed in shadow chart and marked URGENT.  Pt fills scripts @ [facility] and pharmacist will need 24 hr advance notice to acquire medication.

## 2015-03-29 NOTE — Telephone Encounter (Signed)
Will keep in my box to follow to get pt in to be seen after hospital d/c.

## 2015-03-30 ENCOUNTER — Inpatient Hospital Stay (HOSPITAL_COMMUNITY): Payer: 59

## 2015-03-30 DIAGNOSIS — J9690 Respiratory failure, unspecified, unspecified whether with hypoxia or hypercapnia: Secondary | ICD-10-CM | POA: Insufficient documentation

## 2015-03-30 DIAGNOSIS — E119 Type 2 diabetes mellitus without complications: Secondary | ICD-10-CM

## 2015-03-30 DIAGNOSIS — I10 Essential (primary) hypertension: Secondary | ICD-10-CM

## 2015-03-30 DIAGNOSIS — J9601 Acute respiratory failure with hypoxia: Secondary | ICD-10-CM

## 2015-03-30 DIAGNOSIS — J9621 Acute and chronic respiratory failure with hypoxia: Secondary | ICD-10-CM

## 2015-03-30 DIAGNOSIS — J84112 Idiopathic pulmonary fibrosis: Principal | ICD-10-CM

## 2015-03-30 LAB — CBC
HEMATOCRIT: 33.4 % — AB (ref 36.0–46.0)
Hemoglobin: 10.7 g/dL — ABNORMAL LOW (ref 12.0–15.0)
MCH: 27.5 pg (ref 26.0–34.0)
MCHC: 32 g/dL (ref 30.0–36.0)
MCV: 85.9 fL (ref 78.0–100.0)
Platelets: 426 10*3/uL — ABNORMAL HIGH (ref 150–400)
RBC: 3.89 MIL/uL (ref 3.87–5.11)
RDW: 13.9 % (ref 11.5–15.5)
WBC: 9 10*3/uL (ref 4.0–10.5)

## 2015-03-30 LAB — GLUCOSE, CAPILLARY
GLUCOSE-CAPILLARY: 280 mg/dL — AB (ref 65–99)
GLUCOSE-CAPILLARY: 377 mg/dL — AB (ref 65–99)
GLUCOSE-CAPILLARY: 305 mg/dL — AB (ref 65–99)
GLUCOSE-CAPILLARY: 306 mg/dL — AB (ref 65–99)
Glucose-Capillary: 247 mg/dL — ABNORMAL HIGH (ref 65–99)

## 2015-03-30 LAB — BASIC METABOLIC PANEL
ANION GAP: 11 (ref 5–15)
BUN: 16 mg/dL (ref 6–20)
CO2: 25 mmol/L (ref 22–32)
Calcium: 8.8 mg/dL — ABNORMAL LOW (ref 8.9–10.3)
Chloride: 98 mmol/L — ABNORMAL LOW (ref 101–111)
Creatinine, Ser: 1 mg/dL (ref 0.44–1.00)
GFR calc Af Amer: >60 mL/min (ref >60)
Glucose, Bld: 277 mg/dL — ABNORMAL HIGH (ref 65–99)
Potassium: 4.1 mmol/L (ref 3.5–5.1)
SODIUM: 134 mmol/L — AB (ref 135–145)

## 2015-03-30 MED ORDER — INSULIN GLARGINE 100 UNIT/ML ~~LOC~~ SOLN
65.0000 [IU] | Freq: Every day | SUBCUTANEOUS | Status: DC
  Administered 2015-03-30 – 2015-04-01 (×3): 65 [IU] via SUBCUTANEOUS
  Filled 2015-03-30 (×4): qty 0.65

## 2015-03-30 MED ORDER — INSULIN ASPART 100 UNIT/ML ~~LOC~~ SOLN
6.0000 [IU] | Freq: Three times a day (TID) | SUBCUTANEOUS | Status: DC
  Administered 2015-03-30 – 2015-04-02 (×9): 6 [IU] via SUBCUTANEOUS

## 2015-03-30 NOTE — Progress Notes (Signed)
[facility] PROGRESS NOTE  Joann [name] BTD:[id number] DOB: Nov 24, 1960 DOA: 03/28/2015 PCP: Velna Hatchet, MD  Assessment/Plan: 1. Acute respiratory failure with hypoxia   -due to HCAP and exacerbation of UIP    - Continue Vanc/Cefepime day2, change to Po Abx tomorrow if cultures negative   - clinically do not see evidence of volume overload   -  Pulm following, now on IV solumedrol and plan to start Esbreit, pre-auth pending   -FLu PCR negative   -brovana/pulmicort   -keep in SDU today, Tx tomorrow  2. 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 Diabetes mellitus type 2   - hold metformin,  Uncontrolled due to steroids  - incraese Lantus and meal coverage, sliding scale coverage.  3. Chronic anemia  -mild, stable, monitor  4. Hypertension -  -stable  5. Hypothyroidism -continue synthroid  OOB, Pt eval  DVT proph: lovenox  Code Status: Full COde Family Communication:none at bedside Disposition Plan: home when stable, [place] to tele later   Consultants:  Pulm  HPI/Subjective: Feels a little better, no chest pain, some cough  Objective: Filed Vitals:   03/30/15 1124  BP: 123/66  Pulse: 79  Temp: 98 F (36.7 C)  Resp: 30    Intake/Output Summary (Last 24 hours) at 03/30/15 1138 Last data filed at 03/30/15 0951  Gross per 24 hour  Intake    850 ml  Output   1225 ml  Net   -375 ml   Filed Weights   03/28/15 2123 03/29/15 0500  Weight: 83.87 kg (184 lb 14.4 oz) 81.738 kg (180 lb 3.2 oz)    Exam:   General: AAOX3, no distress  Cardiovascular: S1S2/RRR  Respiratory: coarse crackles throughout  Abdomen: soft, NT, BS present  Musculoskeletal: no edema c/c  Data Reviewed: Basic Metabolic Panel:  Recent Labs Lab 03/28/15 1720 03/28/15 2241 03/29/15 0255 03/30/15 0807  NA 138  --  137 134*  K 3.7  --  3.9 4.1  CL 99*  --  101 98*  CO2  --   --  25 25  GLUCOSE 187*   --  105* 277*  BUN 14  --  10 16  CREATININE 1.00 0.94 0.95 1.00  CALCIUM  --   --  9.4 8.8*   Liver Function Tests:  Recent Labs Lab 03/29/15 0255  AST 19  ALT 17  ALKPHOS 108  BILITOT 0.3  PROT 7.2  ALBUMIN 2.7*   No results for input(s): LIPASE, AMYLASE in the last 168 hours. No results for input(s): AMMONIA in the last 168 hours. CBC:  Recent Labs Lab 03/28/15 1707 03/28/15 1720 03/29/15 0255 03/30/15 0807  WBC 8.7  --  9.2 9.0  NEUTROABS 5.2  --  5.3  --   HGB 10.1* 11.2* 10.5* 10.7*  HCT 32.2* 33.0* 33.3* 33.4*  MCV 87.3  --  87.4 85.9  PLT 432*  --  421* 426*   Cardiac Enzymes:  Recent Labs Lab 03/28/15 2241  TROPONINI <0.03   BNP (last 3 results)  Recent Labs  02/27/15 1843 02/28/15 1152 03/28/15 2241  BNP 27.7 66.7 17.3    ProBNP (last 3 results) No results for input(s): PROBNP in the last 8760 hours.  CBG:  Recent Labs Lab 03/29/15 1135 03/29/15 1620 03/29/15 2046 03/29/15 2215 03/30/15 0744  GLUCAP 120* 295* 369* 306* 280*    Recent Results (from the past 240 hour(s))  Surgical pcr  screen     Status: Abnormal   Collection Time: 03/21/15  6:34 AM  Result Value Ref Range Status   MRSA, PCR NEGATIVE NEGATIVE Final   Staphylococcus aureus POSITIVE (A) NEGATIVE Final    Comment:        The Xpert SA Assay (FDA approved for NASAL specimens in patients over 53 years of age), is one component of a comprehensive surveillance program.  Test performance has been validated by [facility] for patients greater than or equal to 69 year old. It is not intended to diagnose infection nor to guide or monitor treatment.      Studies: Dg Chest 2 View  03/28/2015   CLINICAL DATA:  Shortness of breath, weakness for 4 days  EXAM: CHEST  2 VIEW  COMPARISON:  03/24/2015  FINDINGS: There is a persistent small right pneumothorax which has decreased in size compared with 03/24/2015.  There is no pleural effusion. There is no left pneumothorax.  There is bilateral diffuse chronic interstitial lung disease. Stable cardiomediastinal silhouette. No acute osseous abnormality.  IMPRESSION: 1. Persistent small right pneumothorax which has decreased in size compared with 03/24/2015.   Electronically Signed   By: Kathreen Devoid   On: 03/28/2015 17:33   Ct Angio Chest Pe W/cm &/or Wo Cm  03/29/2015   CLINICAL DATA:  Acute onset of shortness of breath. Initial encounter.  EXAM: CT ANGIOGRAPHY CHEST WITH CONTRAST  TECHNIQUE: Multidetector CT imaging of the chest was performed using the standard protocol during bolus administration of intravenous contrast. Multiplanar CT image reconstructions and MIPs were obtained to evaluate the vascular anatomy.  CONTRAST:  44mL OMNIPAQUE IOHEXOL 350 MG/ML SOLN  COMPARISON:  High-resolution CT of the chest performed 02/28/2015, and chest radiograph performed 03/28/2015  FINDINGS: There is no evidence of significant pulmonary embolus. Evaluation for pulmonary embolus is suboptimal due to surrounding airspace opacification.  There has been rapid interval progression of scattered bilateral airspace opacification, which is thought to be superimposed on the patient's underlying interstitial lung disease. This most likely reflects an acute superimposed pneumonia; this degree of rapid progression of interstitial lung disease would be highly unusual. Underlying extensive areas of peripheral subpleural and peribronchovascular reticulation are again noted, with marked thickening of the peribronchovascular interstitium, areas of ground-glass attenuation and mild honeycombing, and cylindrical and mild varicose bronchiectasis. A trace right-sided pneumothorax is again noted, reflecting recent biopsy. No pleural effusion is seen.  Scattered prominent bilateral hilar, subcarinal, azygoesophageal recess, periaortic and right paratracheal nodes are seen, measuring up to 1.4 cm in short axis. Trace air within the epicardial fat likely reflects the  adjacent trace pneumothorax. Mild adjacent high attenuation may reflect post biopsy changes. No pericardial effusion is identified. The great vessels are grossly unremarkable in appearance. The thyroid gland is diminutive and grossly unremarkable. No axillary lymphadenopathy is seen.  The visualized portions of the liver and spleen are unremarkable. The patient is status post cholecystectomy, with clips noted along the gallbladder fossa. The visualized portions of the pancreas and adrenal glands are within normal limits.  No acute osseous abnormalities are seen.  Review of the MIP images confirms the above findings.  IMPRESSION: 1. No evidence of significant pulmonary embolus. 2. Rapid interval progression of scattered bilateral airspace opacification. This is thought to be superimposed on the underlying interstitial lung disease, and most likely reflects an acute superimposed pneumonia. This degree of rapid progression in interstitial lung disease would be highly unusual. 3. Underlying interstitial lung disease again noted. As noted on  high-resolution CT, this most likely reflects usual interstitial pneumonia, though severe fibrotic nonspecific interstitial pneumonia (NSIP) could have a similar appearance. 4. Trace right-sided pneumothorax reflects recent biopsy. 5. Bilateral hilar and mediastinal lymphadenopathy again noted.   Electronically Signed   By: Garald Balding M.D.   On: 03/29/2015 01:37   Dg Chest Port 1 View  03/30/2015   CLINICAL DATA:  Respiratory failure.  Followup right pneumothorax.  EXAM: PORTABLE CHEST - 1 VIEW  COMPARISON:  03/29/2015.  FINDINGS: Decreased size of a small right lateral pneumothorax. No significant change in enlargement of the cardiac silhouette, prominence of the interstitial markings and airspace opacity in the majority at the left lung. Thoracic spine degenerative changes and cervical spine fixation hardware.  IMPRESSION: 1. Approximately 5% right lateral pneumothorax with  mild improvement. 2. No significant change in diffuse left lung alveolar edema or pneumonia. 3. Stable cardiomegaly and interstitial lung disease.   Electronically Signed   By: Claudie Revering M.D.   On: 03/30/2015 10:31   Dg Chest Port 1 View  03/29/2015   CLINICAL DATA:  Respiratory failure.  EXAM: PORTABLE CHEST - 1 VIEW  COMPARISON:  Chest CT earlier this day. Radiographs obtained yesterday per  FINDINGS: Small right lateral pneumothorax, unchanged. Lower lung volumes from prior, question mild progression of perihilar airspace disease, versus differences in technique. Underlying chronic lung disease again seen. Mediastinal contours are unchanged.  IMPRESSION: 1. Small right pneumothorax is unchanged from prior. 2. Question progressive perihilar airspace disease versus related to lower lung volumes.   Electronically Signed   By: Jeb Levering M.D.   On: 03/29/2015 04:58    Scheduled Meds: . arformoterol  15 mcg Nebulization BID  . budesonide (PULMICORT) nebulizer solution  0.5 mg Nebulization BID  . ceFEPime (MAXIPIME) IV  2 g Intravenous Q12H  . dicyclomine  20 mg Oral Q6H  . DULoxetine  60 mg Oral Daily  . enoxaparin (LOVENOX) injection  40 mg Subcutaneous Q24H  . guaiFENesin  600 mg Oral BID  . insulin aspart  0-15 Units Subcutaneous TID WC  . insulin aspart  0-5 Units Subcutaneous QHS  . insulin aspart  6 Units Subcutaneous TID WC  . insulin glargine  65 Units Subcutaneous QHS  . levothyroxine  200 mcg Oral QAC breakfast  . methylPREDNISolone (SOLU-MEDROL) injection  80 mg Intravenous 3 times per day  . pantoprazole  40 mg Oral Daily  . sodium chloride  3 mL Intravenous Q12H  . topiramate  25 mg Oral BID  . traZODone  75 mg Oral QHS  . vancomycin  1,000 mg Intravenous Q12H  . vitamin E  800 Units Oral Daily   Continuous Infusions:  Antibiotics Given (last 72 hours)    Date/Time Action Medication Dose Rate   03/29/15 0400 Given   ceFEPIme (MAXIPIME) 2 g in dextrose 5 % 50 mL IVPB  2 g 100 mL/hr   03/29/15 0500 Given   vancomycin (VANCOCIN) IVPB 1000 mg/200 mL premix 1,000 mg 200 mL/hr   03/29/15 1000 Given   ceFEPIme (MAXIPIME) 2 g in dextrose 5 % 50 mL IVPB 2 g 100 mL/hr   03/29/15 1640 Given   vancomycin (VANCOCIN) IVPB 1000 mg/200 mL premix 1,000 mg 200 mL/hr   03/30/15 0002 Given   ceFEPIme (MAXIPIME) 2 g in dextrose 5 % 50 mL IVPB 2 g 100 mL/hr   03/30/15 0504 Given   vancomycin (VANCOCIN) IVPB 1000 mg/200 mL premix 1,000 mg 200 mL/hr   03/30/15 1031 Given  ceFEPIme (MAXIPIME) 2 g in dextrose 5 % 50 mL IVPB 2 g 100 mL/hr      Principal Problem:   Acute respiratory failure with hypoxia Active Problems:   HTN (hypertension)   ILD (interstitial lung disease)   Diabetes mellitus type 2, controlled   Chronic anemia   Hypertension   Hypothyroidism   Increased oxygen demand   Shortness of breath    Time spent: 35min    [facility] Pager [phone]. If 7PM-7AM, please contact night-coverage at [email or web address], password [facility] 03/30/2015, 11:38 AM  LOS: 2 days

## 2015-03-30 NOTE — Progress Notes (Signed)
Name:Joann [name] DOB:[birth year]   ADMISSION DATE: 03/28/2015 CONSULTATION DATE: 03/28/2015  REFERRING MD : Hal Hope  CHIEF COMPLAINT: SOB  BRIEF PATIENT DESCRIPTION: 54 y.o. F with recently diagnosed UIP with lymphoid hyperplasia (via VATS bx on 03/21/15), brought to [facility] ED with persistent SOB and increasing O2 requirements. PCCM consulted for recs.  Subjective: Nurse ready to put her on bedpan. Saturating 97% on 2L but nurse says she desats easily with activity. They both deny acute concerns this AM.   Filed Vitals:   03/30/15 0739 03/30/15 0749 03/30/15 1000 03/30/15 1124  BP:  119/65  123/66  Pulse:  71  79  Temp:  97.5 F (36.4 C)  98 F (36.7 C)  TempSrc:  Oral  Oral  Resp:  26  30  Height:      Weight:      SpO2: 96% 100% 97% 98%   NAD No JVD Coarse B rales, no wheezes, unlabored Reg, no M NABS, soft Ext warm without edema, no arthritic changes, no synovitis Neuro intact  BMET    Component Value Date/Time   NA 134* 03/30/2015 0807   NA 136 11/17/2013 1327   K 4.1 03/30/2015 0807   K 4.1 11/17/2013 1327   CL 98* 03/30/2015 0807   CL 96* 11/17/2013 1327   CO2 25 03/30/2015 0807   CO2 25 11/17/2013 1327   GLUCOSE 277* 03/30/2015 0807   GLUCOSE 95 11/17/2013 1327   BUN 16 03/30/2015 0807   BUN 16 11/17/2013 1327   CREATININE 1.00 03/30/2015 0807   CREATININE 1.24 11/17/2013 1327   CALCIUM 8.8* 03/30/2015 0807   CALCIUM 9.6 11/17/2013 1327   GFRNONAA >60 03/30/2015 0807   GFRNONAA 50* 11/17/2013 1327   GFRAA >60 03/30/2015 0807   GFRAA 57* 11/17/2013 1327    CBC    Component Value Date/Time   WBC 9.0 03/30/2015 0807   WBC 9.2 11/17/2013 1327   RBC 3.89 03/30/2015 0807   RBC 4.76 11/17/2013 1327   HGB 10.7* 03/30/2015 0807   HGB 14.0 11/17/2013 1327   HCT 33.4* 03/30/2015 0807   HCT 41.8 11/17/2013 1327   PLT 426* 03/30/2015 0807   PLT 267 11/17/2013 1327   MCV 85.9 03/30/2015 0807   MCV 88 11/17/2013 1327    MCH 27.5 03/30/2015 0807   MCH 29.4 11/17/2013 1327   MCHC 32.0 03/30/2015 0807   MCHC 33.4 11/17/2013 1327   RDW 13.9 03/30/2015 0807   RDW 15.0* 11/17/2013 1327   LYMPHSABS 1.9 03/29/2015 0255   LYMPHSABS 2.1 11/17/2013 1327   MONOABS 0.9 03/29/2015 0255   MONOABS 0.8 11/17/2013 1327   EOSABS 1.1* 03/29/2015 0255   EOSABS 0.5 11/17/2013 1327   BASOSABS 0.0 03/29/2015 0255   BASOSABS 0.1 11/17/2013 1327    CT chest 5/19: small R PTX. Extensive changes of fibrosis and pneumonitis - increased infiltrates compared to CT chest in April  CXR5/ 21- I reviewed image- small stable R ptx. [place] B opacities, L>R  IMPRESSION: Recent biopsy proven dx of IPF/UIP Mildly elevated RF of uncertain significance - doubt RA Bilateral AS dz - PNA vs edema vs acute exacerbation IPF (Favor exacerbation). Not    progressive clinically over last few days DM 2, presently controlled IV steroids not indicated for UIP. Question if they help the acute process.  PLAN: Cont current abx for HCAP Cont diuresis to extent permitted by BP and renal function  Monitor electrolytes closely to avoid complications such as hypokalemia Initiate systemic steroids (methylpred  80 mg IV q 12 hrs) for presumed exacerbation of IPF SSI adjusted in light of anticipated increased insulin needs while on steroids Care mgmt consultation requested to seek approval of pirfenidone Counseled re: need for pulm rehab, wt loss, optimal DM control Ultimately might need lung transplant eval although the hx of RCC might preclude transplant  Im not entirely sure this was the dx of her resected "renal mass" Reconsider steroids 5/22  CD Young, MD ; PCCM service p [phone].  After 3:00 weekends, call [phone]

## 2015-03-31 LAB — GLUCOSE, CAPILLARY
GLUCOSE-CAPILLARY: 297 mg/dL — AB (ref 65–99)
Glucose-Capillary: 206 mg/dL — ABNORMAL HIGH (ref 65–99)
Glucose-Capillary: 234 mg/dL — ABNORMAL HIGH (ref 65–99)
Glucose-Capillary: 193 mg/dL — ABNORMAL HIGH (ref 65–99)

## 2015-03-31 LAB — CBC
HEMATOCRIT: 31.8 % — AB (ref 36.0–46.0)
HEMOGLOBIN: 10.3 g/dL — AB (ref 12.0–15.0)
MCH: 28.1 pg (ref 26.0–34.0)
MCHC: 32.4 g/dL (ref 30.0–36.0)
MCV: 86.9 fL (ref 78.0–100.0)
Platelets: 427 10*3/uL — ABNORMAL HIGH (ref 150–400)
RBC: 3.66 MIL/uL — ABNORMAL LOW (ref 3.87–5.11)
RDW: 13.9 % (ref 11.5–15.5)
WBC: 17.1 10*3/uL — ABNORMAL HIGH (ref 4.0–10.5)

## 2015-03-31 LAB — BASIC METABOLIC PANEL
Anion gap: 10 (ref 5–15)
BUN: 19 mg/dL (ref 6–20)
CO2: 26 mmol/L (ref 22–32)
Calcium: 8.9 mg/dL (ref 8.9–10.3)
Chloride: 103 mmol/L (ref 101–111)
Creatinine, Ser: 0.93 mg/dL (ref 0.44–1.00)
GFR calc non Af Amer: >60 mL/min (ref >60)
Glucose, Bld: 177 mg/dL — ABNORMAL HIGH (ref 65–99)
Potassium: 4.3 mmol/L (ref 3.5–5.1)
SODIUM: 139 mmol/L (ref 135–145)

## 2015-03-31 LAB — PROCALCITONIN

## 2015-03-31 MED ORDER — LEVOFLOXACIN 500 MG PO TABS
500.0000 mg | ORAL_TABLET | Freq: Every day | ORAL | Status: DC
Start: 2015-03-31 — End: 2015-04-02
  Administered 2015-03-31 – 2015-04-02 (×3): 500 mg via ORAL
  Filled 2015-03-31 (×3): qty 1

## 2015-03-31 NOTE — Progress Notes (Addendum)
[facility] PROGRESS NOTE  Joann [name] NWG:[id number] DOB: Sep 18, 1961 DOA: 03/28/2015 PCP: Velna Hatchet, MD  Assessment/Plan: 1. Acute respiratory failure with hypoxia   - due to HCAP and exacerbation of UIP    - improving   - s/p 3days of  Vanc/Cefepime, change to Po levaquin   - clinically do not see evidence of volume overload   - Pulm following, On IV solumedrol, ? Cut down dose and plan to start Esbreit, pre-auth pending   -FLu PCR negative   -brovana/pulmicort   -transfer out of SDU  2. 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 Diabetes mellitus type 2   - hold metformin,  Uncontrolled due to steroids  - incraese Lantus and meal coverage, sliding scale coverage.  3. Chronic anemia  -mild, stable, monitor  4. Hypertension -  -stable  5. Hypothyroidism -continue synthroid  OOB, Pt eval  DVT proph: lovenox  Code Status: Full COde Family Communication:none at bedside Disposition Plan: [place] to tele   Consultants:  Pulm  HPI/Subjective: Easily fatigued, improving dyspnea  Objective: Filed Vitals:   03/31/15 0737  BP: 131/72  Pulse: 63  Temp:   Resp: 29    Intake/Output Summary (Last 24 hours) at 03/31/15 0755 Last data filed at 03/31/15 0603  Gross per 24 hour  Intake   1100 ml  Output   1950 ml  Net   -850 ml   Filed Weights   03/28/15 2123 03/29/15 0500 03/31/15 0500  Weight: 83.87 kg (184 lb 14.4 oz) 81.738 kg (180 lb 3.2 oz) 82.146 kg (181 lb 1.6 oz)    Exam:   General: AAOX3, no distress, more comfortable appearing  Cardiovascular: S1S2/RRR  Respiratory: coarse crackles throughout  Abdomen: soft, NT, BS present  Musculoskeletal: no edema c/c  Data Reviewed: Basic Metabolic Panel:  Recent Labs Lab 03/28/15 1720 03/28/15 2241 03/29/15 0255 03/30/15 0807 03/31/15 0511  NA 138  --  137 134* 139  K 3.7  --  3.9 4.1 4.3  CL 99*  --  101 98* 103  CO2   --   --  25 25 26   GLUCOSE 187*  --  105* 277* 177*  BUN 14  --  10 16 19   CREATININE 1.00 0.94 0.95 1.00 0.93  CALCIUM  --   --  9.4 8.8* 8.9   Liver Function Tests:  Recent Labs Lab 03/29/15 0255  AST 19  ALT 17  ALKPHOS 108  BILITOT 0.3  PROT 7.2  ALBUMIN 2.7*   No results for input(s): LIPASE, AMYLASE in the last 168 hours. No results for input(s): AMMONIA in the last 168 hours. CBC:  Recent Labs Lab 03/28/15 1707 03/28/15 1720 03/29/15 0255 03/30/15 0807 03/31/15 0511  WBC 8.7  --  9.2 9.0 17.1*  NEUTROABS 5.2  --  5.3  --   --   HGB 10.1* 11.2* 10.5* 10.7* 10.3*  HCT 32.2* 33.0* 33.3* 33.4* 31.8*  MCV 87.3  --  87.4 85.9 86.9  PLT 432*  --  421* 426* 427*   Cardiac Enzymes:  Recent Labs Lab 03/28/15 2241  TROPONINI <0.03   BNP (last 3 results)  Recent Labs  02/27/15 1843 02/28/15 1152 03/28/15 2241  BNP 27.7 66.7 17.3    ProBNP (last 3 results) No results for input(s): PROBNP in the last 8760 hours.  CBG:  Recent Labs Lab 03/30/15 0744 03/30/15 1128 03/30/15 1705 03/30/15 2009 03/30/15  [place]    No results found for this or any previous visit (from the past 240 hour(s)).   Studies: Dg Chest Port 1 View  03/30/2015   CLINICAL DATA:  Respiratory failure.  Followup right pneumothorax.  EXAM: PORTABLE CHEST - 1 VIEW  COMPARISON:  03/29/2015.  FINDINGS: Decreased size of a small right lateral pneumothorax. No significant change in enlargement of the cardiac silhouette, prominence of the interstitial markings and airspace opacity in the majority at the left lung. Thoracic spine degenerative changes and cervical spine fixation hardware.  IMPRESSION: 1. Approximately 5% right lateral pneumothorax with mild improvement. 2. No significant change in diffuse left lung alveolar edema or pneumonia. 3. Stable cardiomegaly and interstitial lung disease.   Electronically Signed   By: Claudie Revering M.D.   On: 03/30/2015 10:31     Scheduled Meds: . arformoterol  15 mcg Nebulization BID  . budesonide (PULMICORT) nebulizer solution  0.5 mg Nebulization BID  . dicyclomine  20 mg Oral Q6H  . DULoxetine  60 mg Oral Daily  . enoxaparin (LOVENOX) injection  40 mg Subcutaneous Q24H  . guaiFENesin  600 mg Oral BID  . insulin aspart  0-15 Units Subcutaneous TID WC  . insulin aspart  0-5 Units Subcutaneous QHS  . insulin aspart  6 Units Subcutaneous TID WC  . insulin glargine  65 Units Subcutaneous QHS  . levofloxacin  500 mg Oral Daily  . levothyroxine  200 mcg Oral QAC breakfast  . methylPREDNISolone (SOLU-MEDROL) injection  80 mg Intravenous 3 times per day  . pantoprazole  40 mg Oral Daily  . sodium chloride  3 mL Intravenous Q12H  . topiramate  25 mg Oral BID  . traZODone  75 mg Oral QHS  . vitamin E  800 Units Oral Daily   Continuous Infusions:  Antibiotics Given (last 72 hours)    Date/Time Action Medication Dose Rate   03/29/15 0400 Given   ceFEPIme (MAXIPIME) 2 g in dextrose 5 % 50 mL IVPB 2 g 100 mL/hr   03/29/15 0500 Given   vancomycin (VANCOCIN) IVPB 1000 mg/200 mL premix 1,000 mg 200 mL/hr   03/29/15 1000 Given   ceFEPIme (MAXIPIME) 2 g in dextrose 5 % 50 mL IVPB 2 g 100 mL/hr   03/29/15 1640 Given   vancomycin (VANCOCIN) IVPB 1000 mg/200 mL premix 1,000 mg 200 mL/hr   03/30/15 0002 Given   ceFEPIme (MAXIPIME) 2 g in dextrose 5 % 50 mL IVPB 2 g 100 mL/hr   03/30/15 0504 Given   vancomycin (VANCOCIN) IVPB 1000 mg/200 mL premix 1,000 mg 200 mL/hr   03/30/15 1031 Given   ceFEPIme (MAXIPIME) 2 g in dextrose 5 % 50 mL IVPB 2 g 100 mL/hr   03/30/15 1556 Given   vancomycin (VANCOCIN) IVPB 1000 mg/200 mL premix 1,000 mg 200 mL/hr   03/30/15 2309 Given   ceFEPIme (MAXIPIME) 2 g in dextrose 5 % 50 mL IVPB 2 g 100 mL/hr   03/31/15 0603 Given   vancomycin (VANCOCIN) IVPB 1000 mg/200 mL premix 1,000 mg 200 mL/hr      Principal Problem:   Acute respiratory failure with hypoxia Active Problems:   HTN  (hypertension)   ILD (interstitial lung disease)   Diabetes mellitus type 2, controlled   Chronic anemia   Hypertension   Hypothyroidism   Increased oxygen demand   Shortness of breath   IPF (idiopathic pulmonary fibrosis)   Respiratory failure    Time  spent: 9min    Joann [name]  [facility] Pager [phone]. If 7PM-7AM, please contact night-coverage at [email or web address], password [facility] 03/31/2015, 7:55 AM  LOS: 3 days

## 2015-03-31 NOTE — Evaluation (Signed)
Physical Therapy Evaluation Patient Details Name: KEMISHA BONNETTE MRN: [id number] DOB: 26-Mar-1961 Today's Date: 03/31/2015   History of Present Illness  Patient is admitted for acute on chronic respiratory failure. also with history of diabetes mellitus type 2, hypertension, hypothyroidism who was recently diagnosed with pulm fibrosis and had biopsy done last week  Clinical Impression  Patient independent with all mobility and no apparent balance deficits noted.  No further PT needs identified.  Patient is limited by cardiopulmonary status which can be addressed by nursing and pulm rehab.  Will sign off.    Follow Up Recommendations No PT follow up    Equipment Recommendations  None recommended by PT    Recommendations for Other Services       Precautions / Restrictions Precautions Precautions: None Restrictions Weight Bearing Restrictions: No      Mobility  Bed Mobility Overal bed mobility: Independent                Transfers Overall transfer level: Independent                  Ambulation/Gait Ambulation/Gait assistance: Independent Ambulation Distance (Feet): 100 Feet Assistive device: None Gait Pattern/deviations: WFL(Within Functional Limits)     General Gait Details: Patient with increase dypnea with gait - unable to continue talking.  Had to take rest break on return to room.  No loss of balance even with perterbations  Stairs            Wheelchair Mobility    Modified Rankin (Stroke Patients Only)       Balance Overall balance assessment: No apparent balance deficits (not formally assessed)                                           Pertinent Vitals/Pain Pain Assessment: No/denies pain    Home Living Family/patient expects to be discharged to:: Private residence Living Arrangements: Spouse/significant other Available Help at Discharge: Family Type of Home: House                Prior Function Level of  Independence: Independent               Hand Dominance        Extremity/Trunk Assessment   Upper Extremity Assessment: Overall WFL for tasks assessed           Lower Extremity Assessment: Overall WFL for tasks assessed      Cervical / Trunk Assessment: Normal  Communication   Communication: No difficulties  Cognition Arousal/Alertness: Awake/alert Behavior During Therapy: WFL for tasks assessed/performed Overall Cognitive Status: Within Functional Limits for tasks assessed                      General Comments      Exercises        Assessment/Plan    PT Assessment Patent does not need any further PT services  PT Diagnosis Generalized weakness   PT Problem List    PT Treatment Interventions     PT Goals (Current goals can be found in the Care Plan section) Acute Rehab PT Goals PT Goal Formulation: All assessment and education complete, DC therapy    Frequency     Barriers to discharge        Co-evaluation               End of  Session Equipment Utilized During Treatment: Gait belt;Oxygen Activity Tolerance: Patient tolerated treatment well Patient left: in chair;with family/visitor present;with call bell/phone within reach           Time: 2633-3545 PT Time Calculation (min) (ACUTE ONLY): 15 min   Charges:   PT Evaluation $Initial PT Evaluation Tier I: 1 Procedure     PT G CodesShanna Cisco 03/31/2015, 4:23 PM 03/31/2015 [place], [place]

## 2015-04-01 ENCOUNTER — Inpatient Hospital Stay (HOSPITAL_COMMUNITY): Payer: 59

## 2015-04-01 DIAGNOSIS — J189 Pneumonia, unspecified organism: Secondary | ICD-10-CM

## 2015-04-01 DIAGNOSIS — J45909 Unspecified asthma, uncomplicated: Secondary | ICD-10-CM | POA: Insufficient documentation

## 2015-04-01 DIAGNOSIS — G4733 Obstructive sleep apnea (adult) (pediatric): Secondary | ICD-10-CM | POA: Insufficient documentation

## 2015-04-01 LAB — GLUCOSE, CAPILLARY
Glucose-Capillary: 174 mg/dL — ABNORMAL HIGH (ref 65–99)
Glucose-Capillary: 211 mg/dL — ABNORMAL HIGH (ref 65–99)
Glucose-Capillary: 180 mg/dL — ABNORMAL HIGH (ref 65–99)
Glucose-Capillary: 229 mg/dL — ABNORMAL HIGH (ref 65–99)

## 2015-04-01 MED ORDER — MOMETASONE FURO-FORMOTEROL FUM 100-5 MCG/ACT IN AERO
2.0000 | INHALATION_SPRAY | Freq: Two times a day (BID) | RESPIRATORY_TRACT | Status: DC
  Administered 2015-04-01 – 2015-04-02 (×3): 2 via RESPIRATORY_TRACT
  Filled 2015-04-01: qty 8.8

## 2015-04-01 MED ORDER — PREDNISONE 20 MG PO TABS
40.0000 mg | ORAL_TABLET | Freq: Every day | ORAL | Status: DC
  Administered 2015-04-02: 40 mg via ORAL
  Filled 2015-04-01 (×3): qty 2

## 2015-04-01 MED ORDER — METHYLPREDNISOLONE SODIUM SUCC 125 MG IJ SOLR
60.0000 mg | Freq: Two times a day (BID) | INTRAMUSCULAR | Status: DC
  Filled 2015-04-01 (×2): qty 0.96

## 2015-04-01 MED ORDER — FUROSEMIDE 10 MG/ML IJ SOLN
40.0000 mg | Freq: Once | INTRAMUSCULAR | Status: AC
  Administered 2015-04-01: 40 mg via INTRAVENOUS
  Filled 2015-04-01: qty 4

## 2015-04-01 MED ORDER — SENNOSIDES-DOCUSATE SODIUM 8.6-50 MG PO TABS
1.0000 | ORAL_TABLET | Freq: Two times a day (BID) | ORAL | Status: DC
  Administered 2015-04-01 – 2015-04-02 (×3): 1 via ORAL
  Filled 2015-04-01 (×4): qty 1

## 2015-04-01 NOTE — Progress Notes (Signed)
Inpatient Diabetes Program Recommendations  AACE/ADA: New Consensus Statement on Inpatient Glycemic Control (2013)  Target Ranges:  Prepandial:   less than 140 mg/dL      Peak postprandial:   less than 180 mg/dL (1-2 hours)      Critically ill patients:  140 - 180 mg/dL   Reason for Visit: Results for AZALIAH, CARRERO (MRN [id number]) as of 04/01/2015 14:32  Ref. Range 03/31/2015 11:33 03/31/2015 16:19 03/31/2015 21:28 04/01/2015 06:12 04/01/2015 11:15  Glucose-Capillary Latest Ref Range: 65-99 mg/dL 297 (H) 234 (H) 193 (H) 174 (H) 211 (H)    Consider increasing Novolog meal coverage to 8 units tid with meals.  Thanks, Adah Perl, RN, BC-ADM Inpatient Diabetes Coordinator Pager [phone] (8a-5p)

## 2015-04-01 NOTE — Progress Notes (Signed)
Name:Joann [name] KTG:[id number] DOB:1960/12/18   ADMISSION DATE: 03/28/2015 CONSULTATION DATE: 03/28/2015  REFERRING MD : Hal Hope  CHIEF COMPLAINT: SOB  BRIEF PATIENT DESCRIPTION: 54 y.o. F with recently diagnosed UIP with lymphoid hyperplasia (via VATS bx on 03/21/15), brought to [facility] ED with persistent SOB and increasing O2 requirements. PCCM consulted for recs.  Studies: CT chest 5/19: small R PTX. Extensive changes of fibrosis and pneumonitis - increased infiltrates compared to CT chest in April  Subjective: Breathing better.  Denies chest pain.  Not as much cough.  Objective: Temp:  [97.4 F (36.3 C)-98.1 F (36.7 C)] 97.4 F (36.3 C) (05/23 0450) Pulse Rate:  [64-78] 64 (05/23 0450) Resp:  [20-24] 20 (05/23 0450) BP: (115-142)/(57-82) 115/57 mmHg (05/23 0450) SpO2:  [95 %-100 %] 95 % (05/23 0450) Weight:  [180 lb 5.4 oz (81.8 kg)] 180 lb 5.4 oz (81.8 kg) (05/23 0450)  Physical Exam: General: pleasant HEENT: no sinus tenderness Cardiac: regular Chest: b/l coarse crackles Abd: soft, non tender Ext: no edema Neuro: moves all extremities Skin: no rash  CMP Latest Ref Rng 03/31/2015 03/30/2015 03/29/2015  Glucose 65 - 99 mg/dL 177(H) 277(H) 105(H)  BUN 6 - 20 mg/dL 19 16 10   Creatinine 0.44 - 1.00 mg/dL 0.93 1.00 0.95  Sodium 135 - 145 mmol/L 139 134(L) 137  Potassium 3.5 - 5.1 mmol/L 4.3 4.1 3.9  Chloride 101 - 111 mmol/L 103 98(L) 101  CO2 22 - 32 mmol/L 26 25 25   Calcium 8.9 - 10.3 mg/dL 8.9 8.8(L) 9.4  Total Protein 6.5 - 8.1 g/dL - - 7.2  Total Bilirubin 0.3 - 1.2 mg/dL - - 0.3  Alkaline Phos 38 - 126 U/L - - 108  AST 15 - 41 U/L - - 19  ALT 14 - 54 U/L - - 17    CBC Latest Ref Rng 03/31/2015 03/30/2015 03/29/2015  WBC 4.0 - 10.5 K/uL 17.1(H) 9.0 9.2  Hemoglobin 12.0 - 15.0 g/dL 10.3(L) 10.7(L) 10.5(L)  Hematocrit 36.0 - 46.0 % 31.8(L) 33.4(L) 33.3(L)  Platelets 150 - 400 K/uL 427(H) 426(H) 421(H)     Dg Chest 2 View  04/01/2015    CLINICAL DATA:  Pulmonary fibrosis .  EXAM: CHEST  2 VIEW  COMPARISON:  Chest x-ray 03/29/2015 and 03/21/2015. CT chest 03/29/2015 and 02/28/2015.  FINDINGS: Mediastinum and hilar structures are stable. Heart size is stable. Diffuse pulmonary interstitial prominence again noted consistent with chronic interstitial lung disease. Previously identified right pneumothorax has resolved. Prior cervical spine fusion. Prior cholecystectomy. Surgical clips left upper quadrant.  IMPRESSION: 1. Unchanged bilateral pulmonary interstitial prominence consistent with chronic interstitial lung disease. Superimposed interstitial edema and/or pneumonitis cannot be excluded. 2. Interim resolution of right pneumothorax.   Electronically Signed   By: Marcello Moores  Register   On: 04/01/2015 07:37    Assessment/Plan:  Acute on chronic hypoxic respiratory failure in setting of IPF/UIP >> ? Pneumonitis vs infection vs edema (probably combination of all).  Has elevated RF ?significance >> no other evidence for RA. Plan: - adjust oxygen to keep SpO2 90 to 95% - f/u CXR intermittently - transition to oral prednisone 5/23  - Day 5/7 of Abx, currently on levaquin - diuresis as tolerated   Hx of OSA. Plan: - qhs CPAP >> she will need new CPAP machine after discharge >> her machine is more than 55 yrs old  Hx of asthma. Plan: - resume advair - prn albuterol  Chesley Mires, MD [place] [facility] 04/01/2015, 10:02 AM Pager:  [phone] After  3pm call: [phone]

## 2015-04-01 NOTE — Care Management Note (Signed)
Case Management Note  Patient Details  Name: SHAUGHNESSY GETHERS MRN: [id number] Date of Birth: 1961/09/16  Subjective/Objective:   Dx UIP                 Action/Plan: Girard Cooter, RN Case Manager Signed CASE MANAGEMENT Progress Notes 03/29/2015 4:19 PM    Expand All Collapse All   Esbriet (pirfenidone) will require prior auth before UMR will provide information re copay. Prior auth form placed in shadow chart and marked URGENT. Pt fills scripts @ [facility] and pharmacist will need 24 hr advance notice to acquire medication.        Expected Discharge Date:                  Expected Discharge Plan:  Home/Self Care  In-House Referral:     Discharge planning Services  CM Consult, Medication Assistance  Post Acute Care Choice:    Choice offered to:     DME Arranged:    DME Agency:     HH Arranged:    HH Agency:     Status of Service:  In process, will continue to follow  Medicare Important Message Given:    Date Medicare IM Given:    Medicare IM give by:    Date Additional Medicare IM Given:    Additional Medicare Important Message give by:     If discussed at [place] of Stay Meetings, dates discussed:    Additional Comments:  Girard Cooter, RN 04/01/2015, 9:19 AM

## 2015-04-01 NOTE — Progress Notes (Signed)
[facility] PROGRESS NOTE  Joann [name] DSK:[id number] DOB: January 25, 1961 DOA: 03/28/2015 PCP: Velna Hatchet, MD  Assessment/Plan: 1. Acute respiratory failure with hypoxia   - due to HCAP and exacerbation of UIP    - improving   - s/p 3days of  Vanc/Cefepime, changed to Po levaquin 5/22, day 4/7   - clinically do not see evidence of volume overload   - On IV solumedrol, will cut down dose   - Per Pulm  plan to start Esbreit, will likely need pre-auth   -FLu PCR negative   -brovana/pulmicort   -Pt following, will need Pulm rehab  2. 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 Diabetes mellitus type 2   - hold metformin,  Uncontrolled due to steroids, cutting down steroids, monitor  - incraese Lantus and meal coverage, sliding scale coverage.  3. Chronic anemia  -mild, stable, monitor  4. Hypertension -  -stable  5. Hypothyroidism -continue synthroid  OOB, Pt eval  DVT proph: lovenox  Code Status: Full COde Family Communication:none at bedside Disposition Plan: home in 1-2days  Consultants:  Pulm  HPI/Subjective: Feels better, breathing improving  Objective: Filed Vitals:   04/01/15 0450  BP: 115/57  Pulse: 64  Temp: 97.4 F (36.3 C)  Resp: 20    Intake/Output Summary (Last 24 hours) at 04/01/15 0845 Last data filed at 04/01/15 0600  Gross per 24 hour  Intake    360 ml  Output    300 ml  Net     60 ml   Filed Weights   03/29/15 0500 03/31/15 0500 04/01/15 0450  Weight: 81.738 kg (180 lb 3.2 oz) 82.146 kg (181 lb 1.6 oz) 81.8 kg (180 lb 5.4 oz)    Exam:   General: AAOX3, no distress, more comfortable appearing  Cardiovascular: S1S2/RRR  Respiratory: coarse crackles throughout, unchanged  Abdomen: soft, NT, BS present  Musculoskeletal: no edema c/c  Data Reviewed: Basic Metabolic Panel:  Recent Labs Lab 03/28/15 1720 03/28/15 2241 03/29/15 0255 03/30/15 0807  03/31/15 0511  NA 138  --  137 134* 139  K 3.7  --  3.9 4.1 4.3  CL 99*  --  101 98* 103  CO2  --   --  25 25 26   GLUCOSE 187*  --  105* 277* 177*  BUN 14  --  10 16 19   CREATININE 1.00 0.94 0.95 1.00 0.93  CALCIUM  --   --  9.4 8.8* 8.9   Liver Function Tests:  Recent Labs Lab 03/29/15 0255  AST 19  ALT 17  ALKPHOS 108  BILITOT 0.3  PROT 7.2  ALBUMIN 2.7*   No results for input(s): LIPASE, AMYLASE in the last 168 hours. No results for input(s): AMMONIA in the last 168 hours. CBC:  Recent Labs Lab 03/28/15 1707 03/28/15 1720 03/29/15 0255 03/30/15 0807 03/31/15 0511  WBC 8.7  --  9.2 9.0 17.1*  NEUTROABS 5.2  --  5.3  --   --   HGB 10.1* 11.2* 10.5* 10.7* 10.3*  HCT 32.2* 33.0* 33.3* 33.4* 31.8*  MCV 87.3  --  87.4 85.9 86.9  PLT 432*  --  421* 426* 427*   Cardiac Enzymes:  Recent Labs Lab 03/28/15 2241  TROPONINI <0.03   BNP (last 3 results)  Recent Labs  02/27/15 1843 02/28/15 1152 03/28/15 2241  BNP 27.7 66.7 17.3    ProBNP (last 3 results) No results for input(s):  PROBNP in the last 8760 hours.  CBG:  Recent Labs Lab 03/31/15 0736 03/31/15 1133 03/31/15 1619 03/31/15 2128 04/01/15 0612  GLUCAP 206* 297* 234* 193* 174*    No results found for this or any previous visit (from the past 240 hour(s)).   Studies: Dg Chest 2 View  04/01/2015   CLINICAL DATA:  Pulmonary fibrosis .  EXAM: CHEST  2 VIEW  COMPARISON:  Chest x-ray 03/29/2015 and 03/21/2015. CT chest 03/29/2015 and 02/28/2015.  FINDINGS: Mediastinum and hilar structures are stable. Heart size is stable. Diffuse pulmonary interstitial prominence again noted consistent with chronic interstitial lung disease. Previously identified right pneumothorax has resolved. Prior cervical spine fusion. Prior cholecystectomy. Surgical clips left upper quadrant.  IMPRESSION: 1. Unchanged bilateral pulmonary interstitial prominence consistent with chronic interstitial lung disease. Superimposed  interstitial edema and/or pneumonitis cannot be excluded. 2. Interim resolution of right pneumothorax.   Electronically Signed   By: Marcello Moores  Register   On: 04/01/2015 07:37    Scheduled Meds: . arformoterol  15 mcg Nebulization BID  . budesonide (PULMICORT) nebulizer solution  0.5 mg Nebulization BID  . dicyclomine  20 mg Oral Q6H  . DULoxetine  60 mg Oral Daily  . enoxaparin (LOVENOX) injection  40 mg Subcutaneous Q24H  . guaiFENesin  600 mg Oral BID  . insulin aspart  0-15 Units Subcutaneous TID WC  . insulin aspart  0-5 Units Subcutaneous QHS  . insulin aspart  6 Units Subcutaneous TID WC  . insulin glargine  65 Units Subcutaneous QHS  . levofloxacin  500 mg Oral Daily  . levothyroxine  200 mcg Oral QAC breakfast  . methylPREDNISolone (SOLU-MEDROL) injection  60 mg Intravenous Q12H  . pantoprazole  40 mg Oral Daily  . senna-docusate  1 tablet Oral BID  . sodium chloride  3 mL Intravenous Q12H  . topiramate  25 mg Oral BID  . traZODone  75 mg Oral QHS  . vitamin E  800 Units Oral Daily   Continuous Infusions:  Antibiotics Given (last 72 hours)    Date/Time Action Medication Dose Rate   03/29/15 1000 Given   ceFEPIme (MAXIPIME) 2 g in dextrose 5 % 50 mL IVPB 2 g 100 mL/hr   03/29/15 1640 Given   vancomycin (VANCOCIN) IVPB 1000 mg/200 mL premix 1,000 mg 200 mL/hr   03/30/15 0002 Given   ceFEPIme (MAXIPIME) 2 g in dextrose 5 % 50 mL IVPB 2 g 100 mL/hr   03/30/15 0504 Given   vancomycin (VANCOCIN) IVPB 1000 mg/200 mL premix 1,000 mg 200 mL/hr   03/30/15 1031 Given   ceFEPIme (MAXIPIME) 2 g in dextrose 5 % 50 mL IVPB 2 g 100 mL/hr   03/30/15 1556 Given   vancomycin (VANCOCIN) IVPB 1000 mg/200 mL premix 1,000 mg 200 mL/hr   03/30/15 2309 Given   ceFEPIme (MAXIPIME) 2 g in dextrose 5 % 50 mL IVPB 2 g 100 mL/hr   03/31/15 0603 Given   vancomycin (VANCOCIN) IVPB 1000 mg/200 mL premix 1,000 mg 200 mL/hr   03/31/15 1000 Given   levofloxacin (LEVAQUIN) tablet 500 mg 500 mg        Principal Problem:   Acute respiratory failure with hypoxia Active Problems:   HTN (hypertension)   ILD (interstitial lung disease)   Diabetes mellitus type 2, controlled   Chronic anemia   Hypertension   Hypothyroidism   Increased oxygen demand   Shortness of breath   IPF (idiopathic pulmonary fibrosis)   Respiratory failure    Time  spent: 42min    Obi Scrima  [facility] Pager [phone]. If 7PM-7AM, please contact night-coverage at [email or web address], password [facility] 04/01/2015, 8:45 AM  LOS: 4 days

## 2015-04-02 ENCOUNTER — Inpatient Hospital Stay: Payer: 59 | Admitting: Internal Medicine

## 2015-04-02 DIAGNOSIS — J849 Interstitial pulmonary disease, unspecified: Secondary | ICD-10-CM

## 2015-04-02 LAB — GLUCOSE, CAPILLARY
GLUCOSE-CAPILLARY: 179 mg/dL — AB (ref 65–99)
Glucose-Capillary: 53 mg/dL — ABNORMAL LOW (ref 65–99)
Glucose-Capillary: 92 mg/dL (ref 65–99)

## 2015-04-02 LAB — BASIC METABOLIC PANEL
Anion gap: 8 (ref 5–15)
BUN: 25 mg/dL — AB (ref 6–20)
CHLORIDE: 105 mmol/L (ref 101–111)
CO2: 27 mmol/L (ref 22–32)
CREATININE: 0.93 mg/dL (ref 0.44–1.00)
Calcium: 8.6 mg/dL — ABNORMAL LOW (ref 8.9–10.3)
Glucose, Bld: 51 mg/dL — ABNORMAL LOW (ref 65–99)
Potassium: 3.3 mmol/L — ABNORMAL LOW (ref 3.5–5.1)
Sodium: 140 mmol/L (ref 135–145)

## 2015-04-02 LAB — PROCALCITONIN: Procalcitonin: <0.1 ng/mL

## 2015-04-02 MED ORDER — INSULIN ASPART 100 UNIT/ML ~~LOC~~ SOLN: 0.0000 [IU] | Freq: Three times a day (TID) | SUBCUTANEOUS | Status: DC

## 2015-04-02 MED ORDER — INSULIN GLARGINE 100 UNIT/ML ~~LOC~~ SOLN
55.0000 [IU] | Freq: Every day | SUBCUTANEOUS | Status: DC
Start: 2015-04-02 — End: 2015-05-20

## 2015-04-02 MED ORDER — INSULIN GLARGINE 100 UNIT/ML ~~LOC~~ SOLN
60.0000 [IU] | Freq: Every day | SUBCUTANEOUS | Status: DC
  Filled 2015-04-02: qty 0.6

## 2015-04-02 MED ORDER — PREDNISONE 10 MG PO TABS: 30.0000 mg | ORAL_TABLET | Freq: Every day | ORAL | Status: DC

## 2015-04-02 MED ORDER — LEVOFLOXACIN 500 MG PO TABS: 500.0000 mg | ORAL_TABLET | Freq: Every day | ORAL | Status: DC

## 2015-04-02 NOTE — Progress Notes (Signed)
Utilization review completed.  

## 2015-04-02 NOTE — Progress Notes (Signed)
Pt discharge education and instructions completed with pt and spouse at bedside, both voices understanding and denies any questions. Pt IV and telemetry removed; pt to pick up electronically sent prescriptions from preferred pharmacy on file. Pt discharge home with spouse to transport her home. Pt CPAP mask delivered to pt at bedside; pt transported off unit via wheelchair with spouse and belongings to the side and pt her personal oxygen. Francis Gaines Zabrina Brotherton RN.

## 2015-04-02 NOTE — Consult Note (Signed)
   [facility] CM Inpatient Consult   04/02/2015  LEONORE FRANKSON June 19, 1961 [id number]   Patient active with [employer] to Wellness program for [employer] employees/dependents with [facility] insurance. Made bedside visit to speak with patient. Encouraged her to make post hospital discharge MD appointment within 7 to 10 days of discharge. Also discussed her calling [employer] regarding her FMLA. Appreciative of visit. Left contact information at bedside. Made inpatient RNCM aware.     Marthenia Rolling, MSN-Ed, RN,BSN [facility] Liaison [phone]

## 2015-04-02 NOTE — Discharge Summary (Signed)
Physician Discharge Summary  Joann [name] QQV:[id number] DOB: [birth year] DOA: 03/28/2015  PCP: Velna Hatchet, MD  Admit date: 03/28/2015 Discharge date: 04/02/2015  Time spent: 45 minutes  Recommendations for Outpatient Follow-up:  1. Tammy Parret 5/31 2. Dr.Ramaswamy on 6/21   Discharge Diagnoses:  Principal Problem:   Acute respiratory failure with hypoxia   Exacerbation of PULMONARY FIbrosis   Possible HCAP   HTN (hypertension)   ILD (interstitial lung disease)   Diabetes mellitus type 2, controlled   Chronic anemia   Hypertension   Hypothyroidism   Increased oxygen demand   Shortness of breath   IPF (idiopathic pulmonary fibrosis)   Respiratory failure   Pneumonitis   OSA (obstructive sleep apnea)  Discharge Condition: Stable   Diet recommendation: Diabetic  Filed Weights   03/31/15 0500 04/01/15 0450 04/02/15 0605  Weight: 82.146 kg (181 lb 1.6 oz) 81.8 kg (180 lb 5.4 oz) 81.6 kg (179 lb 14.3 oz)    History of present illness:  Chief Complaint: Shortness of breath. HPI: Joann [name] is a 54 y.o. female with history of diabetes mellitus type 2, hypertension, hypothyroidism who was recently diagnosed with poorly fibrosis and had biopsy done last week presented to the ER because of worsening shortness of breath. Patient stated she is usually short of breath last few weeks but over the past 2 days had become acutely short of breath to the point she is not able to walk even a few meters.   Hospital Course:  1. Acute respiratory failure with hypoxia  - due to HCAP and exacerbation of UIP   - Much improved,  s/p 3days of Vanc/Cefepime, then changed to Po levaquin 5/22, day 6/7   - Also received 2 doses of 20 mg of IV Lasix upon admission  - was treated with iv solumedrol, this was then cut down and transition to prednisone yesterday, per Dr.Sood continued 30 mg of prednisone upon discharge until follow-up on 5/31  - Dr.Sood also completed prior authorization for  OFEV   -FLu PCR negative  -brovana/pulmicort  -Pt following, will need Pulm rehab  2. Pulm fibrosis -recent biopsy showed UIP with lymphoid hyperplasia, trace R hemothorax -now on 3L Home O2  2. Diabetes mellitus type 2 - Uncontrolled due to steroids,  improved as steroid dose tapered,  Continue lantus and meal coverage insulin   3. Chronic anemia -mild, stable, monitor  4. Hypertension - -stable  5. Hypothyroidism -continue synthroid  Consultations:    Discharge Exam: Filed Vitals:   04/02/15 1002  BP: 117/71  Pulse: 91  Temp: 98.1 F (36.7 C)  Resp: 18    General: AAOx3 Cardiovascular: CTAB Respiratory: coarse crackles throughout lungs  Discharge Instructions   Discharge Instructions    Diet - low sodium heart healthy    Complete by:  As directed      Diet Carb Modified    Complete by:  As directed      Increase activity slowly    Complete by:  As directed           Current Discharge Medication List    START taking these medications   Details  levofloxacin (LEVAQUIN) 500 MG tablet Take 1 tablet (500 mg total) by mouth daily. For 1day Qty: 1 tablet, Refills: 0    predniSONE (DELTASONE) 10 MG tablet Take 3 tablets (30 mg total) by mouth daily with breakfast. Take 30mg  Daily till Pulm FU on 5/31 Qty: 30 tablet, Refills: 0  CONTINUE these medications which have CHANGED   Details  insulin aspart (NOVOLOG) 100 UNIT/ML injection Inject 0-15 Units into the skin 3 (three) times daily with meals. Sliding scale with meals    insulin glargine (LANTUS) 100 UNIT/ML injection Inject 0.55 mLs (55 Units total) into the skin at bedtime.      CONTINUE these medications which have NOT CHANGED   Details  albuterol (PROVENTIL HFA;VENTOLIN HFA) 108 (90 BASE) MCG/ACT inhaler Inhale 2 puffs into the lungs every 6 (six) hours as needed. For shortness of breath    ALPRAZolam (XANAX) 0.5 MG tablet Take 0.5 mg by mouth daily as needed for anxiety.      amLODipine (NORVASC) 5 MG tablet Take 5 mg by mouth every morning.     cholecalciferol (VITAMIN D) 1000 UNITS tablet Take 1,000 Units by mouth daily.    dicyclomine (BENTYL) 20 MG tablet Take 20 mg by mouth every 6 (six) hours.    DULoxetine (CYMBALTA) 60 MG capsule Take 60 mg by mouth daily.    esomeprazole (NEXIUM) 40 MG capsule Take 40 mg by mouth 2 (two) times daily before a meal.     estrogens, conjugated, (PREMARIN) 0.625 MG tablet Take 0.625 mg by mouth daily.     Fluticasone-Salmeterol (ADVAIR) 100-50 MCG/DOSE AEPB Inhale 1 puff into the lungs as needed (shortness of breath).     guaiFENesin (MUCINEX) 600 MG 12 hr tablet Take 1 tablet (600 mg total) by mouth 2 (two) times daily.    levothyroxine (SYNTHROID, LEVOTHROID) 200 MCG tablet Take 200 mcg by mouth daily before breakfast.    meclizine (ANTIVERT) 25 MG tablet Take 25 mg by mouth 3 (three) times daily as needed for dizziness.    metFORMIN (GLUCOPHAGE-XR) 500 MG 24 hr tablet Take 1,000 mg by mouth 2 (two) times daily.    ondansetron (ZOFRAN) 4 MG tablet Take 4 mg by mouth every 8 (eight) hours as needed for nausea or vomiting.    oxyCODONE (OXY IR/ROXICODONE) 5 MG immediate release tablet Take 1-2 tablets (5-10 mg total) by mouth every 4 (four) hours as needed for severe pain. Qty: 30 tablet, Refills: 0    topiramate (TOPAMAX) 25 MG tablet Take 25 mg by mouth 2 (two) times daily.    traZODone (DESYREL) 150 MG tablet Take 75 mg by mouth at bedtime.    triamterene-hydrochlorothiazide (MAXZIDE-25) 37.5-25 MG per tablet Take 1 tablet by mouth every evening.    vitamin E 400 UNIT capsule Take 800 Units by mouth daily.      STOP taking these medications     indomethacin (INDOCIN) 25 MG capsule        Allergies  Allergen Reactions  . Peanut-Containing Drug Products Shortness Of Breath    Pecans, walnuts as well  . Sulfa Antibiotics Hives and Shortness Of Breath  . Betadine [Povidone Iodine] Rash  . Latex Rash    Follow-up Information    Follow up with PARRETT,TAMMY, NP On 04/09/2015.   Specialty:  Nurse Practitioner   Why:  with [facility]   Contact information:   [address] [place] [address] [phone]       Follow up with [facility], MD On 04/30/2015.   Specialty:  Pulmonary Disease   Contact information:   [place] [address] [phone]        The results of significant diagnostics from this hospitalization (including imaging, microbiology, ancillary and laboratory) are listed below for reference.    Significant Diagnostic Studies: Dg  Chest 2 View  04/01/2015   CLINICAL DATA:  Pulmonary fibrosis .  EXAM: CHEST  2 VIEW  COMPARISON:  Chest x-ray 03/29/2015 and 03/21/2015. CT chest 03/29/2015 and 02/28/2015.  FINDINGS: Mediastinum and hilar structures are stable. Heart size is stable. Diffuse pulmonary interstitial prominence again noted consistent with chronic interstitial lung disease. Previously identified right pneumothorax has resolved. Prior cervical spine fusion. Prior cholecystectomy. Surgical clips left upper quadrant.  IMPRESSION: 1. Unchanged bilateral pulmonary interstitial prominence consistent with chronic interstitial lung disease. Superimposed interstitial edema and/or pneumonitis cannot be excluded. 2. Interim resolution of right pneumothorax.   Electronically Signed   By: Marcello Moores  Register   On: 04/01/2015 07:37   Dg Chest 2 View  03/28/2015   CLINICAL DATA:  Shortness of breath, weakness for 4 days  EXAM: CHEST  2 VIEW  COMPARISON:  03/24/2015  FINDINGS: There is a persistent small right pneumothorax which has decreased in size compared with 03/24/2015.  There is no pleural effusion. There is no left pneumothorax. There is bilateral diffuse chronic interstitial lung disease. Stable cardiomediastinal silhouette. No acute osseous abnormality.  IMPRESSION: 1. Persistent small right pneumothorax which has decreased in size compared with  03/24/2015.   Electronically Signed   By: Kathreen Devoid   On: 03/28/2015 17:33   Dg Chest 2 View  03/24/2015   CLINICAL DATA:  Interstitial lung disease  EXAM: CHEST  2 VIEW  COMPARISON:  03/23/2015  FINDINGS: Interval removal of right chest tube. Small lateral pneumothorax along the right upper lobe is not changed in volume measuring 5 mm in depth. No change in the left central venous line which is in the left upper mediastinum. Diffuse patchy airspace disease is unchanged.  IMPRESSION: 1. No change in volume of small lateral right pneumothorax following right chest tube removal. 2. Stable bilateral patchy airspace disease.   Electronically Signed   By: Suzy Bouchard M.D.   On: 03/24/2015 09:25   Dg Chest 2 View  03/21/2015   CLINICAL DATA:  Preop right VATS.  Shortness of breath  EXAM: CHEST  2 VIEW  COMPARISON:  02/27/2015  FINDINGS: Heart size and pulmonary vascularity are normal. Chronic fibrosis throughout the lungs, similar to prior study. No superimposed consolidation. No blunting of costophrenic angles. No pneumothorax. Bilateral cervical ribs. Postoperative changes in the cervical spine and right upper quadrant.  IMPRESSION: Stable appearance to the chest with diffuse fibrosis and low lung volumes consistent with interstitial disease.   Electronically Signed   By: Lucienne Capers M.D.   On: 03/21/2015 06:46   Ct Angio Chest Pe W/cm &/or Wo Cm  03/29/2015   CLINICAL DATA:  Acute onset of shortness of breath. Initial encounter.  EXAM: CT ANGIOGRAPHY CHEST WITH CONTRAST  TECHNIQUE: Multidetector CT imaging of the chest was performed using the standard protocol during bolus administration of intravenous contrast. Multiplanar CT image reconstructions and MIPs were obtained to evaluate the vascular anatomy.  CONTRAST:  18mL OMNIPAQUE IOHEXOL 350 MG/ML SOLN  COMPARISON:  High-resolution CT of the chest performed 02/28/2015, and chest radiograph performed 03/28/2015  FINDINGS: There is no evidence of  significant pulmonary embolus. Evaluation for pulmonary embolus is suboptimal due to surrounding airspace opacification.  There has been rapid interval progression of scattered bilateral airspace opacification, which is thought to be superimposed on the patient's underlying interstitial lung disease. This most likely reflects an acute superimposed pneumonia; this degree of rapid progression of interstitial lung disease would be highly unusual. Underlying extensive areas of peripheral subpleural  and peribronchovascular reticulation are again noted, with marked thickening of the peribronchovascular interstitium, areas of ground-glass attenuation and mild honeycombing, and cylindrical and mild varicose bronchiectasis. A trace right-sided pneumothorax is again noted, reflecting recent biopsy. No pleural effusion is seen.  Scattered prominent bilateral hilar, subcarinal, azygoesophageal recess, periaortic and right paratracheal nodes are seen, measuring up to 1.4 cm in short axis. Trace air within the epicardial fat likely reflects the adjacent trace pneumothorax. Mild adjacent high attenuation may reflect post biopsy changes. No pericardial effusion is identified. The great vessels are grossly unremarkable in appearance. The thyroid gland is diminutive and grossly unremarkable. No axillary lymphadenopathy is seen.  The visualized portions of the liver and spleen are unremarkable. The patient is status post cholecystectomy, with clips noted along the gallbladder fossa. The visualized portions of the pancreas and adrenal glands are within normal limits.  No acute osseous abnormalities are seen.  Review of the MIP images confirms the above findings.  IMPRESSION: 1. No evidence of significant pulmonary embolus. 2. Rapid interval progression of scattered bilateral airspace opacification. This is thought to be superimposed on the underlying interstitial lung disease, and most likely reflects an acute superimposed pneumonia.  This degree of rapid progression in interstitial lung disease would be highly unusual. 3. Underlying interstitial lung disease again noted. As noted on high-resolution CT, this most likely reflects usual interstitial pneumonia, though severe fibrotic nonspecific interstitial pneumonia (NSIP) could have a similar appearance. 4. Trace right-sided pneumothorax reflects recent biopsy. 5. Bilateral hilar and mediastinal lymphadenopathy again noted.   Electronically Signed   By: Garald Balding M.D.   On: 03/29/2015 01:37   Dg Chest Port 1 View  03/30/2015   CLINICAL DATA:  Respiratory failure.  Followup right pneumothorax.  EXAM: PORTABLE CHEST - 1 VIEW  COMPARISON:  03/29/2015.  FINDINGS: Decreased size of a small right lateral pneumothorax. No significant change in enlargement of the cardiac silhouette, prominence of the interstitial markings and airspace opacity in the majority at the left lung. Thoracic spine degenerative changes and cervical spine fixation hardware.  IMPRESSION: 1. Approximately 5% right lateral pneumothorax with mild improvement. 2. No significant change in diffuse left lung alveolar edema or pneumonia. 3. Stable cardiomegaly and interstitial lung disease.   Electronically Signed   By: Claudie Revering M.D.   On: 03/30/2015 10:31   Dg Chest Port 1 View  03/29/2015   CLINICAL DATA:  Respiratory failure.  EXAM: PORTABLE CHEST - 1 VIEW  COMPARISON:  Chest CT earlier this day. Radiographs obtained yesterday per  FINDINGS: Small right lateral pneumothorax, unchanged. Lower lung volumes from prior, question mild progression of perihilar airspace disease, versus differences in technique. Underlying chronic lung disease again seen. Mediastinal contours are unchanged.  IMPRESSION: 1. Small right pneumothorax is unchanged from prior. 2. Question progressive perihilar airspace disease versus related to lower lung volumes.   Electronically Signed   By: Jeb Levering M.D.   On: 03/29/2015 04:58   Dg Chest  Port 1 View  03/23/2015   CLINICAL DATA:  Interstitial lung disease. History of diabetes, hypertension and asthma.  EXAM: PORTABLE CHEST - 1 VIEW  COMPARISON:  Mar 22, 2015  FINDINGS: The heart size and mediastinal contours are stable. A small right pneumothorax is unchanged. Diffuse patchy consolidation is identified throughout bilateral lungs unchanged. Left central venous line is identified unchanged. Right chest tube is noted unchanged. The visualized skeletal structures are stable.  IMPRESSION: Small right pneumothorax is unchanged.  Diffuse patchy consolidation identified throughout bilateral  lungs, unchanged.   Electronically Signed   By: Abelardo Diesel M.D.   On: 03/23/2015 08:40   Dg Chest Port 1 View  03/22/2015   CLINICAL DATA:  Status post VATS with lung biopsy  EXAM: PORTABLE CHEST - 1 VIEW  COMPARISON:  03/21/2015  FINDINGS: Cardiac shadow remains mildly enlarged. Diffuse fibrotic changes are again identified throughout both lungs. A the right-sided thoracostomy catheter is noted. A small lateral and apical pneumothorax is noted on the right which is new from the prior exam. Left jugular central line is again identified although now appears to be directed towards the superior intercostal vein. Postsurgical changes are noted in the cervical spine. No acute bony abnormality is seen.  IMPRESSION: Small right pneumothorax as described. The remainder of the exam is relatively stable  These results will be called to the ordering clinician or representative by the Radiologist Assistant, and communication documented in the PACS or zVision Dashboard.   Electronically Signed   By: Inez Catalina M.D.   On: 03/22/2015 07:39   Dg Chest Port 1 View  03/21/2015   CLINICAL DATA:  Interstitial lung disease  EXAM: PORTABLE CHEST - 1 VIEW  COMPARISON:  Portable exam 1106 hours compared to 03/21/2015  FINDINGS: LEFT jugular central venous catheter with tip projecting over LEFT brachiocephalic vein near SVC  confluence.  Cervical ribs noted bilaterally.  New RIGHT thoracostomy tube.  Staple line identified at inferior RIGHT lung compatible with lung biopsy.  Stable heart size.  Chronic interstitial lung disease/pulmonary fibrosis again seen.  Decreased lung volumes.  No gross pleural effusion or pneumothorax.  IMPRESSION: Pulmonary fibrosis.  No pneumothorax following RIGHT lung biopsy and thoracostomy tube placement.   Electronically Signed   By: Lavonia Dana M.D.   On: 03/21/2015 12:07    Microbiology: No results found for this or any previous visit (from the past 240 hour(s)).   Labs: Basic Metabolic Panel:  Recent Labs Lab 03/28/15 1720 03/28/15 2241 03/29/15 0255 03/30/15 0807 03/31/15 0511 04/02/15 0517  NA 138  --  137 134* 139 140  K 3.7  --  3.9 4.1 4.3 3.3*  CL 99*  --  101 98* 103 105  CO2  --   --  25 25 26 27   GLUCOSE 187*  --  105* 277* 177* 51*  BUN 14  --  10 16 19  25*  CREATININE 1.00 0.94 0.95 1.00 0.93 0.93  CALCIUM  --   --  9.4 8.8* 8.9 8.6*   Liver Function Tests:  Recent Labs Lab 03/29/15 0255  AST 19  ALT 17  ALKPHOS 108  BILITOT 0.3  PROT 7.2  ALBUMIN 2.7*   No results for input(s): LIPASE, AMYLASE in the last 168 hours. No results for input(s): AMMONIA in the last 168 hours. CBC:  Recent Labs Lab 03/28/15 1707 03/28/15 1720 03/29/15 0255 03/30/15 0807 03/31/15 0511  WBC 8.7  --  9.2 9.0 17.1*  NEUTROABS 5.2  --  5.3  --   --   HGB 10.1* 11.2* 10.5* 10.7* 10.3*  HCT 32.2* 33.0* 33.3* 33.4* 31.8*  MCV 87.3  --  87.4 85.9 86.9  PLT 432*  --  421* 426* 427*   Cardiac Enzymes:  Recent Labs Lab 03/28/15 2241  TROPONINI <0.03   BNP: BNP (last 3 results)  Recent Labs  02/27/15 1843 02/28/15 1152 03/28/15 2241  BNP 27.7 66.7 17.3    ProBNP (last 3 results) No results for input(s): PROBNP in the last 8760  hours.  CBG:  Recent Labs Lab 04/01/15 1605 04/01/15 2114 04/02/15 0603 04/02/15 0645 04/02/15 1116  GLUCAP 180* 229*  53* 92 179*       Signed:  Taysia Rivere  [facility] 04/02/2015, 1:52 PM

## 2015-04-02 NOTE — Progress Notes (Signed)
Inpatient Diabetes Program Recommendations  AACE/ADA: New Consensus Statement on Inpatient Glycemic Control (2013)  Target Ranges:  Prepandial:   less than 140 mg/dL      Peak postprandial:   less than 180 mg/dL (1-2 hours)      Critically ill patients:  140 - 180 mg/dL   Reason for Assessment of Glycemic control: Results for YARELI, CARTHEN (MRN [id number]) as of 04/02/2015 11:09  Ref. Range 04/01/2015 11:15 04/01/2015 16:05 04/01/2015 21:14 04/02/2015 06:03 04/02/2015 06:45  Glucose-Capillary Latest Ref Range: 65-99 mg/dL 211 (H) 180 (H) 229 (H) 53 (L) 92    Note that steroids tapered on 04/01/15 to PO Prednisone.  Consider reducing Lantus to 55 units q HS, due to low fasting CBG this morning.  Thanks, Adah Perl, RN, BC-ADM Inpatient Diabetes Coordinator Pager [phone] (8a-5p)

## 2015-04-02 NOTE — Care Management Note (Signed)
Case Management Note  Patient Details  Name: Joann [name] MRN: [id number] Date of Birth: [birth year]  Subjective/Objective:   Pt admitted with acute resp. failure                Action/Plan: PTA pt lived at home-, has home CPAP, has home 02 with [facility]  Expected Discharge Date:                  Expected Discharge Plan:  Home/Self Care  In-House Referral:     Discharge planning Services  CM Consult, Medication Assistance  Post Acute Care Choice:    Choice offered to:     DME Arranged:    DME Agency:     HH Arranged:    [place] Agency:     Status of Service:  Completed, signed off  Medicare Important Message Given:  No Date Medicare IM Given:    Medicare IM give by:    Date Additional Medicare IM Given:    Additional Medicare Important Message give by:     If discussed at [place] of Stay Meetings, dates discussed:  04/02/15  Additional Comments:  Pt for d/c home today, referral for new CPAP machine (no order placed for new machine)- per conversation with pt her CPAP is with [facility] and is greater than 40 yrs old- per Pulm. Notes plan is to work on a new CPAP post discharge- she has a f/u appointment with them on 04/09/15- per conversation with [facility] regarding CPAP- pt would need a new sleep study to verify settings on a new machine- pt will f/u with pulm. On her f/u appointment- pre-auth form for OFEV filled out and has been faxed to [facility] for approval.  No further CM needs noted.   Marvetta Gibbons [place], [phone] 04/02/2015, 3:03 PM

## 2015-04-02 NOTE — Progress Notes (Signed)
Hypoglycemic Event  CBG:53  Treatment: 2 cups orange juice  Symptoms: none  Follow-up CBG: Time:  0645 CBG Result:92  Possible Reasons for Event: Pt received lantus 65 units last night  Comments/MD notified: protocol followed    Vernon Prey  Remember to initiate Hypoglycemia Order Set & complete

## 2015-04-02 NOTE — Progress Notes (Signed)
Name:Joann [name] CONWAY EYC:[id number] DOB:1961/06/06   ADMISSION DATE: 03/28/2015 CONSULTATION DATE: 03/28/2015  REFERRING MD : Hal Hope  CHIEF COMPLAINT: SOB  BRIEF PATIENT DESCRIPTION: 54 y.o. F with recently diagnosed UIP with lymphoid hyperplasia (via VATS bx on 03/21/15), brought to [facility] ED with persistent SOB and increasing O2 requirements. PCCM consulted for recs.  Studies: CT chest 5/19: small R PTX. Extensive changes of fibrosis and pneumonitis - increased infiltrates compared to CT chest in April  Subjective: Anxious to go home.    Objective: Temp:  [97.4 F (36.3 C)-98.1 F (36.7 C)] 98.1 F (36.7 C) (05/24 1002) Pulse Rate:  [70-91] 91 (05/24 1002) Resp:  [18-21] 18 (05/24 1002) BP: (117-138)/(67-73) 117/71 mmHg (05/24 1002) SpO2:  [95 %-99 %] 95 % (05/24 1002) Weight:  [179 lb 14.3 oz (81.6 kg)] 179 lb 14.3 oz (81.6 kg) (05/24 1497)  Physical Exam: General: pleasant HEENT: no sinus tenderness Cardiac: regular Chest: b/l coarse crackles Abd: soft, non tender Ext: no edema Neuro: moves all extremities Skin: no rash  CMP Latest Ref Rng 04/02/2015 03/31/2015 03/30/2015  Glucose 65 - 99 mg/dL 51(L) 177(H) 277(H)  BUN 6 - 20 mg/dL 25(H) 19 16  Creatinine 0.44 - 1.00 mg/dL 0.93 0.93 1.00  Sodium 135 - 145 mmol/L 140 139 134(L)  Potassium 3.5 - 5.1 mmol/L 3.3(L) 4.3 4.1  Chloride 101 - 111 mmol/L 105 103 98(L)  CO2 22 - 32 mmol/L 27 26 25   Calcium 8.9 - 10.3 mg/dL 8.6(L) 8.9 8.8(L)  Total Protein 6.5 - 8.1 g/dL - - -  Total Bilirubin 0.3 - 1.2 mg/dL - - -  Alkaline Phos 38 - 126 U/L - - -  AST 15 - 41 U/L - - -  ALT 14 - 54 U/L - - -    CBC Latest Ref Rng 03/31/2015 03/30/2015 03/29/2015  WBC 4.0 - 10.5 K/uL 17.1(H) 9.0 9.2  Hemoglobin 12.0 - 15.0 g/dL 10.3(L) 10.7(L) 10.5(L)  Hematocrit 36.0 - 46.0 % 31.8(L) 33.4(L) 33.3(L)  Platelets 150 - 400 K/uL 427(H) 426(H) 421(H)     Dg Chest 2 View  04/01/2015   CLINICAL DATA:  Pulmonary fibrosis .   EXAM: CHEST  2 VIEW  COMPARISON:  Chest x-ray 03/29/2015 and 03/21/2015. CT chest 03/29/2015 and 02/28/2015.  FINDINGS: Mediastinum and hilar structures are stable. Heart size is stable. Diffuse pulmonary interstitial prominence again noted consistent with chronic interstitial lung disease. Previously identified right pneumothorax has resolved. Prior cervical spine fusion. Prior cholecystectomy. Surgical clips left upper quadrant.  IMPRESSION: 1. Unchanged bilateral pulmonary interstitial prominence consistent with chronic interstitial lung disease. Superimposed interstitial edema and/or pneumonitis cannot be excluded. 2. Interim resolution of right pneumothorax.   Electronically Signed   By: Marcello Moores  Register   On: 04/01/2015 07:37    Assessment/Plan:  Acute on chronic hypoxic respiratory failure in setting of IPF/UIP >> ? Pneumonitis vs infection vs edema (probably combination of all).  Has elevated RF ?significance >> no other evidence for RA. Plan: - adjust oxygen to keep SpO2 90 to 95% - f/u CXR as outpt - transitioned to oral prednisone 5/23 >> change to 30 mg daily on 5/25 and continue this until outpt pulmonary follow up - Day 6/7 of Abx, currently on levaquin - prior authorization form for OFEV completed in Dr. Golden Pop name >> he will be prescribing provider as outpt  Hx of OSA. Plan: - qhs CPAP >> she will need new CPAP machine after discharge >> her machine is more than 55 yrs old  Hx of asthma. Plan: - resumed advair - prn albuterol  I have scheduled her for pulmonary follow up with Tammy Parrett on Tuesday, 02/07/15 at 9 am.  She then has f/u with Dr. Chase Caller on 04/30/15.  Her pulmonary status is stable for d/c home.  Chesley Mires, MD [facility] Pulmonary/Critical Care 04/02/2015, 10:04 AM Pager:  [phone] After 3pm call: [phone]

## 2015-04-03 ENCOUNTER — Telehealth: Payer: Self-pay | Admitting: Internal Medicine

## 2015-04-03 NOTE — Telephone Encounter (Signed)
Pt states she doesn't have a specialty pharmacy. Please advise on where I can find the information to follow up on this. Pt states the only pharmacy she ever uses is [facility]. Thanks

## 2015-04-03 NOTE — Telephone Encounter (Signed)
Pt is scheduled to follow up with TP for HFU 04/09/2015 Pt has appt with MR on 04/30/2015 with PFT on 04/29/15 Nothing further needed.

## 2015-04-04 NOTE — Telephone Encounter (Signed)
LMTCB-we need to know who is starting her on Esbriet.  I spoke with Daneil Dan and she states MR did not start pt on Esbriet.

## 2015-04-05 NOTE — Telephone Encounter (Signed)
Called and spoke to pt. Pt stated she was started on Esbriet while in hospital and the pt's case manager got approval for Esbriet on 5/25. Informed pt the speciality pharmacy will contact her to schedule a shipment. Pt verbalized understanding and denied any further questions or concerns at this time.   Will send to MR as FYI.

## 2015-04-05 NOTE — Telephone Encounter (Signed)
Pt returning call.Joann [name] ° °

## 2015-04-09 ENCOUNTER — Ambulatory Visit (INDEPENDENT_AMBULATORY_CARE_PROVIDER_SITE_OTHER)
Admission: RE | Admit: 2015-04-09 | Discharge: 2015-04-09 | Disposition: A | Discharge status: Home / Self Care | Payer: 59 | Source: Ambulatory Visit | Attending: Adult Health | Admitting: Adult Health

## 2015-04-09 ENCOUNTER — Telehealth: Payer: Self-pay | Admitting: Internal Medicine

## 2015-04-09 ENCOUNTER — Encounter: Payer: Self-pay | Admitting: Adult Health

## 2015-04-09 ENCOUNTER — Ambulatory Visit (INDEPENDENT_AMBULATORY_CARE_PROVIDER_SITE_OTHER): Payer: 59 | Admitting: Adult Health

## 2015-04-09 VITALS — BP 122/84 | HR 82 | Temp 98.2°F | Ht 60.0 in | Wt 178.0 lb

## 2015-04-09 DIAGNOSIS — J9611 Chronic respiratory failure with hypoxia: Secondary | ICD-10-CM | POA: Diagnosis not present

## 2015-04-09 DIAGNOSIS — J939 Pneumothorax, unspecified: Secondary | ICD-10-CM | POA: Insufficient documentation

## 2015-04-09 DIAGNOSIS — J849 Interstitial pulmonary disease, unspecified: Secondary | ICD-10-CM | POA: Diagnosis not present

## 2015-04-09 MED ORDER — PIRFENIDONE 267 MG PO CAPS: ORAL_CAPSULE | ORAL | Status: DC

## 2015-04-09 MED ORDER — PREDNISONE 10 MG PO TABS: ORAL_TABLET | ORAL | Status: DC

## 2015-04-09 NOTE — Addendum Note (Signed)
Addended by: Parke Poisson E on: 04/09/2015 11:14 AM   Modules accepted: Orders

## 2015-04-09 NOTE — Telephone Encounter (Signed)
She has had this since VATS  Clinically improved  Has follow up with [facility] next week.  She did not start on Esbriet in hospital  Was ordered today

## 2015-04-09 NOTE — Assessment & Plan Note (Signed)
Persistent small right sided PTX s/p VATS bx on 5/12 O2 demand is at baseline.  Asymptomatic  Remains small   Will cont to follow with cxr  follow up with TS next week as planned

## 2015-04-09 NOTE — Progress Notes (Signed)
Subjective:    Patient ID: Joann [name], female    DOB: 1961/05/23, 54 y.o.   MRN: [id number]  HPI 54 yo   03/13/2015 [facility] follow up  Pt returns for a post hospital follow up for acute resp failure .  Seen for pulmonary consult during hospital admit.  HRCT showed ILD changes , favoring NSIP .  2-D echo results showed EF of 94-17%, grade 1 diastolic dysfunction Autoimmune panel neg except RA factor positive  FH of RA.  Discharged on O2 . 2l/m rest and 3l/m act .  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  Breathing has not changed much , has DOE with minmal activity .  Spirometry shows severe restriction with FVC 44%, ratio 93 and FEV 1 48%.  Denies cp , hemoptyiss , edema or feve.r  >.refer TS for bx   04/09/2015 [facility] follow up  Patient returns for a post hospital follow-up. Patient was recently found to have ILD changes on her CT scan. Patient underwent a VATS biopsy by Dr. Caffie Pinto on May 12. That confirmed UIP with lymphoid hyperplasia. She was discharged on May 17. Patient returned to the hospital on May 19 with respiratory distress. With increased oxygen demand. Patient was suspected to have healthcare associated pneumonia with an exacerbation of her UIP She was treated with aggressive IV anabiotic and changes positions over to Levaquin prior to discharge. She did require some gentle diuresis. She was treated with IV steroid-dependent and transitioned to prednisone 30 mg prior to discharge. She has had a small right sided pneumothorax. Since discharge. Patient says she is starting to feel improved. Oxygen demands are  back at her baseline. She is currently on oxygen 3 l/m with act and 2l/m rest  CXR today shows residual right  PTX .  Currently on prednisone 30mg  daily .  She denies any hemoptysis, orthopnea, PND, leg swelling, or fever. Remains on disability at work. Wears out easily with min activity.  Was to be started on esbriet during hospital stay thru specialty  pharmacy but did get started.  We discussed drug, side effects with pt eduation.  On CPAP At bedtime  . Needs new machine but DME requires a new study.  Not followed by anyone for OSA , dx >10 yr ago.  Wears CPAP each night .        Review of Systems Constitutional:   No  weight loss, night sweats,  Fevers, chills, + fatigue, or  lassitude.  HEENT:   No headaches,  Difficulty swallowing,  Tooth/dental problems, or  Sore throat,                No sneezing, itching, ear ache, nasal congestion, post nasal drip,   CV:  No chest pain,  Orthopnea, PND, swelling in lower extremities, anasarca, dizziness, palpitations, syncope.   GI  No heartburn, indigestion, abdominal pain, nausea, vomiting, diarrhea, change in bowel habits, loss of appetite, bloody stools.   Resp:  .  No chest wall deformity  Skin: no rash or lesions.  GU: no dysuria, change in color of urine, no urgency or frequency.  No flank pain, no hematuria   MS:  No joint pain or swelling.  No decreased range of motion.  No back pain.  Psych:  No change in mood or affect. No depression or anxiety.  No memory loss.         Objective:   Physical Exam GEN: A/Ox3; pleasant , NAD  HEENT:  [place]/AT,  EACs-clear, TMs-wnl, NOSE-clear, THROAT-clear, no lesions, no postnasal drip or exudate noted.   NECK:  Supple w/ fair ROM; no JVD; normal carotid impulses w/o bruits; no thyromegaly or nodules palpated; no lymphadenopathy.  RESP  Faint basilar rales no accessory muscle use, no dullness to percussion  CARD:  RRR, no m/r/g  , no peripheral edema, pulses intact, no cyanosis or clubbing.  GI:   Soft & nt; nml bowel sounds; no organomegaly or masses detected.  Musco: Warm bil, no deformities or joint swelling noted.   Neuro: alert, no focal deficits noted.    Skin: Warm, no lesions or rashes    CXR .04/09/2015  A small right-sided pneumothorax is again suspected. 2. Stable cardiomegaly and interstitial lung disease       Assessment & Plan:

## 2015-04-09 NOTE — Patient Instructions (Addendum)
Continue on Prednisone 30mg  for 1 week then 20mg  daily and hold.  We will check on specialty pharmacy for [place] .  Continue on Oxygen 2l/m rest and 3l/m with activity.  We will set you up for a sleep study .  Rheumatology referral .  Refer to pulmonary rehab.  Follow up Dr. Chase Caller in 1 month and As needed  .  Follow up with Dr. Cyndia Bent next week as planned  Please contact office for sooner follow up if symptoms do not improve or worsen or seek emergency care

## 2015-04-09 NOTE — Telephone Encounter (Signed)
Thanks

## 2015-04-09 NOTE — Telephone Encounter (Signed)
Joann [name] she is new dx of IPF. She is seeing you today. For completion sake she should see rheum. She needs fu in 3 weeks for LFT check. If she has tol;erance issues with esbriet let me know please

## 2015-04-09 NOTE — Telephone Encounter (Signed)
CXR results:  Right sided Pneumothorax, Stable Cardiomegaly, ILD.

## 2015-04-09 NOTE — Assessment & Plan Note (Signed)
Cont on CPAP  Set up for split night study , will send result for new machine once available .  Once done will get in with sleep to have routine follow up

## 2015-04-09 NOTE — Assessment & Plan Note (Signed)
Cont on current regimen  With O2 2lm/ rest and 3l with act

## 2015-04-09 NOTE — Telephone Encounter (Signed)
Discussed with patient  -she said after my discussion of the 2 drugs ofev v esbriet , hospital pharmacist spoke to her as well. She decided on esbriet. She is comfortable with the decision. She said drug will arrive in a few days  - re prednisone - rec 30mg  x 1 week then 20mg  x 1 week and then pred 10mg  to continue till she sees me 04/30/15 at that point I will work on taper off

## 2015-04-09 NOTE — Telephone Encounter (Signed)
Tammy  I am not so convinced about a Rt PTx. Needs close monitoring if she is ok clinically  Thanks  Dr. Brand Males, M.D., [facility].C.P Pulmonary and Critical Care Medicine Staff Physician [place] [facility] Pager: [phone], If no answer or between  15:00h - 7:00h: call [phone]  04/09/2015 10:17 AM

## 2015-04-09 NOTE — Assessment & Plan Note (Signed)
VATS biopsy by Dr. Caffie Pinto on May 12. That confirmed UIP with lymphoid hyperplasia Pt to start on Esbriet , will get rx her [facility] .  She will start on slow titration up over next 3 weeks.  Check LFT on return  Will cont on steroids with slow wean to 20mg  daily and hold  Autoimmune panel neg except RA factor positive  FH of RA.-father had RA and RA ILD    Plan  Continue on Prednisone 30mg  for 1 week then 20mg  daily and hold.  We will check on specialty pharmacy for [place] .  Continue on Oxygen 2l/m rest and 3l/m with activity.  We will set you up for a sleep study .  Rheumatology referral .  Refer to pulmonary rehab.  Follow up Dr. Chase Caller in 1 month and As needed  .  Follow up with Dr. Cyndia Bent next week as planned  Please contact office for sooner follow up if symptoms do not improve or worsen or seek emergency care

## 2015-04-12 ENCOUNTER — Telehealth: Payer: Self-pay | Admitting: *Deleted

## 2015-04-12 DIAGNOSIS — J84112 Idiopathic pulmonary fibrosis: Secondary | ICD-10-CM

## 2015-04-12 NOTE — Telephone Encounter (Signed)
Received rejection from pharmacy for [place] 267 with request to obtain PA. PA submitted by covermymeds Key: [id number]

## 2015-04-15 NOTE — Telephone Encounter (Signed)
Please advise Dr Chase Caller if you wish to do appeal for Esbriet or change to something else. Thanks.

## 2015-04-15 NOTE — Telephone Encounter (Signed)
PAtietn had normal LFT 03/29/15 . Please have Daneil Dan take over the paper work from [place] out patient if needed. When she was in [place] had organized with [facility] and that is what happened. HE did not know office is more proficient with this. She has biopsy proven IPF

## 2015-04-15 NOTE — Telephone Encounter (Signed)
Pts insurance has denied Esbriet 267 mg. Pt needs to have liver function panel drawn to submit with appeal. First level appeal: If your initial request was denied and your letter states that this is a "Notice of Denial of Initial Request," your next step is a first lever internal appeal: Provide additional info about your request to fax [phone].

## 2015-04-15 NOTE — Telephone Encounter (Signed)
Called [facility], spoke with Caryl Pina, given number to contact PA Dept to initiate Appeal for [place]. Called [phone] spoke with Olegario Shearer - patient has a different ID # than what we have on chart, correct Member ID [id number] States that medication coverage should be approved if proof of LFT being done prior to treatment start can be proven along with CT or Biopsy proven IPF I advised that both can be verified in patient chart, unable to take verbal on any of this information, must have Appeal Form filled out. Appeal form is being faxed to triage to be completed and faxed back for coverage determination. Will send to [facility] as Juluis Rainier

## 2015-04-15 NOTE — Telephone Encounter (Signed)
[facility] called about this denial and wants to know if we are going to appeal   [place]

## 2015-04-16 ENCOUNTER — Ambulatory Visit (HOSPITAL_BASED_OUTPATIENT_CLINIC_OR_DEPARTMENT_OTHER): Payer: 59 | Attending: Adult Health | Admitting: Radiology

## 2015-04-16 VITALS — Ht 60.0 in | Wt 180.0 lb

## 2015-04-16 DIAGNOSIS — G4730 Sleep apnea, unspecified: Secondary | ICD-10-CM | POA: Insufficient documentation

## 2015-04-16 DIAGNOSIS — J849 Interstitial pulmonary disease, unspecified: Secondary | ICD-10-CM

## 2015-04-16 DIAGNOSIS — G4710 Hypersomnia, unspecified: Secondary | ICD-10-CM | POA: Diagnosis present

## 2015-04-17 ENCOUNTER — Other Ambulatory Visit: Payer: Self-pay | Admitting: Surgery

## 2015-04-17 ENCOUNTER — Ambulatory Visit: Payer: 59 | Admitting: Surgery

## 2015-04-17 DIAGNOSIS — J849 Interstitial pulmonary disease, unspecified: Secondary | ICD-10-CM

## 2015-04-18 NOTE — Telephone Encounter (Signed)
Pt calling to check on the status of her Esbriet PA/Appeal Elise/triage, can an update be provided for pt?  Thanks!

## 2015-04-18 NOTE — Telephone Encounter (Signed)
Joann [name] did you receive the appeal form? thanks

## 2015-04-19 ENCOUNTER — Ambulatory Visit
Admission: RE | Admit: 2015-04-19 | Discharge: 2015-04-19 | Disposition: A | Discharge status: Home / Self Care | Payer: 59 | Source: Ambulatory Visit | Attending: Surgery | Admitting: Surgery

## 2015-04-19 ENCOUNTER — Ambulatory Visit (INDEPENDENT_AMBULATORY_CARE_PROVIDER_SITE_OTHER): Payer: Self-pay | Admitting: Surgery

## 2015-04-19 ENCOUNTER — Encounter: Payer: Self-pay | Admitting: Surgery

## 2015-04-19 VITALS — BP 119/77 | HR 93 | Resp 18 | Ht 60.0 in | Wt 178.0 lb

## 2015-04-19 DIAGNOSIS — Z9889 Other specified postprocedural states: Secondary | ICD-10-CM

## 2015-04-19 DIAGNOSIS — J84112 Idiopathic pulmonary fibrosis: Secondary | ICD-10-CM

## 2015-04-19 DIAGNOSIS — J849 Interstitial pulmonary disease, unspecified: Secondary | ICD-10-CM

## 2015-04-19 MED ORDER — OXYCODONE HCL 5 MG PO TABS: 5.0000 mg | ORAL_TABLET | ORAL | Status: DC | PRN

## 2015-04-19 NOTE — Telephone Encounter (Signed)
Pt does not have any recent LFT's, pt only has a CMP.  TP please advise if an LFT can be ordered. Thanks.

## 2015-04-19 NOTE — Telephone Encounter (Signed)
Appeals form has been completed and faxed back to [facility] for signature by TP. Also need to attach proof of most recent LFT.

## 2015-04-19 NOTE — Progress Notes (Signed)
HPI: Patient returns for routine postoperative follow-up having undergone right VATS with wedge biopsy of the lung on 03/21/2015. The patient's early postoperative recovery while in the hospital was notable for a slow postop course due to hypoxemia. She was discharge on home oxygen but readmitted a few days later with hypoxemia and shortness of breath. Her final pathology showed UIP with lymphoid hyperplasia. She was started on Prednisone and Pirfenidone by pulmonary medicine although she has not started the later yet. She says that her breathing is stable but short with any activity. She still has some right chest wall pain and is taking 1 or 2 oxycodone IR pills per day.     Current Outpatient Prescriptions  Medication Sig Dispense Refill  . albuterol (PROVENTIL HFA;VENTOLIN HFA) 108 (90 BASE) MCG/ACT inhaler Inhale 2 puffs into the lungs every 6 (six) hours as needed. For shortness of breath    . ALPRAZolam (XANAX) 0.5 MG tablet Take 0.5 mg by mouth daily as needed for anxiety.     Marland Kitchen amLODipine (NORVASC) 5 MG tablet Take 5 mg by mouth every morning.     . cholecalciferol (VITAMIN D) 1000 UNITS tablet Take 1,000 Units by mouth daily.    Marland Kitchen dicyclomine (BENTYL) 20 MG tablet Take 20 mg by mouth every 6 (six) hours.    . DULoxetine (CYMBALTA) 60 MG capsule Take 60 mg by mouth daily.    Marland Kitchen esomeprazole (NEXIUM) 40 MG capsule Take 40 mg by mouth 2 (two) times daily before a meal.     . estrogens, conjugated, (PREMARIN) 0.625 MG tablet Take 0.625 mg by mouth daily.     . Fluticasone-Salmeterol (ADVAIR) 100-50 MCG/DOSE AEPB Inhale 1 puff into the lungs as needed (shortness of breath).     Marland Kitchen guaiFENesin (MUCINEX) 600 MG 12 hr tablet Take 1 tablet (600 mg total) by mouth 2 (two) times daily.    . insulin aspart (NOVOLOG) 100 UNIT/ML injection Inject 0-15 Units into the skin 3 (three) times daily with meals. Sliding scale with meals    . insulin glargine (LANTUS) 100 UNIT/ML injection Inject 0.55  mLs (55 Units total) into the skin at bedtime.    Marland Kitchen levothyroxine (SYNTHROID, LEVOTHROID) 200 MCG tablet Take 200 mcg by mouth daily before breakfast.    . meclizine (ANTIVERT) 25 MG tablet Take 25 mg by mouth 3 (three) times daily as needed for dizziness.    . metFORMIN (GLUCOPHAGE-XR) 500 MG 24 hr tablet Take 1,000 mg by mouth 2 (two) times daily.    . ondansetron (ZOFRAN) 4 MG tablet Take 4 mg by mouth every 8 (eight) hours as needed for nausea or vomiting.    Marland Kitchen oxyCODONE (OXY IR/ROXICODONE) 5 MG immediate release tablet Take 1-2 tablets (5-10 mg total) by mouth every 4 (four) hours as needed for severe pain. 30 tablet 0  . Pirfenidone (ESBRIET) 267 MG CAPS Days 1-7: 1 cap three times daily.  Days 8-14: 2 caps three times daily, Days 15 onward: 3 caps three times daily. 270 capsule 5  . predniSONE (DELTASONE) 10 MG tablet 30mg  for 1 week then 20mg  daily and hold 75 tablet 1  . topiramate (TOPAMAX) 25 MG tablet Take 25 mg by mouth 2 (two) times daily.    . traZODone (DESYREL) 150 MG tablet Take 75 mg by mouth at bedtime.    . triamterene-hydrochlorothiazide (MAXZIDE-25) 37.5-25 MG per tablet Take 1 tablet by mouth every evening.    . vitamin E 400 UNIT capsule Take  800 Units by mouth daily.     No current facility-administered medications for this visit.    Physical Exam: BP 119/77 mmHg  Pulse 93  Resp 18  Ht 5' (1.524 m)  Wt 178 lb (80.74 kg)  BMI 34.76 kg/m2  SpO2 96% She looks well Lung exam reveals bibasilar crackles The right chest incisions are healing well  Diagnostic Tests:   CLINICAL DATA: Interstitial lung disease. Shortness of breath and pain in the chest. Recent lung biopsy.  EXAM: CHEST 2 VIEW  COMPARISON: 04/09/2015  FINDINGS: A tiny right apical pneumothorax (less than 5%) could persist, but would be unchanged. Stable appearance of fibrotic lung disease with low lung volumes. Stable heart size and mediastinal contours, including bilateral hilar and  mediastinal lymphadenopathy. There is no edema, consolidation, effusion, or pneumothorax.  IMPRESSION: 1. An interface at the right apex could be osseous or from stable trace pneumothorax. 2. Pulmonary fibrosis without new opacity.   Electronically Signed  By: Monte Fantasia M.D.  On: 04/19/2015 10:55        Impression:  She is recovering fairly well following her lung biopsy. She can return to her normal activity as tolerated. I renewed her Oxy IR today since she may have some chest wall pain for several more weeks.  Plan:  She will continue follow up with Dr. Chase Caller and her PCP.   Gaye Pollack, MD [facility] [phone]

## 2015-04-19 NOTE — Telephone Encounter (Signed)
Appeal signed by TP - will fax back Per TP: the CMET does have everything that an LFT does, but if this is what they are requiring okay for LFT.  Thanks.  Called spoke with patient, advised of appeal form signed and asked if she could come for the LFT.  Pt okay with this and will come on Monday.  Order for LFT placed.  Form faxed.  Will route back to myself for continued follow up.

## 2015-04-20 ENCOUNTER — Telehealth: Payer: Self-pay | Admitting: Internal Medicine

## 2015-04-20 DIAGNOSIS — G4733 Obstructive sleep apnea (adult) (pediatric): Secondary | ICD-10-CM | POA: Diagnosis not present

## 2015-04-20 NOTE — Telephone Encounter (Signed)
Joann [name]  Please find out if esbriet process is going well  Thanks  Dr. Brand Males, M.D., [facility].C.P Pulmonary and Critical Care Medicine Staff Physician [place] [facility] Pager: [phone], If no answer or between  15:00h - 7:00h: call [phone]  04/20/2015 1:03 PM

## 2015-04-20 NOTE — Sleep Study (Signed)
   NAME: Joann [name] DATE OF BIRTH:  04/28/1961 MEDICAL RECORD NUMBER [id number]  LOCATION: [facility]  PHYSICIAN: Kathee Delton  DATE OF STUDY: 04/16/2015  SLEEP STUDY TYPE: Nocturnal Polysomnogram               REFERRING PHYSICIAN: Parrett, Tammy S, NP  INDICATION FOR STUDY: Hypersomnia with sleep apnea  EPWORTH SLEEPINESS SCORE:  8 HEIGHT: 5' (152.4 cm)  WEIGHT: 180 lb (81.647 kg)    Body mass index is 35.15 kg/(m^2).  NECK SIZE: 16.5 in.  MEDICATIONS: Reviewed in the sleep record  SLEEP ARCHITECTURE: The patient had a total sleep time of 394 minutes with no slow-wave sleep and significant quantity of REM. Sleep onset latency was normal at 6 minutes, and REM onset was delayed at 197 minutes. Sleep efficiency was excellent at 99%.  RESPIRATORY DATA: The patient was found to have 2 central apneas and no obstructive hypoxemia is, giving her an AHI of only 0.5 events per hour. The events occurred in all body positions, and there was moderate snoring noted throughout.  OXYGEN DATA: There was oxygen desaturation only as low as 92% during the night, however the study was done on 2 L of nasal oxygen as she normally wears at home.  CARDIAC DATA: Occasional PAC and PVC noted  MOVEMENT/PARASOMNIA: No significant periodic limb movements or abnormal behaviors were seen  IMPRESSION/ RECOMMENDATION:    1) small numbers of central events which do not meet the AHI criteria for the obstructive sleep apnea syndrome. The patient had more than adequate total sleep time and REM to exhibit clinically significant sleep disordered breathing. She should be encouraged to work aggressively on weight loss.  2) oxygen desaturation as low as 92%, however the study was done on 2 L of nasal oxygen as she normally wears at home.  3) occasional PAC and PVC noted, no clinically significant arrhythmias were seen.    [place], American Board of Sleep  Medicine  ELECTRONICALLY SIGNED ON:  04/20/2015, 4:59 PM [place] PH: [phone]   FX: [phone] [place]

## 2015-04-22 NOTE — Telephone Encounter (Signed)
Devona Konig, CMA at 04/22/2015 8:29 AM     Status: Signed       Expand All Collapse All   Esbriet has been APPROVED 04/19/2015-04/18/2016. Case Id. [id number] Plan Member Id: [id number] Patient/Pharmacy will be notified.       Nothing further needed.

## 2015-04-22 NOTE — Telephone Encounter (Signed)
Esbriet has been APPROVED 04/19/2015-04/18/2016. Case Id. [id number] Plan Member Id: [id number] Patient/Pharmacy will be notified.

## 2015-04-22 NOTE — Telephone Encounter (Signed)
Thanks. Got call from Dr Estanislado Pandy 04/22/2015 9:11 AM - no evidence of RA on clinicalexam except R elbow nodule that she thinks she will biopsy via derm. 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 Otherwise, maintan IPF as dx

## 2015-04-23 ENCOUNTER — Telehealth: Payer: Self-pay | Admitting: Adult Health

## 2015-04-23 ENCOUNTER — Encounter (HOSPITAL_COMMUNITY): Payer: Self-pay | Admitting: *Deleted

## 2015-04-23 ENCOUNTER — Inpatient Hospital Stay (HOSPITAL_COMMUNITY)
Admission: EM | Admit: 2015-04-23 | Discharge: 2015-04-25 | DRG: 189 | Disposition: A | Discharge status: Home / Self Care | Payer: 59 | Attending: Internal Medicine | Admitting: Internal Medicine

## 2015-04-23 ENCOUNTER — Other Ambulatory Visit (INDEPENDENT_AMBULATORY_CARE_PROVIDER_SITE_OTHER): Payer: 59

## 2015-04-23 ENCOUNTER — Emergency Department (HOSPITAL_COMMUNITY): Payer: 59

## 2015-04-23 ENCOUNTER — Telehealth: Payer: Self-pay | Admitting: Pulmonary Disease

## 2015-04-23 DIAGNOSIS — Z888 Allergy status to other drugs, medicaments and biological substances status: Secondary | ICD-10-CM

## 2015-04-23 DIAGNOSIS — Z9981 Dependence on supplemental oxygen: Secondary | ICD-10-CM

## 2015-04-23 DIAGNOSIS — J8410 Pulmonary fibrosis, unspecified: Secondary | ICD-10-CM

## 2015-04-23 DIAGNOSIS — Z91040 Latex allergy status: Secondary | ICD-10-CM

## 2015-04-23 DIAGNOSIS — J84112 Idiopathic pulmonary fibrosis: Secondary | ICD-10-CM | POA: Diagnosis not present

## 2015-04-23 DIAGNOSIS — M797 Fibromyalgia: Secondary | ICD-10-CM | POA: Diagnosis present

## 2015-04-23 DIAGNOSIS — R609 Edema, unspecified: Secondary | ICD-10-CM

## 2015-04-23 DIAGNOSIS — Z794 Long term (current) use of insulin: Secondary | ICD-10-CM

## 2015-04-23 DIAGNOSIS — E039 Hypothyroidism, unspecified: Secondary | ICD-10-CM | POA: Diagnosis present

## 2015-04-23 DIAGNOSIS — Z825 Family history of asthma and other chronic lower respiratory diseases: Secondary | ICD-10-CM

## 2015-04-23 DIAGNOSIS — K219 Gastro-esophageal reflux disease without esophagitis: Secondary | ICD-10-CM | POA: Diagnosis present

## 2015-04-23 DIAGNOSIS — Z79899 Other long term (current) drug therapy: Secondary | ICD-10-CM

## 2015-04-23 DIAGNOSIS — R0902 Hypoxemia: Secondary | ICD-10-CM

## 2015-04-23 DIAGNOSIS — Z90710 Acquired absence of both cervix and uterus: Secondary | ICD-10-CM

## 2015-04-23 DIAGNOSIS — K589 Irritable bowel syndrome without diarrhea: Secondary | ICD-10-CM | POA: Diagnosis present

## 2015-04-23 DIAGNOSIS — J45909 Unspecified asthma, uncomplicated: Secondary | ICD-10-CM | POA: Diagnosis present

## 2015-04-23 DIAGNOSIS — F329 Major depressive disorder, single episode, unspecified: Secondary | ICD-10-CM | POA: Diagnosis present

## 2015-04-23 DIAGNOSIS — Z981 Arthrodesis status: Secondary | ICD-10-CM

## 2015-04-23 DIAGNOSIS — J9621 Acute and chronic respiratory failure with hypoxia: Secondary | ICD-10-CM | POA: Diagnosis not present

## 2015-04-23 DIAGNOSIS — Z7952 Long term (current) use of systemic steroids: Secondary | ICD-10-CM

## 2015-04-23 DIAGNOSIS — R599 Enlarged lymph nodes, unspecified: Secondary | ICD-10-CM | POA: Diagnosis present

## 2015-04-23 DIAGNOSIS — R0602 Shortness of breath: Secondary | ICD-10-CM | POA: Diagnosis not present

## 2015-04-23 DIAGNOSIS — J9601 Acute respiratory failure with hypoxia: Secondary | ICD-10-CM

## 2015-04-23 DIAGNOSIS — Z882 Allergy status to sulfonamides status: Secondary | ICD-10-CM

## 2015-04-23 DIAGNOSIS — G4733 Obstructive sleep apnea (adult) (pediatric): Secondary | ICD-10-CM | POA: Diagnosis present

## 2015-04-23 DIAGNOSIS — R600 Localized edema: Secondary | ICD-10-CM | POA: Diagnosis present

## 2015-04-23 DIAGNOSIS — Z91010 Allergy to peanuts: Secondary | ICD-10-CM

## 2015-04-23 DIAGNOSIS — I10 Essential (primary) hypertension: Secondary | ICD-10-CM | POA: Diagnosis present

## 2015-04-23 DIAGNOSIS — J849 Interstitial pulmonary disease, unspecified: Secondary | ICD-10-CM | POA: Diagnosis present

## 2015-04-23 DIAGNOSIS — E119 Type 2 diabetes mellitus without complications: Secondary | ICD-10-CM

## 2015-04-23 LAB — BASIC METABOLIC PANEL
Anion gap: 10 (ref 5–15)
BUN: 20 mg/dL (ref 6–20)
CO2: 25 mmol/L (ref 22–32)
Calcium: 9.3 mg/dL (ref 8.9–10.3)
Chloride: 103 mmol/L (ref 101–111)
Creatinine, Ser: 1.04 mg/dL — ABNORMAL HIGH (ref 0.44–1.00)
GFR calc Af Amer: >60 mL/min (ref >60)
GFR, EST NON AFRICAN AMERICAN: 60 mL/min — AB (ref >60)
Glucose, Bld: 141 mg/dL — ABNORMAL HIGH (ref 65–99)
POTASSIUM: 4.2 mmol/L (ref 3.5–5.1)
Sodium: 138 mmol/L (ref 135–145)

## 2015-04-23 LAB — I-STAT TROPONIN, ED: Troponin i, poc: 0 ng/mL (ref 0.00–0.08)

## 2015-04-23 LAB — CBC
HEMATOCRIT: 31.5 % — AB (ref 36.0–46.0)
Hemoglobin: 10.1 g/dL — ABNORMAL LOW (ref 12.0–15.0)
MCH: 27.6 pg (ref 26.0–34.0)
MCHC: 32.1 g/dL (ref 30.0–36.0)
MCV: 86.1 fL (ref 78.0–100.0)
Platelets: 302 10*3/uL (ref 150–400)
RBC: 3.66 MIL/uL — ABNORMAL LOW (ref 3.87–5.11)
RDW: 15.2 % (ref 11.5–15.5)
WBC: 10 10*3/uL (ref 4.0–10.5)

## 2015-04-23 LAB — HEPATIC FUNCTION PANEL
ALK PHOS: 74 U/L (ref 39–117)
ALT: 22 U/L (ref 0–35)
AST: 14 U/L (ref 0–37)
Albumin: 3.6 g/dL (ref 3.5–5.2)
BILIRUBIN DIRECT: 0.1 mg/dL (ref 0.0–0.3)
TOTAL PROTEIN: 6.5 g/dL (ref 6.0–8.3)
Total Bilirubin: 0.3 mg/dL (ref 0.2–1.2)

## 2015-04-23 LAB — BRAIN NATRIURETIC PEPTIDE: B NATRIURETIC PEPTIDE 5: 77.3 pg/mL (ref 0.0–100.0)

## 2015-04-23 NOTE — Telephone Encounter (Signed)
Called by Erielle d/t increased SOB, fluid retention/leg swelling and chest pressure. PMH Asthma, ILD, OSA. Started on Prednisone for ILD 3 weeks ago. Given her complex medical history and the symptoms that she complains of tonight I advised her to go to the ED for evaluation and possible hospital admission.

## 2015-04-23 NOTE — Telephone Encounter (Signed)
Noted. This has already been documented.

## 2015-04-23 NOTE — ED Notes (Signed)
MD at bedside. 

## 2015-04-23 NOTE — ED Notes (Signed)
Pt in c/o increased shortness of breath today, also reports tightness in her chest, on home O2 at 3L, speaking in full sentences, no distress noted

## 2015-04-24 ENCOUNTER — Encounter (HOSPITAL_COMMUNITY): Payer: Self-pay | Admitting: Internal Medicine

## 2015-04-24 ENCOUNTER — Inpatient Hospital Stay (HOSPITAL_COMMUNITY): Payer: 59

## 2015-04-24 DIAGNOSIS — I10 Essential (primary) hypertension: Secondary | ICD-10-CM

## 2015-04-24 DIAGNOSIS — Z7952 Long term (current) use of systemic steroids: Secondary | ICD-10-CM | POA: Diagnosis not present

## 2015-04-24 DIAGNOSIS — J810 Acute pulmonary edema: Secondary | ICD-10-CM | POA: Diagnosis not present

## 2015-04-24 DIAGNOSIS — R600 Localized edema: Secondary | ICD-10-CM | POA: Diagnosis not present

## 2015-04-24 DIAGNOSIS — Z79899 Other long term (current) drug therapy: Secondary | ICD-10-CM | POA: Diagnosis not present

## 2015-04-24 DIAGNOSIS — J45909 Unspecified asthma, uncomplicated: Secondary | ICD-10-CM | POA: Diagnosis present

## 2015-04-24 DIAGNOSIS — Z91040 Latex allergy status: Secondary | ICD-10-CM | POA: Diagnosis not present

## 2015-04-24 DIAGNOSIS — G4733 Obstructive sleep apnea (adult) (pediatric): Secondary | ICD-10-CM | POA: Diagnosis present

## 2015-04-24 DIAGNOSIS — R0602 Shortness of breath: Secondary | ICD-10-CM | POA: Diagnosis present

## 2015-04-24 DIAGNOSIS — J9621 Acute and chronic respiratory failure with hypoxia: Principal | ICD-10-CM | POA: Diagnosis present

## 2015-04-24 DIAGNOSIS — K219 Gastro-esophageal reflux disease without esophagitis: Secondary | ICD-10-CM | POA: Diagnosis present

## 2015-04-24 DIAGNOSIS — E119 Type 2 diabetes mellitus without complications: Secondary | ICD-10-CM | POA: Diagnosis not present

## 2015-04-24 DIAGNOSIS — Z888 Allergy status to other drugs, medicaments and biological substances status: Secondary | ICD-10-CM | POA: Diagnosis not present

## 2015-04-24 DIAGNOSIS — Z91010 Allergy to peanuts: Secondary | ICD-10-CM | POA: Diagnosis not present

## 2015-04-24 DIAGNOSIS — Z882 Allergy status to sulfonamides status: Secondary | ICD-10-CM | POA: Diagnosis not present

## 2015-04-24 DIAGNOSIS — Z9981 Dependence on supplemental oxygen: Secondary | ICD-10-CM | POA: Diagnosis not present

## 2015-04-24 DIAGNOSIS — Z981 Arthrodesis status: Secondary | ICD-10-CM | POA: Diagnosis not present

## 2015-04-24 DIAGNOSIS — Z825 Family history of asthma and other chronic lower respiratory diseases: Secondary | ICD-10-CM | POA: Diagnosis not present

## 2015-04-24 DIAGNOSIS — R609 Edema, unspecified: Secondary | ICD-10-CM

## 2015-04-24 DIAGNOSIS — E039 Hypothyroidism, unspecified: Secondary | ICD-10-CM | POA: Diagnosis present

## 2015-04-24 DIAGNOSIS — J9601 Acute respiratory failure with hypoxia: Secondary | ICD-10-CM

## 2015-04-24 DIAGNOSIS — J849 Interstitial pulmonary disease, unspecified: Secondary | ICD-10-CM

## 2015-04-24 DIAGNOSIS — M797 Fibromyalgia: Secondary | ICD-10-CM | POA: Diagnosis present

## 2015-04-24 DIAGNOSIS — R599 Enlarged lymph nodes, unspecified: Secondary | ICD-10-CM | POA: Diagnosis present

## 2015-04-24 DIAGNOSIS — Z794 Long term (current) use of insulin: Secondary | ICD-10-CM | POA: Diagnosis not present

## 2015-04-24 DIAGNOSIS — F329 Major depressive disorder, single episode, unspecified: Secondary | ICD-10-CM | POA: Diagnosis present

## 2015-04-24 DIAGNOSIS — K589 Irritable bowel syndrome without diarrhea: Secondary | ICD-10-CM | POA: Diagnosis present

## 2015-04-24 DIAGNOSIS — J84112 Idiopathic pulmonary fibrosis: Secondary | ICD-10-CM | POA: Diagnosis not present

## 2015-04-24 DIAGNOSIS — Z90710 Acquired absence of both cervix and uterus: Secondary | ICD-10-CM | POA: Diagnosis not present

## 2015-04-24 LAB — COMPREHENSIVE METABOLIC PANEL
ALBUMIN: 3.2 g/dL — AB (ref 3.5–5.0)
ALT: 27 U/L (ref 14–54)
AST: 22 U/L (ref 15–41)
Alkaline Phosphatase: 78 U/L (ref 38–126)
Anion gap: 11 (ref 5–15)
BUN: 23 mg/dL — AB (ref 6–20)
CALCIUM: 9 mg/dL (ref 8.9–10.3)
CO2: 26 mmol/L (ref 22–32)
CREATININE: 1.03 mg/dL — AB (ref 0.44–1.00)
Chloride: 101 mmol/L (ref 101–111)
GFR calc Af Amer: >60 mL/min (ref >60)
GFR calc non Af Amer: >60 mL/min (ref >60)
Glucose, Bld: 201 mg/dL — ABNORMAL HIGH (ref 65–99)
Potassium: 4.3 mmol/L (ref 3.5–5.1)
Sodium: 138 mmol/L (ref 135–145)
TOTAL PROTEIN: 6.8 g/dL (ref 6.5–8.1)
Total Bilirubin: 0.7 mg/dL (ref 0.3–1.2)

## 2015-04-24 LAB — CBC WITH DIFFERENTIAL/PLATELET
Basophils Absolute: 0 10*3/uL (ref 0.0–0.1)
Basophils Relative: 0 % (ref 0–1)
EOS ABS: 0.5 10*3/uL (ref 0.0–0.7)
EOS PCT: 5 % (ref 0–5)
HCT: 34.4 % — ABNORMAL LOW (ref 36.0–46.0)
HEMOGLOBIN: 11 g/dL — AB (ref 12.0–15.0)
Lymphocytes Relative: 12 % (ref 12–46)
Lymphs Abs: 1.3 10*3/uL (ref 0.7–4.0)
MCH: 27.6 pg (ref 26.0–34.0)
MCHC: 32 g/dL (ref 30.0–36.0)
MCV: 86.4 fL (ref 78.0–100.0)
MONO ABS: 0.2 10*3/uL (ref 0.1–1.0)
MONOS PCT: 2 % — AB (ref 3–12)
NEUTROS PCT: 81 % — AB (ref 43–77)
Neutro Abs: 8.8 10*3/uL — ABNORMAL HIGH (ref 1.7–7.7)
Platelets: 315 10*3/uL (ref 150–400)
RBC: 3.98 MIL/uL (ref 3.87–5.11)
RDW: 15.4 % (ref 11.5–15.5)
WBC: 10.8 10*3/uL — ABNORMAL HIGH (ref 4.0–10.5)

## 2015-04-24 LAB — GLUCOSE, CAPILLARY
GLUCOSE-CAPILLARY: 307 mg/dL — AB (ref 65–99)
GLUCOSE-CAPILLARY: 324 mg/dL — AB (ref 65–99)
Glucose-Capillary: 256 mg/dL — ABNORMAL HIGH (ref 65–99)
Glucose-Capillary: 262 mg/dL — ABNORMAL HIGH (ref 65–99)

## 2015-04-24 LAB — MRSA PCR SCREENING: MRSA by PCR: NEGATIVE

## 2015-04-24 MED ORDER — INSULIN ASPART 100 UNIT/ML ~~LOC~~ SOLN
0.0000 [IU] | Freq: Three times a day (TID) | SUBCUTANEOUS | Status: DC
  Administered 2015-04-24 (×2): 5 [IU] via SUBCUTANEOUS
  Administered 2015-04-24: 7 [IU] via SUBCUTANEOUS
  Administered 2015-04-25: 2 [IU] via SUBCUTANEOUS

## 2015-04-24 MED ORDER — SODIUM CHLORIDE 0.9 % IJ SOLN
3.0000 mL | Freq: Two times a day (BID) | INTRAMUSCULAR | Status: DC
  Administered 2015-04-24 (×3): 3 mL via INTRAVENOUS

## 2015-04-24 MED ORDER — BUDESONIDE 0.25 MG/2ML IN SUSP
0.5000 mg | Freq: Two times a day (BID) | RESPIRATORY_TRACT | Status: DC
  Administered 2015-04-24 – 2015-04-25 (×3): 0.5 mg via RESPIRATORY_TRACT
  Filled 2015-04-24 (×5): qty 4

## 2015-04-24 MED ORDER — VITAMIN E 180 MG (400 UNIT) PO CAPS
800.0000 [IU] | ORAL_CAPSULE | Freq: Every day | ORAL | Status: DC
  Administered 2015-04-24 – 2015-04-25 (×2): 800 [IU] via ORAL
  Filled 2015-04-24 (×2): qty 2

## 2015-04-24 MED ORDER — PREDNISONE 20 MG PO TABS
40.0000 mg | ORAL_TABLET | Freq: Every day | ORAL | Status: DC
  Administered 2015-04-24 – 2015-04-25 (×2): 40 mg via ORAL
  Filled 2015-04-24 (×2): qty 2

## 2015-04-24 MED ORDER — DULOXETINE HCL 60 MG PO CPEP
60.0000 mg | ORAL_CAPSULE | Freq: Every day | ORAL | Status: DC
  Administered 2015-04-24 – 2015-04-25 (×2): 60 mg via ORAL
  Filled 2015-04-24 (×2): qty 1

## 2015-04-24 MED ORDER — PREDNISONE 10 MG PO TABS: ORAL_TABLET | ORAL | Status: DC

## 2015-04-24 MED ORDER — INSULIN ASPART 100 UNIT/ML ~~LOC~~ SOLN
3.0000 [IU] | Freq: Once | SUBCUTANEOUS | Status: AC
  Administered 2015-04-24: 3 [IU] via SUBCUTANEOUS

## 2015-04-24 MED ORDER — PIRFENIDONE 267 MG PO CAPS: 534.0000 mg | ORAL_CAPSULE | Freq: Three times a day (TID) | ORAL | Status: DC

## 2015-04-24 MED ORDER — METHYLPREDNISOLONE SODIUM SUCC 125 MG IJ SOLR: 60.0000 mg | Freq: Two times a day (BID) | INTRAMUSCULAR | Status: DC

## 2015-04-24 MED ORDER — AMLODIPINE BESYLATE 5 MG PO TABS
5.0000 mg | ORAL_TABLET | Freq: Every morning | ORAL | Status: DC
Start: 2015-04-24 — End: 2015-04-25
  Administered 2015-04-24 – 2015-04-25 (×2): 5 mg via ORAL
  Filled 2015-04-24 (×2): qty 1

## 2015-04-24 MED ORDER — INSULIN GLARGINE 100 UNIT/ML ~~LOC~~ SOLN
55.0000 [IU] | Freq: Every day | SUBCUTANEOUS | Status: DC
  Administered 2015-04-24: 55 [IU] via SUBCUTANEOUS
  Filled 2015-04-24 (×2): qty 0.55

## 2015-04-24 MED ORDER — ARFORMOTEROL TARTRATE 15 MCG/2ML IN NEBU
15.0000 ug | INHALATION_SOLUTION | Freq: Two times a day (BID) | RESPIRATORY_TRACT | Status: DC
  Administered 2015-04-24 – 2015-04-25 (×3): 15 ug via RESPIRATORY_TRACT
  Filled 2015-04-24 (×6): qty 2

## 2015-04-24 MED ORDER — GUAIFENESIN ER 600 MG PO TB12: 600.0000 mg | ORAL_TABLET | Freq: Two times a day (BID) | ORAL | Status: DC | PRN

## 2015-04-24 MED ORDER — FUROSEMIDE 10 MG/ML IJ SOLN
40.0000 mg | Freq: Once | INTRAMUSCULAR | Status: AC
  Administered 2015-04-24: 40 mg via INTRAVENOUS
  Filled 2015-04-24: qty 4

## 2015-04-24 MED ORDER — ACETAMINOPHEN 650 MG RE SUPP: 650.0000 mg | Freq: Four times a day (QID) | RECTAL | Status: DC | PRN

## 2015-04-24 MED ORDER — ENOXAPARIN SODIUM 40 MG/0.4ML ~~LOC~~ SOLN
40.0000 mg | Freq: Every day | SUBCUTANEOUS | Status: DC
  Administered 2015-04-24 – 2015-04-25 (×2): 40 mg via SUBCUTANEOUS
  Filled 2015-04-24 (×2): qty 0.4

## 2015-04-24 MED ORDER — PIRFENIDONE 267 MG PO CAPS: 528.0000 mg | ORAL_CAPSULE | Freq: Three times a day (TID) | ORAL | Status: DC

## 2015-04-24 MED ORDER — PIRFENIDONE 267 MG PO CAPS
267.0000 mg | ORAL_CAPSULE | Freq: Three times a day (TID) | ORAL | Status: DC
  Administered 2015-04-24 – 2015-04-25 (×3): 267 mg via ORAL
  Filled 2015-04-24: qty 1

## 2015-04-24 MED ORDER — TRAZODONE HCL 50 MG PO TABS
75.0000 mg | ORAL_TABLET | Freq: Every day | ORAL | Status: DC
  Administered 2015-04-24: 75 mg via ORAL
  Filled 2015-04-24: qty 2

## 2015-04-24 MED ORDER — PIRFENIDONE 267 MG PO CAPS: 801.0000 mg | ORAL_CAPSULE | Freq: Three times a day (TID) | ORAL | Status: DC

## 2015-04-24 MED ORDER — PANTOPRAZOLE SODIUM 40 MG PO TBEC
40.0000 mg | DELAYED_RELEASE_TABLET | Freq: Every day | ORAL | Status: DC
  Administered 2015-04-24 – 2015-04-25 (×2): 40 mg via ORAL
  Filled 2015-04-24 (×2): qty 1

## 2015-04-24 MED ORDER — ACETAMINOPHEN 325 MG PO TABS: 650.0000 mg | ORAL_TABLET | Freq: Four times a day (QID) | ORAL | Status: DC | PRN

## 2015-04-24 MED ORDER — ALBUTEROL SULFATE (2.5 MG/3ML) 0.083% IN NEBU: 2.5000 mg | INHALATION_SOLUTION | RESPIRATORY_TRACT | Status: DC | PRN

## 2015-04-24 MED ORDER — FUROSEMIDE 40 MG PO TABS: 40.0000 mg | ORAL_TABLET | Freq: Every day | ORAL | Status: DC

## 2015-04-24 MED ORDER — VITAMIN D 1000 UNITS PO TABS
1000.0000 [IU] | ORAL_TABLET | Freq: Every day | ORAL | Status: DC
  Administered 2015-04-24 – 2015-04-25 (×2): 1000 [IU] via ORAL
  Filled 2015-04-24 (×2): qty 1

## 2015-04-24 MED ORDER — TOPIRAMATE 25 MG PO TABS
25.0000 mg | ORAL_TABLET | Freq: Two times a day (BID) | ORAL | Status: DC
  Administered 2015-04-24 – 2015-04-25 (×3): 25 mg via ORAL
  Filled 2015-04-24 (×4): qty 1

## 2015-04-24 MED ORDER — DICYCLOMINE HCL 20 MG PO TABS
20.0000 mg | ORAL_TABLET | Freq: Three times a day (TID) | ORAL | Status: DC
  Administered 2015-04-24 – 2015-04-25 (×6): 20 mg via ORAL
  Filled 2015-04-24 (×6): qty 1

## 2015-04-24 MED ORDER — PIRFENIDONE 267 MG PO CAPS: 267.0000 mg | ORAL_CAPSULE | Freq: Three times a day (TID) | ORAL | Status: DC

## 2015-04-24 MED ORDER — ONDANSETRON HCL 4 MG/2ML IJ SOLN: 4.0000 mg | Freq: Four times a day (QID) | INTRAMUSCULAR | Status: DC | PRN

## 2015-04-24 MED ORDER — OXYCODONE HCL 5 MG PO TABS: 5.0000 mg | ORAL_TABLET | ORAL | Status: DC | PRN

## 2015-04-24 MED ORDER — ONDANSETRON HCL 4 MG PO TABS: 4.0000 mg | ORAL_TABLET | Freq: Four times a day (QID) | ORAL | Status: DC | PRN

## 2015-04-24 MED ORDER — MECLIZINE HCL 25 MG PO TABS: 25.0000 mg | ORAL_TABLET | Freq: Three times a day (TID) | ORAL | Status: DC | PRN

## 2015-04-24 MED ORDER — LEVOTHYROXINE SODIUM 100 MCG PO TABS
200.0000 ug | ORAL_TABLET | Freq: Every day | ORAL | Status: DC
  Administered 2015-04-24 – 2015-04-25 (×2): 200 ug via ORAL
  Filled 2015-04-24 (×2): qty 2

## 2015-04-24 MED ORDER — ONDANSETRON HCL 4 MG PO TABS: 4.0000 mg | ORAL_TABLET | Freq: Three times a day (TID) | ORAL | Status: DC | PRN

## 2015-04-24 MED ORDER — TRIAMTERENE-HCTZ 37.5-25 MG PO TABS
1.0000 | ORAL_TABLET | Freq: Every evening | ORAL | Status: DC
  Administered 2015-04-24: 1 via ORAL
  Filled 2015-04-24 (×2): qty 1

## 2015-04-24 MED ORDER — METHYLPREDNISOLONE SODIUM SUCC 125 MG IJ SOLR
125.0000 mg | Freq: Once | INTRAMUSCULAR | Status: AC
  Administered 2015-04-24: 125 mg via INTRAVENOUS
  Filled 2015-04-24: qty 2

## 2015-04-24 MED ORDER — ALPRAZOLAM 0.5 MG PO TABS
0.5000 mg | ORAL_TABLET | Freq: Every day | ORAL | Status: DC | PRN
Start: 2015-04-24 — End: 2015-04-25

## 2015-04-24 NOTE — Care Management Note (Signed)
Case Management Note  Patient Details  Name: KHRYSTAL JEANMARIE MRN: [id number] Date of Birth: 25-Jun-1961  Subjective/Objective:   Pt admitted for acute on chronic respiratory failure. Pt is on home 02- 3L.                 Action/Plan: CM will continue to monitor for additional needs.    Expected Discharge Date:                  Expected Discharge Plan:  Home/Self Care  In-House Referral:     Discharge planning Services  CM Consult  Post Acute Care Choice:    Choice offered to:     DME Arranged:    DME Agency:     HH Arranged:    [place] Agency:     Status of Service:     Medicare Important Message Given:    Date Medicare IM Given:    Medicare IM give by:    Date Additional Medicare IM Given:    Additional Medicare Important Message give by:     If discussed at [place] of Stay Meetings, dates discussed:    Additional Comments:  Bethena Roys, RN 04/24/2015, 3:46 PM

## 2015-04-24 NOTE — H&P (Signed)
[facility] History and Physical  Joann [name] LJQ:[id number] DOB: [birth year] DOA: 04/23/2015  Referring physician: Mr.Danise. PCP: Velna Hatchet, MD  Specialists: Dr.Ramaswamy.  Chief Complaint: Shortness of breath.  HPI: Joann [name] is a 54 y.o. female with a recent diagnosis of UIP and was placed on Pirfenidone which patient was planning to start on June 16 presents to the ER because of worsening shortness of breath and lower extremity edema over the last 3 days. Denies any chest pain fever chills has chronic cough which is nonproductive. In the ER chest x-ray does not show anything acute. On exam patient has bilateral lower extremity edema. Patient has been admitted for acute respiratory failure probably from flareup of patient's UIP.   Review of Systems: As presented in the history of presenting illness, rest negative.  Past Medical History  Diagnosis Date  . Asthma   . Diabetes mellitus   . Headache(784.0)     MIGRAINES  . Fibromyalgia   . GERD (gastroesophageal reflux disease)   . IBS (irritable bowel syndrome)   . Renal mass, right   . IC (interstitial cystitis)   . Frequency of urination   . Anemia   . Swelling of both lower extremities     TAKES MAXIDE FOR SWELLING (DENIES HBP)  . Hypothyroidism   . Depression   . Cancer     kidney  . Hypertension   . Wears glasses   . Sleep apnea     uses a cpap  . Pulmonary fibrosis   . Pneumonia   . Fatty liver   . Oxygen dependent     2 Liters at rest, 3 liters with activity   Past Surgical History  Procedure Laterality Date  . Cesarean section  1983, 1985     x 2  . Bladder tack    . Cystocopy    . Cystoscopy    . Robotic assited partial nephrectomy Right 12/14/2013    Procedure: ROBOTIC ASSITED PARTIAL NEPHRECTOMY;  Surgeon: Dutch Gray, MD;  Location: [facility];  Service: Urology;  Laterality: Right;  . Cystoscopy w/ ureteral stent placement Right 12/14/2013    Procedure: CYSTOSCOPY WITH RETROGRADE  PYELOGRAM/URETERAL STENT PLACEMENT;  Surgeon: Dutch Gray, MD;  Location: [facility];  Service: Urology;  Laterality: Right;  . Cervical laminectomy  07/2012    anterior with plates and screws.  . Abdominal hysterectomy  1996  . Cholecystectomy  1995    tubal ligation also  . Posterior cervical laminectomy  2004  . Tubal ligation    . Colonoscopy    . Incisional hernia repair N/A 09/12/2014    Procedure: HERNIA REPAIR INCISIONAL;  Surgeon: Coralie Keens, MD;  Location: [place];  Service: General;  Laterality: N/A;  . Insertion of mesh N/A 09/12/2014    Procedure: INSERTION OF MESH;  Surgeon: Coralie Keens, MD;  Location: [place];  Service: General;  Laterality: N/A;  . Video assisted thoracoscopy Right 03/21/2015    Procedure: VIDEO ASSISTED THORACOSCOPY;  Surgeon: Gaye Pollack, MD;  Location: [facility];  Service: Thoracic;  Laterality: Right;  . Lung biopsy Right 03/21/2015    Procedure: Right Upper and Lower Lobe Lung Wedge BIOPSY;  Surgeon: Gaye Pollack, MD;  Location: [place] [facility];  Service: Thoracic;  Laterality: Right;   Social History:  reports that she has never smoked. She has never used smokeless tobacco. She reports that she drinks alcohol. She reports that she does not use illicit drugs. Where does patient  live home. Can patient participate in ADLs? Yes.  Allergies  Allergen Reactions  . Peanut-Containing Drug Products Shortness Of Breath    Pecans, walnuts as well  . Sulfa Antibiotics Hives and Shortness Of Breath  . Betadine [Povidone Iodine] Rash  . Latex Rash    Family History:  Family History  Problem Relation Age of Onset  . Cancer Mother     breast, lung  . COPD Father   . Hypertension Father   . Hyperlipidemia Father   . Stroke Father       Prior to Admission medications   Medication Sig Start Date End Date Taking? Authorizing Provider  albuterol (PROVENTIL HFA;VENTOLIN HFA) 108 (90 BASE) MCG/ACT inhaler Inhale 2 puffs into the  lungs every 6 (six) hours as needed. For shortness of breath   Yes Historical Provider, MD  ALPRAZolam Duanne Moron) 0.5 MG tablet Take 0.5 mg by mouth daily as needed for anxiety.    Yes Historical Provider, MD  amLODipine (NORVASC) 5 MG tablet Take 5 mg by mouth every morning.    Yes Historical Provider, MD  cholecalciferol (VITAMIN D) 1000 UNITS tablet Take 1,000 Units by mouth daily.   Yes Historical Provider, MD  dicyclomine (BENTYL) 20 MG tablet Take 20 mg by mouth every 6 (six) hours.   Yes Historical Provider, MD  DULoxetine (CYMBALTA) 60 MG capsule Take 60 mg by mouth daily.   Yes Historical Provider, MD  esomeprazole (NEXIUM) 40 MG capsule Take 40 mg by mouth 2 (two) times daily before a meal.    Yes Historical Provider, MD  estrogens, conjugated, (PREMARIN) 0.625 MG tablet Take 0.625 mg by mouth daily.    Yes Historical Provider, MD  Fluticasone-Salmeterol (ADVAIR) 100-50 MCG/DOSE AEPB Inhale 1 puff into the lungs as needed (shortness of breath).    Yes Historical Provider, MD  guaiFENesin (MUCINEX) 600 MG 12 hr tablet Take 1 tablet (600 mg total) by mouth 2 (two) times daily. Patient taking differently: Take 600 mg by mouth 2 (two) times daily as needed for cough or to loosen phlegm.  03/25/15  Yes Erin R Barrett, PA-C  insulin aspart (NOVOLOG) 100 UNIT/ML injection Inject 0-15 Units into the skin 3 (three) times daily with meals. Sliding scale with meals Patient taking differently: Inject 15 Units into the skin 3 (three) times daily with meals. Sliding scale with meals 04/02/15  Yes Domenic Polite, MD  insulin glargine (LANTUS) 100 UNIT/ML injection Inject 0.55 mLs (55 Units total) into the skin at bedtime. 04/02/15  Yes Domenic Polite, MD  levothyroxine (SYNTHROID, LEVOTHROID) 200 MCG tablet Take 200 mcg by mouth daily before breakfast.   Yes Historical Provider, MD  meclizine (ANTIVERT) 25 MG tablet Take 25 mg by mouth 3 (three) times daily as needed for dizziness.   Yes Historical Provider, MD   metFORMIN (GLUCOPHAGE-XR) 500 MG 24 hr tablet Take 1,000 mg by mouth 2 (two) times daily.   Yes Historical Provider, MD  ondansetron (ZOFRAN) 4 MG tablet Take 4 mg by mouth every 8 (eight) hours as needed for nausea or vomiting.   Yes Historical Provider, MD  oxyCODONE (OXY IR/ROXICODONE) 5 MG immediate release tablet Take 1-2 tablets (5-10 mg total) by mouth every 4 (four) hours as needed for severe pain. 04/19/15  Yes Gaye Pollack, MD  Pirfenidone (ESBRIET) 267 MG CAPS Days 1-7: 1 cap three times daily.  Days 8-14: 2 caps three times daily, Days 15 onward: 3 caps three times daily. 04/09/15  Yes Melvenia Needles, NP  predniSONE (DELTASONE) 10 MG tablet 30mg  for 1 week then 20mg  daily and hold Patient taking differently: Take 20 mg by mouth daily with breakfast.  04/09/15  Yes Tammy S Parrett, NP  topiramate (TOPAMAX) 25 MG tablet Take 25 mg by mouth 2 (two) times daily.   Yes Historical Provider, MD  traZODone (DESYREL) 150 MG tablet Take 75 mg by mouth at bedtime.   Yes Historical Provider, MD  triamterene-hydrochlorothiazide (MAXZIDE-25) 37.5-25 MG per tablet Take 1 tablet by mouth every evening.   Yes Historical Provider, MD  vitamin E 400 UNIT capsule Take 800 Units by mouth daily.   Yes Historical Provider, MD    Physical Exam: Filed Vitals:   04/23/15 2147 04/23/15 2345 04/24/15 0036 04/24/15 0115  BP: 134/81 137/83 153/87 132/74  Pulse: 89 82 80 85  Temp: 98 F (36.7 C)     Resp: 20 24 24  36  Weight: 83.008 kg (183 lb)     SpO2: 100% 100% 94% 97%     General:  Moderately built and nourished.  Eyes: Anicteric no pallor.  ENT: No discharge from the ears eyes nose and mouth.  Neck: No mass felt. No JVD appreciated.  Cardiovascular: S1-S2 heard.  Respiratory: No rhonchi or crepitations.  Abdomen: Soft nontender bowel sounds present.  Skin: No rash.  Musculoskeletal: No edema.  Psychiatric: Appears normal.  Neurologic: Alert awake oriented to time place and person.  Moves all extremities.  Labs on Admission:  Basic Metabolic Panel:  Recent Labs Lab 04/23/15 2153  NA 138  K 4.2  CL 103  CO2 25  GLUCOSE 141*  BUN 20  CREATININE 1.04*  CALCIUM 9.3   Liver Function Tests:  Recent Labs Lab 04/23/15 1406  AST 14  ALT 22  ALKPHOS 74  BILITOT 0.3  PROT 6.5  ALBUMIN 3.6   No results for input(s): LIPASE, AMYLASE in the last 168 hours. No results for input(s): AMMONIA in the last 168 hours. CBC:  Recent Labs Lab 04/23/15 2153  WBC 10.0  HGB 10.1*  HCT 31.5*  MCV 86.1  PLT 302   Cardiac Enzymes: No results for input(s): CKTOTAL, CKMB, CKMBINDEX, TROPONINI in the last 168 hours.  BNP (last 3 results)  Recent Labs  02/28/15 1152 03/28/15 2241 04/23/15 2153  BNP 66.7 17.3 77.3    ProBNP (last 3 results) No results for input(s): PROBNP in the last 8760 hours.  CBG: No results for input(s): GLUCAP in the last 168 hours.  Radiological Exams on Admission: Dg Chest 2 View  04/23/2015   CLINICAL DATA:  Chest tightness for 2 days. Hypertension, diabetes, asthma. Shortness of breath.  EXAM: CHEST  2 VIEW  COMPARISON:  04/19/2015  FINDINGS: Diffuse bilateral airspace parenchymal disease in the lungs with low lung volumes consistent with chronic fibrosis. No definite superimposed consolidation. Normal heart size and pulmonary vascularity. No blunting of costophrenic angles. No pneumothorax. Postoperative changes in the cervical spine. Small bilateral cervical ribs.  IMPRESSION: Chronic pulmonary fibrosis. No significant change since prior study.   Electronically Signed   By: Lucienne Capers M.D.   On: 04/23/2015 22:27    EKG: Independently reviewed. Normal sinus rhythm.  Assessment/Plan Active Problems:   HTN (hypertension)   Acute on chronic respiratory failure   ILD (interstitial lung disease)   Acute respiratory failure with hypoxia   Diabetes mellitus type 2, controlled   1. Acute hypoxic respiratory failure probably  secondary to flareup of patient's known UIP - patient does have lower extremity  edema for which I have ordered Lasix 40 mg IV 1 dose and I have also consulted pulmonary critical care for further recommendations. Patient is on steroids which will be continued for now. Patient did receive 1 dose of IV Solu-Medrol in the ER. Closely follow intake output. 2. Diabetes mellitus type 2 - hold metformin while inpatient and continue Lantus with sliding scale coverage and closely follow CBGs if patient's prednisone doses are increased. 3. Hypertension - continue present medications. 4. Hypothyroidism - continue Synthroid.   DVT Prophylaxis Lovenox.  Code Status: Full code.  Family Communication: Discussed with patient.  Disposition Plan: Admit to inpatient.    Amillia Biffle N. [facility] Pager [phone].  If 7PM-7AM, please contact night-coverage [email or web address] Password [email or web address] 04/24/2015, 1:33 AM

## 2015-04-24 NOTE — Progress Notes (Signed)
VASCULAR LAB PRELIMINARY  PRELIMINARY  PRELIMINARY  PRELIMINARY  Bilateral lower extremity venous duplex  completed.    Preliminary report:  Bilateral:  No evidence of DVT, superficial thrombosis, or Baker's Cyst.    Joann [name], RVT 04/24/2015, 3:17 PM

## 2015-04-24 NOTE — Progress Notes (Signed)
[facility] PROGRESS NOTE  Joann [name] OEU:[id number] DOB: 1961/01/04 DOA: 04/23/2015 PCP: Velna Hatchet, MD   Brief narrative 54 y.o. female with a recent diagnosis of UIP and was placed on Pirfenidone which patient was planning to start on June 16 presents to the ER because of worsening shortness of breath and lower extremity edema over the last 3 days.  Assessment/Plan: Acute on chronic hypoxic respiratory failure Secondary to flare up of underlying UIP. Continue current dose fo prednisone with plan on taper to 10 mg daily.  Added pulmocort and brovana nebs. pt maintaining o2 sat in mid 90s on 2-3 L via [place] ( on 2L at rest and 3L on ambulation at home), but desats to 86% on ambulation at 4L.  Will need further dose of lasix and nebs Appreciate Pulm eval. Resume pirfenidone. Follow-up with Dr. Chase Caller next week.  Type 2 DM Continue lantus and SSI. Holding metformin.  Essesntial HTN  Stable. Continue home medications  Leg edema Recent 2-D echo with grade 1 diastolic dysfunction. Received one dose IV Lasix this morning. Will order another dose and monitor. Repeat chest x-ray and exam. May need oral Lasix upon discharge.  Diet: Diabetic  Code Status: Full code Family Communication: Friend at bedside Disposition Plan: Home possibly tomorrow if dyspnea improved.   Consultants:  Pulmonary  Procedures:  None  Antibiotics:  None  HPI/Subjective: Patient seen and examined.  Objective: Filed Vitals:   04/24/15 1121  BP: 125/76  Pulse: 89  Temp: 98.2 F (36.8 C)  Resp:     Intake/Output Summary (Last 24 hours) at 04/24/15 1301 Last data filed at 04/24/15 0905  Gross per 24 hour  Intake    460 ml  Output   1450 ml  Net   -990 ml   Filed Weights   04/23/15 2147 04/24/15 0338  Weight: 83.008 kg (183 lb) 85.367 kg (188 lb 3.2 oz)    Exam:   General:  Middle aged female in no acute distress  HEENT: No pallor, moist oral mucosa, supple neck, no  JVD  Chest: Bibasilar coarse crackles, no rhonchi or wheeze  CVS: Normal S1 and S2, no murmurs rub or gallop  GI: Soft, nondistended, nontender, bowel sounds present  Musculoskeletal: Warm, 1+ pitting edema bilaterally  CNS: Alert and oriented  Data Reviewed: Basic Metabolic Panel:  Recent Labs Lab 04/23/15 2153 04/24/15 0511  NA 138 138  K 4.2 4.3  CL 103 101  CO2 25 26  GLUCOSE 141* 201*  BUN 20 23*  CREATININE 1.04* 1.03*  CALCIUM 9.3 9.0   Liver Function Tests:  Recent Labs Lab 04/23/15 1406 04/24/15 0511  AST 14 22  ALT 22 27  ALKPHOS 74 78  BILITOT 0.3 0.7  PROT 6.5 6.8  ALBUMIN 3.6 3.2*   No results for input(s): LIPASE, AMYLASE in the last 168 hours. No results for input(s): AMMONIA in the last 168 hours. CBC:  Recent Labs Lab 04/23/15 2153 04/24/15 0511  WBC 10.0 10.8*  NEUTROABS  --  8.8*  HGB 10.1* 11.0*  HCT 31.5* 34.4*  MCV 86.1 86.4  PLT 302 315   Cardiac Enzymes: No results for input(s): CKTOTAL, CKMB, CKMBINDEX, TROPONINI in the last 168 hours. BNP (last 3 results)  Recent Labs  02/28/15 1152 03/28/15 2241 04/23/15 2153  BNP 66.7 17.3 77.3    ProBNP (last 3 results) No results for input(s): PROBNP in the last 8760 hours.  CBG:  Recent Labs Lab 04/24/15 0739 04/24/15 1118  GLUCAP  256* 307*    Recent Results (from the past 240 hour(s))  MRSA PCR Screening     Status: None   Collection Time: 04/24/15  3:56 AM  Result Value Ref Range Status   MRSA by PCR NEGATIVE NEGATIVE Final    Comment:        The GeneXpert MRSA Assay (FDA approved for NASAL specimens only), is one component of a comprehensive MRSA colonization surveillance program. It is not intended to diagnose MRSA infection nor to guide or monitor treatment for MRSA infections.      Studies: Dg Chest 2 View  04/23/2015   CLINICAL DATA:  Chest tightness for 2 days. Hypertension, diabetes, asthma. Shortness of breath.  EXAM: CHEST  2 VIEW  COMPARISON:   04/19/2015  FINDINGS: Diffuse bilateral airspace parenchymal disease in the lungs with low lung volumes consistent with chronic fibrosis. No definite superimposed consolidation. Normal heart size and pulmonary vascularity. No blunting of costophrenic angles. No pneumothorax. Postoperative changes in the cervical spine. Small bilateral cervical ribs.  IMPRESSION: Chronic pulmonary fibrosis. No significant change since prior study.   Electronically Signed   By: Lucienne Capers M.D.   On: 04/23/2015 22:27    Scheduled Meds: . amLODipine  5 mg Oral q morning - 10a  . arformoterol  15 mcg Nebulization BID  . budesonide  0.5 mg Nebulization BID  . cholecalciferol  1,000 Units Oral Daily  . dicyclomine  20 mg Oral TID AC & HS  . DULoxetine  60 mg Oral Daily  . enoxaparin (LOVENOX) injection  40 mg Subcutaneous Daily  . insulin aspart  0-9 Units Subcutaneous TID WC  . insulin glargine  55 Units Subcutaneous QHS  . levothyroxine  200 mcg Oral QAC breakfast  . pantoprazole  40 mg Oral Daily  . Pirfenidone  267 mg Oral TID WC   Followed by  . [START ON 05/01/2015] Pirfenidone  528 mg Oral TID WC   Followed by  . [START ON 05/08/2015] Pirfenidone  801 mg Oral TID WC  . predniSONE  40 mg Oral Q breakfast  . sodium chloride  3 mL Intravenous Q12H  . topiramate  25 mg Oral BID  . traZODone  75 mg Oral QHS  . triamterene-hydrochlorothiazide  1 tablet Oral QPM  . vitamin E  800 Units Oral Daily   Continuous Infusions:    Time spent: 25 minutes    Aseel Truxillo, [place]  [facility] Pager [phone]. If 7PM-7AM, please contact night-coverage at [email or web address], password [facility] 04/24/2015, 1:01 PM  LOS: 0 days

## 2015-04-24 NOTE — Consult Note (Signed)
Name: Joann [name] MRN: [id number] DOB: 22-Sep-1961    ADMISSION DATE:  04/23/2015 CONSULTATION DATE:  04/24/2015  REFERRING MD :  Hal Hope  CHIEF COMPLAINT:  SOB  BRIEF PATIENT DESCRIPTION: 54 y.o. 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  Admitted by [facility] for presumed IPF flare.  PCCM consulted for recs.  SIGNIFICANT EVENTS  6/15 - admitted with SOB due to presumed IPF flare.  STUDIES:  CXR 6/15 >>> chronic IPF changes.   HISTORY OF PRESENT ILLNESS:  Joann [name] is a 54 y.o. 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 Cyndia Bent).  She has had multiple recent admissions, most recently 03/28/15 - 04/02/15 when she was admitted with HCAP and IPF flare.  She presented to [facility] ED early AM hours 6/15 with SOB and LE edema.  Her symptoms began 1 day prior and had worsened since onset. She has had associated chest tightness but no true chest pain.  She denies any fevers/chills/sweats, N/V/D, abd pain, myalgias.  She has a chronic cough at baseline which has remained unchanged.  She is chronically on 2 - 3 L O2 and states that since one day prior, she has increased her O2 to 4L.  She has been on 30mg  prednisone and just yesterday decreased dose to 20mg  (the plan was for her to drop down to maintenance of 10mg  daily). She denies any hx of VTE, recent travel or periods of prolonged immobilization, surgery, hemoptysis, personal or family hx of clotting disorder.  She has hx of right renal CA s/p surgical excision.  CXR revealed chronic IPF changes without any changes from prior study.  She was admitted by [facility] and PCCM was consulted for recs.  PAST MEDICAL HISTORY :   has a past medical history of Asthma; Diabetes mellitus; Headache(784.0); Fibromyalgia; GERD (gastroesophageal reflux disease); IBS (irritable bowel syndrome); Renal mass,  right; IC (interstitial cystitis); Frequency of urination; Anemia; Swelling of both lower extremities; Hypothyroidism; Depression; Cancer; Hypertension; Wears glasses; Sleep apnea; Pulmonary fibrosis; Pneumonia; Fatty liver; and Oxygen dependent.  has past surgical history that includes Cesarean section (1983, 1985); Platea; CYSTOCOPY; Cystoscopy; Robotic assited partial nephrectomy (Right, 12/14/2013); Cystoscopy w/ ureteral stent placement (Right, 12/14/2013); Cervical laminectomy (07/2012); Abdominal hysterectomy (1996); Cholecystectomy (1995); Posterior cervical laminectomy (2004); Tubal ligation; Colonoscopy; Incisional hernia repair (N/A, 09/12/2014); Insertion of mesh (N/A, 09/12/2014); Video assisted thoracoscopy (Right, 03/21/2015); and Lung biopsy (Right, 03/21/2015). Prior to Admission medications   Medication Sig Start Date End Date Taking? Authorizing Provider  albuterol (PROVENTIL HFA;VENTOLIN HFA) 108 (90 BASE) MCG/ACT inhaler Inhale 2 puffs into the lungs every 6 (six) hours as needed. For shortness of breath   Yes Historical Provider, MD  ALPRAZolam Duanne Moron) 0.5 MG tablet Take 0.5 mg by mouth daily as needed for anxiety.    Yes Historical Provider, MD  amLODipine (NORVASC) 5 MG tablet Take 5 mg by mouth every morning.    Yes Historical Provider, MD  cholecalciferol (VITAMIN D) 1000 UNITS tablet Take 1,000 Units by mouth daily.   Yes Historical Provider, MD  dicyclomine (BENTYL) 20 MG tablet Take 20 mg by mouth every 6 (six) hours.   Yes Historical Provider, MD  DULoxetine (CYMBALTA) 60 MG capsule Take 60 mg by mouth daily.   Yes Historical Provider, MD  esomeprazole (NEXIUM) 40 MG capsule Take 40 mg by mouth 2 (two) times  daily before a meal.    Yes Historical Provider, MD  estrogens, conjugated, (PREMARIN) 0.625 MG tablet Take 0.625 mg by mouth daily.    Yes Historical Provider, MD  Fluticasone-Salmeterol (ADVAIR) 100-50 MCG/DOSE AEPB Inhale 1 puff into the lungs as needed (shortness of  breath).    Yes Historical Provider, MD  guaiFENesin (MUCINEX) 600 MG 12 hr tablet Take 1 tablet (600 mg total) by mouth 2 (two) times daily. Patient taking differently: Take 600 mg by mouth 2 (two) times daily as needed for cough or to loosen phlegm.  03/25/15  Yes Erin R Barrett, PA-C  insulin aspart (NOVOLOG) 100 UNIT/ML injection Inject 0-15 Units into the skin 3 (three) times daily with meals. Sliding scale with meals Patient taking differently: Inject 15 Units into the skin 3 (three) times daily with meals. Sliding scale with meals 04/02/15  Yes Domenic Polite, MD  insulin glargine (LANTUS) 100 UNIT/ML injection Inject 0.55 mLs (55 Units total) into the skin at bedtime. 04/02/15  Yes Domenic Polite, MD  levothyroxine (SYNTHROID, LEVOTHROID) 200 MCG tablet Take 200 mcg by mouth daily before breakfast.   Yes Historical Provider, MD  meclizine (ANTIVERT) 25 MG tablet Take 25 mg by mouth 3 (three) times daily as needed for dizziness.   Yes Historical Provider, MD  metFORMIN (GLUCOPHAGE-XR) 500 MG 24 hr tablet Take 1,000 mg by mouth 2 (two) times daily.   Yes Historical Provider, MD  ondansetron (ZOFRAN) 4 MG tablet Take 4 mg by mouth every 8 (eight) hours as needed for nausea or vomiting.   Yes Historical Provider, MD  oxyCODONE (OXY IR/ROXICODONE) 5 MG immediate release tablet Take 1-2 tablets (5-10 mg total) by mouth every 4 (four) hours as needed for severe pain. 04/19/15  Yes Gaye Pollack, MD  Pirfenidone (ESBRIET) 267 MG CAPS Days 1-7: 1 cap three times daily.  Days 8-14: 2 caps three times daily, Days 15 onward: 3 caps three times daily. 04/09/15  Yes Tammy S Parrett, NP  predniSONE (DELTASONE) 10 MG tablet 30mg  for 1 week then 20mg  daily and hold Patient taking differently: Take 20 mg by mouth daily with breakfast.  04/09/15  Yes Tammy S Parrett, NP  topiramate (TOPAMAX) 25 MG tablet Take 25 mg by mouth 2 (two) times daily.   Yes Historical Provider, MD  traZODone (DESYREL) 150 MG tablet Take 75  mg by mouth at bedtime.   Yes Historical Provider, MD  triamterene-hydrochlorothiazide (MAXZIDE-25) 37.5-25 MG per tablet Take 1 tablet by mouth every evening.   Yes Historical Provider, MD  vitamin E 400 UNIT capsule Take 800 Units by mouth daily.   Yes Historical Provider, MD   Allergies  Allergen Reactions  . Peanut-Containing Drug Products Shortness Of Breath    Pecans, walnuts as well  . Sulfa Antibiotics Hives and Shortness Of Breath  . Betadine [Povidone Iodine] Rash  . Latex Rash    FAMILY HISTORY:  family history includes COPD in her father; Cancer in her mother; Hyperlipidemia in her father; Hypertension in her father; Stroke in her father. SOCIAL HISTORY:  reports that she has never smoked. She has never used smokeless tobacco. She reports that she drinks alcohol. She reports that she does not use illicit drugs.  REVIEW OF SYSTEMS:   All negative; except for those that are bolded, which indicate positives.  Constitutional: weight loss, weight gain, night sweats, fevers, chills, fatigue, weakness.  HEENT: headaches, sore throat, sneezing, nasal congestion, post nasal drip, difficulty swallowing, tooth/dental problems, visual complaints, visual  changes, ear aches. Neuro: difficulty with speech, weakness, numbness, ataxia. CV:  chest pain, orthopnea, PND, swelling in lower extremities, dizziness, palpitations, syncope, chest tightness. Resp: cough, hemoptysis, dyspnea, wheezing. GI  heartburn, indigestion, abdominal pain, nausea, vomiting, diarrhea, constipation, change in bowel habits, loss of appetite, hematemesis, melena, hematochezia.  GU: dysuria, change in color of urine, urgency or frequency, flank pain, hematuria. MSK: joint pain or swelling, decreased range of motion. Psych: change in mood or affect, depression, anxiety, suicidal ideations, homicidal ideations. Skin: rash, itching, bruising.   SUBJECTIVE: Denies chest pain.  SOB mainly with exertion.  Ambulated  earlier and symptoms worsened compared to baseline.  VITAL SIGNS: Temp:  [98 F (36.7 C)] 98 F (36.7 C) (06/14 2147) Pulse Rate:  [77-89] 77 (06/15 0130) Resp:  [20-36] 31 (06/15 0130) BP: (132-153)/(74-87) 137/83 mmHg (06/15 0130) SpO2:  [94 %-100 %] 100 % (06/15 0130) Weight:  [83.008 kg (183 lb)] 83.008 kg (183 lb) (06/14 2147)  PHYSICAL EXAMINATION: General: Adult female, resting in bed, in NAD. Neuro: A&O x 3, non-focal.  HEENT: [place]/AT. PERRL, sclerae anicteric. Cardiovascular: RRR, no M/R/G.  Lungs: Respirations even and unlabored.  Faint bi-basilar crackles. Abdomen: BS x 4, soft, NT/ND.  Musculoskeletal: No gross deformities, 1+ non-pitting edema bilaterally. Skin: Intact, warm, no rashes.     Recent Labs Lab 04/23/15 2153  NA 138  K 4.2  CL 103  CO2 25  BUN 20  CREATININE 1.04*  GLUCOSE 141*    Recent Labs Lab 04/23/15 2153  HGB 10.1*  HCT 31.5*  WBC 10.0  PLT 302   Dg Chest 2 View  04/23/2015   CLINICAL DATA:  Chest tightness for 2 days. Hypertension, diabetes, asthma. Shortness of breath.  EXAM: CHEST  2 VIEW  COMPARISON:  04/19/2015  FINDINGS: Diffuse bilateral airspace parenchymal disease in the lungs with low lung volumes consistent with chronic fibrosis. No definite superimposed consolidation. Normal heart size and pulmonary vascularity. No blunting of costophrenic angles. No pneumothorax. Postoperative changes in the cervical spine. Small bilateral cervical ribs.  IMPRESSION: Chronic pulmonary fibrosis. No significant change since prior study.   Electronically Signed   By: Lucienne Capers M.D.   On: 04/23/2015 22:27    ASSESSMENT / PLAN:  Acute on chronic hypoxic respiratory failure. Possible IPF flare - recently confirmed UIP with lymphoid hyperplasia via VATS.  Possibility of RA - ILD / UIP; awaiting biopsy of R elbow nodule by derm. Doubt PE. Plan: Continue supplemental O2 as needed to maintain SpO2 > 92%. Diuresis as able. Mobilize as  able. Prednisone 40mg  daily - can decrease to 30mg  over next day or two then start taper back down to maintenance of 10mg  daily. Budesonide / Arformoterol in lieu of outpatient Advair as nebs will provide enhanced delivery while pt is acutely ill. Albuterol PRN. Weight loss encouraged. Pulmonary rehab once discharged. Start pirfenidone once discharged (pt informs me she was planning to start 04/25/15) Follow up scheduled with Dr. Chase Caller 04/30/15. CXR intermittently.  Rest per primary.   Montey Hora, [facility] Pager: [phone]  or [phone] 04/24/2015, 1:53 AM    Attending:  I have seen and examined the patient with nurse practitioner/resident and agree with the note above.   Chart reviewed, this is my partner's patient who recently had an open lung biopsy read by [facility] pathologists as UIP with lymphoid hyperplasia.  She has been started on pirfenidone in addition to  prednisone.  Her symptoms started about three days  Prior to admission and were associated with leg swelling followed by worsening dyspnea.  She denies cough productive of mucus, no fever, no chills, no chest pain.    On exam: crackles bilaterally, slightly tachypneic but no respiratory muscle use, edema in legs CXR images reviewed> perhaps a slight worsening in bilateral disease with more airspace disease superimposed on chronic interstitial changes  Acute on chronic respiratory failure with hypoxemia> I think that this is most likely pulmonary edema as she does not appear infected right now (no fever, chills, etc).  IPF flare is possible, and this form of UIP (described as "BLU-IP" by some given all the lymphocytes seen on H&E) is believed to be an aggressive form of pulmonary fibrosis that may benefit from both antifibrotic therapy and immunosuppression.    Rec: Lasix again, IV, now given ongoing edema and tachypnea Repeat CXR in AM Continue  prednisone at current dose, I don't see a role for pulse steroids right now Continue pirfenidone per home regimen Hold off on antibiotics  Will follow  Roselie Awkward, MD [place] [facility] Pager: [phone] Cell: [phone] After 3pm or if no response, call [phone]

## 2015-04-24 NOTE — ED Provider Notes (Signed)
CSN: [id number]     Arrival date & time 04/23/15  2142 History   First MD Initiated Contact with Patient 04/23/15 2330     Chief Complaint  Patient presents with  . Shortness of Breath   Joann [name] is a 54 y.o. female with a history of pulmonary fibrosis, asthma, diabetes, and is oxygen dependent who presents to the emergency department complaining of worsening shortness of breath since yesterday as well as bilateral lower extremity edema. The patient reports she has been on prednisone 30 mg for the past several weeks and recently decreased her dose to 20 mg yesterday. She reports since yesterday she's had worsening shortness of breath and dyspnea on exertion. Patient is on home O2 at 3 L via nasal cannula. She reports a chronic dry cough that has not worsened. Patient has chronic bilateral lower extremity edema that is slightly worsened since yesterday. She reports having chest tightness since yesterday, but denies chest pain.  Patient reports she spoke with Dr. Oletta Darter from pulmonary medicine tonight who advised her to come to the ED for evaluation and possible admission. The patient denies fevers, chills, chest pain, palpitations, abdominal pain, nausea, vomiting, headache, lightheadedness, dizziness, numbness, tingling, weakness, or rashes. The patient denies personal history of DVTs or PEs. She denies personal or family history of blood clotting disorders such as factor V leiden, protein C or S deficiency. Patient denies recent travel, recent surgery, hemoptysis, estrogen use or leg pain.     (Consider location/radiation/quality/duration/timing/severity/associated sxs/prior Treatment) HPI  Past Medical History  Diagnosis Date  . Asthma   . Diabetes mellitus   . Headache(784.0)     MIGRAINES  . Fibromyalgia   . GERD (gastroesophageal reflux disease)   . IBS (irritable bowel syndrome)   . Renal mass, right   . IC (interstitial cystitis)   . Frequency of urination   . Anemia   .  Swelling of both lower extremities     TAKES MAXIDE FOR SWELLING (DENIES HBP)  . Hypothyroidism   . Depression   . Cancer     kidney  . Hypertension   . Wears glasses   . Sleep apnea     uses a cpap  . Pulmonary fibrosis   . Pneumonia   . Fatty liver   . Oxygen dependent     2 Liters at rest, 3 liters with activity   Past Surgical History  Procedure Laterality Date  . Cesarean section  1983, 1985     x 2  . Bladder tack    . Cystocopy    . Cystoscopy    . Robotic assited partial nephrectomy Right 12/14/2013    Procedure: ROBOTIC ASSITED PARTIAL NEPHRECTOMY;  Surgeon: Dutch Gray, MD;  Location: [facility];  Service: Urology;  Laterality: Right;  . Cystoscopy w/ ureteral stent placement Right 12/14/2013    Procedure: CYSTOSCOPY WITH RETROGRADE PYELOGRAM/URETERAL STENT PLACEMENT;  Surgeon: Dutch Gray, MD;  Location: [facility];  Service: Urology;  Laterality: Right;  . Cervical laminectomy  07/2012    anterior with plates and screws.  . Abdominal hysterectomy  1996  . Cholecystectomy  1995    tubal ligation also  . Posterior cervical laminectomy  2004  . Tubal ligation    . Colonoscopy    . Incisional hernia repair N/A 09/12/2014    Procedure: HERNIA REPAIR INCISIONAL;  Surgeon: Coralie Keens, MD;  Location: [place];  Service: General;  Laterality: N/A;  . Insertion of mesh N/A 09/12/2014  Procedure: INSERTION OF MESH;  Surgeon: Coralie Keens, MD;  Location: [place];  Service: General;  Laterality: N/A;  . Video assisted thoracoscopy Right 03/21/2015    Procedure: VIDEO ASSISTED THORACOSCOPY;  Surgeon: Gaye Pollack, MD;  Location: [facility];  Service: Thoracic;  Laterality: Right;  . Lung biopsy Right 03/21/2015    Procedure: Right Upper and Lower Lobe Lung Wedge BIOPSY;  Surgeon: Gaye Pollack, MD;  Location: [facility];  Service: Thoracic;  Laterality: Right;   Family History  Problem Relation Age of Onset  . Cancer Mother     breast, lung  . COPD  Father   . Hypertension Father   . Hyperlipidemia Father   . Stroke Father    History  Substance Use Topics  . Smoking status: Never Smoker   . Smokeless tobacco: Never Used  . Alcohol Use: Yes     Comment: occasional   OB History    No data available     Review of Systems  Constitutional: Negative for fever and chills.  HENT: Negative for congestion, ear pain and sore throat.   Eyes: Negative for pain and visual disturbance.  Respiratory: Positive for cough, chest tightness and shortness of breath. Negative for wheezing.   Cardiovascular: Positive for leg swelling. Negative for chest pain and palpitations.  Gastrointestinal: Negative for nausea, vomiting, abdominal pain and diarrhea.  Genitourinary: Negative for dysuria.  Musculoskeletal: Negative for back pain and neck pain.  Skin: Negative for rash.  Neurological: Negative for dizziness, weakness, light-headedness, numbness and headaches.      Allergies  Peanut-containing drug products; Sulfa antibiotics; Betadine; and Latex  Home Medications   Prior to Admission medications   Medication Sig Start Date End Date Taking? Authorizing Provider  albuterol (PROVENTIL HFA;VENTOLIN HFA) 108 (90 BASE) MCG/ACT inhaler Inhale 2 puffs into the lungs every 6 (six) hours as needed. For shortness of breath   Yes Historical Provider, MD  ALPRAZolam Duanne Moron) 0.5 MG tablet Take 0.5 mg by mouth daily as needed for anxiety.    Yes Historical Provider, MD  amLODipine (NORVASC) 5 MG tablet Take 5 mg by mouth every morning.    Yes Historical Provider, MD  cholecalciferol (VITAMIN D) 1000 UNITS tablet Take 1,000 Units by mouth daily.   Yes Historical Provider, MD  dicyclomine (BENTYL) 20 MG tablet Take 20 mg by mouth every 6 (six) hours.   Yes Historical Provider, MD  DULoxetine (CYMBALTA) 60 MG capsule Take 60 mg by mouth daily.   Yes Historical Provider, MD  esomeprazole (NEXIUM) 40 MG capsule Take 40 mg by mouth 2 (two) times daily before a  meal.    Yes Historical Provider, MD  estrogens, conjugated, (PREMARIN) 0.625 MG tablet Take 0.625 mg by mouth daily.    Yes Historical Provider, MD  Fluticasone-Salmeterol (ADVAIR) 100-50 MCG/DOSE AEPB Inhale 1 puff into the lungs as needed (shortness of breath).    Yes Historical Provider, MD  guaiFENesin (MUCINEX) 600 MG 12 hr tablet Take 1 tablet (600 mg total) by mouth 2 (two) times daily. Patient taking differently: Take 600 mg by mouth 2 (two) times daily as needed for cough or to loosen phlegm.  03/25/15  Yes Erin R Barrett, PA-C  insulin aspart (NOVOLOG) 100 UNIT/ML injection Inject 0-15 Units into the skin 3 (three) times daily with meals. Sliding scale with meals Patient taking differently: Inject 15 Units into the skin 3 (three) times daily with meals. Sliding scale with meals 04/02/15  Yes Domenic Polite, MD  insulin glargine (LANTUS) 100 UNIT/ML injection Inject 0.55 mLs (55 Units total) into the skin at bedtime. 04/02/15  Yes Domenic Polite, MD  levothyroxine (SYNTHROID, LEVOTHROID) 200 MCG tablet Take 200 mcg by mouth daily before breakfast.   Yes Historical Provider, MD  meclizine (ANTIVERT) 25 MG tablet Take 25 mg by mouth 3 (three) times daily as needed for dizziness.   Yes Historical Provider, MD  metFORMIN (GLUCOPHAGE-XR) 500 MG 24 hr tablet Take 1,000 mg by mouth 2 (two) times daily.   Yes Historical Provider, MD  ondansetron (ZOFRAN) 4 MG tablet Take 4 mg by mouth every 8 (eight) hours as needed for nausea or vomiting.   Yes Historical Provider, MD  oxyCODONE (OXY IR/ROXICODONE) 5 MG immediate release tablet Take 1-2 tablets (5-10 mg total) by mouth every 4 (four) hours as needed for severe pain. 04/19/15  Yes Gaye Pollack, MD  Pirfenidone (ESBRIET) 267 MG CAPS Days 1-7: 1 cap three times daily.  Days 8-14: 2 caps three times daily, Days 15 onward: 3 caps three times daily. 04/09/15  Yes Tammy S Parrett, NP  predniSONE (DELTASONE) 10 MG tablet 30mg  for 1 week then 20mg  daily and  hold Patient taking differently: Take 20 mg by mouth daily with breakfast.  04/09/15  Yes Tammy S Parrett, NP  topiramate (TOPAMAX) 25 MG tablet Take 25 mg by mouth 2 (two) times daily.   Yes Historical Provider, MD  traZODone (DESYREL) 150 MG tablet Take 75 mg by mouth at bedtime.   Yes Historical Provider, MD  triamterene-hydrochlorothiazide (MAXZIDE-25) 37.5-25 MG per tablet Take 1 tablet by mouth every evening.   Yes Historical Provider, MD  vitamin E 400 UNIT capsule Take 800 Units by mouth daily.   Yes Historical Provider, MD   BP 153/87 mmHg  Pulse 80  Temp(Src) 98 F (36.7 C)  Resp 24  Wt 183 lb (83.008 kg)  SpO2 94% Physical Exam  Constitutional: She is oriented to person, place, and time. She appears well-developed and well-nourished. No distress.  Nontoxic appearing.  HENT:  Head: Normocephalic and atraumatic.  Mouth/Throat: Oropharynx is clear and moist. No oropharyngeal exudate.  Eyes: Conjunctivae are normal. Pupils are equal, round, and reactive to light. Right eye exhibits no discharge. Left eye exhibits no discharge.  Neck: Neck supple. No JVD present. No tracheal deviation present.  Cardiovascular: Normal rate, regular rhythm, normal heart sounds and intact distal pulses.  Exam reveals no gallop and no friction rub.   No murmur heard. Bilateral radial, posterior tibialis and dorsalis pedis pulses are intact.    Pulmonary/Chest: Effort normal. No respiratory distress. She has no wheezes. She has no rales.  Dry crackles bilaterally. No wheezing or rales. Patient speaking in full sentences. Respirations are 24.  Abdominal: Soft. She exhibits no distension. There is no tenderness.  Musculoskeletal: She exhibits edema. She exhibits no tenderness.  Mild bilateral lower extremity edema. No calve tenderness or erythema. Bilateral calves are equal in size.   Lymphadenopathy:    She has no cervical adenopathy.  Neurological: She is alert and oriented to person, place, and time.  Coordination normal.  Skin: Skin is warm and dry. No rash noted. She is not diaphoretic. No erythema. No pallor.  Psychiatric: She has a normal mood and affect. Her behavior is normal.  Nursing note and vitals reviewed.   ED Course  Procedures (including critical care time) Labs Review Labs Reviewed  CBC - Abnormal; Notable for the following:    RBC 3.66 (*)  Hemoglobin 10.1 (*)    HCT 31.5 (*)    All other components within normal limits  BASIC METABOLIC PANEL - Abnormal; Notable for the following:    Glucose, Bld 141 (*)    Creatinine, Ser 1.04 (*)    GFR calc non Af Amer 60 (*)    All other components within normal limits  BRAIN NATRIURETIC PEPTIDE  I-STAT TROPOININ, ED    Imaging Review Dg Chest 2 View  04/23/2015   CLINICAL DATA:  Chest tightness for 2 days. Hypertension, diabetes, asthma. Shortness of breath.  EXAM: CHEST  2 VIEW  COMPARISON:  04/19/2015  FINDINGS: Diffuse bilateral airspace parenchymal disease in the lungs with low lung volumes consistent with chronic fibrosis. No definite superimposed consolidation. Normal heart size and pulmonary vascularity. No blunting of costophrenic angles. No pneumothorax. Postoperative changes in the cervical spine. Small bilateral cervical ribs.  IMPRESSION: Chronic pulmonary fibrosis. No significant change since prior study.   Electronically Signed   By: Lucienne Capers M.D.   On: 04/23/2015 22:27     EKG Interpretation   Date/Time:  Tuesday April 23 2015 21:48:40 EDT Ventricular Rate:  81 PR Interval:  114 QRS Duration: 78 QT Interval:  370 QTC Calculation: 429 R Axis:   87 Text Interpretation:  Normal sinus rhythm Normal ECG Confirmed by OTTER   MD, OLGA ([id number]) on 04/24/2015 12:13:06 AM      Filed Vitals:   04/23/15 2147 04/23/15 2345 04/24/15 0036  BP: 134/81 137/83 153/87  Pulse: 89 82 80  Temp: 98 F (36.7 C)    Resp: 20 24 24   Weight: 183 lb (83.008 kg)    SpO2: 100% 100% 94%     MDM   Meds given in  ED:  Medications  methylPREDNISolone sodium succinate (SOLU-MEDROL) 125 mg/2 mL injection 125 mg (not administered)    New Prescriptions   No medications on file    Final diagnoses:  SOB (shortness of breath)  Hypoxia  Pulmonary fibrosis   This is a 54 y.o. female with a history of pulmonary fibrosis, asthma, diabetes, and is oxygen dependent who presents to the emergency department complaining of worsening shortness of breath since yesterday as well as bilateral lower extremity edema. Patient reports she recently decreased her present dose to 20 mg yesterday. Patient was directed to the emergency department by pulmonologist Dr. Oletta Darter. On exam patient is afebrile and nontoxic appearing. She has dry crackles bilaterally. She is speaking in full sentences. She reports she has much worse shortness of breath with exertion. She reports this is more than usual. She reports only a dry cough. Patient's troponin is negative. Her hemoglobin is stable. Her BMP is unremarkable. Her BNP is 77. Chest x-ray shows chronic pulmonary fibrosis with no significant change from her previous study.  The patient was ambulated with oxygen and her oxygen saturation dropped to 74%. She reports feeling much worse short of breath with ambulation than usual. Will give patient solumedrol and consult for admission for hypoxia.  Consulted for admission with Dr. Hal Hope who accepted the patient for admission. Temporary admission orders placed for telemetry bed. The patient is in agreement with admission.  This patient was discussed with Dr. Sharol Given who agrees with assessment and plan.    Waynetta Pean, PA-C 04/24/15 0110  Linton Flemings, MD 04/24/15 [phone]

## 2015-04-24 NOTE — ED Notes (Signed)
Ambulated ( with O2 3 lpm) pt and SPO2 initially was 88%-90% then dropped to 77% and continued to drop until pt returned to room and rested.

## 2015-04-24 NOTE — Discharge Instructions (Addendum)
°  Limit amount of salt intake, limit daily fluid intake to <2L per day Weight yourself every morning, contact cardiology if weight increase by more than 3 lbs overnight or 5 lbs in a single week. Contact your primary care physician or pulmonologist or return to ED if you have  increasing SOB.

## 2015-04-24 NOTE — Progress Notes (Signed)
Pt ambulated 24ft, while ambulating pt oxygen sat dropped to 86% on 3L. Pt returned to room, oxygen sat returned to 95% on 3L while pt resting in bed. MD notified. Will continue to monitor.

## 2015-04-25 ENCOUNTER — Inpatient Hospital Stay (HOSPITAL_COMMUNITY): Payer: 59

## 2015-04-25 DIAGNOSIS — J84112 Idiopathic pulmonary fibrosis: Secondary | ICD-10-CM

## 2015-04-25 DIAGNOSIS — R600 Localized edema: Secondary | ICD-10-CM

## 2015-04-25 LAB — GLUCOSE, CAPILLARY
Glucose-Capillary: 93 mg/dL (ref 65–99)
Glucose-Capillary: 167 mg/dL — ABNORMAL HIGH (ref 65–99)
Glucose-Capillary: 347 mg/dL — ABNORMAL HIGH (ref 65–99)

## 2015-04-25 MED ORDER — FUROSEMIDE 40 MG PO TABS: 40.0000 mg | ORAL_TABLET | Freq: Every day | ORAL | Status: DC

## 2015-04-25 NOTE — Consult Note (Signed)
   [facility] CM Inpatient Consult   04/25/2015  Joann [name] 1961-01-21 [id number] Patient is currently active with [facility] LTW program for chronic disease management services.  Patient has been referred for [facility] Management services for post hospital follow up and support  Patient will receive a post discharge transition of care call and will be evaluated for monthly home visits for assessments and disease process education.  Patient states she has had several admissions lately.  Made Inpatient Case Manager aware that [facility] Management following. Of note, [facility] Care Management services does not replace or interfere with any services that are arranged by inpatient case management or social work.  For additional questions or referrals please contact: Natividad Brood, RN BSN [facility] Liaison  [phone] business mobile phone

## 2015-04-25 NOTE — Progress Notes (Signed)
Pt ambulated 200 ft, while ambulating pt oxygen sat was 93% on 3L. Pt resting in bed now oxygen sat is 95% on 3L. Will continue to monitor.

## 2015-04-25 NOTE — Progress Notes (Signed)
[facility]  Chart reviewed, patient diuresed, now on 3L O2 Being discharged Has f/u with Ramaswamy 6/21 Refer to recs from yesterday's note  Roselie Awkward, MD [place] [facility] Pager: [phone] Cell: [phone] After 3pm or if no response, call [phone]

## 2015-04-25 NOTE — Discharge Summary (Signed)
Physician Discharge Summary  Joann [name] KDX:[id number] DOB: [birth year] DOA: 04/23/2015  PCP: Velna Hatchet, MD  Admit date: 04/23/2015 Discharge date: 04/25/2015  Time spent: 25 minutes  Recommendations for Outpatient Follow-up:  Discharge home with outpatient follow-up with pulmonologist in next week.  Patient is being discharged on tapering dose of prednisone and should be on maintenance dose of 10 mg daily by early next week. She is also being discharged on 40 mg Lasix daily and A. fib shortness of breath and leg edema improved during outpatient follow-up the dose can be used or discontinued.   Discharge Diagnoses:  Principal Problem:   Acute on chronic respiratory failure with hypoxia  Active Problems:   HTN (hypertension)   ILD (interstitial lung disease)/ usual interstitial pneumonitis   Diabetes mellitus type 2, controlled   Hypertension   Hypothyroidism   IPF (idiopathic pulmonary fibrosis)   OSA (obstructive sleep apnea)   Discharge Condition: Fair  Diet recommendation: Low sodium/Diabetic  Filed Weights   04/23/15 2147 04/24/15 0338 04/25/15 0539  Weight: 83.008 kg (183 lb) 85.367 kg (188 lb 3.2 oz) 82.237 kg (181 lb 4.8 oz)    History of present illness:  Reason for to admission H&P for details, in brief, 54 y.o. female with a recent diagnosis of UIP and was placed on Pirfenidone which patient was planning to start on June 16 presents to the ER because of worsening shortness of breath and lower extremity edema over the last 3 days.  Hospital Course:  Acute on chronic hypoxic respiratory failure Secondary to flare up of underlying UIP. Patient to 40 mg oral prednisone with plan on taper to maintenance dose of 10 mg daily by early next week.  Added pulmocort and brovana nebs. pt now maintaining o2 sat in mid 90s on 2-3 L via [place] . Received 2 doses of IV Lasix on 6/15. Appreciate Pulm eval. chest x-ray this morning unchanged. Patient started on pirfenidone.  Follow-up with Dr. Chase Caller next week. -I will discharge her on her home inhalers and add 40 mg oral Lasix daily.  Type 2 DM Glucose stable. Resume metformin.  Essesntial HTN Stable. Continue home medications  Leg edema Recent 2-D echo with grade 1 diastolic dysfunction. Received 2 doses of IV Lasix 40 mg on 6/15. I will discharge her on oral Lasix 40 mg daily. Doppler lower extremity negative for DVT. Leg edema has improved.  Diet: Diabetic  Code Status: Full code Family Communication: None at bedside Disposition Plan: Home    Consultants:  Pulmonary  Procedures:  None  Antibiotics:  None    Procedures:  Doppler lower extremity  Consultations:  Pulmonary  Discharge Exam: Filed Vitals:   04/25/15 0539  BP: 119/75  Pulse: 74  Temp: 98 F (36.7 C)  Resp:      General: Middle aged female in no acute distress  HEENT: No pallor, moist oral mucosa, supple neck, no JVD  Chest: Bibasilar coarse crackles, no rhonchi or wheeze  CVS: Normal S1 and S2, no murmurs rub or gallop  GI: Soft, nondistended, nontender, bowel sounds present  Musculoskeletal: Warm, trace pitting edema bilaterally  CNS: Alert and oriented  Discharge Instructions    Current Discharge Medication List    START taking these medications   Details  furosemide (LASIX) 40 MG tablet Take 1 tablet (40 mg total) by mouth daily. Qty: 30 tablet, Refills: 0      CONTINUE these medications which have CHANGED   Details  predniSONE (DELTASONE) 10 MG tablet Take  40 mg daily for 3 days, then 30 mg daily for next 3 days, then 20 mg daily for next 3 days and then 10 daily Qty: 75 tablet, Refills: 1      CONTINUE these medications which have NOT CHANGED   Details  albuterol (PROVENTIL HFA;VENTOLIN HFA) 108 (90 BASE) MCG/ACT inhaler Inhale 2 puffs into the lungs every 6 (six) hours as needed. For shortness of breath    ALPRAZolam (XANAX) 0.5 MG tablet Take 0.5 mg by mouth daily as needed  for anxiety.     amLODipine (NORVASC) 5 MG tablet Take 5 mg by mouth every morning.     cholecalciferol (VITAMIN D) 1000 UNITS tablet Take 1,000 Units by mouth daily.    dicyclomine (BENTYL) 20 MG tablet Take 20 mg by mouth every 6 (six) hours.    DULoxetine (CYMBALTA) 60 MG capsule Take 60 mg by mouth daily.    esomeprazole (NEXIUM) 40 MG capsule Take 40 mg by mouth 2 (two) times daily before a meal.     estrogens, conjugated, (PREMARIN) 0.625 MG tablet Take 0.625 mg by mouth daily.     Fluticasone-Salmeterol (ADVAIR) 100-50 MCG/DOSE AEPB Inhale 1 puff into the lungs as needed (shortness of breath).     guaiFENesin (MUCINEX) 600 MG 12 hr tablet Take 1 tablet (600 mg total) by mouth 2 (two) times daily.    insulin aspart (NOVOLOG) 100 UNIT/ML injection Inject 0-15 Units into the skin 3 (three) times daily with meals. Sliding scale with meals    insulin glargine (LANTUS) 100 UNIT/ML injection Inject 0.55 mLs (55 Units total) into the skin at bedtime.    levothyroxine (SYNTHROID, LEVOTHROID) 200 MCG tablet Take 200 mcg by mouth daily before breakfast.    meclizine (ANTIVERT) 25 MG tablet Take 25 mg by mouth 3 (three) times daily as needed for dizziness.    metFORMIN (GLUCOPHAGE-XR) 500 MG 24 hr tablet Take 1,000 mg by mouth 2 (two) times daily.    ondansetron (ZOFRAN) 4 MG tablet Take 4 mg by mouth every 8 (eight) hours as needed for nausea or vomiting.    oxyCODONE (OXY IR/ROXICODONE) 5 MG immediate release tablet Take 1-2 tablets (5-10 mg total) by mouth every 4 (four) hours as needed for severe pain. Qty: 30 tablet, Refills: 0    Pirfenidone (ESBRIET) 267 MG CAPS Days 1-7: 1 cap three times daily.  Days 8-14: 2 caps three times daily, Days 15 onward: 3 caps three times daily. Qty: 270 capsule, Refills: 5    topiramate (TOPAMAX) 25 MG tablet Take 25 mg by mouth 2 (two) times daily.    traZODone (DESYREL) 150 MG tablet Take 75 mg by mouth at bedtime.     triamterene-hydrochlorothiazide (MAXZIDE-25) 37.5-25 MG per tablet Take 1 tablet by mouth every evening.    vitamin E 400 UNIT capsule Take 800 Units by mouth daily.       Allergies  Allergen Reactions  . Peanut-Containing Drug Products Shortness Of Breath    Pecans, walnuts as well  . Sulfa Antibiotics Hives and Shortness Of Breath  . Betadine [Povidone Iodine] Rash  . Latex Rash   Follow-up Information    Follow up with Velna Hatchet, MD In 2 weeks.   Specialty:  Internal Medicine   Contact information:   [address] [place] [address] [phone]       Follow up with [facility], MD. Go on 04/30/2015.   Specialty:  Pulmonary Disease   Contact information:   [place]  [place] [address] [phone]        The results of significant diagnostics from this hospitalization (including imaging, microbiology, ancillary and laboratory) are listed below for reference.    Significant Diagnostic Studies: Dg Chest 2 View  04/25/2015   CLINICAL DATA:  Short of breath for several days  EXAM: CHEST  2 VIEW  COMPARISON:  Chest x-ray of 04/23/2015  FINDINGS: Coarse interstitial markings remain bilaterally consistent with pulmonary fibrosis. No focal infiltrate or effusion is seen. Mild cardiomegaly is stable. A lower anterior cervical spine fusion plate is present.  IMPRESSION: Little change in pulmonary fibrosis and cardiomegaly. No definite active process.   Electronically Signed   By: Ivar Drape M.D.   On: 04/25/2015 08:19   Dg Chest 2 View  04/23/2015   CLINICAL DATA:  Chest tightness for 2 days. Hypertension, diabetes, asthma. Shortness of breath.  EXAM: CHEST  2 VIEW  COMPARISON:  04/19/2015  FINDINGS: Diffuse bilateral airspace parenchymal disease in the lungs with low lung volumes consistent with chronic fibrosis. No definite superimposed consolidation. Normal heart size and pulmonary vascularity. No blunting of costophrenic angles. No pneumothorax.  Postoperative changes in the cervical spine. Small bilateral cervical ribs.  IMPRESSION: Chronic pulmonary fibrosis. No significant change since prior study.   Electronically Signed   By: Lucienne Capers M.D.   On: 04/23/2015 22:27   Dg Chest 2 View  04/19/2015   CLINICAL DATA:  Interstitial lung disease. Shortness of breath and pain in the chest. Recent lung biopsy.  EXAM: CHEST  2 VIEW  COMPARISON:  04/09/2015  FINDINGS: A tiny right apical pneumothorax (less than 5%) could persist, but would be unchanged. Stable appearance of fibrotic lung disease with low lung volumes. Stable heart size and mediastinal contours, including bilateral hilar and mediastinal lymphadenopathy. There is no edema, consolidation, effusion, or pneumothorax.  IMPRESSION: 1. An interface at the right apex could be osseous or from stable trace pneumothorax. 2. Pulmonary fibrosis without new opacity.   Electronically Signed   By: Monte Fantasia M.D.   On: 04/19/2015 10:55   Dg Chest 2 View  04/09/2015   CLINICAL DATA:  Pneumonia.  Pneumothorax, and thoracostomy.  EXAM: CHEST - 2 VIEW  COMPARISON:  04/01/2015 and 03/30/2015.  FINDINGS: The heart is enlarged. A chronic interstitial pattern is again noted. A small right-sided pneumothorax is again suspected. No significant left-sided pneumothorax is present. The visualized soft tissues and bony thorax are unremarkable.  IMPRESSION: 1. A small right-sided pneumothorax is again suspected. 2. Stable cardiomegaly and interstitial lung disease. These results will be called to the ordering clinician or representative by the Radiologist Assistant, and communication documented in the PACS or zVision Dashboard.   Electronically Signed   By: San Morelle M.D.   On: 04/09/2015 09:24   Dg Chest 2 View  04/01/2015   CLINICAL DATA:  Pulmonary fibrosis .  EXAM: CHEST  2 VIEW  COMPARISON:  Chest x-ray 03/29/2015 and 03/21/2015. CT chest 03/29/2015 and 02/28/2015.  FINDINGS: Mediastinum and  hilar structures are stable. Heart size is stable. Diffuse pulmonary interstitial prominence again noted consistent with chronic interstitial lung disease. Previously identified right pneumothorax has resolved. Prior cervical spine fusion. Prior cholecystectomy. Surgical clips left upper quadrant.  IMPRESSION: 1. Unchanged bilateral pulmonary interstitial prominence consistent with chronic interstitial lung disease. Superimposed interstitial edema and/or pneumonitis cannot be excluded. 2. Interim resolution of right pneumothorax.   Electronically Signed   By: Marcello Moores  Register   On: 04/01/2015 07:37   Dg Chest  2 View  03/28/2015   CLINICAL DATA:  Shortness of breath, weakness for 4 days  EXAM: CHEST  2 VIEW  COMPARISON:  03/24/2015  FINDINGS: There is a persistent small right pneumothorax which has decreased in size compared with 03/24/2015.  There is no pleural effusion. There is no left pneumothorax. There is bilateral diffuse chronic interstitial lung disease. Stable cardiomediastinal silhouette. No acute osseous abnormality.  IMPRESSION: 1. Persistent small right pneumothorax which has decreased in size compared with 03/24/2015.   Electronically Signed   By: Kathreen Devoid   On: 03/28/2015 17:33   Ct Angio Chest Pe W/cm &/or Wo Cm  03/29/2015   CLINICAL DATA:  Acute onset of shortness of breath. Initial encounter.  EXAM: CT ANGIOGRAPHY CHEST WITH CONTRAST  TECHNIQUE: Multidetector CT imaging of the chest was performed using the standard protocol during bolus administration of intravenous contrast. Multiplanar CT image reconstructions and MIPs were obtained to evaluate the vascular anatomy.  CONTRAST:  81mL OMNIPAQUE IOHEXOL 350 MG/ML SOLN  COMPARISON:  High-resolution CT of the chest performed 02/28/2015, and chest radiograph performed 03/28/2015  FINDINGS: There is no evidence of significant pulmonary embolus. Evaluation for pulmonary embolus is suboptimal due to surrounding airspace opacification.  There  has been rapid interval progression of scattered bilateral airspace opacification, which is thought to be superimposed on the patient's underlying interstitial lung disease. This most likely reflects an acute superimposed pneumonia; this degree of rapid progression of interstitial lung disease would be highly unusual. Underlying extensive areas of peripheral subpleural and peribronchovascular reticulation are again noted, with marked thickening of the peribronchovascular interstitium, areas of ground-glass attenuation and mild honeycombing, and cylindrical and mild varicose bronchiectasis. A trace right-sided pneumothorax is again noted, reflecting recent biopsy. No pleural effusion is seen.  Scattered prominent bilateral hilar, subcarinal, azygoesophageal recess, periaortic and right paratracheal nodes are seen, measuring up to 1.4 cm in short axis. Trace air within the epicardial fat likely reflects the adjacent trace pneumothorax. Mild adjacent high attenuation may reflect post biopsy changes. No pericardial effusion is identified. The great vessels are grossly unremarkable in appearance. The thyroid gland is diminutive and grossly unremarkable. No axillary lymphadenopathy is seen.  The visualized portions of the liver and spleen are unremarkable. The patient is status post cholecystectomy, with clips noted along the gallbladder fossa. The visualized portions of the pancreas and adrenal glands are within normal limits.  No acute osseous abnormalities are seen.  Review of the MIP images confirms the above findings.  IMPRESSION: 1. No evidence of significant pulmonary embolus. 2. Rapid interval progression of scattered bilateral airspace opacification. This is thought to be superimposed on the underlying interstitial lung disease, and most likely reflects an acute superimposed pneumonia. This degree of rapid progression in interstitial lung disease would be highly unusual. 3. Underlying interstitial lung disease  again noted. As noted on high-resolution CT, this most likely reflects usual interstitial pneumonia, though severe fibrotic nonspecific interstitial pneumonia (NSIP) could have a similar appearance. 4. Trace right-sided pneumothorax reflects recent biopsy. 5. Bilateral hilar and mediastinal lymphadenopathy again noted.   Electronically Signed   By: Garald Balding M.D.   On: 03/29/2015 01:37   Dg Chest Port 1 View  03/30/2015   CLINICAL DATA:  Respiratory failure.  Followup right pneumothorax.  EXAM: PORTABLE CHEST - 1 VIEW  COMPARISON:  03/29/2015.  FINDINGS: Decreased size of a small right lateral pneumothorax. No significant change in enlargement of the cardiac silhouette, prominence of the interstitial markings and airspace opacity in  the majority at the left lung. Thoracic spine degenerative changes and cervical spine fixation hardware.  IMPRESSION: 1. Approximately 5% right lateral pneumothorax with mild improvement. 2. No significant change in diffuse left lung alveolar edema or pneumonia. 3. Stable cardiomegaly and interstitial lung disease.   Electronically Signed   By: Claudie Revering M.D.   On: 03/30/2015 10:31   Dg Chest Port 1 View  03/29/2015   CLINICAL DATA:  Respiratory failure.  EXAM: PORTABLE CHEST - 1 VIEW  COMPARISON:  Chest CT earlier this day. Radiographs obtained yesterday per  FINDINGS: Small right lateral pneumothorax, unchanged. Lower lung volumes from prior, question mild progression of perihilar airspace disease, versus differences in technique. Underlying chronic lung disease again seen. Mediastinal contours are unchanged.  IMPRESSION: 1. Small right pneumothorax is unchanged from prior. 2. Question progressive perihilar airspace disease versus related to lower lung volumes.   Electronically Signed   By: Jeb Levering M.D.   On: 03/29/2015 04:58    Microbiology: Recent Results (from the past 240 hour(s))  MRSA PCR Screening     Status: None   Collection Time: 04/24/15  3:56 AM   Result Value Ref Range Status   MRSA by PCR NEGATIVE NEGATIVE Final    Comment:        The GeneXpert MRSA Assay (FDA approved for NASAL specimens only), is one component of a comprehensive MRSA colonization surveillance program. It is not intended to diagnose MRSA infection nor to guide or monitor treatment for MRSA infections.      Labs: Basic Metabolic Panel:  Recent Labs Lab 04/23/15 2153 04/24/15 0511  NA 138 138  K 4.2 4.3  CL 103 101  CO2 25 26  GLUCOSE 141* 201*  BUN 20 23*  CREATININE 1.04* 1.03*  CALCIUM 9.3 9.0   Liver Function Tests:  Recent Labs Lab 04/23/15 1406 04/24/15 0511  AST 14 22  ALT 22 27  ALKPHOS 74 78  BILITOT 0.3 0.7  PROT 6.5 6.8  ALBUMIN 3.6 3.2*   No results for input(s): LIPASE, AMYLASE in the last 168 hours. No results for input(s): AMMONIA in the last 168 hours. CBC:  Recent Labs Lab 04/23/15 2153 04/24/15 0511  WBC 10.0 10.8*  NEUTROABS  --  8.8*  HGB 10.1* 11.0*  HCT 31.5* 34.4*  MCV 86.1 86.4  PLT 302 315   Cardiac Enzymes: No results for input(s): CKTOTAL, CKMB, CKMBINDEX, TROPONINI in the last 168 hours. BNP: BNP (last 3 results)  Recent Labs  02/28/15 1152 03/28/15 2241 04/23/15 2153  BNP 66.7 17.3 77.3    ProBNP (last 3 results) No results for input(s): PROBNP in the last 8760 hours.  CBG:  Recent Labs Lab 04/24/15 0739 04/24/15 1118 04/24/15 1653 04/24/15 2107 04/25/15 0738  GLUCAP 256* 307* 262* 324* 93       Signed:  Shashank Kwasnik  [facility] 04/25/2015, 10:03 AM

## 2015-04-26 ENCOUNTER — Telehealth (HOSPITAL_COMMUNITY): Payer: Self-pay

## 2015-04-26 NOTE — Progress Notes (Signed)
Quick Note:  Pt was admitted on 04/23/15. TP is aware. ______

## 2015-04-26 NOTE — Telephone Encounter (Signed)
Called patient regarding entrance to Pulmonary Rehab.  Patient states that they are interested in attending the program.  Tykeisha is going to verify insurance coverage and follow up.

## 2015-04-29 ENCOUNTER — Ambulatory Visit (INDEPENDENT_AMBULATORY_CARE_PROVIDER_SITE_OTHER): Payer: 59 | Admitting: Internal Medicine

## 2015-04-29 ENCOUNTER — Telehealth (HOSPITAL_COMMUNITY): Payer: Self-pay

## 2015-04-29 DIAGNOSIS — J849 Interstitial pulmonary disease, unspecified: Secondary | ICD-10-CM | POA: Diagnosis not present

## 2015-04-29 LAB — PULMONARY FUNCTION TEST
DL/VA % PRED: 90 %
DL/VA: 3.85 ml/min/mmHg/L
DLCO UNC: 8.43 ml/min/mmHg
DLCO unc % pred: 44 %
FEF 25-75 PRE: 3.75 L/s
FEF 25-75 Post: 4.58 L/sec
FEF2575-%Change-Post: 22 %
FEF2575-%PRED-POST: 195 %
FEF2575-%PRED-PRE: 160 %
FEV1-%CHANGE-POST: 9 %
FEV1-%Pred-Post: 58 %
FEV1-%Pred-Pre: 53 %
FEV1-Post: 1.37 L
FEV1-Pre: 1.25 L
FEV1FVC-%CHANGE-POST: 2 %
FEV1FVC-%Pred-Pre: 120 %
FEV6-%Change-Post: 4 %
FEV6-%PRED-POST: 47 %
FEV6-%PRED-PRE: 45 %
FEV6-PRE: 1.32 L
FEV6-Post: 1.37 L
FEV6FVC-%PRED-POST: 103 %
FEV6FVC-%PRED-PRE: 103 %
FVC-%Change-Post: 7 %
FVC-%Pred-Post: 47 %
FVC-%Pred-Pre: 44 %
FVC-POST: 1.41 L
FVC-PRE: 1.32 L
POST FEV6/FVC RATIO: 100 %
Post FEV1/FVC ratio: 97 %
Pre FEV1/FVC ratio: 95 %
Pre FEV6/FVC Ratio: 100 %

## 2015-04-29 NOTE — Telephone Encounter (Signed)
Called and left message with Cecille Rubin after confirming insurance coverage for pulmonary rehab. Requested Cecille Rubin call back to discuss entrance into the program.

## 2015-04-29 NOTE — Progress Notes (Signed)
PFT done today. 

## 2015-04-30 ENCOUNTER — Encounter: Payer: Self-pay | Admitting: Internal Medicine

## 2015-04-30 ENCOUNTER — Ambulatory Visit (INDEPENDENT_AMBULATORY_CARE_PROVIDER_SITE_OTHER)
Admission: RE | Admit: 2015-04-30 | Discharge: 2015-04-30 | Disposition: A | Discharge status: Home / Self Care | Payer: 59 | Source: Ambulatory Visit | Attending: Internal Medicine | Admitting: Internal Medicine

## 2015-04-30 ENCOUNTER — Ambulatory Visit (INDEPENDENT_AMBULATORY_CARE_PROVIDER_SITE_OTHER): Payer: 59 | Admitting: Internal Medicine

## 2015-04-30 VITALS — BP 92/58 | HR 124 | Ht 59.0 in | Wt 181.0 lb

## 2015-04-30 DIAGNOSIS — R42 Dizziness and giddiness: Secondary | ICD-10-CM | POA: Diagnosis not present

## 2015-04-30 DIAGNOSIS — J84112 Idiopathic pulmonary fibrosis: Secondary | ICD-10-CM | POA: Diagnosis not present

## 2015-04-30 DIAGNOSIS — J9611 Chronic respiratory failure with hypoxia: Secondary | ICD-10-CM

## 2015-04-30 DIAGNOSIS — R079 Chest pain, unspecified: Secondary | ICD-10-CM

## 2015-04-30 NOTE — Patient Instructions (Addendum)
ICD-9-CM ICD-10-CM   1. Right-sided chest pain 786.50 R07.9 DG Chest 2 View  2. Dizziness 780.4 R42   3. IPF (idiopathic pulmonary fibrosis) 516.31 J84.112   4. Chronic respiratory failure with hypoxia 518.83 J96.11    799.02       #chest pain right side - new  - unclear cause  - do CXR 2 view today  #Dizziness  - new  - likely related to lower blood pressure that could be related to diuresis - cut lasix down to 20mg  daily - stop amlodipine  - stop HCTZ  #IPF with chronic resp failure  - disease is moderate-severe; unchanged  - continue o2 , 3L  - continue esbriet slow titration upwards  - keep up derm  appt for Rt elbow nodule - drop prednisone down to 10mg  per day and at followup will slow taper it off  #Followup  -05/03/15 or 05/06/15 to see me - ER if worse (respect desire to refuse admit)

## 2015-04-30 NOTE — Progress Notes (Signed)
Subjective:    Patient ID: Joann [name], female    DOB: Sep 23, 1961, 54 y.o.   MRN: [id number]  HPI   OV 04/30/2015 - followup visit  Chief Complaint  Patient presents with  . Follow-up    Pt recently admitted to [facility] for hypoxia. Pt c/o severe upper right CP (8/10) that is constant, nothing has relieved the pain and has been present since before pt left home for appt. Pt stated her SOB is still not at baseline, minimal cough. pt denies chest congestion.    #IPF with chronic resp failure (below info from chart extraction of old chart)   - she was diagnose with ILD April 2016 during an admission. RF was only autimmune positive. Otherwise no clinical evidence of collagen vas disease. 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 Diagnosis of IPF made. Since biopsy mid-may she has had 2 hospiotalizations 03/28/15 yhrough 04/02/15 for UIP flare. Started on pred burst. Discussion IPF dx and RX;she opted for Esbriet (only started ppast few days after approval).  Then 04/23/15 - 04/25/15 considered volime overload +/- UIP flare. Diuresis started. She started esbiret 1 tab tid starting dose 04/25/15/  Presents for followup  6/121/15. Tolerating Esbriet ok but for mild indigestion. Using 3L [place]. Breathing so so - having class 3-4 dys0pnea ; still better than hospitalization > 1 week ago. PFTs - 04/29/15- FVC 1.32L/44%,  DLCO 8.43/44% - c/w moderate/severe IPF   - Of note has seen rheum: has Rt elbow nodule - referred to derm for biopsy which is pending  - CT angio 02/28/15 and 03/29/15 - negative for PE (reviewed report did not visualize image)  - labs 04/24/15 - Creat 1mg %, LFT ok, BNP 77, trop 0.0   # New chest pain right side today. Started acutely this morning. INtermittent to constant. Moderate severity. No radiation. No clear cut aggravating or relieving factor  # new finding - RN noticed patient to be running soft bp and being tachcyardic. She confirmed mild orthostasis. Patient reports  singificant diuresis with lasix 40mg  daily, In addition she is on baseline HCTZ and amlodipine. This now associated with dizziness.   Patient does not want admission for any of these issues    Current outpatient prescriptions:  .  albuterol (PROVENTIL HFA;VENTOLIN HFA) 108 (90 BASE) MCG/ACT inhaler, Inhale 2 puffs into the lungs every 6 (six) hours as needed. For shortness of breath, Disp: , Rfl:  .  amLODipine (NORVASC) 5 MG tablet, Take 5 mg by mouth every morning. , Disp: , Rfl:  .  cholecalciferol (VITAMIN D) 1000 UNITS tablet, Take 1,000 Units by mouth daily., Disp: , Rfl:  .  dicyclomine (BENTYL) 20 MG tablet, Take 20 mg by mouth every 6 (six) hours., Disp: , Rfl:  .  DULoxetine (CYMBALTA) 60 MG capsule, Take 60 mg by mouth daily., Disp: , Rfl:  .  esomeprazole (NEXIUM) 40 MG capsule, Take 40 mg by mouth 2 (two) times daily before a meal. , Disp: , Rfl:  .  estrogens, conjugated, (PREMARIN) 0.625 MG tablet, Take 0.625 mg by mouth daily. , Disp: , Rfl:  .  Fluticasone-Salmeterol (ADVAIR) 100-50 MCG/DOSE AEPB, Inhale 1 puff into the lungs as needed (shortness of breath). , Disp: , Rfl:  .  furosemide (LASIX) 40 MG tablet, Take 1 tablet (40 mg total) by mouth daily., Disp: 30 tablet, Rfl: 0 .  guaiFENesin (MUCINEX) 600 MG 12 hr tablet, Take 1 tablet (600 mg total) by mouth 2 (two) times daily. (  Patient taking differently: Take 600 mg by mouth 2 (two) times daily as needed for cough or to loosen phlegm. ), Disp: , Rfl:  .  insulin aspart (NOVOLOG) 100 UNIT/ML injection, Inject 0-15 Units into the skin 3 (three) times daily with meals. Sliding scale with meals (Patient taking differently: Inject 15 Units into the skin 3 (three) times daily with meals. Sliding scale with meals), Disp: , Rfl:  .  insulin glargine (LANTUS) 100 UNIT/ML injection, Inject 0.55 mLs (55 Units total) into the skin at bedtime., Disp: , Rfl:  .  levothyroxine (SYNTHROID, LEVOTHROID) 200 MCG tablet, Take 200 mcg by mouth  daily before breakfast., Disp: , Rfl:  .  meclizine (ANTIVERT) 25 MG tablet, Take 25 mg by mouth 3 (three) times daily as needed for dizziness., Disp: , Rfl:  .  metFORMIN (GLUCOPHAGE-XR) 500 MG 24 hr tablet, Take 1,000 mg by mouth 2 (two) times daily., Disp: , Rfl:  .  ondansetron (ZOFRAN) 4 MG tablet, Take 4 mg by mouth every 8 (eight) hours as needed for nausea or vomiting., Disp: , Rfl:  .  oxyCODONE (OXY IR/ROXICODONE) 5 MG immediate release tablet, Take 1-2 tablets (5-10 mg total) by mouth every 4 (four) hours as needed for severe pain., Disp: 30 tablet, Rfl: 0 .  Pirfenidone (ESBRIET) 267 MG CAPS, Days 1-7: 1 cap three times daily.  Days 8-14: 2 caps three times daily, Days 15 onward: 3 caps three times daily., Disp: 270 capsule, Rfl: 5 .  predniSONE (DELTASONE) 10 MG tablet, Take 40 mg daily for 3 days, then 30 mg daily for next 3 days, then 20 mg daily for next 3 days and then 10 daily, Disp: 75 tablet, Rfl: 1 .  topiramate (TOPAMAX) 25 MG tablet, Take 25 mg by mouth 2 (two) times daily., Disp: , Rfl:  .  traZODone (DESYREL) 150 MG tablet, Take 75 mg by mouth at bedtime., Disp: , Rfl:  .  triamterene-hydrochlorothiazide (MAXZIDE-25) 37.5-25 MG per tablet, Take 1 tablet by mouth every evening., Disp: , Rfl:  .  vitamin E 400 UNIT capsule, Take 800 Units by mouth daily., Disp: , Rfl:  .  ALPRAZolam (XANAX) 0.5 MG tablet, Take 0.5 mg by mouth daily as needed for anxiety. , Disp: , Rfl:      Review of Systems  Constitutional: Negative for fever and unexpected weight change.  HENT: Negative for congestion, dental problem, ear pain, nosebleeds, postnasal drip, rhinorrhea, sinus pressure, sneezing, sore throat and trouble swallowing.   Eyes: Negative for redness and itching.  Respiratory: Positive for cough and shortness of breath. Negative for chest tightness and wheezing.   Cardiovascular: Positive for chest pain. Negative for palpitations and leg swelling.  Gastrointestinal: Negative for  nausea and vomiting.  Genitourinary: Negative for dysuria.  Musculoskeletal: Negative for joint swelling.  Skin: Negative for rash.  Neurological: Negative for headaches.  Hematological: Does not bruise/bleed easily.  Psychiatric/Behavioral: Negative for dysphoric mood. The patient is not nervous/anxious.        Objective:   Physical Exam  Constitutional: She is oriented to person, place, and time. She appears well-developed and well-nourished. No distress.  Obese Body mass index is 36.54 kg/(m^2).   HENT:  Head: Normocephalic and atraumatic.  Right Ear: External ear normal.  Left Ear: External ear normal.  Mouth/Throat: Oropharynx is clear and moist. No oropharyngeal exudate.  o2 on  Eyes: Conjunctivae and EOM are normal. Pupils are equal, round, and reactive to light. Right eye exhibits no discharge. Left  eye exhibits no discharge. No scleral icterus.  Neck: Normal range of motion. Neck supple. No JVD present. No tracheal deviation present. No thyromegaly present.  Cardiovascular: Normal rate, regular rhythm, normal heart sounds and intact distal pulses.  Exam reveals no gallop and no friction rub.   No murmur heard. Pulmonary/Chest: Effort normal. No respiratory distress. She has no wheezes. She has rales. She exhibits no tenderness.  Some right parasternal tenderness + Basal crackles +  Abdominal: Soft. Bowel sounds are normal. She exhibits no distension and no mass. There is no tenderness. There is no rebound and no guarding.  Musculoskeletal: Normal range of motion. She exhibits no edema or tenderness.  Rt elbo soft tissue mobile nodule +  Lymphadenopathy:    She has no cervical adenopathy.  Neurological: She is alert and oriented to person, place, and time. She has normal reflexes. No cranial nerve deficit. She exhibits normal muscle tone. Coordination normal.  Skin: Skin is warm and dry. No rash noted. She is not diaphoretic. No erythema. No pallor.  Psychiatric: She has a  normal mood and affect. Her behavior is normal. Judgment and thought content normal.  Vitals reviewed.    Filed Vitals:   04/30/15 1605  BP: 92/58  Pulse: 124  Height: 4\' 11"  (1.499 m)  Weight: 181 lb (82.101 kg)  SpO2: 90%        Assessment & Plan:     ICD-9-CM ICD-10-CM   1. Right-sided chest pain 786.50 R07.9 DG Chest 2 View  2. Dizziness 780.4 R42   3. IPF (idiopathic pulmonary fibrosis) 516.31 J84.112   4. Chronic respiratory failure with hypoxia 518.83 J96.11    799.02        #chest pain right side - new   - unclear cause  - do CXR 2 view today  #Dizziness - new  - likely related to lower blood pressure that could be related to diuresis - cut lasix down to 20mg  daily - stop amlodipine  - stop HCTZ  #IPF with chronic resp failure  - disease is moderate-severe; unchanged  - continue o2 , 3L  - continue esbriet slow titration upwards  - keep up derm  appt for Rt elbow nodule - drop prednisone down to 10mg  per day and at followup will slow taper it off  #Followup  -05/03/15 or 05/06/15 to see me - ER if worse (respect desire to refuse admit)    Dr. Brand Males, M.D., [facility].C.P Pulmonary and Critical Care Medicine Staff Physician [place] [facility] Pager: [phone], If no answer or between  15:00h - 7:00h: call [phone]  05/02/2015 4:52 AM

## 2015-05-02 DIAGNOSIS — R42 Dizziness and giddiness: Secondary | ICD-10-CM | POA: Insufficient documentation

## 2015-05-02 DIAGNOSIS — J9611 Chronic respiratory failure with hypoxia: Secondary | ICD-10-CM | POA: Insufficient documentation

## 2015-05-02 DIAGNOSIS — R079 Chest pain, unspecified: Secondary | ICD-10-CM | POA: Insufficient documentation

## 2015-05-03 ENCOUNTER — Other Ambulatory Visit: Payer: Self-pay | Admitting: *Deleted

## 2015-05-03 ENCOUNTER — Ambulatory Visit (INDEPENDENT_AMBULATORY_CARE_PROVIDER_SITE_OTHER): Payer: 59 | Admitting: Internal Medicine

## 2015-05-03 ENCOUNTER — Encounter: Payer: Self-pay | Admitting: Internal Medicine

## 2015-05-03 VITALS — BP 102/58 | HR 103 | Temp 98.5°F | Ht 59.0 in | Wt 184.4 lb

## 2015-05-03 DIAGNOSIS — J9611 Chronic respiratory failure with hypoxia: Secondary | ICD-10-CM

## 2015-05-03 DIAGNOSIS — R42 Dizziness and giddiness: Secondary | ICD-10-CM

## 2015-05-03 DIAGNOSIS — J84112 Idiopathic pulmonary fibrosis: Secondary | ICD-10-CM

## 2015-05-03 DIAGNOSIS — R079 Chest pain, unspecified: Secondary | ICD-10-CM | POA: Diagnosis not present

## 2015-05-03 NOTE — Patient Instructions (Addendum)
#  chest pain right side - improved - Likely related to muscle following breathing test earlier in the week - Even though it is on the side of the lung biopsy I don't think it's related to the biopsy - Chest x-ray is unchanged - Glad it is improved - Continue to monitor   #Dizziness  - improved/resolved  - Most likely  related to lower blood pressure that could be related to diuresis and antihypertensives - cut lasix down to 20mg  every other day for the next 1-2 weeks and then stop - Avoid  Amlodipine and HCTZ completely  #IPF with chronic resp failure  - disease is moderate-severe; unchanged  - continue o2 , 3L  - continue esbriet slow titration upwards; glad you've gone up to 2 tablets 3 times daily - Ensure unscreen all times  - keep up derm  appt for Rt elbow nodule - drop prednisone down to 5mg  per day x 1 week, then 5mg  every other day x 1 week and then stop (was given for IPF Flare)  #Followup  -LFT blood test in 3 weeks -Rreturn to review in 3 weeks with me or NP:   - review  tolerance of esbriet, and ability to wean off lasix and steroids

## 2015-05-03 NOTE — Patient Outreach (Signed)
Called [employer] member[employer] on her home phone, f/u on referral for  transition of care.  Spouse answered, states [employer] member not home, HIPPA verified on [employer] member.    RN CM provided spouse with contact number for [employer] member to  return call.    If no response, will f/u again 6/27.    [name].   [facility]  [phone]

## 2015-05-03 NOTE — Patient Outreach (Signed)
Attempt made to f/u on referral received today  for transition of care/community nurse case management services.   Recent hospital discharge 6/16.   Unable to leave a voice message on pt's phone, appeared fax machine was in progress.   Plan to try again.      [name].   [place] [facility]  [phone]

## 2015-05-03 NOTE — Progress Notes (Signed)
Subjective:    Patient ID: Joann [name], female    DOB: Mar 19, 1961, 54 y.o.   MRN: [id number]  HPI  OV 04/30/2015 - followup visit  Chief Complaint  Patient presents with  . Follow-up    Pt recently admitted to [facility] for hypoxia. Pt c/o severe upper right CP (8/10) that is constant, nothing has relieved the pain and has been present since before pt left home for appt. Pt stated her SOB is still not at baseline, minimal cough. pt denies chest congestion.    #IPF with chronic resp failure (below info from chart extraction of old chart)   - she was diagnose with ILD April 2016 during an admission. RF was only autimmune positive. Otherwise no clinical evidence of collagen vas disease. 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 Diagnosis of IPF made. Since biopsy mid-may she has had 2 hospiotalizations 03/28/15 yhrough 04/02/15 for UIP flare. Started on pred burst. Discussion IPF dx and RX;she opted for Esbriet (only started ppast few days after approval).  Then 04/23/15 - 04/25/15 considered volime overload +/- UIP flare. Diuresis started. She started esbiret 1 tab tid starting dose 04/25/15/  Presents for followup  6/121/15. Tolerating Esbriet ok but for mild indigestion. Using 3L [place]. Breathing so so - having class 3-4 dys0pnea ; still better than hospitalization > 1 week ago. PFTs - 04/29/15- FVC 1.32L/44%,  DLCO 8.43/44% - c/w moderate/severe IPF   - Of note has seen rheum: has Rt elbow nodule - referred to derm for biopsy which is pending  - CT angio 02/28/15 and 03/29/15 - negative for PE (reviewed report did not visualize image)  - labs 04/24/15 - Creat 1mg %, LFT ok, BNP 77, trop 0.0   # New chest pain right side today. Started acutely this morning. INtermittent to constant. Moderate severity. No radiation. No clear cut aggravating or relieving factor  # new finding - RN notic pedatient to be running soft bp and being tachcyardic. She confirmed mild orthostasis. Patient reports  singificant diuresis with lasix 40mg  daily, In addition she is on baseline HCTZ and amlodi   OV 05/03/2015  Chief Complaint  Patient presents with  . Follow-up    Pt states her chest pain has improved. Pt stated the CP is now intermittent instead of contstant and is rating the pain a 4/10. Pt c/o DOE, dry cough.    Short interval follow-up for the above issues  - IPF with chronic respiratory failure: She has tolerated the reduction in Lasix well. Continue esbriet. She is up at 2 tablets 3 times daily today. Not applying sunscreen and I reminded her to do that. No worsening in dyspnea. She is handling well the reduced dose off prednisone 10 mg a day started for IPF flare several weeks ago  - Chest pain: This is significantly improved. Chest x-ray was unremarkable other than baseline fibrosis. She believes that chest pain got worse after pulmonary function tests and was related to muscular skeletal pain. It was right parasternal and far away from the right surgical area of the lung biopsy. Anyway to significantly improved and nearly resolved.  - Dizziness and orthostasis: After stopping her antihypertensives and cutting down her Lasix this is significantly improved/resolved. Blood pressure still 408 systolic. She does not think Lasix is helping her anymore   Review of Systems  Constitutional: Negative for fever and unexpected weight change.  HENT: Negative for congestion, dental problem, ear pain, nosebleeds, postnasal drip, rhinorrhea, sinus pressure, sneezing, sore throat  and trouble swallowing.   Eyes: Negative for redness and itching.  Respiratory: Positive for cough, chest tightness and shortness of breath. Negative for wheezing.   Cardiovascular: Negative for palpitations and leg swelling.  Gastrointestinal: Negative for nausea and vomiting.  Genitourinary: Negative for dysuria.  Musculoskeletal: Negative for joint swelling.  Skin: Negative for rash.  Neurological: Negative for  headaches.  Hematological: Does not bruise/bleed easily.  Psychiatric/Behavioral: Negative for dysphoric mood. The patient is not nervous/anxious.        Objective:   Physical Exam  Filed Vitals:   05/03/15 1528  BP: 102/58  Pulse: 103  Temp: 98.5 F (36.9 C)  TempSrc: Oral  Height: 4\' 11"  (1.499 m)  Weight: 184 lb 6.4 oz (83.643 kg)  SpO2: 95%   Discussion only visit Focal exam - Basal crack;les, looks better and stronger less weak, Rt sided chest scar better Euvolemic exam Looks less tired      Assessment & Plan:   #chest pain right side - improved - Likely related to muscle following breathing test earlier in the week - Even though it is on the side of the lung biopsy I don't think it's related to the biopsy - Chest x-ray is unchanged - Glad it is improved - Continue to monitor   ICD-9-CM ICD-10-CM   1. Right-sided chest pain 786.50 R07.9   2. Dizziness 780.4 R42   3. IPF (idiopathic pulmonary fibrosis) 516.31 J84.112   4. Chronic respiratory failure with hypoxia 518.83 J96.11    799.02       #Dizziness  - improved/resolved  - Most likely  related to lower blood pressure that could be related to diuresis and antihypertensives - cut lasix down to 20mg  every other day for the next 1-2 weeks and then stop - Avoid  Amlodipine and HCTZ completely  #IPF with chronic resp failure  - disease is moderate-severe; unchanged  - continue o2 , 3L  - continue esbriet slow titration upwards; glad you've gone up to 2 tablets 3 times daily - Ensure unscreen all times  - keep up derm  appt for Rt elbow nodule - drop prednisone down to 5mg  per day x 1 week, then 5mg  every other day x 1 week and then stop (was given for IPF Flare)  #Followup  -LFT blood test in 3 weeks -Rreturn to review in 3 weeks with me or NP:   - review  tolerance of esbriet, and ability to wean off lasix and steroids   (> 50% of this 15 min visit spent in face to face counseling or/and coordination  of care)   Dr. Brand Males, M.D., [facility].C.P Pulmonary and Critical Care Medicine Staff Physician [place] [facility] Pager: [phone], If no answer or between  15:00h - 7:00h: call [phone]  05/03/2015 3:50 PM

## 2015-05-05 ENCOUNTER — Inpatient Hospital Stay (HOSPITAL_COMMUNITY)
Admission: EM | Admit: 2015-05-05 | Discharge: 2015-05-20 | DRG: 196 | Disposition: A | Discharge status: Skilled Nursing Facility | Payer: 59 | Attending: Internal Medicine | Admitting: Internal Medicine

## 2015-05-05 ENCOUNTER — Encounter (HOSPITAL_COMMUNITY): Payer: Self-pay | Admitting: *Deleted

## 2015-05-05 ENCOUNTER — Emergency Department (HOSPITAL_COMMUNITY): Payer: 59

## 2015-05-05 DIAGNOSIS — Y95 Nosocomial condition: Secondary | ICD-10-CM | POA: Diagnosis present

## 2015-05-05 DIAGNOSIS — Z91048 Other nonmedicinal substance allergy status: Secondary | ICD-10-CM | POA: Diagnosis not present

## 2015-05-05 DIAGNOSIS — E1165 Type 2 diabetes mellitus with hyperglycemia: Secondary | ICD-10-CM | POA: Diagnosis present

## 2015-05-05 DIAGNOSIS — Z794 Long term (current) use of insulin: Secondary | ICD-10-CM

## 2015-05-05 DIAGNOSIS — Z9981 Dependence on supplemental oxygen: Secondary | ICD-10-CM

## 2015-05-05 DIAGNOSIS — J849 Interstitial pulmonary disease, unspecified: Secondary | ICD-10-CM | POA: Diagnosis not present

## 2015-05-05 DIAGNOSIS — E1142 Type 2 diabetes mellitus with diabetic polyneuropathy: Secondary | ICD-10-CM | POA: Diagnosis present

## 2015-05-05 DIAGNOSIS — D638 Anemia in other chronic diseases classified elsewhere: Secondary | ICD-10-CM | POA: Diagnosis present

## 2015-05-05 DIAGNOSIS — Z881 Allergy status to other antibiotic agents status: Secondary | ICD-10-CM

## 2015-05-05 DIAGNOSIS — K760 Fatty (change of) liver, not elsewhere classified: Secondary | ICD-10-CM | POA: Diagnosis present

## 2015-05-05 DIAGNOSIS — J84112 Idiopathic pulmonary fibrosis: Principal | ICD-10-CM | POA: Diagnosis present

## 2015-05-05 DIAGNOSIS — R599 Enlarged lymph nodes, unspecified: Secondary | ICD-10-CM | POA: Diagnosis present

## 2015-05-05 DIAGNOSIS — Z6835 Body mass index (BMI) 35.0-35.9, adult: Secondary | ICD-10-CM | POA: Diagnosis not present

## 2015-05-05 DIAGNOSIS — I5032 Chronic diastolic (congestive) heart failure: Secondary | ICD-10-CM | POA: Diagnosis present

## 2015-05-05 DIAGNOSIS — K219 Gastro-esophageal reflux disease without esophagitis: Secondary | ICD-10-CM | POA: Diagnosis present

## 2015-05-05 DIAGNOSIS — J189 Pneumonia, unspecified organism: Secondary | ICD-10-CM | POA: Diagnosis present

## 2015-05-05 DIAGNOSIS — J9621 Acute and chronic respiratory failure with hypoxia: Secondary | ICD-10-CM | POA: Diagnosis present

## 2015-05-05 DIAGNOSIS — E039 Hypothyroidism, unspecified: Secondary | ICD-10-CM | POA: Diagnosis present

## 2015-05-05 DIAGNOSIS — M797 Fibromyalgia: Secondary | ICD-10-CM | POA: Diagnosis present

## 2015-05-05 DIAGNOSIS — Z825 Family history of asthma and other chronic lower respiratory diseases: Secondary | ICD-10-CM

## 2015-05-05 DIAGNOSIS — I10 Essential (primary) hypertension: Secondary | ICD-10-CM | POA: Diagnosis present

## 2015-05-05 DIAGNOSIS — Z809 Family history of malignant neoplasm, unspecified: Secondary | ICD-10-CM | POA: Diagnosis not present

## 2015-05-05 DIAGNOSIS — T380X5A Adverse effect of glucocorticoids and synthetic analogues, initial encounter: Secondary | ICD-10-CM | POA: Diagnosis not present

## 2015-05-05 DIAGNOSIS — Z7952 Long term (current) use of systemic steroids: Secondary | ICD-10-CM | POA: Diagnosis not present

## 2015-05-05 DIAGNOSIS — R0602 Shortness of breath: Secondary | ICD-10-CM | POA: Diagnosis present

## 2015-05-05 DIAGNOSIS — J9620 Acute and chronic respiratory failure, unspecified whether with hypoxia or hypercapnia: Secondary | ICD-10-CM

## 2015-05-05 DIAGNOSIS — Z91010 Allergy to peanuts: Secondary | ICD-10-CM | POA: Diagnosis not present

## 2015-05-05 DIAGNOSIS — J9601 Acute respiratory failure with hypoxia: Secondary | ICD-10-CM | POA: Diagnosis not present

## 2015-05-05 DIAGNOSIS — J9690 Respiratory failure, unspecified, unspecified whether with hypoxia or hypercapnia: Secondary | ICD-10-CM

## 2015-05-05 DIAGNOSIS — Z91040 Latex allergy status: Secondary | ICD-10-CM

## 2015-05-05 DIAGNOSIS — G4733 Obstructive sleep apnea (adult) (pediatric): Secondary | ICD-10-CM | POA: Diagnosis present

## 2015-05-05 DIAGNOSIS — F419 Anxiety disorder, unspecified: Secondary | ICD-10-CM | POA: Diagnosis present

## 2015-05-05 DIAGNOSIS — J8410 Pulmonary fibrosis, unspecified: Secondary | ICD-10-CM

## 2015-05-05 DIAGNOSIS — F329 Major depressive disorder, single episode, unspecified: Secondary | ICD-10-CM | POA: Diagnosis present

## 2015-05-05 DIAGNOSIS — R0902 Hypoxemia: Secondary | ICD-10-CM

## 2015-05-05 DIAGNOSIS — Z79891 Long term (current) use of opiate analgesic: Secondary | ICD-10-CM | POA: Diagnosis not present

## 2015-05-05 LAB — CBC
HCT: 29.9 % — ABNORMAL LOW (ref 36.0–46.0)
HEMATOCRIT: 37.1 % (ref 36.0–46.0)
HEMOGLOBIN: 11.8 g/dL — AB (ref 12.0–15.0)
Hemoglobin: 9.4 g/dL — ABNORMAL LOW (ref 12.0–15.0)
MCH: 27.6 pg (ref 26.0–34.0)
MCH: 27.2 pg (ref 26.0–34.0)
MCHC: 31.8 g/dL (ref 30.0–36.0)
MCHC: 31.4 g/dL (ref 30.0–36.0)
MCV: 86.7 fL (ref 78.0–100.0)
MCV: 86.4 fL (ref 78.0–100.0)
Platelets: 277 10*3/uL (ref 150–400)
Platelets: 244 10*3/uL (ref 150–400)
RBC: 4.28 MIL/uL (ref 3.87–5.11)
RBC: 3.46 MIL/uL — ABNORMAL LOW (ref 3.87–5.11)
RDW: 15 % (ref 11.5–15.5)
RDW: 15.1 % (ref 11.5–15.5)
WBC: 17.2 10*3/uL — ABNORMAL HIGH (ref 4.0–10.5)
WBC: 12.7 10*3/uL — ABNORMAL HIGH (ref 4.0–10.5)

## 2015-05-05 LAB — URINALYSIS, ROUTINE W REFLEX MICROSCOPIC
Bilirubin Urine: NEGATIVE
GLUCOSE, UA: NEGATIVE mg/dL
HGB URINE DIPSTICK: NEGATIVE
Ketones, ur: NEGATIVE mg/dL
LEUKOCYTES UA: NEGATIVE
NITRITE: NEGATIVE
PROTEIN: NEGATIVE mg/dL
SPECIFIC GRAVITY, URINE: 1.018 (ref 1.005–1.030)
UROBILINOGEN UA: 0.2 mg/dL (ref 0.0–1.0)
pH: 5.5 (ref 5.0–8.0)

## 2015-05-05 LAB — BASIC METABOLIC PANEL
ANION GAP: 11 (ref 5–15)
BUN: 19 mg/dL (ref 6–20)
CALCIUM: 9.1 mg/dL (ref 8.9–10.3)
CHLORIDE: 103 mmol/L (ref 101–111)
CO2: 25 mmol/L (ref 22–32)
Creatinine, Ser: 1.08 mg/dL — ABNORMAL HIGH (ref 0.44–1.00)
GFR calc Af Amer: >60 mL/min (ref >60)
GFR, EST NON AFRICAN AMERICAN: 57 mL/min — AB (ref >60)
GLUCOSE: 75 mg/dL (ref 65–99)
Potassium: 3.9 mmol/L (ref 3.5–5.1)
Sodium: 139 mmol/L (ref 135–145)

## 2015-05-05 LAB — CREATININE, SERUM
Creatinine, Ser: 1.1 mg/dL — ABNORMAL HIGH (ref 0.44–1.00)
GFR calc Af Amer: >60 mL/min (ref >60)
GFR, EST NON AFRICAN AMERICAN: 56 mL/min — AB (ref >60)

## 2015-05-05 LAB — GLUCOSE, CAPILLARY
GLUCOSE-CAPILLARY: 177 mg/dL — AB (ref 65–99)
GLUCOSE-CAPILLARY: 169 mg/dL — AB (ref 65–99)
Glucose-Capillary: 162 mg/dL — ABNORMAL HIGH (ref 65–99)

## 2015-05-05 LAB — I-STAT CG4 LACTIC ACID, ED
LACTIC ACID, VENOUS: 2.07 mmol/L — AB (ref 0.5–2.0)
Lactic Acid, Venous: 2.31 mmol/L (ref 0.5–2.0)

## 2015-05-05 LAB — I-STAT TROPONIN, ED: TROPONIN I, POC: 0 ng/mL (ref 0.00–0.08)

## 2015-05-05 MED ORDER — CETYLPYRIDINIUM CHLORIDE 0.05 % MT LIQD
7.0000 mL | Freq: Two times a day (BID) | OROMUCOSAL | Status: DC
  Administered 2015-05-05 – 2015-05-20 (×28): 7 mL via OROMUCOSAL

## 2015-05-05 MED ORDER — SODIUM CHLORIDE 0.9 % IV BOLUS (SEPSIS)
500.0000 mL | Freq: Once | INTRAVENOUS | Status: AC
  Administered 2015-05-05: 500 mL via INTRAVENOUS

## 2015-05-05 MED ORDER — CEFTAZIDIME 2 G IJ SOLR
2.0000 g | Freq: Two times a day (BID) | INTRAMUSCULAR | Status: DC
  Filled 2015-05-05: qty 2

## 2015-05-05 MED ORDER — INSULIN GLARGINE 100 UNIT/ML ~~LOC~~ SOLN
55.0000 [IU] | Freq: Every day | SUBCUTANEOUS | Status: DC
  Administered 2015-05-05: 55 [IU] via SUBCUTANEOUS
  Filled 2015-05-05 (×2): qty 0.55

## 2015-05-05 MED ORDER — ONDANSETRON HCL 4 MG/2ML IJ SOLN: 4.0000 mg | Freq: Four times a day (QID) | INTRAMUSCULAR | Status: DC | PRN

## 2015-05-05 MED ORDER — DULOXETINE HCL 60 MG PO CPEP
60.0000 mg | ORAL_CAPSULE | Freq: Every morning | ORAL | Status: DC
  Administered 2015-05-06 – 2015-05-20 (×15): 60 mg via ORAL
  Filled 2015-05-05 (×15): qty 1

## 2015-05-05 MED ORDER — GUAIFENESIN ER 600 MG PO TB12
600.0000 mg | ORAL_TABLET | Freq: Two times a day (BID) | ORAL | Status: DC | PRN
  Filled 2015-05-05: qty 1

## 2015-05-05 MED ORDER — DICYCLOMINE HCL 20 MG PO TABS
20.0000 mg | ORAL_TABLET | Freq: Three times a day (TID) | ORAL | Status: DC
  Administered 2015-05-05 – 2015-05-20 (×59): 20 mg via ORAL
  Filled 2015-05-05 (×62): qty 1

## 2015-05-05 MED ORDER — PIRFENIDONE 267 MG PO CAPS
534.0000 mg | ORAL_CAPSULE | Freq: Three times a day (TID) | ORAL | Status: DC
  Filled 2015-05-05 (×3): qty 2

## 2015-05-05 MED ORDER — INSULIN ASPART 100 UNIT/ML ~~LOC~~ SOLN
0.0000 [IU] | Freq: Three times a day (TID) | SUBCUTANEOUS | Status: DC
  Administered 2015-05-05: 4 [IU] via SUBCUTANEOUS
  Administered 2015-05-06 – 2015-05-07 (×3): 7 [IU] via SUBCUTANEOUS
  Administered 2015-05-07 (×2): 4 [IU] via SUBCUTANEOUS
  Administered 2015-05-08: 11 [IU] via SUBCUTANEOUS

## 2015-05-05 MED ORDER — ONDANSETRON HCL 4 MG PO TABS: 4.0000 mg | ORAL_TABLET | Freq: Four times a day (QID) | ORAL | Status: DC | PRN

## 2015-05-05 MED ORDER — OXYCODONE HCL 5 MG PO TABS: 5.0000 mg | ORAL_TABLET | ORAL | Status: DC | PRN

## 2015-05-05 MED ORDER — SODIUM CHLORIDE 0.9 % IV SOLN
INTRAVENOUS | Status: AC
  Administered 2015-05-05: 12:00:00 via INTRAVENOUS

## 2015-05-05 MED ORDER — VITAMIN E 180 MG (400 UNIT) PO CAPS
400.0000 [IU] | ORAL_CAPSULE | Freq: Two times a day (BID) | ORAL | Status: DC
  Administered 2015-05-05 – 2015-05-20 (×30): 400 [IU] via ORAL
  Filled 2015-05-05 (×32): qty 1

## 2015-05-05 MED ORDER — ACETAMINOPHEN 325 MG PO TABS
650.0000 mg | ORAL_TABLET | Freq: Once | ORAL | Status: AC
  Administered 2015-05-05: 650 mg via ORAL
  Filled 2015-05-05: qty 2

## 2015-05-05 MED ORDER — PIRFENIDONE 267 MG PO CAPS
534.0000 mg | ORAL_CAPSULE | Freq: Three times a day (TID) | ORAL | Status: DC
Start: 2015-05-05 — End: 2015-05-09
  Administered 2015-05-05 – 2015-05-07 (×8): 534 mg via ORAL
  Administered 2015-05-08: 534 via ORAL
  Administered 2015-05-08 (×2): 534 mg via ORAL
  Filled 2015-05-05 (×3): qty 2

## 2015-05-05 MED ORDER — IPRATROPIUM-ALBUTEROL 0.5-2.5 (3) MG/3ML IN SOLN
3.0000 mL | Freq: Three times a day (TID) | RESPIRATORY_TRACT | Status: DC
  Administered 2015-05-05 – 2015-05-12 (×21): 3 mL via RESPIRATORY_TRACT
  Filled 2015-05-05 (×22): qty 3

## 2015-05-05 MED ORDER — MECLIZINE HCL 25 MG PO TABS
25.0000 mg | ORAL_TABLET | Freq: Three times a day (TID) | ORAL | Status: DC | PRN
  Filled 2015-05-05: qty 1

## 2015-05-05 MED ORDER — LEVOTHYROXINE SODIUM 100 MCG PO TABS
200.0000 ug | ORAL_TABLET | Freq: Every day | ORAL | Status: DC
  Administered 2015-05-06 – 2015-05-20 (×15): 200 ug via ORAL
  Filled 2015-05-05 (×3): qty 1
  Filled 2015-05-05: qty 2
  Filled 2015-05-05: qty 1
  Filled 2015-05-05 (×3): qty 2
  Filled 2015-05-05: qty 1
  Filled 2015-05-05: qty 2
  Filled 2015-05-05 (×4): qty 1
  Filled 2015-05-05: qty 2
  Filled 2015-05-05: qty 1

## 2015-05-05 MED ORDER — DEXTROSE 5 % IV SOLN
2.0000 g | Freq: Two times a day (BID) | INTRAVENOUS | Status: AC
  Administered 2015-05-05: 2 g via INTRAVENOUS
  Filled 2015-05-05: qty 2

## 2015-05-05 MED ORDER — VANCOMYCIN HCL IN DEXTROSE 750-5 MG/150ML-% IV SOLN
750.0000 mg | Freq: Two times a day (BID) | INTRAVENOUS | Status: DC
  Administered 2015-05-05 – 2015-05-07 (×4): 750 mg via INTRAVENOUS
  Filled 2015-05-05 (×6): qty 150

## 2015-05-05 MED ORDER — IPRATROPIUM BROMIDE 0.02 % IN SOLN
0.5000 mg | Freq: Four times a day (QID) | RESPIRATORY_TRACT | Status: DC
  Administered 2015-05-05: 0.5 mg via RESPIRATORY_TRACT
  Filled 2015-05-05: qty 2.5

## 2015-05-05 MED ORDER — ALPRAZOLAM 0.5 MG PO TABS
0.5000 mg | ORAL_TABLET | Freq: Every day | ORAL | Status: DC | PRN
  Administered 2015-05-06 – 2015-05-16 (×4): 0.5 mg via ORAL
  Filled 2015-05-05 (×4): qty 1

## 2015-05-05 MED ORDER — DEXTROSE 5 % IV SOLN
1.0000 g | Freq: Three times a day (TID) | INTRAVENOUS | Status: DC
  Administered 2015-05-05 – 2015-05-07 (×5): 1 g via INTRAVENOUS
  Filled 2015-05-05 (×8): qty 1

## 2015-05-05 MED ORDER — PANTOPRAZOLE SODIUM 40 MG PO TBEC
40.0000 mg | DELAYED_RELEASE_TABLET | Freq: Every day | ORAL | Status: DC
Start: 2015-05-06 — End: 2015-05-20
  Administered 2015-05-06 – 2015-05-20 (×15): 40 mg via ORAL
  Filled 2015-05-05 (×15): qty 1

## 2015-05-05 MED ORDER — ALBUTEROL SULFATE (2.5 MG/3ML) 0.083% IN NEBU
2.5000 mg | INHALATION_SOLUTION | RESPIRATORY_TRACT | Status: DC | PRN
  Administered 2015-05-06 – 2015-05-07 (×2): 2.5 mg via RESPIRATORY_TRACT
  Filled 2015-05-05 (×2): qty 3

## 2015-05-05 MED ORDER — PREDNISONE 20 MG PO TABS
40.0000 mg | ORAL_TABLET | Freq: Every day | ORAL | Status: DC
  Filled 2015-05-05 (×3): qty 2

## 2015-05-05 MED ORDER — DICYCLOMINE HCL 20 MG PO TABS
20.0000 mg | ORAL_TABLET | Freq: Four times a day (QID) | ORAL | Status: DC
  Administered 2015-05-05: 20 mg via ORAL
  Filled 2015-05-05 (×4): qty 1

## 2015-05-05 MED ORDER — ALBUTEROL SULFATE (2.5 MG/3ML) 0.083% IN NEBU
2.5000 mg | INHALATION_SOLUTION | Freq: Four times a day (QID) | RESPIRATORY_TRACT | Status: DC
  Administered 2015-05-05: 2.5 mg via RESPIRATORY_TRACT
  Filled 2015-05-05: qty 3

## 2015-05-05 MED ORDER — TRAZODONE HCL 50 MG PO TABS
75.0000 mg | ORAL_TABLET | Freq: Every day | ORAL | Status: DC
  Administered 2015-05-05 – 2015-05-19 (×15): 75 mg via ORAL
  Filled 2015-05-05 (×3): qty 1
  Filled 2015-05-05 (×2): qty 2
  Filled 2015-05-05: qty 1
  Filled 2015-05-05: qty 2
  Filled 2015-05-05 (×2): qty 1
  Filled 2015-05-05 (×2): qty 2
  Filled 2015-05-05: qty 1
  Filled 2015-05-05: qty 2
  Filled 2015-05-05: qty 1
  Filled 2015-05-05: qty 2
  Filled 2015-05-05 (×2): qty 1

## 2015-05-05 MED ORDER — TOPIRAMATE 25 MG PO TABS
25.0000 mg | ORAL_TABLET | Freq: Two times a day (BID) | ORAL | Status: DC
  Administered 2015-05-05 – 2015-05-20 (×30): 25 mg via ORAL
  Filled 2015-05-05 (×32): qty 1

## 2015-05-05 MED ORDER — ENOXAPARIN SODIUM 40 MG/0.4ML ~~LOC~~ SOLN
40.0000 mg | SUBCUTANEOUS | Status: DC
  Administered 2015-05-05 – 2015-05-08 (×4): 40 mg via SUBCUTANEOUS
  Filled 2015-05-05 (×5): qty 0.4

## 2015-05-05 MED ORDER — VANCOMYCIN HCL IN DEXTROSE 1-5 GM/200ML-% IV SOLN
1000.0000 mg | Freq: Once | INTRAVENOUS | Status: AC
  Administered 2015-05-05: 1000 mg via INTRAVENOUS
  Filled 2015-05-05: qty 200

## 2015-05-05 NOTE — Progress Notes (Signed)
05/05/2015 2:11 PM  Took patients home medications to pharmacy to be dispensed. Counted 203 capsules. Per Pharmacy the staff will need to sign the medication each time the patient needs the dose due to the high price of the medication. Will inform the attending RN and oncoming shift.   [employer], RN-BC, [employer] [facility] [address] Phone [phone]

## 2015-05-05 NOTE — Progress Notes (Signed)
05/05/2015 11:59 AM  Attempted Report. Hayley RN will call back when available.   [employer], RN-BC, [employer] [facility] [address] Phone [phone]

## 2015-05-05 NOTE — ED Notes (Signed)
MD Lamas aware of pt BP, no new orders received, ok to send to 6E per MD.  Pt a x 4.

## 2015-05-05 NOTE — ED Notes (Signed)
MD at bedside. 

## 2015-05-05 NOTE — ED Notes (Signed)
Pt presents via POV c/o shortness of breath and fever this AM.  Pt reports fever of 100.3 this morning.  Pt has pulmonary fibrosis on home O2.  Pt denies N/V, dysuria, hematuria.  Pt reports a productive cough beginning this AM.  Pt a x 4, breathing labored on arrival, O2 93% 4L.

## 2015-05-05 NOTE — Progress Notes (Signed)
Admission note:   Arrival Method: Via stretcher from ED. Mental Status: A&OX4. Telemetry: Placed on box #24.   Skin: Intact.  Tubes: N/A. IV: LAC NS@100  mls/hr. Pain: Denies.  Family: Husband at bedside. Living Situation: From home with husband. Safety Measures: Call bell within reach, instructed to call for assistance. 6E Orientation: Oriented to unit and surroundings.  Full assessment in doc flow sheets.   Cerys Winget, BSN, RN-BC.

## 2015-05-05 NOTE — ED Notes (Signed)
Lactic results given to MD [facility]

## 2015-05-05 NOTE — ED Provider Notes (Addendum)
CSN: [id number]     Arrival date & time 05/05/15  0900 History   First MD Initiated Contact with Patient 05/05/15 [phone]     Chief Complaint  Patient presents with  . Shortness of Breath     (Consider location/radiation/quality/duration/timing/severity/associated sxs/prior Treatment) HPI Comments: Patient with an extensive medical history of interstitial pulmonary fibrosis currently on Esbriet for the last 1 week and prednisone who wears 2 L of oxygen chronically and today woke up with fever of 100.3 and cough with yellow sputum.  During her last hospitalization approximate 10 days ago it was felt that she could have a component of fluid overload and Lasix was started and increased. 4 days prior to arrival to the emergency room Lasix was decreased because when patient followed up with Dr. Chase Caller she was lightheaded upon standing with low blood pressure. Since that time she has felt much better and denies any recent swelling in her legs or weight gain. Also during her office follow-up she was having severe right-sided chest pain which she states now has almost completely resolved.  Patient is a 54 y.o. female presenting with shortness of breath. The history is provided by the patient and the spouse.  Shortness of Breath Severity:  Severe Onset quality:  Gradual Duration:  1 day Timing:  Constant Progression:  Worsening Chronicity:  Recurrent Context comment:  This morning patient woke up with a fever, cough with new productive yellow sputum and worsening shortness of breath requiring her to turn her oxygen up from 2-4. Relieved by:  Nothing Worsened by:  Activity Ineffective treatments:  Oxygen Associated symptoms: cough, fever and sputum production   Associated symptoms: no abdominal pain, no hemoptysis, no sore throat, no vomiting and no wheezing   Associated symptoms comment:  Patient was having severe right chest pain earlier this week which is much improved. Risk factors: no hx of  PE/DVT   Risk factors comment:  Severe IPF on 2 L of oxygen chronically   Past Medical History  Diagnosis Date  . Asthma   . Diabetes mellitus   . Headache(784.0)     MIGRAINES  . Fibromyalgia   . GERD (gastroesophageal reflux disease)   . IBS (irritable bowel syndrome)   . Renal mass, right   . IC (interstitial cystitis)   . Frequency of urination   . Anemia   . Swelling of both lower extremities     TAKES MAXIDE FOR SWELLING (DENIES HBP)  . Hypothyroidism   . Depression   . Cancer     kidney  . Hypertension   . Wears glasses   . Sleep apnea     uses a cpap  . Pulmonary fibrosis   . Pneumonia   . Fatty liver   . Oxygen dependent     2 Liters at rest, 3 liters with activity   Past Surgical History  Procedure Laterality Date  . Cesarean section  1983, 1985     x 2  . Bladder tack    . Cystocopy    . Cystoscopy    . Robotic assited partial nephrectomy Right 12/14/2013    Procedure: ROBOTIC ASSITED PARTIAL NEPHRECTOMY;  Surgeon: Dutch Gray, MD;  Location: [facility];  Service: Urology;  Laterality: Right;  . Cystoscopy w/ ureteral stent placement Right 12/14/2013    Procedure: CYSTOSCOPY WITH RETROGRADE PYELOGRAM/URETERAL STENT PLACEMENT;  Surgeon: Dutch Gray, MD;  Location: [facility];  Service: Urology;  Laterality: Right;  . Cervical laminectomy  07/2012    anterior with  plates and screws.  . Abdominal hysterectomy  1996  . Cholecystectomy  1995    tubal ligation also  . Posterior cervical laminectomy  2004  . Tubal ligation    . Colonoscopy    . Incisional hernia repair N/A 09/12/2014    Procedure: HERNIA REPAIR INCISIONAL;  Surgeon: Coralie Keens, MD;  Location: [place];  Service: General;  Laterality: N/A;  . Insertion of mesh N/A 09/12/2014    Procedure: INSERTION OF MESH;  Surgeon: Coralie Keens, MD;  Location: [place];  Service: General;  Laterality: N/A;  . Video assisted thoracoscopy Right 03/21/2015    Procedure: VIDEO  ASSISTED THORACOSCOPY;  Surgeon: Gaye Pollack, MD;  Location: [facility];  Service: Thoracic;  Laterality: Right;  . Lung biopsy Right 03/21/2015    Procedure: Right Upper and Lower Lobe Lung Wedge BIOPSY;  Surgeon: Gaye Pollack, MD;  Location: [facility];  Service: Thoracic;  Laterality: Right;   Family History  Problem Relation Age of Onset  . Cancer Mother     breast, lung  . COPD Father   . Hypertension Father   . Hyperlipidemia Father   . Stroke Father    History  Substance Use Topics  . Smoking status: Never Smoker   . Smokeless tobacco: Never Used  . Alcohol Use: Yes     Comment: occasional   OB History    No data available     Review of Systems  Constitutional: Positive for fever.  HENT: Negative for sore throat.   Respiratory: Positive for cough, sputum production and shortness of breath. Negative for hemoptysis and wheezing.   Gastrointestinal: Negative for vomiting and abdominal pain.  All other systems reviewed and are negative.     Allergies  Peanut-containing drug products; Sulfa antibiotics; Betadine; and Latex  Home Medications   Prior to Admission medications   Medication Sig Start Date End Date Taking? Authorizing Provider  albuterol (PROVENTIL HFA;VENTOLIN HFA) 108 (90 BASE) MCG/ACT inhaler Inhale 2 puffs into the lungs every 6 (six) hours as needed. For shortness of breath    Historical Provider, MD  ALPRAZolam Duanne Moron) 0.5 MG tablet Take 0.5 mg by mouth daily as needed for anxiety.     Historical Provider, MD  cholecalciferol (VITAMIN D) 1000 UNITS tablet Take 1,000 Units by mouth daily.    Historical Provider, MD  dicyclomine (BENTYL) 20 MG tablet Take 20 mg by mouth every 6 (six) hours.    Historical Provider, MD  DULoxetine (CYMBALTA) 60 MG capsule Take 60 mg by mouth daily.    Historical Provider, MD  esomeprazole (NEXIUM) 40 MG capsule Take 40 mg by mouth 2 (two) times daily before a meal.     Historical Provider, MD  estrogens, conjugated, (PREMARIN)  0.625 MG tablet Take 0.625 mg by mouth daily.     Historical Provider, MD  Fluticasone-Salmeterol (ADVAIR) 100-50 MCG/DOSE AEPB Inhale 1 puff into the lungs as needed (shortness of breath).     Historical Provider, MD  furosemide (LASIX) 40 MG tablet Take 1 tablet (40 mg total) by mouth daily. Patient taking differently: Take 20 mg by mouth daily.  04/25/15   Nishant Dhungel, MD  guaiFENesin (MUCINEX) 600 MG 12 hr tablet Take 1 tablet (600 mg total) by mouth 2 (two) times daily. Patient taking differently: Take 600 mg by mouth 2 (two) times daily as needed for cough or to loosen phlegm.  03/25/15   Erin R Barrett, PA-C  insulin aspart (NOVOLOG) 100 UNIT/ML  injection Inject 0-15 Units into the skin 3 (three) times daily with meals. Sliding scale with meals Patient taking differently: Inject 15 Units into the skin 3 (three) times daily with meals. Sliding scale with meals 04/02/15   Domenic Polite, MD  insulin glargine (LANTUS) 100 UNIT/ML injection Inject 0.55 mLs (55 Units total) into the skin at bedtime. 04/02/15   Domenic Polite, MD  levothyroxine (SYNTHROID, LEVOTHROID) 200 MCG tablet Take 200 mcg by mouth daily before breakfast.    Historical Provider, MD  meclizine (ANTIVERT) 25 MG tablet Take 25 mg by mouth 3 (three) times daily as needed for dizziness.    Historical Provider, MD  metFORMIN (GLUCOPHAGE-XR) 500 MG 24 hr tablet Take 1,000 mg by mouth 2 (two) times daily.    Historical Provider, MD  ondansetron (ZOFRAN) 4 MG tablet Take 4 mg by mouth every 8 (eight) hours as needed for nausea or vomiting.    Historical Provider, MD  oxyCODONE (OXY IR/ROXICODONE) 5 MG immediate release tablet Take 1-2 tablets (5-10 mg total) by mouth every 4 (four) hours as needed for severe pain. 04/19/15   Gaye Pollack, MD  Pirfenidone (ESBRIET) 267 MG CAPS Days 1-7: 1 cap three times daily.  Days 8-14: 2 caps three times daily, Days 15 onward: 3 caps three times daily. 04/09/15   Tammy S Parrett, NP  predniSONE  (DELTASONE) 10 MG tablet Take 40 mg daily for 3 days, then 30 mg daily for next 3 days, then 20 mg daily for next 3 days and then 10 daily 04/24/15   Nishant Dhungel, MD  topiramate (TOPAMAX) 25 MG tablet Take 25 mg by mouth 2 (two) times daily.    Historical Provider, MD  traZODone (DESYREL) 150 MG tablet Take 75 mg by mouth at bedtime.    Historical Provider, MD  vitamin E 400 UNIT capsule Take 800 Units by mouth daily.    Historical Provider, MD   BP 120/67 mmHg  Pulse 107  Temp(Src) 100.6 F (38.1 C) (Oral)  Resp 23  Ht 4\' 11"  (1.499 m)  Wt 181 lb (82.101 kg)  BMI 36.54 kg/m2  SpO2 94% Physical Exam  Constitutional: She is oriented to person, place, and time. She appears well-developed and well-nourished. No distress.  HENT:  Head: Normocephalic and atraumatic.  Mouth/Throat: Oropharynx is clear and moist. Mucous membranes are dry.  Eyes: Conjunctivae and EOM are normal. Pupils are equal, round, and reactive to light.  Neck: Normal range of motion. Neck supple.  Cardiovascular: Regular rhythm and intact distal pulses.  Tachycardia present.   No murmur heard. Pulmonary/Chest: Effort normal and breath sounds normal. No respiratory distress. She has no wheezes. She exhibits tenderness.  Following crackles heard in bilateral upper lobes. Coarse crackles heard at bilateral lower lobes.  Diffuse anterior chest wall tenderness to palpation  Abdominal: Soft. She exhibits no distension. There is no tenderness. There is no rebound and no guarding.  Musculoskeletal: Normal range of motion. She exhibits no edema or tenderness.  Neurological: She is alert and oriented to person, place, and time.  Skin: Skin is warm and dry. No rash noted. No erythema.  Psychiatric: She has a normal mood and affect. Her behavior is normal.  Nursing note and vitals reviewed.   ED Course  Procedures (including critical care time) Labs Review Labs Reviewed  BASIC METABOLIC PANEL - Abnormal; Notable for the  following:    Creatinine, Ser 1.08 (*)    GFR calc non Af Amer 57 (*)  All other components within normal limits  CBC - Abnormal; Notable for the following:    WBC 17.2 (*)    Hemoglobin 11.8 (*)    All other components within normal limits  I-STAT CG4 LACTIC ACID, ED - Abnormal; Notable for the following:    Lactic Acid, Venous 2.07 (*)    All other components within normal limits  CULTURE, BLOOD (ROUTINE X 2)  CULTURE, BLOOD (ROUTINE X 2)  URINE CULTURE  URINALYSIS, ROUTINE W REFLEX MICROSCOPIC (NOT AT [facility])  I-STAT TROPOININ, ED    Imaging Review Dg Chest 2 View (if Patient Has Fever And/or Copd)  05/05/2015   CLINICAL DATA:  Shortness of breath, wheezing, fever. History of pulmonary fibrosis.  EXAM: CHEST  2 VIEW  COMPARISON:  04/30/2015  FINDINGS: Cardiomediastinal silhouette is unchanged, with the cardiac silhouette being within normal limits for size. Lung volumes remain diminished with similar appearance of chronic interstitial lung disease. No definite acute airspace consolidation, overt edema, pleural effusion, or pneumothorax is identified. Upper abdominal surgical clips are noted. No acute osseous abnormality is identified. Prior lower cervical ACDF.  IMPRESSION: Unchanged appearance of the chest. Chronic interstitial lung disease consistent with history of fibrosis.   Electronically Signed   By: Logan Bores   On: 05/05/2015 10:22     EKG Interpretation   Date/Time:  Sunday May 05 2015 09:10:07 EDT Ventricular Rate:  110 PR Interval:  110 QRS Duration: 76 QT Interval:  304 QTC Calculation: 411 R Axis:   123 Text Interpretation:  Sinus tachycardia Ventricular premature complex  Aberrant complex Right axis deviation Abnormal R-wave progression, late  transition No significant change since last tracing Confirmed by Maryan Rued   MD, Loree Fee ([id number]) on 05/05/2015 9:41:32 AM      MDM   Final diagnoses:  Healthcare-associated pneumonia    Patient with a significant  history of IPF currently on Esbriet with multiple recent hospitalizations and close follow-up by her pulmonologist presents today with one day of fever and new productive cough.  Patient is complaining of shortness of breath worse with exertion and having to increase her home oxygen from 2-4. Patient denies nausea, vomiting, diarrhea, abdominal pain. She was having right-sided chest pain earlier this week which is almost completely resolved. She denies any distal edema despite cutting back her Lasix this week. Her pulmonologist decreased Lasix as patient had a low blood pressure and dizziness upon standing.  During her last hospitalization was thought that she had a combination of fluid overload and exacerbation of IPF. She still currently taking prednisone.  Last bout of pneumonia was in January.  Today patient is febrile at 100.6, tachycardic with normal blood pressure. Oxygen saturation is between 90 and 95% at rest on 4 L.   Crackles heard at the bilateral bases and mild faint crackles heard in the upper lobes of the lungs. Diffuse chest wall tenderness. No abdominal tenderness and no evidence of fluid overload today.  Given patient's known history, new fever, new productive cough concern for healthcare associated pneumonia.  Patient given a 500 mL fluid bolus given recent fluid overload status. Tylenol.  CBC, BMP, blood cultures, UA, lactic acid, troponin, chest x-ray pending. EKG shows sinus tachycardia.  11:22 AM Leukocytosis of 17,000, BMP within normal limits, lactic acid mildly elevated at 2 with a negative troponin. Chest x-ray without acute findings. However given new fever, productive cough and worsening shortness of breath we'll treat for healthcare associated pneumonia with Fortaz and vancomycin.  Blanchie Dessert, MD 05/05/15  [place], MD 05/05/15 1133

## 2015-05-05 NOTE — H&P (Signed)
PCP:   Velna Hatchet, MD   Chief Complaint:  Shortness of breath  HPI: 54 year old female who   has a past medical history of Asthma; Diabetes mellitus; Headache(784.0); Fibromyalgia; GERD (gastroesophageal reflux disease); IBS (irritable bowel syndrome); Renal mass, right; IC (interstitial cystitis); Frequency of urination; Anemia; Swelling of both lower extremities; Hypothyroidism; Depression; Cancer; Hypertension; Wears glasses; Sleep apnea; Pulmonary fibrosis; Pneumonia; Fatty liver; and Oxygen dependent. Patient was recently discharged from the hospital after she was treated for acute on chronic respiratory failure, patient has interstitial lung disease, and is on chronic prednisone therapy. Patient was recently seen by her pulmonologist who cut down the dose of Lasix as patient was complaining of dizziness. This morning patient felt more short of breath than usual also had chills. In the ED she was found to have fever of 100.6. Chest x-ray did not show significant abnormality. Patient also had white count of 17,000 and was found to be hypoxic. Patient started on vancomycin and Ceftazidime for healthcare associated pneumonia. She denies chest pain, has been coughing up phlegm. No nausea vomiting or diarrhea no abdominal pain. No dysuria urgency frequency of urination. Allergies:   Allergies  Allergen Reactions  . Peanut-Containing Drug Products Shortness Of Breath    Pecans, walnuts as well  . Sulfa Antibiotics Hives and Shortness Of Breath  . Betadine [Povidone Iodine] Rash  . Latex Rash      Past Medical History  Diagnosis Date  . Asthma   . Diabetes mellitus   . Headache(784.0)     MIGRAINES  . Fibromyalgia   . GERD (gastroesophageal reflux disease)   . IBS (irritable bowel syndrome)   . Renal mass, right   . IC (interstitial cystitis)   . Frequency of urination   . Anemia   . Swelling of both lower extremities     TAKES MAXIDE FOR SWELLING (DENIES HBP)  .  Hypothyroidism   . Depression   . Cancer     kidney  . Hypertension   . Wears glasses   . Sleep apnea     uses a cpap  . Pulmonary fibrosis   . Pneumonia   . Fatty liver   . Oxygen dependent     2 Liters at rest, 3 liters with activity    Past Surgical History  Procedure Laterality Date  . Cesarean section  1983, 1985     x 2  . Bladder tack    . Cystocopy    . Cystoscopy    . Robotic assited partial nephrectomy Right 12/14/2013    Procedure: ROBOTIC ASSITED PARTIAL NEPHRECTOMY;  Surgeon: Dutch Gray, MD;  Location: [facility];  Service: Urology;  Laterality: Right;  . Cystoscopy w/ ureteral stent placement Right 12/14/2013    Procedure: CYSTOSCOPY WITH RETROGRADE PYELOGRAM/URETERAL STENT PLACEMENT;  Surgeon: Dutch Gray, MD;  Location: [facility];  Service: Urology;  Laterality: Right;  . Cervical laminectomy  07/2012    anterior with plates and screws.  . Abdominal hysterectomy  1996  . Cholecystectomy  1995    tubal ligation also  . Posterior cervical laminectomy  2004  . Tubal ligation    . Colonoscopy    . Incisional hernia repair N/A 09/12/2014    Procedure: HERNIA REPAIR INCISIONAL;  Surgeon: Coralie Keens, MD;  Location: [place];  Service: General;  Laterality: N/A;  . Insertion of mesh N/A 09/12/2014    Procedure: INSERTION OF MESH;  Surgeon: Coralie Keens, MD;  Location: [place];  Service: General;  Laterality: N/A;  . Video assisted thoracoscopy Right 03/21/2015    Procedure: VIDEO ASSISTED THORACOSCOPY;  Surgeon: Gaye Pollack, MD;  Location: [facility] OR;  Service: Thoracic;  Laterality: Right;  . Lung biopsy Right 03/21/2015    Procedure: Right Upper and Lower Lobe Lung Wedge BIOPSY;  Surgeon: Gaye Pollack, MD;  Location: [facility] OR;  Service: Thoracic;  Laterality: Right;    Prior to Admission medications   Medication Sig Start Date End Date Taking? Authorizing Provider  albuterol (PROVENTIL HFA;VENTOLIN HFA) 108 (90 BASE) MCG/ACT inhaler  Inhale 2 puffs into the lungs every 6 (six) hours as needed. For shortness of breath   Yes Historical Provider, MD  cholecalciferol (VITAMIN D) 1000 UNITS tablet Take 1,000 Units by mouth every morning.    Yes Historical Provider, MD  dicyclomine (BENTYL) 20 MG tablet Take 20 mg by mouth every 6 (six) hours.   Yes Historical Provider, MD  DULoxetine (CYMBALTA) 60 MG capsule Take 60 mg by mouth every morning.    Yes Historical Provider, MD  esomeprazole (NEXIUM) 40 MG capsule Take 40 mg by mouth 2 (two) times daily before a meal.    Yes Historical Provider, MD  estrogens, conjugated, (PREMARIN) 0.625 MG tablet Take 0.625 mg by mouth every morning.    Yes Historical Provider, MD  Fluticasone-Salmeterol (ADVAIR) 100-50 MCG/DOSE AEPB Inhale 1 puff into the lungs as needed (shortness of breath).    Yes Historical Provider, MD  guaiFENesin (MUCINEX) 600 MG 12 hr tablet Take 1 tablet (600 mg total) by mouth 2 (two) times daily. Patient taking differently: Take 600 mg by mouth 2 (two) times daily as needed for cough or to loosen phlegm.  03/25/15  Yes Erin R Barrett, PA-C  insulin aspart (NOVOLOG) 100 UNIT/ML injection Inject 0-15 Units into the skin 3 (three) times daily with meals. Sliding scale with meals Patient taking differently: Inject 15 Units into the skin 3 (three) times daily with meals. Sliding scale with meals 04/02/15  Yes Domenic Polite, MD  insulin glargine (LANTUS) 100 UNIT/ML injection Inject 0.55 mLs (55 Units total) into the skin at bedtime. 04/02/15  Yes Domenic Polite, MD  levothyroxine (SYNTHROID, LEVOTHROID) 200 MCG tablet Take 200 mcg by mouth daily before breakfast.   Yes Historical Provider, MD  meclizine (ANTIVERT) 25 MG tablet Take 25 mg by mouth 3 (three) times daily as needed for dizziness.   Yes Historical Provider, MD  metFORMIN (GLUCOPHAGE-XR) 500 MG 24 hr tablet Take 1,000 mg by mouth 2 (two) times daily.   Yes Historical Provider, MD  ondansetron (ZOFRAN) 4 MG tablet Take 4 mg  by mouth every 8 (eight) hours as needed for nausea or vomiting.   Yes Historical Provider, MD  oxyCODONE (OXY IR/ROXICODONE) 5 MG immediate release tablet Take 1-2 tablets (5-10 mg total) by mouth every 4 (four) hours as needed for severe pain. 04/19/15  Yes Gaye Pollack, MD  Pirfenidone (ESBRIET) 267 MG CAPS Days 1-7: 1 cap three times daily.  Days 8-14: 2 caps three times daily, Days 15 onward: 3 caps three times daily. 04/09/15  Yes Tammy S Parrett, NP  predniSONE (DELTASONE) 10 MG tablet Take 40 mg daily for 3 days, then 30 mg daily for next 3 days, then 20 mg daily for next 3 days and then 10 daily Patient taking differently: Take 10 mg by mouth every other day. Take 40 mg daily for 3 days, then 30 mg daily for next 3 days, then 20 mg  daily for next 3 days and then 10 daily 04/24/15  Yes Nishant Dhungel, MD  topiramate (TOPAMAX) 25 MG tablet Take 25 mg by mouth 2 (two) times daily.   Yes Historical Provider, MD  traZODone (DESYREL) 150 MG tablet Take 75 mg by mouth at bedtime.   Yes Historical Provider, MD  vitamin E 400 UNIT capsule Take 400 Units by mouth 2 (two) times daily.    Yes Historical Provider, MD  ALPRAZolam Duanne Moron) 0.5 MG tablet Take 0.5 mg by mouth daily as needed for anxiety.     Historical Provider, MD  furosemide (LASIX) 40 MG tablet Take 1 tablet (40 mg total) by mouth daily. Patient not taking: Reported on 05/05/2015 04/25/15   Louellen Molder, MD    Social History:  reports that she has never smoked. She has never used smokeless tobacco. She reports that she drinks alcohol. She reports that she does not use illicit drugs.  Family History  Problem Relation Age of Onset  . Cancer Mother     breast, lung  . COPD Father   . Hypertension Father   . Hyperlipidemia Father   . Stroke Father     Danley Danker Weights   05/05/15 0915  Weight: 82.101 kg (181 lb)    All the positives are listed in BOLD  Review of Systems:  HEENT: Headache, blurred vision, runny nose, sore  throat Neck: Hypothyroidism, hyperthyroidism,,lymphadenopathy Chest : Shortness of breath, history of COPD, Asthma Heart : Chest pain, history of coronary arterey disease GI:  Nausea, vomiting, diarrhea, constipation, GERD GU: Dysuria, urgency, frequency of urination, hematuria Neuro: Stroke, seizures, syncope Psych: Depression, anxiety, hallucinations   Physical Exam: Blood pressure 114/68, pulse 110, temperature 99.9 F (37.7 C), temperature source Oral, resp. rate 37, height 4\' 11"  (1.499 m), weight 82.101 kg (181 lb), SpO2 96 %. Constitutional:   Patient is a well-developed and well-nourished female in no acute distress and cooperative with exam. Head: Normocephalic and atraumatic Mouth: Mucus membranes moist Eyes: PERRL, EOMI, conjunctivae normal Neck: Supple, No Thyromegaly Cardiovascular: RRR, S1 normal, S2 normal Pulmonary/Chest: Decreased breath sounds bilaterally, bibasilar crackles Abdominal: Soft. Non-tender, non-distended, bowel sounds are normal, no masses, organomegaly, or guarding present.  Neurological: A&O x3, Strength is normal and symmetric bilaterally, cranial nerve II-XII are grossly intact, no focal motor deficit, sensory intact to light touch bilaterally.  Extremities : No Cyanosis, Clubbing or Edema  Labs on Admission:  Basic Metabolic Panel:  Recent Labs Lab 05/05/15 0940  NA 139  K 3.9  CL 103  CO2 25  GLUCOSE 75  BUN 19  CREATININE 1.08*  CALCIUM 9.1   CBC:  Recent Labs Lab 05/05/15 0940  WBC 17.2*  HGB 11.8*  HCT 37.1  MCV 86.7  PLT 277   Cardiac Enzymes: No results for input(s): CKTOTAL, CKMB, CKMBINDEX, TROPONINI in the last 168 hours.  BNP (last 3 results)  Recent Labs  02/28/15 1152 03/28/15 2241 04/23/15 2153  BNP 66.7 17.3 77.3     Radiological Exams on Admission: Dg Chest 2 View (if Patient Has Fever And/or Copd)  05/05/2015   CLINICAL DATA:  Shortness of breath, wheezing, fever. History of pulmonary fibrosis.   EXAM: CHEST  2 VIEW  COMPARISON:  04/30/2015  FINDINGS: Cardiomediastinal silhouette is unchanged, with the cardiac silhouette being within normal limits for size. Lung volumes remain diminished with similar appearance of chronic interstitial lung disease. No definite acute airspace consolidation, overt edema, pleural effusion, or pneumothorax is identified. Upper abdominal surgical clips are  noted. No acute osseous abnormality is identified. Prior lower cervical ACDF.  IMPRESSION: Unchanged appearance of the chest. Chronic interstitial lung disease consistent with history of fibrosis.   Electronically Signed   By: Logan Bores   On: 05/05/2015 10:22    EKG: Independently reviewed. Sinus tachycardia   Assessment/Plan Active Problems:   HTN (hypertension)   ILD (interstitial lung disease)   Shortness of breath   HCAP (healthcare-associated pneumonia)  Acute on chronic respiratory failure Patient likely has exacerbation of her underlying interstitial lung disease versus developing healthcare associated pneumonia. Will continue patient on vancomycin and ceftazidime, she was taking prednisone 10 mg every other day. I will increase prednisone to 40 mg by mouth daily. Continue oxygen via nasal cannula. If patient does not improve by tomorrow consider primary consultation.  Diabetes mellitus Will hold metformin, and initiate  resistant sliding scale, with NovoLog  Lower extremity edema At this time patient does not have edema, will hold the Lasix. Patient has been started on gentle IV fluids  Hypothyroidism Continue Synthroid   Code status: Full code  Family discussion: Admission, patients condition and plan of care including tests being ordered have been discussed with the patient and *her husband at bedside* who indicate understanding and agree with the plan and Code Status.   Time Spent on Admission: 60 min  [facility] [facility] Pager: [phone] 05/05/2015, 12:15 PM  If  7PM-7AM, please contact night-coverage  [email or web address]  Password [email or web address]

## 2015-05-05 NOTE — Plan of Care (Signed)
Problem: Phase I Progression Outcomes Goal: Discharge plan established Outcome: Completed/Met Date Met:  05/05/15 Will d/c to home when stable.

## 2015-05-05 NOTE — Progress Notes (Signed)
05/05/2015 12:25 PM  Report received. Room is ready for patient   [employer], RN-BC, [facility] Phone [phone]

## 2015-05-05 NOTE — Progress Notes (Addendum)
ANTIBIOTIC CONSULT NOTE - INITIAL  Pharmacy Consult for vancomycin and ceftazidime Indication: pneumonia  Allergies  Allergen Reactions  . Peanut-Containing Drug Products Shortness Of Breath    Pecans, walnuts as well  . Sulfa Antibiotics Hives and Shortness Of Breath  . Betadine [Povidone Iodine] Rash  . Latex Rash    Patient Measurements: Height: 4\' 11"  (149.9 cm) Weight: 181 lb (82.101 kg) IBW/kg (Calculated) : 43.2 Adjusted Body Weight: 58kg  Vital Signs: Temp: 100.6 F (38.1 C) (06/26 0915) Temp Source: Oral (06/26 0915) BP: 99/57 mmHg (06/26 1033) Pulse Rate: 108 (06/26 1037) Intake/Output from previous day:   Intake/Output from this shift:    Labs:  Recent Labs  05/05/15 0940  WBC 17.2*  HGB 11.8*  PLT 277   Estimated Creatinine Clearance: 58 mL/min (by C-G formula based on Cr of 1.03). No results for input(s): VANCOTROUGH, VANCOPEAK, VANCORANDOM, GENTTROUGH, GENTPEAK, GENTRANDOM, TOBRATROUGH, TOBRAPEAK, TOBRARND, AMIKACINPEAK, AMIKACINTROU, AMIKACIN in the last 72 hours.   Microbiology: Recent Results (from the past 720 hour(s))  MRSA PCR Screening     Status: None   Collection Time: 04/24/15  3:56 AM  Result Value Ref Range Status   MRSA by PCR NEGATIVE NEGATIVE Final    Comment:        The GeneXpert MRSA Assay (FDA approved for NASAL specimens only), is one component of a comprehensive MRSA colonization surveillance program. It is not intended to diagnose MRSA infection nor to guide or monitor treatment for MRSA infections.   Blood Culture (routine x 2)     Status: None (Preliminary result)   Collection Time: 05/05/15  9:42 AM  Result Value Ref Range Status   Specimen Description BLOOD RIGHT ANTECUBITAL  Final   Special Requests BOTTLES DRAWN AEROBIC AND ANAEROBIC 5CC  Final   Culture PENDING  Incomplete   Report Status PENDING  Incomplete    Medical History: Past Medical History  Diagnosis Date  . Asthma   . Diabetes mellitus   .  Headache(784.0)     MIGRAINES  . Fibromyalgia   . GERD (gastroesophageal reflux disease)   . IBS (irritable bowel syndrome)   . Renal mass, right   . IC (interstitial cystitis)   . Frequency of urination   . Anemia   . Swelling of both lower extremities     TAKES MAXIDE FOR SWELLING (DENIES HBP)  . Hypothyroidism   . Depression   . Cancer     kidney  . Hypertension   . Wears glasses   . Sleep apnea     uses a cpap  . Pulmonary fibrosis   . Pneumonia   . Fatty liver   . Oxygen dependent     2 Liters at rest, 3 liters with activity    Assessment: 66 YOF who was recently admitted (discharged 04/25/2015) with acute on chronic respiratory failure. She was discharged on home inhalers, Lasix 40mg  PO daily was added, and she was started on pirfenidone for IPF (diagnosed via surgical lung biopsy). She had a follow-up appointment with Dr. Chase Caller on 6/21 at which time she had new onset chest pain and orthostasis- a CXR was completed and Lasix dose was decreased.   She presents today with SOB and fever with new productive cough. LA elevated at 2.07, WBC elevated at 17.2.  Last SCr from 6/15- 1.03, est CrCL using that value is ~55-95mL/min.  EDP ordered vancomycin 1g IV x1 and ceftazidime 2g IV q12h  Goal of Therapy:  Vancomycin trough level 15-20 mcg/ml  Plan:  -continue with vancomycin 1 time dose as ordered by EDP, then start 750mg  IV q12h at 2000 tonight  -ceftazidime 2g IV x1 (modification to EDP order), then start 1g IV q8h starting at 2000 tonight  -follow c/s, clinical progression, renal function  Hannan Hutmacher D. Kaien Pezzullo, PharmD, BCPS Clinical Pharmacist Pager: [phone] 05/05/2015 10:56 AM

## 2015-05-05 NOTE — ED Notes (Signed)
Patient transported to X-ray 

## 2015-05-05 NOTE — ED Notes (Signed)
Pt a x 4, NAD on transport.  Husband at bedside.

## 2015-05-06 ENCOUNTER — Inpatient Hospital Stay (HOSPITAL_COMMUNITY): Payer: 59

## 2015-05-06 DIAGNOSIS — J849 Interstitial pulmonary disease, unspecified: Secondary | ICD-10-CM

## 2015-05-06 DIAGNOSIS — J189 Pneumonia, unspecified organism: Secondary | ICD-10-CM

## 2015-05-06 DIAGNOSIS — R0602 Shortness of breath: Secondary | ICD-10-CM

## 2015-05-06 DIAGNOSIS — J9601 Acute respiratory failure with hypoxia: Secondary | ICD-10-CM

## 2015-05-06 LAB — LACTIC ACID, PLASMA
Lactic Acid, Venous: 1.1 mmol/L (ref 0.5–2.0)
Lactic Acid, Venous: 1.2 mmol/L (ref 0.5–2.0)

## 2015-05-06 LAB — BLOOD GAS, ARTERIAL
Acid-base deficit: 1.4 mmol/L (ref 0.0–2.0)
BICARBONATE: 23 meq/L (ref 20.0–24.0)
Drawn by: 441371
FIO2: 1 %
O2 Saturation: 92.3 %
PCO2 ART: 39.8 mmHg (ref 35.0–45.0)
PH ART: 7.38 (ref 7.350–7.450)
PO2 ART: 67.9 mmHg — AB (ref 80.0–100.0)
Patient temperature: 98.6
TCO2: 24.2 mmol/L (ref 0–100)

## 2015-05-06 LAB — COMPREHENSIVE METABOLIC PANEL
ALBUMIN: 2.7 g/dL — AB (ref 3.5–5.0)
ALT: 21 U/L (ref 14–54)
ANION GAP: 8 (ref 5–15)
AST: 19 U/L (ref 15–41)
Alkaline Phosphatase: 70 U/L (ref 38–126)
BUN: 13 mg/dL (ref 6–20)
CO2: 23 mmol/L (ref 22–32)
Calcium: 8.6 mg/dL — ABNORMAL LOW (ref 8.9–10.3)
Chloride: 109 mmol/L (ref 101–111)
Creatinine, Ser: 1.05 mg/dL — ABNORMAL HIGH (ref 0.44–1.00)
GFR calc Af Amer: >60 mL/min (ref >60)
GFR calc non Af Amer: 59 mL/min — ABNORMAL LOW (ref >60)
Glucose, Bld: 173 mg/dL — ABNORMAL HIGH (ref 65–99)
Potassium: 3.7 mmol/L (ref 3.5–5.1)
SODIUM: 140 mmol/L (ref 135–145)
TOTAL PROTEIN: 5.8 g/dL — AB (ref 6.5–8.1)
Total Bilirubin: 0.5 mg/dL (ref 0.3–1.2)

## 2015-05-06 LAB — URINE CULTURE

## 2015-05-06 LAB — CBC
HCT: 31.1 % — ABNORMAL LOW (ref 36.0–46.0)
HEMOGLOBIN: 9.8 g/dL — AB (ref 12.0–15.0)
MCH: 27.4 pg (ref 26.0–34.0)
MCHC: 31.5 g/dL (ref 30.0–36.0)
MCV: 86.9 fL (ref 78.0–100.0)
Platelets: 236 10*3/uL (ref 150–400)
RBC: 3.58 MIL/uL — ABNORMAL LOW (ref 3.87–5.11)
RDW: 15 % (ref 11.5–15.5)
WBC: 13.6 10*3/uL — AB (ref 4.0–10.5)

## 2015-05-06 LAB — D-DIMER, QUANTITATIVE (NOT AT ARMC): D DIMER QUANT: 1.02 ug{FEU}/mL — AB (ref 0.00–0.48)

## 2015-05-06 LAB — MRSA PCR SCREENING: MRSA by PCR: NEGATIVE

## 2015-05-06 LAB — PROCALCITONIN

## 2015-05-06 LAB — GLUCOSE, CAPILLARY
GLUCOSE-CAPILLARY: 199 mg/dL — AB (ref 65–99)
Glucose-Capillary: 167 mg/dL — ABNORMAL HIGH (ref 65–99)
Glucose-Capillary: 215 mg/dL — ABNORMAL HIGH (ref 65–99)
Glucose-Capillary: 207 mg/dL — ABNORMAL HIGH (ref 65–99)

## 2015-05-06 MED ORDER — PIRFENIDONE 267 MG PO CAPS
801.0000 mg | ORAL_CAPSULE | Freq: Three times a day (TID) | ORAL | Status: DC
  Administered 2015-05-09 – 2015-05-20 (×35): 801 mg via ORAL
  Filled 2015-05-06 (×20): qty 3

## 2015-05-06 MED ORDER — SODIUM CHLORIDE 0.9 % IV SOLN
INTRAVENOUS | Status: DC
  Administered 2015-05-06: 10:00:00 via INTRAVENOUS

## 2015-05-06 MED ORDER — INSULIN GLARGINE 100 UNIT/ML ~~LOC~~ SOLN
30.0000 [IU] | Freq: Every day | SUBCUTANEOUS | Status: DC
  Administered 2015-05-06 – 2015-05-07 (×2): 30 [IU] via SUBCUTANEOUS
  Filled 2015-05-06 (×3): qty 0.3

## 2015-05-06 MED ORDER — IOHEXOL 350 MG/ML SOLN
100.0000 mL | Freq: Once | INTRAVENOUS | Status: AC | PRN
  Administered 2015-05-06: 100 mL via INTRAVENOUS

## 2015-05-06 MED ORDER — FUROSEMIDE 10 MG/ML IJ SOLN
INTRAMUSCULAR | Status: AC
  Administered 2015-05-06: 40 mg
  Filled 2015-05-06: qty 4

## 2015-05-06 MED ORDER — ACETAMINOPHEN 325 MG PO TABS
650.0000 mg | ORAL_TABLET | Freq: Four times a day (QID) | ORAL | Status: DC | PRN
  Administered 2015-05-06 (×2): 650 mg via ORAL
  Filled 2015-05-06 (×2): qty 2

## 2015-05-06 MED ORDER — IPRATROPIUM-ALBUTEROL 0.5-2.5 (3) MG/3ML IN SOLN: 3.0000 mL | RESPIRATORY_TRACT | Status: DC

## 2015-05-06 MED ORDER — METHYLPREDNISOLONE SODIUM SUCC 125 MG IJ SOLR
60.0000 mg | Freq: Four times a day (QID) | INTRAMUSCULAR | Status: DC
  Administered 2015-05-06 – 2015-05-07 (×5): 60 mg via INTRAVENOUS
  Filled 2015-05-06: qty 0.96
  Filled 2015-05-06 (×2): qty 2
  Filled 2015-05-06: qty 0.96
  Filled 2015-05-06: qty 2
  Filled 2015-05-06 (×2): qty 0.96
  Filled 2015-05-06: qty 2

## 2015-05-06 MED ORDER — SODIUM CHLORIDE 0.9 % IV SOLN: INTRAVENOUS | Status: DC

## 2015-05-06 NOTE — Significant Event (Addendum)
Rapid Response Event Note    Called by Horton Finer, RN for pt in respiratory distress. 02 sats low 80s on 4 l Joann [name] increased to 5 l with no improvement and was then placed on a NRmask at 15 l with sats 98%    Overview: Time Called: 0315 Arrival Time: 0320 Event Type: Respiratory, Cardiac  Initial Focused Assessment:  Pt awake, alert,  oriented times 3, cooperative sitting up in bed.  Increased WOB noted, Crackles in bilateral bases.  Tachypnea rate 48,  IVF at 200 cc/hr Skin hot to touch.  S1S2 with HR 137 and regular.  Bilateral pedal pulses 2+.  Pt states "I don't feel well"  Denies CP but does c/o slight HA. States onset of SOB at midnight and she has not been able to sleep. C/o generalized malaise and body aches.    Interventions: Rectal temp obtained 102.4   Given tylenol 650 mg po per order. 12 lead EKG indicating ST 137 with no acute ischemic changes.  Stat PCXR taken ; results pending.    Pt meets criteria for sepsis/   LA order placed,  Kathline Magic, NP notified by Horton Finer, RN of pt status. D-dimer added to labs and IVFdecreased to 50 cc/hr. Hand off report given to [facility].  Will continue to monitor  And follow lab and CXR results   Event Summary: Name of Physician Notified: Corliss Blacker at 0340    at    Outcome: Stayed in room and stabalized     Joann [name]   0530  LA 1.1  CXR results: Left bases opacity superimposed on severe chronic changes.  D dimer and repeat VS pending  0600  126/68  126  26  98% NRB Ddimer  1.02

## 2015-05-06 NOTE — Care Management Note (Signed)
Case Management Note  Patient Details  Name: Joann [name] MRN: [id number] Date of Birth: [birth year]  Subjective/Objective:       Adm w pneumonia             Action/Plan: lives w husband, pcp dr Velna Hatchet   Expected Discharge Date:                  Expected Discharge Plan:     In-House Referral:     Discharge planning Services     Post Acute Care Choice:    Choice offered to:     DME Arranged:    DME Agency:     HH Arranged:    [place] Agency:     Status of Service:     Medicare Important Message Given:    Date Medicare IM Given:    Medicare IM give by:    Date Additional Medicare IM Given:    Additional Medicare Important Message give by:     If discussed at [place] of Stay Meetings, dates discussed:    Additional Comments: ur review done  Lacretia Leigh, RN 05/06/2015, 11:34 AM

## 2015-05-06 NOTE — Progress Notes (Signed)
Patient with respirations 44 and on 100% NRB with O2 sat 91-93% dropping quickly to 80s with removal of oxygen for breathing treatment. Per respiratory breathing treatment held. MD at bedside. Orders received to transfer patient. CT chest angiogram to assess for PE. Pt transported on telemetry and continuous pulse ox. Rapid response called for further assessment. IV solumedrol 60 mg initiated per order. CCM at bedside. Pt to transfer to 2H07. Report called to Manuela Schwartz, [facility] charge RN. Continue to monitor. Bartholomew Crews, RN

## 2015-05-06 NOTE — Progress Notes (Signed)
Pt was referred to Chaplain by nurse. Pt is afraid of a new diagnosis. Pt just loss family member recently to illness. Chaplain reassured Pt and prayed. Pt seem a little better but may need continued support.    05/06/15 1500  Clinical Encounter Type  Visited With Patient  Visit Type Spiritual support  Referral From Nurse  Spiritual Encounters  Spiritual Needs Prayer;Emotional  Stress Factors  Patient Stress Factors Health changes

## 2015-05-06 NOTE — Consult Note (Signed)
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 F/b Dr Chase Caller. Admitted w/ acute on chronic resp failure w/ working dx of HCAP vs ILD flare on 6/26. PCCM asked to see on 6/27 when O2 requirements were increasing and pt more hypoxic (see sig events below).   STUDIES:  CT chest 6/27: Known chronic fibrotic changes bilaterally.Diffuse parenchymal infiltrates identified which have increased from previous CT. This again may represent progressive changes of usualinterstitial pneumonia.Stable bilateral hilar and mediastinal lymphadenopathy.  SIGNIFICANT EVENTS: 6/24: seen by MR in routine visit/hospital f/u, lasix decreased d/t dizziness, prednisone decreased to 5mg /d; as Esbriet had been titrated up.  6/26 admitted w/ fever, chills and dyspnea.  6/27: progressive resp failure PCCM asked to see.    HISTORY OF PRESENT ILLNESS:   54 year old [name] w/ known h/o asthma, OSA, GERD, fibromyalgia and most recently diagnosed by bx (may 2016) w/ UIP w/ lymphoid hyperplasia. Since has been hospitalized twice w/ working dx of flare +/- infection and volume overload. Most recently seen by Dr Chase Caller in f/u on 6/24. At that point she still had significant dyspnea but this had improved in comparison to the time of her most recent d/c from the hospital on 6/16. At this point he had advised her to decrease her lasix d/t c/o light-headedness and decrease her prednisone to 5mg  as she had recently titrated up her Esbriet. She presented to the ED on 6/27 w/ increased shortness of breath above baseline, low grade fever and chills. She was admitted w/ working dx of HCAP vs ILD flare. ABX were started and pred increased from 5mg /d to  40. PCCM asked to see on 6/27 when O2 sats continued to worsen and she was requiring 100% non-rebreather O2 support and still would desaturate to 70s w/ any exertion. On PCCM arrival the pt was sitting up-right. She was on 100% NRB exhibiting + accessory muscle use and only able to speak in 1-2 word phrases. She was transferred to the intensive care for further supportive treatment   PAST MEDICAL HISTORY :   has a past medical history of Asthma; Diabetes mellitus; Headache(784.0); Fibromyalgia; GERD (gastroesophageal reflux disease); IBS (irritable bowel syndrome); Renal mass, right; IC (interstitial cystitis); Frequency of urination; Anemia; Swelling of both lower extremities; Hypothyroidism; Depression; Cancer; Hypertension; Wears glasses; Sleep apnea; Pulmonary fibrosis; Pneumonia; Fatty liver; and Oxygen dependent.  has past surgical history that includes Cesarean section (1983, 1985); [place]; CYSTOCOPY; Cystoscopy; Robotic assited partial nephrectomy (Right, 12/14/2013); Cystoscopy w/ ureteral stent placement (Right, 12/14/2013); Cervical laminectomy (07/2012); Abdominal hysterectomy (1996); Cholecystectomy (1995); Posterior cervical laminectomy (2004); Tubal ligation; Colonoscopy; Incisional hernia repair (N/A, 09/12/2014); Insertion of mesh (N/A, 09/12/2014); Video assisted thoracoscopy (Right, 03/21/2015); and Lung biopsy (Right, 03/21/2015). Prior to Admission medications   Medication Sig Start Date End Date Taking? Authorizing Provider  albuterol (PROVENTIL HFA;VENTOLIN HFA) 108 (90 BASE) MCG/ACT inhaler Inhale 2 puffs into the lungs every 6 (six) hours as needed. For shortness of breath   Yes Historical Provider, MD  cholecalciferol (VITAMIN D) 1000 UNITS tablet Take 1,000 Units by mouth every morning.    Yes Historical Provider, MD  dicyclomine (BENTYL) 20 MG tablet Take 20 mg by  mouth every 6 (six) hours.   Yes Historical Provider, MD  DULoxetine (CYMBALTA) 60 MG capsule Take 60 mg by mouth every  morning.    Yes Historical Provider, MD  esomeprazole (NEXIUM) 40 MG capsule Take 40 mg by mouth 2 (two) times daily before a meal.    Yes Historical Provider, MD  estrogens, conjugated, (PREMARIN) 0.625 MG tablet Take 0.625 mg by mouth every morning.    Yes Historical Provider, MD  Fluticasone-Salmeterol (ADVAIR) 100-50 MCG/DOSE AEPB Inhale 1 puff into the lungs as needed (shortness of breath).    Yes Historical Provider, MD  guaiFENesin (MUCINEX) 600 MG 12 hr tablet Take 1 tablet (600 mg total) by mouth 2 (two) times daily. Patient taking differently: Take 600 mg by mouth 2 (two) times daily as needed for cough or to loosen phlegm.  03/25/15  Yes Erin R Barrett, PA-C  insulin aspart (NOVOLOG) 100 UNIT/ML injection Inject 0-15 Units into the skin 3 (three) times daily with meals. Sliding scale with meals Patient taking differently: Inject 15 Units into the skin 3 (three) times daily with meals. Sliding scale with meals 04/02/15  Yes Domenic Polite, MD  insulin glargine (LANTUS) 100 UNIT/ML injection Inject 0.55 mLs (55 Units total) into the skin at bedtime. 04/02/15  Yes Domenic Polite, MD  levothyroxine (SYNTHROID, LEVOTHROID) 200 MCG tablet Take 200 mcg by mouth daily before breakfast.   Yes Historical Provider, MD  meclizine (ANTIVERT) 25 MG tablet Take 25 mg by mouth 3 (three) times daily as needed for dizziness.   Yes Historical Provider, MD  metFORMIN (GLUCOPHAGE-XR) 500 MG 24 hr tablet Take 1,000 mg by mouth 2 (two) times daily.   Yes Historical Provider, MD  ondansetron (ZOFRAN) 4 MG tablet Take 4 mg by mouth every 8 (eight) hours as needed for nausea or vomiting.   Yes Historical Provider, MD  oxyCODONE (OXY IR/ROXICODONE) 5 MG immediate release tablet Take 1-2 tablets (5-10 mg total) by mouth every 4 (four) hours as needed for severe pain. 04/19/15  Yes Gaye Pollack, MD  Pirfenidone (ESBRIET) 267 MG CAPS Days 1-7: 1 cap three times daily.  Days 8-14: 2 caps three times daily, Days 15 onward: 3  caps three times daily. 04/09/15  Yes Tammy S Parrett, NP  predniSONE (DELTASONE) 10 MG tablet Take 40 mg daily for 3 days, then 30 mg daily for next 3 days, then 20 mg daily for next 3 days and then 10 daily Patient taking differently: Take 10 mg by mouth every other day. Take 40 mg daily for 3 days, then 30 mg daily for next 3 days, then 20 mg daily for next 3 days and then 10 daily 04/24/15  Yes Nishant Dhungel, MD  topiramate (TOPAMAX) 25 MG tablet Take 25 mg by mouth 2 (two) times daily.   Yes Historical Provider, MD  traZODone (DESYREL) 150 MG tablet Take 75 mg by mouth at bedtime.   Yes Historical Provider, MD  vitamin E 400 UNIT capsule Take 400 Units by mouth 2 (two) times daily.    Yes Historical Provider, MD  ALPRAZolam Duanne Moron) 0.5 MG tablet Take 0.5 mg by mouth daily as needed for anxiety.     Historical Provider, MD  furosemide (LASIX) 40 MG tablet Take 1 tablet (40 mg total) by mouth daily. Patient not taking: Reported on 05/05/2015 04/25/15   Louellen Molder, MD   Allergies  Allergen Reactions  . Peanut-Containing Drug Products Shortness Of Breath    Pecans, walnuts as well  .  Peanuts [Peanut Oil] Shortness Of Breath  . Sulfa Antibiotics Hives and Shortness Of Breath  . Betadine [Povidone Iodine] Rash  . Latex Rash    FAMILY HISTORY:  indicated that her mother is deceased. She indicated that her father is alive.  SOCIAL HISTORY:  reports that she has never smoked. She has never used smokeless tobacco. She reports that she drinks alcohol. She reports that she does not use illicit drugs.  REVIEW OF SYSTEMS:   General: + recent fever/chills HENT: no nasal congestion, HA, sore throat Pulm: marked progressive SOB, + non-productive cough, SOB w/ any exertion Card: no current CP, no palps. Some LE swelling Abd: no N/V/D. GU: no freq/hesistency/burning Neuro: no HA, + dizziness.   SUBJECTIVE: + sob w/ any exertion   VITAL SIGNS: Temp:  [98.5 F (36.9 C)-102.4 F (39.1 C)] 99.1 F  (37.3 C) (06/27 1036) Pulse Rate:  [18-137] 101 (06/27 1007) Resp:  [17-48] 40 (06/27 1007) BP: (95-132)/(44-83) 119/65 mmHg (06/27 1007) SpO2:  [78 %-98 %] 91 % (06/27 1036) FiO2 (%):  [100 %] 100 % (06/27 0743) Weight:  [85 kg (187 lb 6.3 oz)] 85 kg (187 lb 6.3 oz) (06/26 1309) HEMODYNAMICS:   VENTILATOR SETTINGS: Vent Mode:  [-]  FiO2 (%):  [100 %] 100 % INTAKE / OUTPUT:  Intake/Output Summary (Last 24 hours) at 05/06/15 1114 Last data filed at 05/06/15 1039  Gross per 24 hour  Intake 1926.67 ml  Output   2450 ml  Net -523.33 ml    PHYSICAL EXAMINATION: General:  Obese 54 year old white female, very short of breath + accessory muscle use, speaks in short phrases  Neuro:  Awake, alert, no focal def  HEENT:  [place], no JVD facial adiposity  Cardiovascular:  Rrr, tachy ST  Lungs:  velcro sounding posterior rales, ++ accessory muscle use  Abdomen:  Obese + bowel sounds  Musculoskeletal:  Intact  Skin:  Intact   LABS:  CBC  Recent Labs Lab 05/05/15 0940 05/05/15 1420 05/06/15 0415  WBC 17.2* 12.7* 13.6*  HGB 11.8* 9.4* 9.8*  HCT 37.1 29.9* 31.1*  PLT 277 244 236   Coag's No results for input(s): APTT, INR in the last 168 hours. BMET  Recent Labs Lab 05/05/15 0940 05/05/15 1420 05/06/15 0415  NA 139  --  140  K 3.9  --  3.7  CL 103  --  109  CO2 25  --  23  BUN 19  --  13  CREATININE 1.08* 1.10* 1.05*  GLUCOSE 75  --  173*   Electrolytes  Recent Labs Lab 05/05/15 0940 05/06/15 0415  CALCIUM 9.1 8.6*   Sepsis Markers  Recent Labs Lab 05/05/15 1243 05/06/15 0415 05/06/15 0607  LATICACIDVEN 2.31* 1.1 1.2   ABG  Recent Labs Lab 05/06/15 1005  PHART 7.380  PCO2ART 39.8  PO2ART 67.9*   Liver Enzymes  Recent Labs Lab 05/06/15 0415  AST 19  ALT 21  ALKPHOS 70  BILITOT 0.5  ALBUMIN 2.7*   Cardiac Enzymes No results for input(s): TROPONINI, PROBNP in the last 168 hours. Glucose  Recent Labs Lab 05/05/15 1333 05/05/15 1653  05/05/15 2004 05/06/15 0752  GLUCAP 177* 162* 169* 167*    Imaging Ct Angio Chest Pe W/cm &/or Wo Cm  05/06/2015   CLINICAL DATA:  Shortness of Breath  EXAM: CT ANGIOGRAPHY CHEST WITH CONTRAST  TECHNIQUE: Multidetector CT imaging of the chest was performed using the standard protocol during bolus administration of intravenous contrast. Multiplanar  CT image reconstructions and MIPs were obtained to evaluate the vascular anatomy.  CONTRAST:  174mL OMNIPAQUE IOHEXOL 350 MG/ML SOLN  COMPARISON:  03/29/2015  FINDINGS: Lungs are well aerated bilaterally without focal effusion. Diffuse fibrotic changes with increased superimposed infiltrative changes are noted.  The thoracic inlet is within normal limits. The thoracic aorta demonstrates some mild atherosclerotic calcifications. No aneurysmal dilatation is noted. The pulmonary artery is well visualized and shows a normal branching pattern. No definitive intraluminal filling defect to suggest pulmonary embolism is identified. Scattered hilar and mediastinal lymphadenopathy is identified. The lymphadenopathy is stable from the prior exam. The visualized upper abdomen is within normal limits. No acute bony abnormality is seen.  Review of the MIP images confirms the above findings.  IMPRESSION: Known chronic fibrotic changes bilaterally.  Diffuse parenchymal infiltrates identified which have increased from previous CT. This again may represent progressive changes of usual interstitial pneumonia.  Stable bilateral hilar and mediastinal lymphadenopathy.   Electronically Signed   By: Inez Catalina M.D.   On: 05/06/2015 09:57   Dg Chest Port 1 View  05/06/2015   CLINICAL DATA:  Dyspnea  EXAM: PORTABLE CHEST - 1 VIEW  COMPARISON:  05/05/2015 at 10:01  FINDINGS: There is left base airspace opacity which has worsened. This could represent infectious infiltrate or alveolar edema, superimposed on the severe chronic fibrotic changes. No large effusion is evident. No  pneumothorax is evident.  IMPRESSION: Developing left base airspace opacity superimposed on severe chronic changes.   Electronically Signed   By: Andreas Newport M.D.   On: 05/06/2015 04:08     ASSESSMENT / PLAN:  PULMONARY OETT A: Acute on chronic hypoxic respiratory failure in setting of acute on chronic pulmonary infiltrates. Favor flare of underlying ILD, although on visit 6/24 had both lasix and pred dosing decreased, also developed fever 6/26 so volume overload and infection also possible etiology or at least contributing factors.  UIP/ILD w/ lymphoid hyperplasia (diagnosed by lung dx may 2016): on 3 liters baseline, recently started on Esbriet.  OSA  P:   Transfer to ICU Cycle high flow O2 w/ BIPAP IV lasix  PCT algo to limit ABX exposure Change pred to IV solumedrol Cardiac enzymes Consider repeat echo  CARDIOVASCULAR CVL A:  Grade I diastolic HF  Probable element of pulmonary edema  P:  Lasix IV  Tele  BP control   RENAL A:   Active diuresis  P:   Replace K w/ lasix Strict I&O Place foley cath  F/u chemistry in am   GASTROINTESTINAL A:   Obesity  P:   Cl liq for now given work of breathing   HEMATOLOGIC A:   Anemia of chronic disease P:  Trend cbc [place] heparin   INFECTIOUS A:   R/o HCAP  P:   u strep 6/27>>> vanc 6/26>>> fortaz 6/26>>>  ENDOCRINE A:   Dm type II w/ Hyperglycemia   Hypothyroidism  P:   ssi protocol   NEUROLOGIC A:   Anxiety  P:   RASS goal: 0 Supportive care    FAMILY  - Updates:   - Inter-disciplinary family meet or Palliative Care meeting due by: 7/2    TODAY'S SUMMARY:  Not clear if this is IPF flare, infection, or volume overload. Could be a mix of all three. She most recently had both her lasix and her prednisone decreased. I am not convinced that she is infected. At this point plan is to add procalcitonins, increased steroids and add IV diuresis in  addition to antibiotics already on board. Will try  high-flow O2 and then intermittent BIPAP as needed. Have began to discuss goals of care. She has not discussed this with either her family or any of her health care providers. At this point she was very emotional and was not able to really articulate more than saying "living on a ventilator is not living". Although she did not say this doubt she would want prolonged ventilation. We will have to discuss this further. I suspect that some of the concern she has is that she recently had to remove both of her parents from a mechanical ventilator so suspect that this weighs heavily on her. For now she is full Code. We will visit this further even if she improves as given the frequency of her decompensation suspect we will soon be dealing with this again.   Erick Colace ACNP-BC [place] Pager # [phone] OR # [phone] if no answer  54 year old female with history of IPF presenting with fluid overload and PNA, can not r/o IPF flare at this point.  Hypoxic on 100% NRB.  Will transfer to the ICU, start BiPAP and try to avoid intubation as I do not anticipate extubation will be easy in this patient.  If fails then will intubate and will need to discuss plan of care.  In the meantime, broad spectrum abx will be started and will actively diurese patient.  Monitor closely in the ICU.  The patient is critically ill with multiple organ systems failure and requires high complexity decision making for assessment and support, frequent evaluation and titration of therapies, application of advanced monitoring technologies and extensive interpretation of multiple databases.   Critical Care Time devoted to patient care services described in this note is  35  Minutes. This time reflects time of care of this signee Dr Jennet Maduro. This critical care time does not reflect procedure time, or teaching time or supervisory time of PA/NP/Med student/Med Resident etc but could involve care discussion  time.  Rush Farmer, M.D. [facility] Pulmonary/Critical Care Medicine. Pager: [phone]. After hours pager: [phone].

## 2015-05-06 NOTE — Significant Event (Signed)
Rapid Response Event Note  Overview: Time Called: 1000 Arrival Time: 1005 Event Type: Respiratory  Initial Focused Assessment: Patient on 100% NRB since earlier this morning.  Patient with continued RR mid 40s.  Skin pink. ABG 7.38  Pco2 39  Po2 67  On 100% NRB Lung sounds with crackles in bases Heart tones regular  Interventions: Notified MD of ABG results CCM at bedside to assess patient Laurey Arrow NP) 40mg  Lasix given IV Transferred to 2H07 via bed with O2 and heart monitor. Placed on HFNC  Event Summary: Name of Physician Notified: Dr Waldron Labs at bedside prior to my arrival at 94  Name of Consulting Physician Notified: CCM/ Dr Hulda Marin NP at 1030  Outcome: Transferred (Comment)     Raliegh Ip

## 2015-05-06 NOTE — Progress Notes (Signed)
Patient Demographics  Joann [name], is a 54 y.o. female, DOB - 1961/03/30, ZLD:[id number]  Admit date - 05/05/2015   Admitting Physician Oswald Hillock, MD  Outpatient Primary MD for the patient is Velna Hatchet, MD  LOS - 1   Chief Complaint  Patient presents with  . Shortness of Breath       Admission HPI/Brief narrative: 54 year old female with recent diagnosis of IPF, on 3 L nasal cannula at baseline, admitted 6/26 with worsening respiratory failure, hypoxia, , admitted 6/26 with complaints of shortness of breath, fever, patient was 3 L nasal cannula baseline, patient had a response 6/27 a.m. secondary to worsening respiratory failure.  Subjective:   Joann [name] today has, No headache, No chest pain, No abdominal pain - No Nausea, No new weakness tingling or numbness, complaints of cough, shortness of breath.  Assessment & Plan    Active Problems:   HTN (hypertension)   ILD (interstitial lung disease)   Shortness of breath   HCAP (healthcare-associated pneumonia)  Acute on chronic hypoxic respiratory failure - Differential diagnoses include ILD flare versus HCAP, less likely volume overload. - Continue with IV antibiotics, currently on IV steroids, and on diuresis. - Pulmonary consult greatly appreciated, is fair to ICU, high flow O2 with BiPAP, - CT chest negative for PE  Chronic Diastolic CHF - Continue with IV diuresis  Diabetes mellitus - On insulin sliding scale  Hypothyroidism - On Synthroid  Anemia of chronic disease - Hemoglobin at baseline.  Code Status: Full  Family Communication: none at bedside  Disposition Plan: transferred to ICU.   Procedures  none   Consults   PCCM   Medications  Scheduled Meds: . antiseptic oral rinse  7 mL Mouth Rinse BID  . cefTAZidime (FORTAZ)  IV  1 g Intravenous Q8H  . dicyclomine  20 mg Oral TID AC & HS  . DULoxetine  60  mg Oral q morning - 10a  . enoxaparin (LOVENOX) injection  40 mg Subcutaneous Q24H  . insulin aspart  0-20 Units Subcutaneous TID WC  . insulin glargine  30 Units Subcutaneous QHS  . ipratropium-albuterol  3 mL Nebulization TID  . levothyroxine  200 mcg Oral QAC breakfast  . methylPREDNISolone (SOLU-MEDROL) injection  60 mg Intravenous Q6H  . pantoprazole  40 mg Oral Daily  . Pirfenidone  534 mg Oral TID  . topiramate  25 mg Oral BID  . traZODone  75 mg Oral QHS  . vancomycin  750 mg Intravenous Q12H  . vitamin E  400 Units Oral BID   Continuous Infusions: . sodium chloride 10 mL/hr (05/06/15 1217)   PRN Meds:.acetaminophen, albuterol, ALPRAZolam, guaiFENesin, meclizine, ondansetron **OR** ondansetron (ZOFRAN) IV, oxyCODONE  DVT Prophylaxis  Lovenox -  Lab Results  Component Value Date   PLT 236 05/06/2015    Antibiotics    Anti-infectives    Start     Dose/Rate Route Frequency Ordered Stop   05/05/15 2000  vancomycin (VANCOCIN) IVPB 750 mg/150 ml premix     750 mg 150 mL/hr over 60 Minutes Intravenous Every 12 hours 05/05/15 1100     05/05/15 2000  cefTAZidime (FORTAZ) 1 g in dextrose 5 % 50 mL IVPB     1 g 100 mL/hr  over 30 Minutes Intravenous Every 8 hours 05/05/15 1100     05/05/15 1115  cefTAZidime (FORTAZ) 2 g in dextrose 5 % 50 mL IVPB  Status:  Discontinued     2 g 100 mL/hr over 30 Minutes Intravenous Every 12 hours 05/05/15 1027 05/05/15 1100   05/05/15 1115  cefTAZidime (FORTAZ) 2 g in dextrose 5 % 50 mL IVPB     2 g 100 mL/hr over 30 Minutes Intravenous Every 12 hours 05/05/15 1100 05/05/15 1151   05/05/15 1030  vancomycin (VANCOCIN) IVPB 1000 mg/200 mL premix     1,000 mg 200 mL/hr over 60 Minutes Intravenous  Once 05/05/15 1027 05/06/15 0700          Objective:   Filed Vitals:   05/06/15 1127 05/06/15 1133 05/06/15 1200 05/06/15 1300  BP: 130/77  104/70 109/71  Pulse: 110  96 96  Temp: 98 F (36.7 C)     TempSrc: Oral     Resp: 41  45 45   Height:      Weight:      SpO2: 97% 93% 94% 95%    Wt Readings from Last 3 Encounters:  05/05/15 85 kg (187 lb 6.3 oz)  05/03/15 83.643 kg (184 lb 6.4 oz)  04/30/15 82.101 kg (181 lb)     Intake/Output Summary (Last 24 hours) at 05/06/15 1419 Last data filed at 05/06/15 1300  Gross per 24 hour  Intake 1507.17 ml  Output   3750 ml  Net -2242.83 ml     Physical Exam  Awake Alert, Oriented X 3, in respiratory distress [place].AT,PERRAL Supple Neck,No JVD,  Symmetrical Chest wall movement, tachypneic, decreased air movement bilaterally RRR,No Gallops,Rubs or new Murmurs, No Parasternal Heave +ve B.Sounds, Abd Soft, No tenderness, No organomegaly appriciated, . No Cyanosis, Clubbing or edema,   Data Review   Micro Results Recent Results (from the past 240 hour(s))  Blood Culture (routine x 2)     Status: None (Preliminary result)   Collection Time: 05/05/15  9:42 AM  Result Value Ref Range Status   Specimen Description BLOOD RIGHT ANTECUBITAL  Final   Special Requests BOTTLES DRAWN AEROBIC AND ANAEROBIC 5CC  Final   Culture PENDING  Incomplete   Report Status PENDING  Incomplete  Urine culture     Status: None   Collection Time: 05/05/15  9:53 AM  Result Value Ref Range Status   Specimen Description URINE, CLEAN CATCH  Final   Special Requests NONE  Final   Culture   Final    MULTIPLE SPECIES PRESENT, SUGGEST RECOLLECTION IF CLINICALLY INDICATED   Report Status 05/06/2015 FINAL  Final  MRSA PCR Screening     Status: None   Collection Time: 05/06/15 11:51 AM  Result Value Ref Range Status   MRSA by PCR NEGATIVE NEGATIVE Final    Comment:        The GeneXpert MRSA Assay (FDA approved for NASAL specimens only), is one component of a comprehensive MRSA colonization surveillance program. It is not intended to diagnose MRSA infection nor to guide or monitor treatment for MRSA infections.     Radiology Reports Dg Chest 2 View (if Patient Has Fever And/or  Copd)  05/05/2015   CLINICAL DATA:  Shortness of breath, wheezing, fever. History of pulmonary fibrosis.  EXAM: CHEST  2 VIEW  COMPARISON:  04/30/2015  FINDINGS: Cardiomediastinal silhouette is unchanged, with the cardiac silhouette being within normal limits for size. Lung volumes remain diminished with similar appearance of chronic interstitial  lung disease. No definite acute airspace consolidation, overt edema, pleural effusion, or pneumothorax is identified. Upper abdominal surgical clips are noted. No acute osseous abnormality is identified. Prior lower cervical ACDF.  IMPRESSION: Unchanged appearance of the chest. Chronic interstitial lung disease consistent with history of fibrosis.   Electronically Signed   By: Logan Bores   On: 05/05/2015 10:22   Dg Chest 2 View  05/01/2015   CLINICAL DATA:  Initial encounter for two-day history of right-sided chest pain. History pulmonary fibrosis.  EXAM: CHEST  2 VIEW  COMPARISON:  04/25/2015.  FINDINGS: Two views study again shows diffuse underlying chronic interstitial lung disease. No new focal airspace consolidation or evidence of overt pulmonary edema. No pleural effusion. Cardiopericardial silhouette is at upper limits of normal for size. Imaged bony structures of the thorax are intact.  IMPRESSION: Stable. Diffuse chronic underlying interstitial changes compatible with reported history of pulmonary fibrosis.   Electronically Signed   By: Misty Stanley M.D.   On: 05/01/2015 08:20   Dg Chest 2 View  04/25/2015   CLINICAL DATA:  Short of breath for several days  EXAM: CHEST  2 VIEW  COMPARISON:  Chest x-ray of 04/23/2015  FINDINGS: Coarse interstitial markings remain bilaterally consistent with pulmonary fibrosis. No focal infiltrate or effusion is seen. Mild cardiomegaly is stable. A lower anterior cervical spine fusion plate is present.  IMPRESSION: Little change in pulmonary fibrosis and cardiomegaly. No definite active process.   Electronically Signed    By: Ivar Drape M.D.   On: 04/25/2015 08:19   Dg Chest 2 View  04/23/2015   CLINICAL DATA:  Chest tightness for 2 days. Hypertension, diabetes, asthma. Shortness of breath.  EXAM: CHEST  2 VIEW  COMPARISON:  04/19/2015  FINDINGS: Diffuse bilateral airspace parenchymal disease in the lungs with low lung volumes consistent with chronic fibrosis. No definite superimposed consolidation. Normal heart size and pulmonary vascularity. No blunting of costophrenic angles. No pneumothorax. Postoperative changes in the cervical spine. Small bilateral cervical ribs.  IMPRESSION: Chronic pulmonary fibrosis. No significant change since prior study.   Electronically Signed   By: Lucienne Capers M.D.   On: 04/23/2015 22:27   Dg Chest 2 View  04/19/2015   CLINICAL DATA:  Interstitial lung disease. Shortness of breath and pain in the chest. Recent lung biopsy.  EXAM: CHEST  2 VIEW  COMPARISON:  04/09/2015  FINDINGS: A tiny right apical pneumothorax (less than 5%) could persist, but would be unchanged. Stable appearance of fibrotic lung disease with low lung volumes. Stable heart size and mediastinal contours, including bilateral hilar and mediastinal lymphadenopathy. There is no edema, consolidation, effusion, or pneumothorax.  IMPRESSION: 1. An interface at the right apex could be osseous or from stable trace pneumothorax. 2. Pulmonary fibrosis without new opacity.   Electronically Signed   By: Monte Fantasia M.D.   On: 04/19/2015 10:55   Dg Chest 2 View  04/09/2015   CLINICAL DATA:  Pneumonia.  Pneumothorax, and thoracostomy.  EXAM: CHEST - 2 VIEW  COMPARISON:  04/01/2015 and 03/30/2015.  FINDINGS: The heart is enlarged. A chronic interstitial pattern is again noted. A small right-sided pneumothorax is again suspected. No significant left-sided pneumothorax is present. The visualized soft tissues and bony thorax are unremarkable.  IMPRESSION: 1. A small right-sided pneumothorax is again suspected. 2. Stable cardiomegaly  and interstitial lung disease. These results will be called to the ordering clinician or representative by the Radiologist Assistant, and communication documented in the PACS or zVision  Dashboard.   Electronically Signed   By: San Morelle M.D.   On: 04/09/2015 09:24   Ct Angio Chest Pe W/cm &/or Wo Cm  05/06/2015   CLINICAL DATA:  Shortness of Breath  EXAM: CT ANGIOGRAPHY CHEST WITH CONTRAST  TECHNIQUE: Multidetector CT imaging of the chest was performed using the standard protocol during bolus administration of intravenous contrast. Multiplanar CT image reconstructions and MIPs were obtained to evaluate the vascular anatomy.  CONTRAST:  170mL OMNIPAQUE IOHEXOL 350 MG/ML SOLN  COMPARISON:  03/29/2015  FINDINGS: Lungs are well aerated bilaterally without focal effusion. Diffuse fibrotic changes with increased superimposed infiltrative changes are noted.  The thoracic inlet is within normal limits. The thoracic aorta demonstrates some mild atherosclerotic calcifications. No aneurysmal dilatation is noted. The pulmonary artery is well visualized and shows a normal branching pattern. No definitive intraluminal filling defect to suggest pulmonary embolism is identified. Scattered hilar and mediastinal lymphadenopathy is identified. The lymphadenopathy is stable from the prior exam. The visualized upper abdomen is within normal limits. No acute bony abnormality is seen.  Review of the MIP images confirms the above findings.  IMPRESSION: Known chronic fibrotic changes bilaterally.  Diffuse parenchymal infiltrates identified which have increased from previous CT. This again may represent progressive changes of usual interstitial pneumonia.  Stable bilateral hilar and mediastinal lymphadenopathy.   Electronically Signed   By: Inez Catalina M.D.   On: 05/06/2015 09:57   Dg Chest Port 1 View  05/06/2015   CLINICAL DATA:  Dyspnea  EXAM: PORTABLE CHEST - 1 VIEW  COMPARISON:  05/05/2015 at 10:01  FINDINGS: There is  left base airspace opacity which has worsened. This could represent infectious infiltrate or alveolar edema, superimposed on the severe chronic fibrotic changes. No large effusion is evident. No pneumothorax is evident.  IMPRESSION: Developing left base airspace opacity superimposed on severe chronic changes.   Electronically Signed   By: Andreas Newport M.D.   On: 05/06/2015 04:08     CBC  Recent Labs Lab 05/05/15 0940 05/05/15 1420 05/06/15 0415  WBC 17.2* 12.7* 13.6*  HGB 11.8* 9.4* 9.8*  HCT 37.1 29.9* 31.1*  PLT 277 244 236  MCV 86.7 86.4 86.9  MCH 27.6 27.2 27.4  MCHC 31.8 31.4 31.5  RDW 15.0 15.1 15.0    Chemistries   Recent Labs Lab 05/05/15 0940 05/05/15 1420 05/06/15 0415  NA 139  --  140  K 3.9  --  3.7  CL 103  --  109  CO2 25  --  23  GLUCOSE 75  --  173*  BUN 19  --  13  CREATININE 1.08* 1.10* 1.05*  CALCIUM 9.1  --  8.6*  AST  --   --  19  ALT  --   --  21  ALKPHOS  --   --  70  BILITOT  --   --  0.5   ------------------------------------------------------------------------------------------------------------------ estimated creatinine clearance is 57.9 mL/min (by C-G formula based on Cr of 1.05). ------------------------------------------------------------------------------------------------------------------ No results for input(s): HGBA1C in the last 72 hours. ------------------------------------------------------------------------------------------------------------------ No results for input(s): CHOL, HDL, LDLCALC, TRIG, CHOLHDL, LDLDIRECT in the last 72 hours. ------------------------------------------------------------------------------------------------------------------ No results for input(s): TSH, T4TOTAL, T3FREE, THYROIDAB in the last 72 hours.  Invalid input(s): FREET3 ------------------------------------------------------------------------------------------------------------------ No results for input(s): VITAMINB12, FOLATE, FERRITIN,  TIBC, IRON, RETICCTPCT in the last 72 hours.  Coagulation profile No results for input(s): INR, PROTIME in the last 168 hours.   Recent Labs  05/06/15 0415  DDIMER 1.02*  Cardiac Enzymes No results for input(s): CKMB, TROPONINI, MYOGLOBIN in the last 168 hours.  Invalid input(s): CK ------------------------------------------------------------------------------------------------------------------ Invalid input(s): POCBNP     Time Spent in minutes   30 minutes   Nikolos Billig M.D on 05/06/2015 at 2:19 PM  Between 7am to 7pm - Pager - [phone]  After 7pm go to [email or web address] - password [email or web address]  [facility]   Office  [phone]

## 2015-05-07 DIAGNOSIS — R0902 Hypoxemia: Secondary | ICD-10-CM

## 2015-05-07 LAB — COMPREHENSIVE METABOLIC PANEL
ALT: 20 U/L (ref 14–54)
ANION GAP: 10 (ref 5–15)
AST: 24 U/L (ref 15–41)
Albumin: 2.8 g/dL — ABNORMAL LOW (ref 3.5–5.0)
Alkaline Phosphatase: 75 U/L (ref 38–126)
BUN: 22 mg/dL — ABNORMAL HIGH (ref 6–20)
CALCIUM: 8.3 mg/dL — AB (ref 8.9–10.3)
CO2: 21 mmol/L — ABNORMAL LOW (ref 22–32)
Chloride: 108 mmol/L (ref 101–111)
Creatinine, Ser: 1 mg/dL (ref 0.44–1.00)
GLUCOSE: 207 mg/dL — AB (ref 65–99)
Potassium: 3.6 mmol/L (ref 3.5–5.1)
SODIUM: 139 mmol/L (ref 135–145)
Total Bilirubin: 0.5 mg/dL (ref 0.3–1.2)
Total Protein: 6.5 g/dL (ref 6.5–8.1)

## 2015-05-07 LAB — CBC
HCT: 31.7 % — ABNORMAL LOW (ref 36.0–46.0)
Hemoglobin: 10.2 g/dL — ABNORMAL LOW (ref 12.0–15.0)
MCH: 27.6 pg (ref 26.0–34.0)
MCHC: 32.2 g/dL (ref 30.0–36.0)
MCV: 85.7 fL (ref 78.0–100.0)
Platelets: 274 10*3/uL (ref 150–400)
RBC: 3.7 MIL/uL — ABNORMAL LOW (ref 3.87–5.11)
RDW: 14.8 % (ref 11.5–15.5)
WBC: 18.1 10*3/uL — ABNORMAL HIGH (ref 4.0–10.5)

## 2015-05-07 LAB — PROCALCITONIN: PROCALCITONIN: 0.99 ng/mL

## 2015-05-07 LAB — GLUCOSE, CAPILLARY
GLUCOSE-CAPILLARY: 180 mg/dL — AB (ref 65–99)
Glucose-Capillary: 206 mg/dL — ABNORMAL HIGH (ref 65–99)
Glucose-Capillary: 200 mg/dL — ABNORMAL HIGH (ref 65–99)

## 2015-05-07 LAB — STREP PNEUMONIAE URINARY ANTIGEN: Strep Pneumo Urinary Antigen: NEGATIVE

## 2015-05-07 MED ORDER — POTASSIUM CHLORIDE CRYS ER 20 MEQ PO TBCR
40.0000 meq | EXTENDED_RELEASE_TABLET | Freq: Once | ORAL | Status: AC
  Administered 2015-05-07: 40 meq via ORAL
  Filled 2015-05-07: qty 2

## 2015-05-07 MED ORDER — METHYLPREDNISOLONE SODIUM SUCC 125 MG IJ SOLR
80.0000 mg | Freq: Four times a day (QID) | INTRAMUSCULAR | Status: DC
  Administered 2015-05-07 – 2015-05-09 (×8): 80 mg via INTRAVENOUS
  Filled 2015-05-07 (×8): qty 1.28

## 2015-05-07 MED ORDER — FUROSEMIDE 10 MG/ML IJ SOLN
40.0000 mg | Freq: Two times a day (BID) | INTRAMUSCULAR | Status: DC
  Administered 2015-05-07 (×2): 40 mg via INTRAVENOUS
  Filled 2015-05-07 (×4): qty 4

## 2015-05-07 MED ORDER — MORPHINE SULFATE 10 MG/5ML PO SOLN: 4.0000 mg | Freq: Once | ORAL | Status: DC

## 2015-05-07 MED ORDER — MORPHINE SULFATE (CONCENTRATE) 10 MG/0.5ML PO SOLN: 4.0000 mg | Freq: Once | ORAL | Status: DC

## 2015-05-07 MED ORDER — CEFTRIAXONE SODIUM IN DEXTROSE 20 MG/ML IV SOLN
1.0000 g | INTRAVENOUS | Status: DC
  Filled 2015-05-07: qty 50

## 2015-05-07 MED ORDER — DEXTROSE 5 % IV SOLN
1.0000 g | Freq: Three times a day (TID) | INTRAVENOUS | Status: DC
  Administered 2015-05-07 – 2015-05-09 (×7): 1 g via INTRAVENOUS
  Filled 2015-05-07 (×9): qty 1

## 2015-05-07 MED ORDER — MORPHINE SULFATE 2 MG/ML IJ SOLN
2.0000 mg | INTRAMUSCULAR | Status: DC | PRN
  Administered 2015-05-07 – 2015-05-19 (×18): 2 mg via INTRAVENOUS
  Filled 2015-05-07 (×20): qty 1

## 2015-05-07 MED ORDER — MIDAZOLAM HCL 2 MG/2ML IJ SOLN
0.5000 mg | INTRAMUSCULAR | Status: DC | PRN
  Administered 2015-05-07: 0.5 mg via INTRAVENOUS
  Filled 2015-05-07: qty 2

## 2015-05-07 NOTE — Progress Notes (Signed)
Inpatient Diabetes Program Recommendations  AACE/ADA: New Consensus Statement on Inpatient Glycemic Control (2013)  Target Ranges:  Prepandial:   less than 140 mg/dL      Peak postprandial:   less than 180 mg/dL (1-2 hours)      Critically ill patients:  140 - 180 mg/dL   Review of Glycemic Control:  Results for QUANESHA, KLIMASZEWSKI (MRN [id number]) as of 05/07/2015 10:23  Ref. Range 05/06/2015 07:52 05/06/2015 11:49 05/06/2015 15:28 05/06/2015 20:27 05/07/2015 07:26  Glucose-Capillary Latest Ref Range: 65-99 mg/dL 167 (H) 215 (H) 207 (H) 199 (H) 206 (H)   Diabetes history: Type 2 diabetes Outpatient Diabetes medications: Lantus 55 units q HS, Novolog 15 units tid with meals Current orders for Inpatient glycemic control:  Lantus 30 units q HS, Novolog resist. tid with meals--Solumedrol 60 mg IV q 6 hours  Consider increasing Lantus 40 units q HS.  Thanks, Adah Perl, RN, BC-ADM Inpatient Diabetes Coordinator Pager [phone] (8a-5p)

## 2015-05-07 NOTE — Progress Notes (Signed)
Patient placed back on Bipap.  Currently tolerating well.

## 2015-05-07 NOTE — Progress Notes (Signed)
Patient taken off of bipap and placed on high flow nasal cannula.  Currently tolerating well.  Will continue to monitor.

## 2015-05-07 NOTE — Progress Notes (Signed)
eLink Physician-Brief Progress Note Patient Name: Joann [name] DOB: 1961/03/27 MRN: [id number]   Date of Service  05/07/2015  HPI/Events of Note  RN feels patient anxious with dyspnea  eICU Interventions  Oral morphine 4mg  stat Day time to consider palliative care for goals of care v Transplant referreral/discussion - She is deteriorating faster than we can anticipate     Intervention Category Intermediate Interventions: Respiratory distress - evaluation and management  Kieon Lawhorn 05/07/2015, 5:12 PM

## 2015-05-07 NOTE — Progress Notes (Signed)
Pt. Wanted to remain on bipap. Pt. Did not want to come off for 2 hour break. Pt. Family member stated that pt. Has sleep apnea and loves wearing her bipap at night. Pt. States she wears at night at home as well. RT will continue to monitor and offer pt. The opportunity for breaks from the bipap.

## 2015-05-07 NOTE — Progress Notes (Signed)
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 F/b Dr Chase Caller. Admitted w/ acute on chronic resp failure w/ working dx of HCAP vs ILD flare on 6/26. PCCM asked to see on 6/27 when O2 requirements were increasing and pt more hypoxic (see sig events below).   STUDIES:  CT chest 6/27: Known chronic fibrotic changes bilaterally.Diffuse parenchymal infiltrates identified which have increased from previous CT. This again may represent progressive changes of usualinterstitial pneumonia.Stable bilateral hilar and mediastinal lymphadenopathy.  SIGNIFICANT EVENTS: 6/24: seen by MR in routine visit/hospital f/u, lasix decreased d/t dizziness, prednisone decreased to 5mg /d; as Esbriet had been titrated up.  6/26 admitted w/ fever, chills and dyspnea.  6/27: progressive resp failure PCCM asked to see.  6/28: DNR status initatied, Bipap intervals, increased steroids   HISTORY OF PRESENT ILLNESS:   54 year old [name] w/ known h/o asthma, OSA, GERD, fibromyalgia and most recently diagnosed by bx (may 2016) w/ UIP w/ lymphoid hyperplasia. Since has been hospitalized twice w/ working dx of flare +/- infection and volume overload. Most recently seen by Dr Chase Caller in f/u on 6/24. At that point she still had significant dyspnea but this had improved in comparison to the time of her most recent d/c from the hospital on 6/16. At this point he had advised her to decrease her lasix d/t c/o light-headedness and decrease her prednisone to 5mg  as she had recently titrated up her Esbriet. She presented to the ED on 6/27 w/ increased shortness of breath above baseline, low grade fever and chills. She was admitted w/ working dx of HCAP  vs ILD flare. ABX were started and pred increased from 5mg /d to 40. PCCM asked to see on 6/27 when O2 sats continued to worsen and she was requiring 100% non-rebreather O2 support and still would desaturate to 70s w/ any exertion. On PCCM arrival the pt was sitting up-right. She was on 100% NRB exhibiting + accessory muscle use and only able to speak in 1-2 word phrases. She was transferred to the intensive care for further supportive treatment   PAST MEDICAL HISTORY :   has a past medical history of Asthma; Diabetes mellitus; Headache(784.0); Fibromyalgia; GERD (gastroesophageal reflux disease); IBS (irritable bowel syndrome); Renal mass, right; IC (interstitial cystitis); Frequency of urination; Anemia; Swelling of both lower extremities; Hypothyroidism; Depression; Cancer; Hypertension; Wears glasses; Sleep apnea; Pulmonary fibrosis; Pneumonia; Fatty liver; and Oxygen dependent.  has past surgical history that includes Cesarean section (1983, 1985); [place]; CYSTOCOPY; Cystoscopy; Robotic assited partial nephrectomy (Right, 12/14/2013); Cystoscopy w/ ureteral stent placement (Right, 12/14/2013); Cervical laminectomy (07/2012); Abdominal hysterectomy (1996); Cholecystectomy (1995); Posterior cervical laminectomy (2004); Tubal ligation; Colonoscopy; Incisional hernia repair (N/A, 09/12/2014); Insertion of mesh (N/A, 09/12/2014); Video assisted thoracoscopy (Right, 03/21/2015); and Lung biopsy (Right, 03/21/2015). Prior to Admission medications   Medication Sig Start Date End Date Taking? Authorizing Provider  albuterol (PROVENTIL HFA;VENTOLIN HFA) 108 (90 BASE) MCG/ACT inhaler Inhale 2 puffs into the lungs every 6 (six) hours as needed. For shortness of breath   Yes Historical Provider, MD  cholecalciferol (VITAMIN D) 1000 UNITS tablet Take 1,000 Units by mouth every morning.    Yes Historical Provider, MD  dicyclomine (  BENTYL) 20 MG tablet Take 20 mg by mouth every 6 (six) hours.   Yes Historical Provider, MD   DULoxetine (CYMBALTA) 60 MG capsule Take 60 mg by mouth every morning.    Yes Historical Provider, MD  esomeprazole (NEXIUM) 40 MG capsule Take 40 mg by mouth 2 (two) times daily before a meal.    Yes Historical Provider, MD  estrogens, conjugated, (PREMARIN) 0.625 MG tablet Take 0.625 mg by mouth every morning.    Yes Historical Provider, MD  Fluticasone-Salmeterol (ADVAIR) 100-50 MCG/DOSE AEPB Inhale 1 puff into the lungs as needed (shortness of breath).    Yes Historical Provider, MD  guaiFENesin (MUCINEX) 600 MG 12 hr tablet Take 1 tablet (600 mg total) by mouth 2 (two) times daily. Patient taking differently: Take 600 mg by mouth 2 (two) times daily as needed for cough or to loosen phlegm.  03/25/15  Yes Erin R Barrett, PA-C  insulin aspart (NOVOLOG) 100 UNIT/ML injection Inject 0-15 Units into the skin 3 (three) times daily with meals. Sliding scale with meals Patient taking differently: Inject 15 Units into the skin 3 (three) times daily with meals. Sliding scale with meals 04/02/15  Yes Domenic Polite, MD  insulin glargine (LANTUS) 100 UNIT/ML injection Inject 0.55 mLs (55 Units total) into the skin at bedtime. 04/02/15  Yes Domenic Polite, MD  levothyroxine (SYNTHROID, LEVOTHROID) 200 MCG tablet Take 200 mcg by mouth daily before breakfast.   Yes Historical Provider, MD  meclizine (ANTIVERT) 25 MG tablet Take 25 mg by mouth 3 (three) times daily as needed for dizziness.   Yes Historical Provider, MD  metFORMIN (GLUCOPHAGE-XR) 500 MG 24 hr tablet Take 1,000 mg by mouth 2 (two) times daily.   Yes Historical Provider, MD  ondansetron (ZOFRAN) 4 MG tablet Take 4 mg by mouth every 8 (eight) hours as needed for nausea or vomiting.   Yes Historical Provider, MD  oxyCODONE (OXY IR/ROXICODONE) 5 MG immediate release tablet Take 1-2 tablets (5-10 mg total) by mouth every 4 (four) hours as needed for severe pain. 04/19/15  Yes Gaye Pollack, MD  Pirfenidone (ESBRIET) 267 MG CAPS Days 1-7: 1 cap three  times daily.  Days 8-14: 2 caps three times daily, Days 15 onward: 3 caps three times daily. 04/09/15  Yes Tammy S Parrett, NP  predniSONE (DELTASONE) 10 MG tablet Take 40 mg daily for 3 days, then 30 mg daily for next 3 days, then 20 mg daily for next 3 days and then 10 daily Patient taking differently: Take 10 mg by mouth every other day. Take 40 mg daily for 3 days, then 30 mg daily for next 3 days, then 20 mg daily for next 3 days and then 10 daily 04/24/15  Yes Nishant Dhungel, MD  topiramate (TOPAMAX) 25 MG tablet Take 25 mg by mouth 2 (two) times daily.   Yes Historical Provider, MD  traZODone (DESYREL) 150 MG tablet Take 75 mg by mouth at bedtime.   Yes Historical Provider, MD  vitamin E 400 UNIT capsule Take 400 Units by mouth 2 (two) times daily.    Yes Historical Provider, MD  ALPRAZolam Duanne Moron) 0.5 MG tablet Take 0.5 mg by mouth daily as needed for anxiety.     Historical Provider, MD  furosemide (LASIX) 40 MG tablet Take 1 tablet (40 mg total) by mouth daily. Patient not taking: Reported on 05/05/2015 04/25/15   Louellen Molder, MD   Allergies  Allergen Reactions  . Peanut-Containing Drug Products Shortness Of Breath  Pecans, walnuts as well  . Peanuts [Peanut Oil] Shortness Of Breath  . Sulfa Antibiotics Hives and Shortness Of Breath  . Betadine [Povidone Iodine] Rash  . Latex Rash    FAMILY HISTORY:  indicated that her mother is deceased. She indicated that her father is alive.  SOCIAL HISTORY:  reports that she has never smoked. She has never used smokeless tobacco. She reports that she drinks alcohol. She reports that she does not use illicit drugs.  REVIEW OF SYSTEMS:   General: + recent fever/chills HENT: no nasal congestion, HA, sore throat Pulm: marked progressive SOB, + non-productive cough, SOB w/ any exertion Card: no current CP, no palps. Some LE swelling Abd: no N/V/D. GU: no freq/hesistency/burning Neuro: no HA, + dizziness.   SUBJECTIVE: + sob w/ any exertion and  anxiety  VITAL SIGNS: Temp:  [97.9 F (36.6 C)-98.4 F (36.9 C)] 98.4 F (36.9 C) (06/28 0700) Pulse Rate:  [82-114] 105 (06/28 0900) Resp:  [23-51] 41 (06/28 0900) BP: (92-143)/(52-79) 123/75 mmHg (06/28 0900) SpO2:  [90 %-98 %] 97 % (06/28 0900) FiO2 (%):  [90 %-100 %] 100 % (06/28 0818) HEMODYNAMICS:   VENTILATOR SETTINGS: Vent Mode:  [-]  FiO2 (%):  [90 %-100 %] 100 % INTAKE / OUTPUT:  Intake/Output Summary (Last 24 hours) at 05/07/15 1051 Last data filed at 05/07/15 0700  Gross per 24 hour  Intake 917.17 ml  Output   2400 ml  Net -1482.83 ml    PHYSICAL EXAMINATION: General:  Obese 54 year old white female, very short of breath + accessory muscle use, speaks in short phrases  Neuro:  Awake, alert, CN II-XII grossly intact HEENT:  [place], no JVD, facial adiposity  Cardiovascular:  Tachy, RRR, no rubs, gallops or murmurs Lungs:  velcro sounding posterior rales, ++ accessory muscle use  Abdomen:  Obese + bowel sounds  Musculoskeletal:  Intact  Skin:  Warm, dry, intact   LABS:  CBC  Recent Labs Lab 05/05/15 1420 05/06/15 0415 05/07/15 0735  WBC 12.7* 13.6* 18.1*  HGB 9.4* 9.8* 10.2*  HCT 29.9* 31.1* 31.7*  PLT 244 236 274   Coag's No results for input(s): APTT, INR in the last 168 hours. BMET  Recent Labs Lab 05/05/15 0940 05/05/15 1420 05/06/15 0415 05/07/15 0735  NA 139  --  140 139  K 3.9  --  3.7 3.6  CL 103  --  109 108  CO2 25  --  23 21*  BUN 19  --  13 22*  CREATININE 1.08* 1.10* 1.05* 1.00  GLUCOSE 75  --  173* 207*   Electrolytes  Recent Labs Lab 05/05/15 0940 05/06/15 0415 05/07/15 0735  CALCIUM 9.1 8.6* 8.3*   Sepsis Markers  Recent Labs Lab 05/05/15 1243 05/06/15 0415 05/06/15 0607 05/06/15 1226  LATICACIDVEN 2.31* 1.1 1.2  --   PROCALCITON  --   --   --  <0.10   ABG  Recent Labs Lab 05/06/15 1005  PHART 7.380  PCO2ART 39.8  PO2ART 67.9*   Liver Enzymes  Recent Labs Lab 05/06/15 0415 05/07/15 0735  AST  19 24  ALT 21 20  ALKPHOS 70 75  BILITOT 0.5 0.5  ALBUMIN 2.7* 2.8*   Cardiac Enzymes No results for input(s): TROPONINI, PROBNP in the last 168 hours. Glucose  Recent Labs Lab 05/05/15 2004 05/06/15 0752 05/06/15 1149 05/06/15 1528 05/06/15 2027 05/07/15 0726  GLUCAP 169* 167* 215* 207* 199* 206*    Imaging No results found.   ASSESSMENT /  PLAN:  PULMONARY OETT A: Acute on chronic hypoxic respiratory failure in setting of acute on chronic pulmonary infiltrates. Favor flare of underlying ILD, although on visit 6/24 had both lasix and pred dosing decreased, also developed fever 6/26 so volume overload and infection also possible etiology or at least contributing factors.  UIP/ILD w/ lymphoid hyperplasia (diagnosed by lung dx may 2016): on 3 liters baseline, recently started on Esbriet.  OSA  P:   Cycle high flow O2 w/ BIPAP  Continue IV lasix PCT algo to limit ABX exposure Escalate IV methlpred dose Consider repeat echo Prn morphine for SOB  CARDIOVASCULAR CVL A:  Grade I diastolic HF  Probable element of pulmonary edema  P:  Tele  BP control   RENAL A:   Active diuresis  P:  Continue IV lasix for net negative of 1-2 liters  Lyte replacement Strict I&O Continue foley catheter for now    GASTROINTESTINAL A:   Obesity  P:   Cl liq for now given work of breathing  Monitor intake closely d/t SOB  HEMATOLOGIC A:   Anemia of chronic disease P:  Trend cbc [place] heparin for dvt prophylaxix  INFECTIOUS A:   R/o HCAP  P:   Blood cx 6/26>>>NGTD Urine cx 6/26>>>consider recollection u strep 6/28>>> u legionella 6/28>>>  fortaz 6/26>>> vanc 6/26>>>6/28  ENDOCRINE A:   Dm type II w/ Hyperglycemia   Hypothyroidism  P:   ssi protocol   NEUROLOGIC A:   Anxiety  P:   Addition of prn versed for anxiety RASS goal: 0 Supportive care    FAMILY  - Updates: Updated pt at bedside. She states she does not wish to be intubated or for CPR to be  performed in the case of cardiopulmonary arrest.   - Inter-disciplinary family meet or Palliative Care meeting due by: 7/2    TODAY'S SUMMARY:  Not clear if this is IPF flare, infection, or volume overload. Could be a mix of all three. We have narrowed Abx specturm. She will  Need to continue to intermittently cycle Bipap and [place] . Pt has decided upon DNR/DNI status. Escalation of IV steriods. PRN medications added for anxiety.    STAFF NOTE: I, Merrie Roof, MD FACP have personally reviewed patient's available data, including medical history, events of note, physical examination and test results as part of my evaluation. I have discussed with resident/NP and other care providers such as pharmacist, RN and RRT. In addition, I personally evaluated patient and elicited key findings of: still with increase WOB, crackles dry diffuse, minimal edema, she is tearful and does not want ett, see below, requires mandatory NIMV scheduled, increase steroids slight, d/w Dr Chase Caller, responded well in past, 48 hr no cultures pos, dc vanc, keep ceftaz, lasix continue to neg baalnce, all films, ct reviewed in past, hope this is a flare that responds to steroids again, wbc from steroids and hemoconcetration, anxiety treat versed low dose The patient is critically ill with multiple organ systems failure and requires high complexity decision making for assessment and support, frequent evaluation and titration of therapies, application of advanced monitoring technologies and extensive interpretation of multiple databases.   Critical Care Time devoted to patient care services described in this note is 30 Minutes. This time reflects time of care of this signee: Merrie Roof, MD FACP. This critical care time does not reflect procedure time, or teaching time or supervisory time of PA/NP/Med student/Med Resident etc but could involve care discussion time. Rest per NP/medical  resident whose note is outlined above  and that I agree with   Lavon Paganini. Titus Mould, [place] Pgr: [place] [facility] 05/07/2015 11:35 AM  I have had extensive discussions with pt. We discussed patients current circumstances and organ failures. We also discussed patient's prior wishes under circumstances such as this. pt has decided to NOT perform resuscitation if arrest but to continue current medical support for now. NO ett, no heroics, continued other treatments  Lavon Paganini. Titus Mould, MD, [place] Pgr: [place] [facility]

## 2015-05-08 ENCOUNTER — Inpatient Hospital Stay (HOSPITAL_COMMUNITY): Payer: 59

## 2015-05-08 DIAGNOSIS — I10 Essential (primary) hypertension: Secondary | ICD-10-CM

## 2015-05-08 DIAGNOSIS — J189 Pneumonia, unspecified organism: Secondary | ICD-10-CM | POA: Insufficient documentation

## 2015-05-08 LAB — BASIC METABOLIC PANEL
Anion gap: 13 (ref 5–15)
BUN: 30 mg/dL — ABNORMAL HIGH (ref 6–20)
CALCIUM: 8.3 mg/dL — AB (ref 8.9–10.3)
CHLORIDE: 104 mmol/L (ref 101–111)
CO2: 25 mmol/L (ref 22–32)
CREATININE: 1.25 mg/dL — AB (ref 0.44–1.00)
GFR calc non Af Amer: 48 mL/min — ABNORMAL LOW (ref >60)
GFR, EST AFRICAN AMERICAN: 55 mL/min — AB (ref >60)
Glucose, Bld: 250 mg/dL — ABNORMAL HIGH (ref 65–99)
Potassium: 3.7 mmol/L (ref 3.5–5.1)
Sodium: 142 mmol/L (ref 135–145)

## 2015-05-08 LAB — CBC
HCT: 31 % — ABNORMAL LOW (ref 36.0–46.0)
HEMOGLOBIN: 9.8 g/dL — AB (ref 12.0–15.0)
MCH: 27.3 pg (ref 26.0–34.0)
MCHC: 31.6 g/dL (ref 30.0–36.0)
MCV: 86.4 fL (ref 78.0–100.0)
Platelets: 293 10*3/uL (ref 150–400)
RBC: 3.59 MIL/uL — ABNORMAL LOW (ref 3.87–5.11)
RDW: 15.2 % (ref 11.5–15.5)
WBC: 16.5 10*3/uL — AB (ref 4.0–10.5)

## 2015-05-08 LAB — URINE CULTURE
Culture: NO GROWTH
SPECIAL REQUESTS: NORMAL

## 2015-05-08 LAB — MAGNESIUM: MAGNESIUM: 1.7 mg/dL (ref 1.7–2.4)

## 2015-05-08 LAB — LEGIONELLA ANTIGEN, URINE

## 2015-05-08 LAB — GLUCOSE, CAPILLARY
Glucose-Capillary: 219 mg/dL — ABNORMAL HIGH (ref 65–99)
Glucose-Capillary: 261 mg/dL — ABNORMAL HIGH (ref 65–99)
Glucose-Capillary: 212 mg/dL — ABNORMAL HIGH (ref 65–99)
Glucose-Capillary: 188 mg/dL — ABNORMAL HIGH (ref 65–99)
Glucose-Capillary: 216 mg/dL — ABNORMAL HIGH (ref 65–99)

## 2015-05-08 LAB — PHOSPHORUS: PHOSPHORUS: 3.8 mg/dL (ref 2.5–4.6)

## 2015-05-08 LAB — PROCALCITONIN: Procalcitonin: 0.86 ng/mL

## 2015-05-08 MED ORDER — INSULIN ASPART 100 UNIT/ML ~~LOC~~ SOLN
2.0000 [IU] | SUBCUTANEOUS | Status: DC
  Administered 2015-05-08: 4 [IU] via SUBCUTANEOUS
  Administered 2015-05-08 (×2): 7 [IU] via SUBCUTANEOUS
  Administered 2015-05-08 – 2015-05-09 (×2): 4 [IU] via SUBCUTANEOUS
  Administered 2015-05-09: 7 [IU] via SUBCUTANEOUS
  Administered 2015-05-09 (×2): 4 [IU] via SUBCUTANEOUS

## 2015-05-08 MED ORDER — INSULIN ASPART 100 UNIT/ML ~~LOC~~ SOLN: 2.0000 [IU] | SUBCUTANEOUS | Status: DC | PRN

## 2015-05-08 MED ORDER — FUROSEMIDE 10 MG/ML IJ SOLN
40.0000 mg | Freq: Once | INTRAMUSCULAR | Status: AC
  Administered 2015-05-08: 40 mg via INTRAVENOUS

## 2015-05-08 MED ORDER — INSULIN GLARGINE 100 UNIT/ML ~~LOC~~ SOLN
40.0000 [IU] | Freq: Every day | SUBCUTANEOUS | Status: DC
  Administered 2015-05-08 – 2015-05-10 (×3): 40 [IU] via SUBCUTANEOUS
  Filled 2015-05-08 (×4): qty 0.4

## 2015-05-08 NOTE — Progress Notes (Signed)
Pt. Requested to come off bipap. Pt. Placed on HFNC.

## 2015-05-08 NOTE — Progress Notes (Signed)
eLink Physician-Brief Progress Note Patient Name: Joann [name] DOB: 1961/07/31 MRN: [id number]  Date of Service  05/08/2015   HPI/Events of Note   Call from [facility] and I am also her primary pulmonary doc. This is now 5th admit - for IPF flare, etc.,just started esbriet. Will be going up to 3 tab tid tomorrow. Yet to start rehab - appt pending   Goals of care  - she definitely wants to be DNI - [place], she WANNTS TO IDEALLY be able to live atleas a few years. She is worrid now that her prognosis is < 1year. I agree with her fears on this given her recurrent admits   - TRANSPLANT is only thing at this stage that can help her achiever her goal given hre recurrent admissions. She has good insurance, soical support and $15K in cash for transplant. However, a) still on steroids - trying to get this off; b) polypharmacy with opiopids, c) obese; d) yet to do rehab     eICU Interventions  Advised tha we will call [facility] and run her situation by them   Intervention Category Intermediate Interventions: Respiratory distress - evaluation and management  [name] 05/08/2015, 8:01 PM

## 2015-05-08 NOTE — Progress Notes (Signed)
Inpatient Diabetes Program Recommendations  AACE/ADA: New Consensus Statement on Inpatient Glycemic Control (2013)  Target Ranges:  Prepandial:   less than 140 mg/dL      Peak postprandial:   less than 180 mg/dL (1-2 hours)      Critically ill patients:  140 - 180 mg/dL   Inpatient Diabetes Program Recommendations Insulin - Basal: consider increasing Lantus to 40 units Correction (SSI): change Novolog to Q4 correction Thank you  Raoul Pitch BSN, RN,CDE Inpatient Diabetes Coordinator [phone] (team pager)

## 2015-05-08 NOTE — Progress Notes (Signed)
Placed pt on bipap for night rest. Tolerating well no issues to report, mouth care was performed by RN.  Will continue to monitor.

## 2015-05-08 NOTE — Progress Notes (Signed)
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 F/b Dr Chase Caller. Admitted w/ acute on chronic resp failure w/ working dx of HCAP vs ILD flare on 6/26. PCCM asked to see on 6/27 when O2 requirements were increasing and pt more hypoxic (see sig events below).   STUDIES:  CT chest 6/27: Known chronic fibrotic changes bilaterally.Diffuse parenchymal infiltrates identified which have increased from previous CT. This again may represent progressive changes of usualinterstitial pneumonia.Stable bilateral hilar and mediastinal lymphadenopathy.  SIGNIFICANT EVENTS: 6/24: seen by MR in routine visit/hospital f/u, lasix decreased d/t dizziness, prednisone decreased to 5mg /d; as Esbriet had been titrated up.  6/26 admitted w/ fever, chills and dyspnea.  6/27: progressive resp failure PCCM asked to see.  6/28: DNR status initatied, Bipap intervals, increased steroids 6/29: decrease to daily lasix, neg 1.4 liters with bump crt  SUBJECTIVE: + sob w/ any exertion and anxiety  VITAL SIGNS: Temp:  [97.5 F (36.4 C)-98.7 F (37.1 C)] 97.5 F (36.4 C) (06/29 0700) Pulse Rate:  [89-121] 105 (06/29 0800) Resp:  [22-51] 34 (06/29 0800) BP: (110-147)/(61-90) 128/74 mmHg (06/29 0800) SpO2:  [88 %-98 %] 93 % (06/29 0800) FiO2 (%):  [60 %-80 %] 60 % (06/29 0733) HEMODYNAMICS:   VENTILATOR SETTINGS: Vent Mode:  [-]  FiO2 (%):  [60 %-80 %] 60 % INTAKE / OUTPUT:  Intake/Output Summary (Last 24 hours) at 05/08/15 0848 Last data filed at 05/08/15 0800  Gross per 24 hour  Intake    520 ml  Output   1875 ml  Net  -1355 ml    PHYSICAL EXAMINATION: General:  Obese 54 year old white female on Bipap,  Neuro:  Awake, alert, CN II-XII  grossly intact HEENT:  [name], no JVD, facial adiposity  Cardiovascular:  Tachy, RRR, no rubs, gallops or murmurs Lungs:  velcro sounding posterior rales, ++ accessory muscle use  Abdomen:  Obese + bowel sounds  Musculoskeletal:  Intact  Skin:  Warm, dry, intact   LABS:  CBC  Recent Labs Lab 05/06/15 0415 05/07/15 0735 05/08/15 0315  WBC 13.6* 18.1* 16.5*  HGB 9.8* 10.2* 9.8*  HCT 31.1* 31.7* 31.0*  PLT 236 274 293   Coag's No results for input(s): APTT, INR in the last 168 hours. BMET  Recent Labs Lab 05/06/15 0415 05/07/15 0735 05/08/15 0315  NA 140 139 142  K 3.7 3.6 3.7  CL 109 108 104  CO2 23 21* 25  BUN 13 22* 30*  CREATININE 1.05* 1.00 1.25*  GLUCOSE 173* 207* 250*   Electrolytes  Recent Labs Lab 05/06/15 0415 05/07/15 0735 05/08/15 0315  CALCIUM 8.6* 8.3* 8.3*  MG  --   --  1.7  PHOS  --   --  3.8   Sepsis Markers  Recent Labs Lab 05/05/15 1243 05/06/15 0415 05/06/15 0607 05/06/15 1226 05/07/15 0735 05/08/15 0315  LATICACIDVEN 2.31* 1.1 1.2  --   --   --   PROCALCITON  --   --   --  <0.10 0.99 0.86   ABG  Recent Labs Lab 05/06/15 1005  PHART 7.380  PCO2ART 39.8  PO2ART 67.9*   Liver Enzymes  Recent Labs Lab 05/06/15 0415 05/07/15 0735  AST 19 24  ALT 21 20  ALKPHOS 70 75  BILITOT 0.5 0.5  ALBUMIN 2.7* 2.8*   Cardiac Enzymes No results for input(s): TROPONINI, PROBNP in the last 168 hours. Glucose  Recent Labs Lab 05/06/15 2027 05/07/15 0726 05/07/15 1145 05/07/15 1549 05/07/15 2135 05/08/15 0728  GLUCAP 199* 206* 200* 180* 219* 261*    Imaging No results found.   ASSESSMENT / PLAN:  PULMONARY OETT A: Acute on chronic hypoxic respiratory failure in setting of acute on chronic pulmonary infiltrates. Favor flare of underlying ILD, although on visit 6/24 had both lasix and pred dosing decreased, also developed fever 6/26 so volume overload and infection also possible etiology or at least contributing factors.   UIP/ILD w/ lymphoid hyperplasia (diagnosed by lung dx may 2016): on 3 liters baseline, recently started on Esbriet.  OSA  P:   Cycle high flow O2 w/ BIPAP  Decrease IV lasix interval PCT algo to limit ABX exposure Continue IV methlpred dose with plan to decrease 6/30 Consider repeat echo? Continue Prn morphine for breathlessness  CARDIOVASCULAR CVL A:  Grade I diastolic HF  Probable element of pulmonary edema  P:  Tele  BP control   RENAL A:   Active diuresis  P:  Continue IV lasix for net negative of 1-2 liters  Lyte replacement Strict I&O D/C foley catheter  GASTROINTESTINAL A:   Obesity  P:   Cl liq for now given work of breathing  Monitor intake closely d/t SOB  HEMATOLOGIC A:   Anemia of chronic disease P:  Trend cbc [place] heparin for dvt prophylaxix  INFECTIOUS A:   R/o HCAP  P:   Blood cx 6/26>>>NGTD 6/29 Urine cx 6/26>>>consider recollection u strep 6/28>>>negative u legionella 6/28>>>  fortaz 6/26>>> vanc 6/26>>>6/28  ENDOCRINE A:   Dm type II w/ Hyperglycemia   Hypothyroidism  P:   ssi protocol  Novolog correction to Q4 hr Increase basal insulin to 40units per recommendation d/t continued hyperglycemia Will monitor as steroid doses decrease   NEUROLOGIC A:   Anxiety  P:   Addition of prn versed for anxiety RASS goal: 0 Supportive care    FAMILY  - Updates: Updated pt at bedside 6/28. She states she does not wish to be intubated or for CPR to be performed in the case of cardiopulmonary arrest.   - Inter-disciplinary family meet or Palliative Care meeting due by: 7/2  STAFF NOTE: I, Merrie Roof, MD FACP have personally reviewed patient's available data, including medical history, events of note, physical examination and test results as part of my evaluation. I have discussed with resident/NP and other care providers such as pharmacist, RN and RRT. In addition, I personally evaluated patient and elicited key findings of:  crackles course remains, some increase rr but improved WOB on NIMV, keep scheduled NIMV for now, pcxr now awaited, keep steroids but reduce in am likely ( has responded to steroids in past), lasix x 1 more dose then dc and chem in am , likely have maximized neg balance, DNR/DNI on bipap to sdu, PCT unimpressive overall, likely can add stop date to ABX soon, consider 7 days (was on pirfenidone, some increase in risk upper resp tract infection, immune status??) , anxiety is improved with versed, calling husband to update now The patient is critically ill with multiple organ systems failure and requires high complexity decision making for assessment and support, frequent evaluation and titration of therapies, application of advanced monitoring technologies and extensive interpretation of multiple databases.  Critical Care Time devoted to patient care services described in this note is30 Minutes. This time reflects time of care of this signee: Merrie Roof, MD FACP. This critical care time does not reflect procedure time, or teaching time or supervisory time of PA/NP/Med student/Med Resident etc but could involve care discussion time. Rest per NP/medical resident whose note is outlined above and that I agree with   Joann [name]. Joann Mould, MD, [place] Pgr: [place] [facility] 05/08/2015 10:18 AM

## 2015-05-09 ENCOUNTER — Telehealth: Payer: Self-pay | Admitting: Internal Medicine

## 2015-05-09 ENCOUNTER — Inpatient Hospital Stay (HOSPITAL_COMMUNITY): Payer: 59

## 2015-05-09 DIAGNOSIS — J9621 Acute and chronic respiratory failure with hypoxia: Secondary | ICD-10-CM

## 2015-05-09 DIAGNOSIS — J849 Interstitial pulmonary disease, unspecified: Secondary | ICD-10-CM

## 2015-05-09 LAB — GLUCOSE, CAPILLARY
GLUCOSE-CAPILLARY: 83 mg/dL (ref 65–99)
Glucose-Capillary: 164 mg/dL — ABNORMAL HIGH (ref 65–99)
Glucose-Capillary: 179 mg/dL — ABNORMAL HIGH (ref 65–99)
Glucose-Capillary: 182 mg/dL — ABNORMAL HIGH (ref 65–99)
Glucose-Capillary: 188 mg/dL — ABNORMAL HIGH (ref 65–99)
Glucose-Capillary: 206 mg/dL — ABNORMAL HIGH (ref 65–99)

## 2015-05-09 LAB — CBC
HCT: 31 % — ABNORMAL LOW (ref 36.0–46.0)
Hemoglobin: 9.7 g/dL — ABNORMAL LOW (ref 12.0–15.0)
MCH: 27.1 pg (ref 26.0–34.0)
MCHC: 31.3 g/dL (ref 30.0–36.0)
MCV: 86.6 fL (ref 78.0–100.0)
PLATELETS: 290 10*3/uL (ref 150–400)
RBC: 3.58 MIL/uL — ABNORMAL LOW (ref 3.87–5.11)
RDW: 15.1 % (ref 11.5–15.5)
WBC: 14.1 10*3/uL — ABNORMAL HIGH (ref 4.0–10.5)

## 2015-05-09 LAB — MAGNESIUM: MAGNESIUM: 2 mg/dL (ref 1.7–2.4)

## 2015-05-09 LAB — BASIC METABOLIC PANEL
Anion gap: 8 (ref 5–15)
BUN: 38 mg/dL — ABNORMAL HIGH (ref 6–20)
CHLORIDE: 104 mmol/L (ref 101–111)
CO2: 27 mmol/L (ref 22–32)
Calcium: 7.9 mg/dL — ABNORMAL LOW (ref 8.9–10.3)
Creatinine, Ser: 1.05 mg/dL — ABNORMAL HIGH (ref 0.44–1.00)
GFR calc Af Amer: >60 mL/min (ref >60)
GFR calc non Af Amer: 59 mL/min — ABNORMAL LOW (ref >60)
GLUCOSE: 179 mg/dL — AB (ref 65–99)
Potassium: 3.7 mmol/L (ref 3.5–5.1)
Sodium: 139 mmol/L (ref 135–145)

## 2015-05-09 LAB — PHOSPHORUS: Phosphorus: 3.5 mg/dL (ref 2.5–4.6)

## 2015-05-09 MED ORDER — INSULIN ASPART 100 UNIT/ML ~~LOC~~ SOLN
2.0000 [IU] | Freq: Three times a day (TID) | SUBCUTANEOUS | Status: DC
  Administered 2015-05-10: 15 [IU] via SUBCUTANEOUS
  Administered 2015-05-10: 4 [IU] via SUBCUTANEOUS
  Administered 2015-05-10: 7 [IU] via SUBCUTANEOUS
  Administered 2015-05-11: 4 [IU] via SUBCUTANEOUS
  Administered 2015-05-11: 7 [IU] via SUBCUTANEOUS
  Administered 2015-05-11: 15 [IU] via SUBCUTANEOUS
  Administered 2015-05-12: 7 [IU] via SUBCUTANEOUS
  Administered 2015-05-12: 11 [IU] via SUBCUTANEOUS
  Administered 2015-05-12: 7 [IU] via SUBCUTANEOUS
  Administered 2015-05-13: 11 [IU] via SUBCUTANEOUS
  Administered 2015-05-13: 7 [IU] via SUBCUTANEOUS
  Administered 2015-05-13: 4 [IU] via SUBCUTANEOUS
  Administered 2015-05-14: 15 [IU] via SUBCUTANEOUS
  Administered 2015-05-14 (×2): 4 [IU] via SUBCUTANEOUS
  Administered 2015-05-15: 3 [IU] via SUBCUTANEOUS
  Administered 2015-05-15: 4 [IU] via SUBCUTANEOUS
  Administered 2015-05-15: 15 [IU] via SUBCUTANEOUS

## 2015-05-09 MED ORDER — METHYLPREDNISOLONE SODIUM SUCC 125 MG IJ SOLR
80.0000 mg | Freq: Two times a day (BID) | INTRAMUSCULAR | Status: DC
  Administered 2015-05-09 – 2015-05-11 (×4): 80 mg via INTRAVENOUS
  Filled 2015-05-09: qty 1.28
  Filled 2015-05-09: qty 2
  Filled 2015-05-09 (×3): qty 1.28

## 2015-05-09 NOTE — Consult Note (Signed)
   [name] CM Inpatient Consult   05/09/2015  Joann [name] [birth year] [id number]   Came to bedside to speak with patient. She was recently assigned to [facility] for community follow up due to her frequent admissions and severity of illness. Support and encouragement offered. Will continue to follow. Made inpatient RNCM aware that [facility] is following.    Marthenia Rolling, MSN-Ed, RN,BSN [facility] Liaison [phone]

## 2015-05-09 NOTE — Progress Notes (Signed)
Pt. Doesn't want to wear BIPAP at this time. Pt. Is currently on a 70% HFNC at 50L. Pt. Is tolerating HFNC well at this time without any complications. BIPAP is in the pt.'s room on standby.

## 2015-05-09 NOTE — Progress Notes (Signed)
Placed pt on bipap for night time rest.  Pt is cpap dependent qhs at home.  Tolerating well will continue to monitor.

## 2015-05-09 NOTE — Telephone Encounter (Signed)
Thanks. Awaiting call from Dr Graylon Good of transplant team. I did talk to [place] coordinator - based on high bMI, inpatuent status, no prior rehab - sopunds doubtful for transplant right now. CLosing message

## 2015-05-09 NOTE — Telephone Encounter (Signed)
Triage/Elise  I know is late in day but [facility] has declined and is in hopsital needing lot of o2. Could you please fax ASAP 05/09/2015 itself to [facility] DEPT at fax [phone] along with formal referral order  Please fax  - All 2016 H&P and dc summaries -  All of MR, TP office notes  - Pathology report in lab of lung biopsy - PFT report - CT report  - Current admission H&P and today's progress notes   I am hoping to talk to the lung transplant team but theyw ant this infor FIRST . Let me know when complete  Thanks  Dr. Brand Males, M.D., [facility] Pulmonary and Critical Care Medicine Staff Physician [place] [facility] Pager: [phone], If no answer or between  15:00h - 7:00h: call [phone]  05/09/2015 3:58 PM

## 2015-05-09 NOTE — Telephone Encounter (Signed)
Order placed and all records faxed to provided number.   Will forward to MR as FYI.

## 2015-05-09 NOTE — Progress Notes (Signed)
eLink Physician-Brief Progress Note Patient Name: Joann [name] DOB: 08-Jul-1961 MRN: [id number]  Date of Service  05/09/2015   HPI/Events of Note   Call from Dr Graylon Good of [place] lung transplant - discussed case. Patient is overall acceptable for transplant but definitely needs BMI </-= 27. O2 needs not an issue. Ideally needs rehab as well  eICU Interventions  Dietary counseling for weight loss  Wean steroids to off   Intervention Category Intermediate Interventions: Respiratory distress - evaluation and management  Lajoyce Tamura 05/09/2015, 6:16 PM

## 2015-05-09 NOTE — Progress Notes (Signed)
Comfortable on BiPAP. Cognition intact  Filed Vitals:   05/09/15 1333 05/09/15 1448 05/09/15 1605 05/09/15 1656  BP:  147/80 136/88   Pulse:  97 101   Temp:    98.4 F (36.9 C)  TempSrc:    Oral  Resp:  24 30   Height:      Weight:      SpO2: 97% 96% 96%    NAD JVP cannot be visualized Bibasilar crackles Reg, no M Obese, soft, +BS Ext warm, no edema  BMET    Component Value Date/Time   NA 139 05/09/2015 0329   NA 136 11/17/2013 1327   K 3.7 05/09/2015 0329   K 4.1 11/17/2013 1327   CL 104 05/09/2015 0329   CL 96* 11/17/2013 1327   CO2 27 05/09/2015 0329   CO2 25 11/17/2013 1327   GLUCOSE 179* 05/09/2015 0329   GLUCOSE 95 11/17/2013 1327   BUN 38* 05/09/2015 0329   BUN 16 11/17/2013 1327   CREATININE 1.05* 05/09/2015 0329   CREATININE 1.24 11/17/2013 1327   CALCIUM 7.9* 05/09/2015 0329   CALCIUM 9.6 11/17/2013 1327   GFRNONAA 59* 05/09/2015 0329   GFRNONAA 50* 11/17/2013 1327   GFRAA >60 05/09/2015 0329   GFRAA 57* 11/17/2013 1327    CBC    Component Value Date/Time   WBC 14.1* 05/09/2015 0329   WBC 9.2 11/17/2013 1327   RBC 3.58* 05/09/2015 0329   RBC 4.76 11/17/2013 1327   HGB 9.7* 05/09/2015 0329   HGB 14.0 11/17/2013 1327   HCT 31.0* 05/09/2015 0329   HCT 41.8 11/17/2013 1327   PLT 290 05/09/2015 0329   PLT 267 11/17/2013 1327   MCV 86.6 05/09/2015 0329   MCV 88 11/17/2013 1327   MCH 27.1 05/09/2015 0329   MCH 29.4 11/17/2013 1327   MCHC 31.3 05/09/2015 0329   MCHC 33.4 11/17/2013 1327   RDW 15.1 05/09/2015 0329   RDW 15.0* 11/17/2013 1327   LYMPHSABS 1.3 04/24/2015 0511   LYMPHSABS 2.1 11/17/2013 1327   MONOABS 0.2 04/24/2015 0511   MONOABS 0.8 11/17/2013 1327   EOSABS 0.5 04/24/2015 0511   EOSABS 0.5 11/17/2013 1327   BASOSABS 0.0 04/24/2015 0511   BASOSABS 0.1 11/17/2013 1327    CXR: Possible subtle improvement in severe B infiltrates  IMPRESSION: Acute on chronic hypoxic respiratory failure 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  After 5:30 PM or weekends, call [phone]

## 2015-05-09 NOTE — Progress Notes (Signed)
eLink Physician-Brief Progress Note Patient Name: Joann [name] DOB: 10-Oct-1961 MRN: [id number]  Date of Service  05/09/2015   HPI/Events of Note  Spoke earlier 05/09/2015 to [facility] lung transplatn intake coordinator at [employer],    eICU Interventions  aiwaiting call from an transplant MD to Dr Chase Caller cell next 24h  - have asked pulmonary office to fax all info and make transplant referral to [phone]   Intervention Category Intermediate Interventions: Respiratory distress - evaluation and management  Portia Wisdom 05/09/2015, 3:59 PM

## 2015-05-10 ENCOUNTER — Inpatient Hospital Stay (HOSPITAL_COMMUNITY): Payer: 59

## 2015-05-10 LAB — GLUCOSE, CAPILLARY
GLUCOSE-CAPILLARY: 151 mg/dL — AB (ref 65–99)
Glucose-Capillary: 228 mg/dL — ABNORMAL HIGH (ref 65–99)
Glucose-Capillary: 169 mg/dL — ABNORMAL HIGH (ref 65–99)
Glucose-Capillary: 329 mg/dL — ABNORMAL HIGH (ref 65–99)

## 2015-05-10 LAB — CULTURE, BLOOD (ROUTINE X 2)
Culture: NO GROWTH
Culture: NO GROWTH

## 2015-05-10 NOTE — Progress Notes (Signed)
  eLink Physician-Brief Progress Note Patient Name: INETTE DOUBRAVA DOB: 02-Sep-1961 MRN: [id number]  Date of Service  05/10/2015   HPI/Events of Note   Still expressing desire of wanting to live atleast a few years  Spoke to patient over phone - [facility] is asking for BMI < /27 - which is 133 approximated to 135 pounds or less for transplant eligibility . Current weight is 181 pounds. She feels defeated by this information    eICU Interventions  Encouraged dietary and PT input - might need SNF rehab  Hopeful for some luck that she will survive current IPF flare  Meanwhile encouraged her to maintain aggressive attitude towards weight loss and physical conditioning  All a tall order to achieve - 30day mortality is very high due to this IPF flare   Bedside MD to consider palliative care consult for general support, family support and advanced care planning   Intervention Category Intermediate Interventions: Respiratory distress - evaluation and management  Fionn Stracke 05/10/2015, 4:01 PM

## 2015-05-10 NOTE — Plan of Care (Signed)
Problem: Food- and Nutrition-Related Knowledge Deficit (NB-1.1) Goal: Nutrition education Formal process to instruct or train a patient/client in a skill or to impart knowledge to help patients/clients voluntarily manage or modify food choices and eating behavior to maintain or improve health. Outcome: Adequate for Discharge  RD consulted for nutrition education regarding weight loss. MD has requested RD to counsel on weight loss strategies to better qualify her for transplant  Body mass index is 37.83 kg/(m^2). Pt meets criteria for obesity, class II based on current BMI.  Spoke with pt, who reports "I feel like I can't lose weight no matter what I eat". She reports that she has made some lifestyle changes within the last 3 months, including decreasing her salt and sugar intake and using an air fryer to prepare foods. She also reports she has increased her consumption of fish and greek yogurt to provide adequate protein, as she does not consume a lot of meat. Pt has multiple barriers to weight loss including steroids use, stress/rencent loss (pt revealed she has lost 3 family members within the past few years, including her father who passed away 3 months ago and her uncle who passed away 2 weeks ago), and limited ability to exercise. She reports that she hopes to attend pulmonary rehab at discharge to help with her mobility. Encouraged pt to enjoy small successes and goals to help her achieve long term goals.   Pt is also followed by [facility]. Encouraged her to continued with this program to provide additional support.   RD provided "Weight Loss Tips" handout from the Academy of [employer]. Emphasized the importance of serving sizes and provided examples of correct portions of common foods. Discussed importance of controlled and consistent intake throughout the day. Provided examples of ways to balance meals/snacks and encouraged intake of high-fiber, whole grain complex  carbohydrates. Emphasized the importance of hydration with calorie-free beverages and limiting sugar-sweetened beverages. Encouraged pt to discuss physical activity options with physician. Teach back method used.  Expect fair to good compliance.  Current diet order is Heart Healthy/ Carb Modified, patient is consuming approximately 75% of meals at this time. Labs and medications reviewed. No further nutrition interventions warranted at this time. RD contact information provided. If additional nutrition issues arise, please re-consult RD.  Orvell Careaga A. Jimmye Norman, RD, LDN, CDE Pager: [phone] After hours Pager: [phone]

## 2015-05-10 NOTE — Progress Notes (Signed)
Comfortable on high flow [place] O2. Cognition intact. No new complaints  Filed Vitals:   05/10/15 1500 05/10/15 1600 05/10/15 1616 05/10/15 1715  BP:    126/73  Pulse: 94 101  90  Temp:    98.4 F (36.9 C)  TempSrc:    Oral  Resp: 27 37  30  Height:      Weight:      SpO2: 100% 98% 91% 92%   NAD JVP cannot be visualized Bibasilar crackles Reg, no M Obese, soft, +BS Ext warm, no edema  BMET    Component Value Date/Time   NA 139 05/09/2015 0329   NA 136 11/17/2013 1327   K 3.7 05/09/2015 0329   K 4.1 11/17/2013 1327   CL 104 05/09/2015 0329   CL 96* 11/17/2013 1327   CO2 27 05/09/2015 0329   CO2 25 11/17/2013 1327   GLUCOSE 179* 05/09/2015 0329   GLUCOSE 95 11/17/2013 1327   BUN 38* 05/09/2015 0329   BUN 16 11/17/2013 1327   CREATININE 1.05* 05/09/2015 0329   CREATININE 1.24 11/17/2013 1327   CALCIUM 7.9* 05/09/2015 0329   CALCIUM 9.6 11/17/2013 1327   GFRNONAA 59* 05/09/2015 0329   GFRNONAA 50* 11/17/2013 1327   GFRAA >60 05/09/2015 0329   GFRAA 57* 11/17/2013 1327    CBC    Component Value Date/Time   WBC 14.1* 05/09/2015 0329   WBC 9.2 11/17/2013 1327   RBC 3.58* 05/09/2015 0329   RBC 4.76 11/17/2013 1327   HGB 9.7* 05/09/2015 0329   HGB 14.0 11/17/2013 1327   HCT 31.0* 05/09/2015 0329   HCT 41.8 11/17/2013 1327   PLT 290 05/09/2015 0329   PLT 267 11/17/2013 1327   MCV 86.6 05/09/2015 0329   MCV 88 11/17/2013 1327   MCH 27.1 05/09/2015 0329   MCH 29.4 11/17/2013 1327   MCHC 31.3 05/09/2015 0329   MCHC 33.4 11/17/2013 1327   RDW 15.1 05/09/2015 0329   RDW 15.0* 11/17/2013 1327   LYMPHSABS 1.3 04/24/2015 0511   LYMPHSABS 2.1 11/17/2013 1327   MONOABS 0.2 04/24/2015 0511   MONOABS 0.8 11/17/2013 1327   EOSABS 0.5 04/24/2015 0511   EOSABS 0.5 11/17/2013 1327   BASOSABS 0.0 04/24/2015 0511   BASOSABS 0.1 11/17/2013 1327    CXR: NSC severe B infiltrates  IMPRESSION: Acute on chronic hypoxic respiratory failure UIP/ILD w/ lymphoid hyperplasia  (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 service Mobile [phone].  After 5:30 PM or weekends, call [phone]

## 2015-05-11 LAB — BASIC METABOLIC PANEL
Anion gap: 6 (ref 5–15)
BUN: 26 mg/dL — ABNORMAL HIGH (ref 6–20)
CALCIUM: 8.5 mg/dL — AB (ref 8.9–10.3)
CO2: 25 mmol/L (ref 22–32)
Chloride: 107 mmol/L (ref 101–111)
Creatinine, Ser: 0.85 mg/dL (ref 0.44–1.00)
GFR calc non Af Amer: >60 mL/min (ref >60)
GLUCOSE: 208 mg/dL — AB (ref 65–99)
Potassium: 4.8 mmol/L (ref 3.5–5.1)
SODIUM: 138 mmol/L (ref 135–145)

## 2015-05-11 LAB — GLUCOSE, CAPILLARY
GLUCOSE-CAPILLARY: 207 mg/dL — AB (ref 65–99)
Glucose-Capillary: 176 mg/dL — ABNORMAL HIGH (ref 65–99)
Glucose-Capillary: 367 mg/dL — ABNORMAL HIGH (ref 65–99)
Glucose-Capillary: 209 mg/dL — ABNORMAL HIGH (ref 65–99)

## 2015-05-11 LAB — CBC
HCT: 30.3 % — ABNORMAL LOW (ref 36.0–46.0)
Hemoglobin: 9.6 g/dL — ABNORMAL LOW (ref 12.0–15.0)
MCH: 27.3 pg (ref 26.0–34.0)
MCHC: 31.7 g/dL (ref 30.0–36.0)
MCV: 86.1 fL (ref 78.0–100.0)
Platelets: 281 10*3/uL (ref 150–400)
RBC: 3.52 MIL/uL — ABNORMAL LOW (ref 3.87–5.11)
RDW: 14.8 % (ref 11.5–15.5)
WBC: 14 10*3/uL — AB (ref 4.0–10.5)

## 2015-05-11 MED ORDER — INSULIN GLARGINE 100 UNIT/ML ~~LOC~~ SOLN
43.0000 [IU] | Freq: Every day | SUBCUTANEOUS | Status: DC
  Administered 2015-05-11: 43 [IU] via SUBCUTANEOUS
  Filled 2015-05-11 (×2): qty 0.43

## 2015-05-11 MED ORDER — METHYLPREDNISOLONE SODIUM SUCC 125 MG IJ SOLR
60.0000 mg | Freq: Two times a day (BID) | INTRAMUSCULAR | Status: DC
  Administered 2015-05-11 – 2015-05-12 (×2): 60 mg via INTRAVENOUS
  Filled 2015-05-11 (×2): qty 0.96

## 2015-05-11 MED ORDER — FUROSEMIDE 10 MG/ML IJ SOLN
40.0000 mg | Freq: Two times a day (BID) | INTRAMUSCULAR | Status: DC
  Administered 2015-05-11 (×2): 40 mg via INTRAVENOUS
  Filled 2015-05-11 (×4): qty 4

## 2015-05-11 NOTE — Progress Notes (Signed)
Comfortable on high flow [place] O2.texting now  Filed Vitals:   05/11/15 0406 05/11/15 0834 05/11/15 0935 05/11/15 0938  BP:  137/86 137/85   Pulse:  81    Temp: 98.1 F (36.7 C) 98 F (36.7 C)    TempSrc: Oral Oral    Resp:  35    Height:      Weight:      SpO2:  96%  95%   Mild increase RR JVP cannot be visualized, obese Bibasilar crackles uncnhanged Reg, no M Obese, soft, +BS Ext warm, no edema  BMET    Component Value Date/Time   NA 138 05/11/2015 0243   NA 136 11/17/2013 1327   K 4.8 05/11/2015 0243   K 4.1 11/17/2013 1327   CL 107 05/11/2015 0243   CL 96* 11/17/2013 1327   CO2 25 05/11/2015 0243   CO2 25 11/17/2013 1327   GLUCOSE 208* 05/11/2015 0243   GLUCOSE 95 11/17/2013 1327   BUN 26* 05/11/2015 0243   BUN 16 11/17/2013 1327   CREATININE 0.85 05/11/2015 0243   CREATININE 1.24 11/17/2013 1327   CALCIUM 8.5* 05/11/2015 0243   CALCIUM 9.6 11/17/2013 1327   GFRNONAA >60 05/11/2015 0243   GFRNONAA 50* 11/17/2013 1327   GFRAA >60 05/11/2015 0243   GFRAA 57* 11/17/2013 1327    CBC    Component Value Date/Time   WBC 14.0* 05/11/2015 0243   WBC 9.2 11/17/2013 1327   RBC 3.52* 05/11/2015 0243   RBC 4.76 11/17/2013 1327   HGB 9.6* 05/11/2015 0243   HGB 14.0 11/17/2013 1327   HCT 30.3* 05/11/2015 0243   HCT 41.8 11/17/2013 1327   PLT 281 05/11/2015 0243   PLT 267 11/17/2013 1327   MCV 86.1 05/11/2015 0243   MCV 88 11/17/2013 1327   MCH 27.3 05/11/2015 0243   MCH 29.4 11/17/2013 1327   MCHC 31.7 05/11/2015 0243   MCHC 33.4 11/17/2013 1327   RDW 14.8 05/11/2015 0243   RDW 15.0* 11/17/2013 1327   LYMPHSABS 1.3 04/24/2015 0511   LYMPHSABS 2.1 11/17/2013 1327   MONOABS 0.2 04/24/2015 0511   MONOABS 0.8 11/17/2013 1327   EOSABS 0.5 04/24/2015 0511   EOSABS 0.5 11/17/2013 1327   BASOSABS 0.0 04/24/2015 0511   BASOSABS 0.1 11/17/2013 1327    CXR: NSC severe B infiltrates Last reviewed  IMPRESSION: Acute on chronic hypoxic respiratory  failure 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) Acute pneumonitis/alveolar filling process - diffuse  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 Titus Mould, MD, [place] Pgr: [place] [facility]

## 2015-05-11 NOTE — Evaluation (Signed)
Physical Therapy Evaluation Patient Details Name: Joann [name] MRN: [id number] DOB: [birth year] Today's Date: 05/11/2015   History of Present Illness  Patient is a 54 yo female admitted 05/05/15 with HCAP, respiratory failure, IPF flair.  PMH:  Interstitial pulmonary fibrosis, DM, Obesity, asthma, CA, depression, HTN  Clinical Impression  Patient presents with problems listed below.  Will benefit from acute PT to maximize functional mobility prior to discharge.  Patient currently on HFNC O2, limiting mobility.  Will initially focus on increasing activity tolerance and strengthening exercises.  Feel patient will need 24 hour assist at d/c.  Recommend SNF for continued therapy at d/c.    Follow Up Recommendations SNF;Supervision/Assistance - 24 hour    Equipment Recommendations  Rolling walker with 5" wheels    Recommendations for Other Services       Precautions / Restrictions Precautions Precautions: Fall Precaution Comments: Patient on high flow O2 via [place] at 35 l/min Restrictions Weight Bearing Restrictions: No      Mobility  Bed Mobility               General bed mobility comments: Patient in chair as PT entered room  Transfers Overall transfer level: Needs assistance Equipment used: 2 person hand held assist Transfers: Sit to/from Stand Sit to Stand: Min assist;+2 safety/equipment         General transfer comment: Verbal cues for hand placement.  Assist to rise to standing and to steady.  Patient stood approximately 4 minutes.  Noted resp rate increase to 46.  Dyspnea 4/4.  Returned to sitting.  Patient able to slow breathing rate back to 23 using pursed lip breathing.  Ambulation/Gait                Stairs            Wheelchair Mobility    Modified Rankin (Stroke Patients Only)       Balance                                             Pertinent Vitals/Pain Pain Assessment: No/denies pain  Resp rate to 46 with standing -  back to 23 with seated rest Patient rates dyspnea 4/4 with standing x4 minutes.    Home Living Family/patient expects to be discharged to:: Private residence Living Arrangements: Spouse/significant other Available Help at Discharge: Family;Friend(s);Available PRN/intermittently (Husband works; son helps prn) Type of Home: House Home Access: Stairs to enter Entrance Stairs-Rails: None Technical brewer of Steps: 5 Home Layout: One level Home Equipment:  (Home O2)      Prior Function Level of Independence: Independent         Comments: Able to prepare meals and complete bathing/dressing.  Reports she has to move slowly due to [place]        Extremity/Trunk Assessment   Upper Extremity Assessment: Generalized weakness           Lower Extremity Assessment: Generalized weakness         Communication   Communication: No difficulties (Difficulty breathing impacts speech)  Cognition Arousal/Alertness: Awake/alert Behavior During Therapy: WFL for tasks assessed/performed Overall Cognitive Status: Within Functional Limits for tasks assessed                      General Comments      Exercises  Assessment/Plan    PT Assessment Patient needs continued PT services  PT Diagnosis Difficulty walking;Generalized weakness   PT Problem List Decreased strength;Decreased activity tolerance;Decreased balance;Decreased mobility;Decreased knowledge of use of DME;Cardiopulmonary status limiting activity;Obesity  PT Treatment Interventions DME instruction;Gait training;Functional mobility training;Therapeutic activities;Therapeutic exercise;Balance training;Patient/family education   PT Goals (Current goals can be found in the Care Plan section) Acute Rehab PT Goals Patient Stated Goal: To get stronger PT Goal Formulation: With patient/family Time For Goal Achievement: 05/18/15 Potential to Achieve Goals: Fair    Frequency Min 3X/week    Barriers to discharge Decreased caregiver support Patient does not have 24 hour assist    Co-evaluation               End of Session Equipment Utilized During Treatment: Gait belt;Oxygen (HFNC) Activity Tolerance: Patient limited by fatigue (Limited by dyspnea) Patient left: in chair;with call bell/phone within reach;with family/visitor present Nurse Communication: Mobility status         Time: 1417-1430 PT Time Calculation (min) (ACUTE ONLY): 13 min   Charges:   PT Evaluation $Initial PT Evaluation Tier I: 1 Procedure     PT G CodesDespina Pole 2015-05-29, 7:04 PM Carita Pian. Sanjuana Kava, [place] Pager [phone]

## 2015-05-12 LAB — BASIC METABOLIC PANEL
ANION GAP: 10 (ref 5–15)
BUN: 25 mg/dL — AB (ref 6–20)
CALCIUM: 9 mg/dL (ref 8.9–10.3)
CHLORIDE: 101 mmol/L (ref 101–111)
CO2: 27 mmol/L (ref 22–32)
Creatinine, Ser: 0.99 mg/dL (ref 0.44–1.00)
GFR calc non Af Amer: >60 mL/min (ref >60)
Glucose, Bld: 286 mg/dL — ABNORMAL HIGH (ref 65–99)
POTASSIUM: 4.6 mmol/L (ref 3.5–5.1)
Sodium: 138 mmol/L (ref 135–145)

## 2015-05-12 LAB — GLUCOSE, CAPILLARY
GLUCOSE-CAPILLARY: 265 mg/dL — AB (ref 65–99)
Glucose-Capillary: 235 mg/dL — ABNORMAL HIGH (ref 65–99)
Glucose-Capillary: 211 mg/dL — ABNORMAL HIGH (ref 65–99)
Glucose-Capillary: 265 mg/dL — ABNORMAL HIGH (ref 65–99)

## 2015-05-12 MED ORDER — INSULIN GLARGINE 100 UNIT/ML ~~LOC~~ SOLN
45.0000 [IU] | Freq: Every day | SUBCUTANEOUS | Status: DC
  Administered 2015-05-12: 45 [IU] via SUBCUTANEOUS
  Filled 2015-05-12 (×2): qty 0.45

## 2015-05-12 MED ORDER — FUROSEMIDE 10 MG/ML IJ SOLN
40.0000 mg | Freq: Two times a day (BID) | INTRAMUSCULAR | Status: AC
  Administered 2015-05-12 (×2): 40 mg via INTRAVENOUS

## 2015-05-12 MED ORDER — METHYLPREDNISOLONE SODIUM SUCC 40 MG IJ SOLR
40.0000 mg | Freq: Two times a day (BID) | INTRAMUSCULAR | Status: AC
  Administered 2015-05-12 – 2015-05-14 (×5): 40 mg via INTRAVENOUS
  Filled 2015-05-12 (×6): qty 1

## 2015-05-12 NOTE — Progress Notes (Signed)
In chair, WOB almost to normal, some coughing  Filed Vitals:   05/11/15 2356 05/12/15 0400 05/12/15 0709 05/12/15 0851  BP: 147/83  142/80   Pulse: 79 67 75   Temp: 98.2 F (36.8 C) 98 F (36.7 C) 97.7 F (36.5 C)   TempSrc: Axillary Axillary Oral   Resp: 34 26 30   Height:      Weight:      SpO2: 97% 97% 97% 98%   General: in chair, WOB WNL Neuro:awake, alert, appropriate HEENT: obese PULM: less coarse CV: s1 s2 RRR  No  GI: soft, obese,. Nt, nd, no r Extremities: edema lower   BMET    Component Value Date/Time   NA 138 05/12/2015 0243   NA 136 11/17/2013 1327   K 4.6 05/12/2015 0243   K 4.1 11/17/2013 1327   CL 101 05/12/2015 0243   CL 96* 11/17/2013 1327   CO2 27 05/12/2015 0243   CO2 25 11/17/2013 1327   GLUCOSE 286* 05/12/2015 0243   GLUCOSE 95 11/17/2013 1327   BUN 25* 05/12/2015 0243   BUN 16 11/17/2013 1327   CREATININE 0.99 05/12/2015 0243   CREATININE 1.24 11/17/2013 1327   CALCIUM 9.0 05/12/2015 0243   CALCIUM 9.6 11/17/2013 1327   GFRNONAA >60 05/12/2015 0243   GFRNONAA 50* 11/17/2013 1327   GFRAA >60 05/12/2015 0243   GFRAA 57* 11/17/2013 1327    CBC    Component Value Date/Time   WBC 14.0* 05/11/2015 0243   WBC 9.2 11/17/2013 1327   RBC 3.52* 05/11/2015 0243   RBC 4.76 11/17/2013 1327   HGB 9.6* 05/11/2015 0243   HGB 14.0 11/17/2013 1327   HCT 30.3* 05/11/2015 0243   HCT 41.8 11/17/2013 1327   PLT 281 05/11/2015 0243   PLT 267 11/17/2013 1327   MCV 86.1 05/11/2015 0243   MCV 88 11/17/2013 1327   MCH 27.3 05/11/2015 0243   MCH 29.4 11/17/2013 1327   MCHC 31.7 05/11/2015 0243   MCHC 33.4 11/17/2013 1327   RDW 14.8 05/11/2015 0243   RDW 15.0* 11/17/2013 1327   LYMPHSABS 1.3 04/24/2015 0511   LYMPHSABS 2.1 11/17/2013 1327   MONOABS 0.2 04/24/2015 0511   MONOABS 0.8 11/17/2013 1327   EOSABS 0.5 04/24/2015 0511   EOSABS 0.5 11/17/2013 1327   BASOSABS 0.0 04/24/2015 0511   BASOSABS 0.1 11/17/2013 1327    CXR: NSC severe B  infiltrates Last reviewed  IMPRESSION: Acute on chronic hypoxic respiratory failure 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) Acute pneumonitis/alveolar filling process - diffuse  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 Titus Mould, MD, [place] Pgr: [place] [facility]

## 2015-05-12 NOTE — Progress Notes (Signed)
RT note: Pt on Bipap at this time and not HFNC.  RT monitoring.

## 2015-05-13 ENCOUNTER — Inpatient Hospital Stay (HOSPITAL_COMMUNITY): Payer: 59

## 2015-05-13 LAB — GLUCOSE, CAPILLARY
GLUCOSE-CAPILLARY: 173 mg/dL — AB (ref 65–99)
GLUCOSE-CAPILLARY: 212 mg/dL — AB (ref 65–99)
Glucose-Capillary: 261 mg/dL — ABNORMAL HIGH (ref 65–99)
Glucose-Capillary: 251 mg/dL — ABNORMAL HIGH (ref 65–99)

## 2015-05-13 LAB — BASIC METABOLIC PANEL
Anion gap: 11 (ref 5–15)
BUN: 26 mg/dL — AB (ref 6–20)
CALCIUM: 8.9 mg/dL (ref 8.9–10.3)
CO2: 27 mmol/L (ref 22–32)
Chloride: 95 mmol/L — ABNORMAL LOW (ref 101–111)
Creatinine, Ser: 0.94 mg/dL (ref 0.44–1.00)
Glucose, Bld: 315 mg/dL — ABNORMAL HIGH (ref 65–99)
Potassium: 4.2 mmol/L (ref 3.5–5.1)
SODIUM: 133 mmol/L — AB (ref 135–145)

## 2015-05-13 LAB — MAGNESIUM: Magnesium: 1.7 mg/dL (ref 1.7–2.4)

## 2015-05-13 LAB — PHOSPHORUS: Phosphorus: 3.8 mg/dL (ref 2.5–4.6)

## 2015-05-13 MED ORDER — FUROSEMIDE 10 MG/ML IJ SOLN
40.0000 mg | Freq: Two times a day (BID) | INTRAMUSCULAR | Status: AC
  Administered 2015-05-13 (×2): 40 mg via INTRAVENOUS
  Filled 2015-05-13 (×2): qty 4

## 2015-05-13 MED ORDER — INSULIN GLARGINE 100 UNIT/ML ~~LOC~~ SOLN
50.0000 [IU] | Freq: Every day | SUBCUTANEOUS | Status: DC
  Administered 2015-05-13: 50 [IU] via SUBCUTANEOUS
  Filled 2015-05-13 (×2): qty 0.5

## 2015-05-13 MED ORDER — MAGNESIUM SULFATE 50 % IJ SOLN
2.0000 g | Freq: Once | INTRAVENOUS | Status: DC
  Filled 2015-05-13: qty 4

## 2015-05-13 MED ORDER — MAGNESIUM SULFATE 2 GM/50ML IV SOLN
2.0000 g | Freq: Once | INTRAVENOUS | Status: AC
  Administered 2015-05-13: 2 g via INTRAVENOUS
  Filled 2015-05-13: qty 50

## 2015-05-13 MED ORDER — IPRATROPIUM-ALBUTEROL 0.5-2.5 (3) MG/3ML IN SOLN
3.0000 mL | Freq: Four times a day (QID) | RESPIRATORY_TRACT | Status: DC
Start: 2015-05-13 — End: 2015-05-20
  Administered 2015-05-13 – 2015-05-20 (×29): 3 mL via RESPIRATORY_TRACT
  Filled 2015-05-13 (×29): qty 3

## 2015-05-13 NOTE — Progress Notes (Signed)
Physical Therapy Treatment Patient Details Name: Joann [name] MRN: [id number] DOB: 01/13/1961 Today's Date: 05/13/2015    History of Present Illness Patient is a 54 yo female admitted 05/05/15 with HCAP, respiratory failure, IPF flair.  PMH:  Interstitial pulmonary fibrosis, DM, Obesity, asthma, CA, depression, HTN    PT Comments    Pt admitted with above diagnosis. Pt currently with functional limitations due to balance and endurance deficits. Pt limited by incr RR and desat with activity.  Continue as able.   Pt will benefit from skilled PT to increase their independence and safety with mobility to allow discharge to the venue listed below.    Follow Up Recommendations  SNF;Supervision/Assistance - 24 hour     Equipment Recommendations  Rolling walker with 5" wheels    Recommendations for Other Services       Precautions / Restrictions Precautions Precautions: Fall Precaution Comments: Patient on high flow O2 via  at 30 l/min Restrictions Weight Bearing Restrictions: No    Mobility  Bed Mobility Overal bed mobility: Independent                Transfers Overall transfer level: Needs assistance Equipment used: 2 person hand held assist Transfers: Sit to/from Stand Sit to Stand: Min assist;+2 safety/equipment         General transfer comment: Verbal cues for hand placement.  Assist to rise to standing and to steady.  Pt was able to take a few steps to chair.   Ambulation/Gait Ambulation/Gait assistance: Min assist;+2 safety/equipment Ambulation Distance (Feet): 4 Feet Assistive device: 2 person hand held assist Gait Pattern/deviations: Step-through pattern;Decreased stride length   Gait velocity interpretation: Below normal speed for age/gender General Gait Details: Pt able to take a few steps.  Did need bil UE support.     Stairs            Wheelchair Mobility    Modified Rankin (Stroke Patients Only)       Balance Overall balance  assessment: Needs assistance Sitting-balance support: No upper extremity supported;Feet supported Sitting balance-Leahy Scale: Fair     Standing balance support: Bilateral upper extremity supported;During functional activity Standing balance-Leahy Scale: Poor Standing balance comment: Requires UE support.                     Cognition Arousal/Alertness: Awake/alert Behavior During Therapy: WFL for tasks assessed/performed Overall Cognitive Status: Within Functional Limits for tasks assessed                      Exercises      General Comments        Pertinent Vitals/Pain Pain Assessment: No/denies pain  HR 108-122 bpm, 96% on HFNC 30 L/min with 60% FiO2initially on arrival with O2 drop to 83% with short disstance ambulation and then O2 up to 92% once rested in chair for 2 minutes.  RR 24-45 with activity.     Home Living                      Prior Function            PT Goals (current goals can now be found in the care plan section) Progress towards PT goals: Progressing toward goals    Frequency  Min 3X/week    PT Plan Current plan remains appropriate    Co-evaluation             End of Session Equipment Utilized During Treatment:  Gait belt;Oxygen (HFNC) Activity Tolerance: Patient limited by fatigue (Limited by dyspnea) Patient left: in chair;with call bell/phone within reach;with family/visitor present     Time: 9476-5465 PT Time Calculation (min) (ACUTE ONLY): 10 min  Charges:  $Therapeutic Activity: 8-22 mins                    G CodesDenice Paradise Jun 06, 2015, 1:09 PM [employer] Acute [facility] [phone] (pager)

## 2015-05-13 NOTE — Progress Notes (Signed)
Lower O2 needed, home cpap did well Neg 1.3 liters  Filed Vitals:   05/12/15 2344 05/12/15 2355 05/13/15 0400 05/13/15 0827  BP: 128/84  160/96 174/95  Pulse: 89 88 77 74  Temp: 98.2 F (36.8 C) 98.3 F (36.8 C) 98 F (36.7 C) 98 F (36.7 C)  TempSrc: Oral Oral Axillary Axillary  Resp: 29 32 26 27  Height:      Weight:      SpO2: 95% 97% 88% 93%   General: in bed, speaking ful paragraphs Neuro:awake, alert, appropriate HEENT: obese, jvd down PULM: mild crackles faint dry CV: s1 s2 RRR  No  GI: soft, obese,. Nt, nd, no r Extremities: edema neg   BMET    Component Value Date/Time   NA 133* 05/13/2015 0218   NA 136 11/17/2013 1327   K 4.2 05/13/2015 0218   K 4.1 11/17/2013 1327   CL 95* 05/13/2015 0218   CL 96* 11/17/2013 1327   CO2 27 05/13/2015 0218   CO2 25 11/17/2013 1327   GLUCOSE 315* 05/13/2015 0218   GLUCOSE 95 11/17/2013 1327   BUN 26* 05/13/2015 0218   BUN 16 11/17/2013 1327   CREATININE 0.94 05/13/2015 0218   CREATININE 1.24 11/17/2013 1327   CALCIUM 8.9 05/13/2015 0218   CALCIUM 9.6 11/17/2013 1327   GFRNONAA >60 05/13/2015 0218   GFRNONAA 50* 11/17/2013 1327   GFRAA >60 05/13/2015 0218   GFRAA 57* 11/17/2013 1327    CBC    Component Value Date/Time   WBC 14.0* 05/11/2015 0243   WBC 9.2 11/17/2013 1327   RBC 3.52* 05/11/2015 0243   RBC 4.76 11/17/2013 1327   HGB 9.6* 05/11/2015 0243   HGB 14.0 11/17/2013 1327   HCT 30.3* 05/11/2015 0243   HCT 41.8 11/17/2013 1327   PLT 281 05/11/2015 0243   PLT 267 11/17/2013 1327   MCV 86.1 05/11/2015 0243   MCV 88 11/17/2013 1327   MCH 27.3 05/11/2015 0243   MCH 29.4 11/17/2013 1327   MCHC 31.7 05/11/2015 0243   MCHC 33.4 11/17/2013 1327   RDW 14.8 05/11/2015 0243   RDW 15.0* 11/17/2013 1327   LYMPHSABS 1.3 04/24/2015 0511   LYMPHSABS 2.1 11/17/2013 1327   MONOABS 0.2 04/24/2015 0511   MONOABS 0.8 11/17/2013 1327   EOSABS 0.5 04/24/2015 0511   EOSABS 0.5 11/17/2013 1327   BASOSABS 0.0  04/24/2015 0511   BASOSABS 0.1 11/17/2013 1327    CXR: NSC severe B infiltrates Last reviewed  IMPRESSION: Acute on chronic hypoxic respiratory failure 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 Titus Mould, MD, [place] Pgr: [facility]

## 2015-05-14 DIAGNOSIS — J9620 Acute and chronic respiratory failure, unspecified whether with hypoxia or hypercapnia: Secondary | ICD-10-CM

## 2015-05-14 LAB — BASIC METABOLIC PANEL
Anion gap: 11 (ref 5–15)
BUN: 34 mg/dL — ABNORMAL HIGH (ref 6–20)
CO2: 29 mmol/L (ref 22–32)
Calcium: 9.1 mg/dL (ref 8.9–10.3)
Chloride: 95 mmol/L — ABNORMAL LOW (ref 101–111)
Creatinine, Ser: 0.98 mg/dL (ref 0.44–1.00)
GFR calc Af Amer: >60 mL/min (ref >60)
Glucose, Bld: 260 mg/dL — ABNORMAL HIGH (ref 65–99)
POTASSIUM: 4.2 mmol/L (ref 3.5–5.1)
Sodium: 135 mmol/L (ref 135–145)

## 2015-05-14 LAB — GLUCOSE, CAPILLARY
GLUCOSE-CAPILLARY: 196 mg/dL — AB (ref 65–99)
Glucose-Capillary: 181 mg/dL — ABNORMAL HIGH (ref 65–99)
Glucose-Capillary: 327 mg/dL — ABNORMAL HIGH (ref 65–99)
Glucose-Capillary: 250 mg/dL — ABNORMAL HIGH (ref 65–99)

## 2015-05-14 LAB — MAGNESIUM: Magnesium: 1.9 mg/dL (ref 1.7–2.4)

## 2015-05-14 LAB — PHOSPHORUS: PHOSPHORUS: 4.7 mg/dL — AB (ref 2.5–4.6)

## 2015-05-14 LAB — SEDIMENTATION RATE: Sed Rate: 57 mm/hr — ABNORMAL HIGH (ref 0–22)

## 2015-05-14 MED ORDER — INSULIN GLARGINE 100 UNIT/ML ~~LOC~~ SOLN
55.0000 [IU] | Freq: Every day | SUBCUTANEOUS | Status: DC
  Administered 2015-05-14 – 2015-05-15 (×2): 55 [IU] via SUBCUTANEOUS
  Filled 2015-05-14 (×3): qty 0.55

## 2015-05-14 MED ORDER — MAGNESIUM SULFATE 2 GM/50ML IV SOLN
2.0000 g | Freq: Once | INTRAVENOUS | Status: AC
  Administered 2015-05-14: 2 g via INTRAVENOUS
  Filled 2015-05-14: qty 50

## 2015-05-14 MED ORDER — POLYETHYLENE GLYCOL 3350 17 G PO PACK
17.0000 g | PACK | Freq: Every day | ORAL | Status: DC | PRN
  Administered 2015-05-14 – 2015-05-16 (×2): 17 g via ORAL
  Filled 2015-05-14 (×2): qty 1

## 2015-05-14 MED ORDER — PREDNISONE 20 MG PO TABS
40.0000 mg | ORAL_TABLET | Freq: Every day | ORAL | Status: DC
  Administered 2015-05-15 – 2015-05-20 (×6): 40 mg via ORAL
  Filled 2015-05-14 (×7): qty 2

## 2015-05-14 NOTE — Progress Notes (Signed)
S:   O2 reduced to 30L, pt reports feeling better, minimal sputum production    O:  Filed Vitals:   05/14/15 0400 05/14/15 0759 05/14/15 0817 05/14/15 0824  BP: 116/81   103/65  Pulse: 74 92 100 92  Temp: 97.7 F (36.5 C)  97.5 F (36.4 C)   TempSrc: Oral  Oral   Resp: 23 26 33 26  Height:      Weight:      SpO2: 96% 93%  98%   EXAM: General: in bed, speaking full paragraphs Neuro: awake, alert, appropriate, MAE HEENT: obese, jvd down PULM: mild crackles faint dry CV: s1 s2 RRR  No  GI: soft, obese, NT/ND Extremities: edema neg   BMET    Component Value Date/Time   NA 135 05/14/2015 0327   NA 136 11/17/2013 1327   K 4.2 05/14/2015 0327   K 4.1 11/17/2013 1327   CL 95* 05/14/2015 0327   CL 96* 11/17/2013 1327   CO2 29 05/14/2015 0327   CO2 25 11/17/2013 1327   GLUCOSE 260* 05/14/2015 0327   GLUCOSE 95 11/17/2013 1327   BUN 34* 05/14/2015 0327   BUN 16 11/17/2013 1327   CREATININE 0.98 05/14/2015 0327   CREATININE 1.24 11/17/2013 1327   CALCIUM 9.1 05/14/2015 0327   CALCIUM 9.6 11/17/2013 1327   GFRNONAA >60 05/14/2015 0327   GFRNONAA 50* 11/17/2013 1327   GFRAA >60 05/14/2015 0327   GFRAA 57* 11/17/2013 1327    CBC    Component Value Date/Time   WBC 14.0* 05/11/2015 0243   WBC 9.2 11/17/2013 1327   RBC 3.52* 05/11/2015 0243   RBC 4.76 11/17/2013 1327   HGB 9.6* 05/11/2015 0243   HGB 14.0 11/17/2013 1327   HCT 30.3* 05/11/2015 0243   HCT 41.8 11/17/2013 1327   PLT 281 05/11/2015 0243   PLT 267 11/17/2013 1327   MCV 86.1 05/11/2015 0243   MCV 88 11/17/2013 1327   MCH 27.3 05/11/2015 0243   MCH 29.4 11/17/2013 1327   MCHC 31.7 05/11/2015 0243   MCHC 33.4 11/17/2013 1327   RDW 14.8 05/11/2015 0243   RDW 15.0* 11/17/2013 1327   LYMPHSABS 1.3 04/24/2015 0511   LYMPHSABS 2.1 11/17/2013 1327   MONOABS 0.2 04/24/2015 0511   MONOABS 0.8 11/17/2013 1327   EOSABS 0.5 04/24/2015 0511   EOSABS 0.5 11/17/2013 1327   BASOSABS 0.0 04/24/2015 0511   BASOSABS 0.1 11/17/2013 1327    CXR: 7/4 NSC severe B infiltrates   IMPRESSION: Acute on chronic hypoxic respiratory failure 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) Acute pneumonitis/alveolar filling process - diffuse  Suspect acute exacerbation of IPF/AIP 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

## 2015-05-14 NOTE — Progress Notes (Signed)
Patient transported on NRB and remained stable throughout transport. Patient placed back on HFNC at 30 L/min and 40% FiO2. Respiratory therapist will continue to monitor.

## 2015-05-14 NOTE — Progress Notes (Signed)
Physical Therapy Treatment Patient Details Name: Joann [name] MRN: [id number] DOB: September 21, 1961 Today's Date: 05/14/2015    History of Present Illness Patient is a 54 yo female admitted 05/05/15 with HCAP, respiratory failure, IPF flair.  PMH:  Interstitial pulmonary fibrosis, DM, Obesity, asthma, CA, depression, HTN    PT Comments    Pt admitted with above diagnosis. Pt currently with functional limitations due to endurance deficits. Pt progressing with mobility with less desaturation today with activity.  Needs rest breaks but is improving.  Pt will benefit from skilled PT to increase their independence and safety with mobility to allow discharge to the venue listed below.    Follow Up Recommendations  SNF;Supervision/Assistance - 24 hour     Equipment Recommendations  Rolling walker with 5" wheels    Recommendations for Other Services       Precautions / Restrictions Precautions Precautions: Fall Precaution Comments: Patient on high flow O2 via [place] at 30 l/min Restrictions Weight Bearing Restrictions: No    Mobility  Bed Mobility               General bed mobility comments: Patient in chair as PT entered room  Transfers Overall transfer level: Needs assistance Equipment used: None Transfers: Sit to/from Stand Sit to Stand: Min guard         General transfer comment: Verbal cues for hand placement.  Assist to rise to standing and to steady.   Ambulation/Gait Ambulation/Gait assistance: Min guard Ambulation Distance (Feet): 70 Feet (40 feet, rest break, 30 feet) Assistive device: None Gait Pattern/deviations: Step-through pattern;Decreased stride length   Gait velocity interpretation: Below normal speed for age/gender General Gait Details: Pt ambulating as far as lines would allow.  Did laps in room.  Pt desats and needs rest breaks but overall less desat than yesterday, ambulating further and recovering after walks more quickly.     Stairs             Wheelchair Mobility    Modified Rankin (Stroke Patients Only)       Balance Overall balance assessment: Needs assistance Sitting-balance support: No upper extremity supported;Feet supported Sitting balance-Leahy Scale: Fair     Standing balance support: During functional activity;No upper extremity supported Standing balance-Leahy Scale: Fair Standing balance comment: can stand statically without UE support.                    Cognition Arousal/Alertness: Awake/alert Behavior During Therapy: WFL for tasks assessed/performed Overall Cognitive Status: Within Functional Limits for tasks assessed                      Exercises General Exercises - Upper Extremity Shoulder Flexion: AROM;Both;10 reps;Seated Shoulder ABduction: AROM;Both;5 reps;Seated General Exercises - Lower Extremity Long Arc Quad: AROM;Both;10 reps;Seated Hip Flexion/Marching: AROM;Both;10 reps;Seated    General Comments        Pertinent Vitals/Pain Pain Assessment: No/denies pain  Desat to 81% with 30-40 feet of ambulation after pt sat down staying in 90's while ambulating.  Recovered to >90% within 1-1 1/2 minutes.  With exercises, pt sats stayed in 90's or greater with RR up to 40 with UE exercises.  Pt informed to perform exercises every hour with rest breaks between exercises.  Pt aware to stop if monitor alarms.  Pt agrees.      Home Living                      Prior Function  PT Goals (current goals can now be found in the care plan section) Progress towards PT goals: Progressing toward goals    Frequency  Min 3X/week    PT Plan Current plan remains appropriate    Co-evaluation             End of Session Equipment Utilized During Treatment: Gait belt;Oxygen (HFNC) Activity Tolerance: Patient limited by fatigue (Limited by dyspnea) Patient left: in chair;with call bell/phone within reach     Time: 0939-1002 PT Time Calculation (min) (ACUTE  ONLY): 23 min  Charges:  $Gait Training: 8-22 mins $Therapeutic Exercise: 8-22 mins                    G Codes:      Joann [name], Arrie Aran F Jun 11, 2015, 10:11 AM Joann [name] [facility] [phone] (pager)

## 2015-05-14 NOTE — Progress Notes (Signed)
Cm received ref for ltac eval. Pt has [employer] employee ins. Spoke w [employer] and  has no benefits for ltac. sw ref has been made for snf for rehab.

## 2015-05-15 ENCOUNTER — Inpatient Hospital Stay (HOSPITAL_COMMUNITY): Payer: 59

## 2015-05-15 LAB — GLUCOSE, CAPILLARY
GLUCOSE-CAPILLARY: 148 mg/dL — AB (ref 65–99)
GLUCOSE-CAPILLARY: 327 mg/dL — AB (ref 65–99)
Glucose-Capillary: 181 mg/dL — ABNORMAL HIGH (ref 65–99)
Glucose-Capillary: 335 mg/dL — ABNORMAL HIGH (ref 65–99)

## 2015-05-15 LAB — BASIC METABOLIC PANEL
Anion gap: 9 (ref 5–15)
BUN: 27 mg/dL — ABNORMAL HIGH (ref 6–20)
CO2: 30 mmol/L (ref 22–32)
Calcium: 9.1 mg/dL (ref 8.9–10.3)
Chloride: 97 mmol/L — ABNORMAL LOW (ref 101–111)
Creatinine, Ser: 0.96 mg/dL (ref 0.44–1.00)
GFR calc Af Amer: >60 mL/min (ref >60)
GFR calc non Af Amer: >60 mL/min (ref >60)
GLUCOSE: 251 mg/dL — AB (ref 65–99)
Potassium: 4.2 mmol/L (ref 3.5–5.1)
Sodium: 136 mmol/L (ref 135–145)

## 2015-05-15 LAB — CBC
HCT: 34.6 % — ABNORMAL LOW (ref 36.0–46.0)
Hemoglobin: 11.2 g/dL — ABNORMAL LOW (ref 12.0–15.0)
MCH: 27.7 pg (ref 26.0–34.0)
MCHC: 32.4 g/dL (ref 30.0–36.0)
MCV: 85.4 fL (ref 78.0–100.0)
Platelets: 265 10*3/uL (ref 150–400)
RBC: 4.05 MIL/uL (ref 3.87–5.11)
RDW: 14.9 % (ref 11.5–15.5)
WBC: 14.5 10*3/uL — ABNORMAL HIGH (ref 4.0–10.5)

## 2015-05-15 LAB — PHOSPHORUS: PHOSPHORUS: 4.2 mg/dL (ref 2.5–4.6)

## 2015-05-15 LAB — MAGNESIUM: MAGNESIUM: 1.8 mg/dL (ref 1.7–2.4)

## 2015-05-15 MED ORDER — MAGNESIUM SULFATE 2 GM/50ML IV SOLN
2.0000 g | Freq: Once | INTRAVENOUS | Status: AC
  Administered 2015-05-15: 2 g via INTRAVENOUS
  Filled 2015-05-15: qty 50

## 2015-05-15 MED ORDER — INSULIN ASPART 100 UNIT/ML ~~LOC~~ SOLN
0.0000 [IU] | SUBCUTANEOUS | Status: DC
  Administered 2015-05-15: 5 [IU] via SUBCUTANEOUS

## 2015-05-15 NOTE — Progress Notes (Signed)
S:   O2 reduced to 30L, pt reports feeling better, minimal sputum production . Wants to change codes status to short term ventilaton ok. She is fustrated by need to lose weight but wants to do it as working towards transplant goal   O:  Filed Vitals:   05/15/15 0534  BP: 117/73  Pulse: 69  Temp: 98.6 F (37 C)  Resp: 20   Today's Vitals   05/15/15 0815 05/15/15 0859 05/15/15 0907 05/15/15 0909  BP:      Pulse:      Temp:      TempSrc:      Resp:      Height:      Weight:      SpO2:  98% 98% 97%  PainSc: 0-No pain         EXAM: General: in bed, speaking full paragraphs Neuro: awake, alert, appropriate, MAE HEENT: obese, jvd down PULM: mild crackles faint dry CV: s1 s2 RRR  No  GI: soft, obese, NT/ND Extremities: edema neg   PULMONARY No results for input(s): PHART, PCO2ART, PO2ART, HCO3, TCO2, O2SAT in the last 168 hours.  Invalid input(s): PCO2, PO2  CBC  Recent Labs Lab 05/09/15 0329 05/11/15 0243 05/15/15 0435  HGB 9.7* 9.6* 11.2*  HCT 31.0* 30.3* 34.6*  WBC 14.1* 14.0* 14.5*  PLT 290 281 265    COAGULATION No results for input(s): INR in the last 168 hours.  CARDIAC  No results for input(s): TROPONINI in the last 168 hours. No results for input(s): PROBNP in the last 168 hours.   CHEMISTRY  Recent Labs Lab 05/09/15 0329 05/11/15 0243 05/12/15 0243 05/13/15 0218 05/14/15 0327 05/15/15 0435  NA 139 138 138 133* 135 136  K 3.7 4.8 4.6 4.2 4.2 4.2  CL 104 107 101 95* 95* 97*  CO2 27 25 27 27 29 30   GLUCOSE 179* 208* 286* 315* 260* 251*  BUN 38* 26* 25* 26* 34* 27*  CREATININE 1.05* 0.85 0.99 0.94 0.98 0.96  CALCIUM 7.9* 8.5* 9.0 8.9 9.1 9.1  MG 2.0  --   --  1.7 1.9 1.8  PHOS 3.5  --   --  3.8 4.7* 4.2   Estimated Creatinine Clearance: 60.4 mL/min (by C-G formula based on Cr of 0.96).   LIVER No results for input(s): AST, ALT, ALKPHOS, BILITOT, PROT, ALBUMIN, INR in the last 168 hours.   INFECTIOUS No results for input(s):  LATICACIDVEN, PROCALCITON in the last 168 hours.   ENDOCRINE CBG (last 3)   Recent Labs  05/14/15 2222 05/15/15 0725 05/15/15 1122  GLUCAP 250* 181* 148*         IMAGING x48h  - image(s) personally visualized  -   highlighted in bold Dg Chest Port 1 View  05/15/2015   CLINICAL DATA:  Pulmonary fibrosis.  EXAM: PORTABLE CHEST - 1 VIEW  COMPARISON:  05/13/2015  FINDINGS: Cardiomediastinal silhouette is unchanged, with the heart remaining mildly enlarged. Lung volumes remain diminished with evidence of chronic interstitial disease. Asymmetric left lower lobe opacity does not appear significantly changed. No pleural effusion or pneumothorax is identified. Prior cervical ACDF.  IMPRESSION: Unchanged appearance of the chest with evidence of chronic interstitial disease and asymmetric left lower lobe opacity.   Electronically Signed   By: Logan Bores   On: 05/15/2015 07:35         IMPRESSION:  RESP #BAseline  - family hx of ILD/BOOP (mom)   - IPF with 3L o2 need pre admit -  may 2016 dx - Rx esbriet since June 2016     - Likely OSA   #Current  - Acute on chronic hypoxic respiratory failure Acute pneumonitis/alveolar filling process - diffuse  Suspect acute exacerbation of IPF/AIP   - 05/15/15: 30L high flow [place]. Slowly improving   PLAN: Continue nocturnal CPAP Goal for negative balance Wean off high flow O2 - goal pulse ox > 88% Ambulate as able with pulse ox, up to chair Intermittent CXR Steroids to continue - slow wean  - prednisone 40mg  since 05/15/15 - slow taper over 30 days - hopefully off D/w Dr Lorel Monaco [facility] - transplant - encourage weight loss 50# and  Physical conditioning for possible trasnplant eval  Derm appt as opd for bx of right elbow nodule  ENDOCRINE A: DM - stable with morbid obesity P Lantus 55 units QHS + SSI Monitor electrolytes, replace as indicated    RENAL A: Mild hypomag P 2gm mag sulfate   GI A: obesity P Aggressive weight loss  neeed for transplant possibility  (lung)   GEN APhysical deconditioning P PT consult Await possible SNF rehab   PSYCH A: dpression and anxiety P Continue cymbalta and xanax   GOALS She states given possibilyt of working towards [place] - she wants to change code status to short term vent but still no cpr, no trach/peg     .Dr. Brand Males, M.D., [facility].C.P Pulmonary and Critical Care Medicine Staff Physician [facility] Pager: [phone], If no answer or between  15:00h - 7:00h: call [phone]  05/15/2015 12:54 PM

## 2015-05-15 NOTE — Progress Notes (Addendum)
Brief Nutrition Follow-Up Note  RD received another consult for weight loss. Please refer to nutrition education note on 05/10/15 for further details.  Spoke with pt who feels frustrated about weight loss; she reports she was told she needs to lose 50# in order to qualify for a lung transplant.   Reviewed weight loss principles discussed previously. Provided encouragement to pt to continue to make small, permanent lifestyle changes over time. Also discussed developing small goals to make losing large amounts of weight as a long term goal less burdensome.   Pt asked about Plexus supplement; RD reviewed supplement with pt and discussed importance of making lifestyle changes instead of using the assistance with weight loss supplements (particularly due to potential interactions with medications) in order to slowly and gradually achieve and maintain weight loss.   Pt reports that she is likely going to be discharged to SNF for additional rehab. She reports her biggest barrier to weight loss is mobility; even getting up to the bedside commode is difficult for her.  Pt is on a calorie controlled diet here in the hospital (Heart Healthy/ Carb Modified). Encouraged pt to continue with diet restrictions here in the hospital and continue to refrain from eating outside food to help promote weight loss efforts. Also reminded pt that she will likely be on diet restrictions at her next venue of care, which will help her further achieve her weight loss goals. Pt expressed desire to become more active in order to help with weight loss and is hopeful she can continue her efforts at SNF.   RD will sign off and defer further weight loss interventions to RD at next venue of care. If pt does return home, she may benefit from weight management counseling at [facility] on the outpatient level for additional support. Pt is also active with [facility], which is another excellent resource  available to her.   If further acute nutrition issues arise, please re-consult RD.   Rosario Kushner A. Jimmye Norman, RD, LDN, CDE Pager: [phone] After hours Pager: [phone]

## 2015-05-15 NOTE — Progress Notes (Signed)
Pt not ready for CPAP at this time, RT will place on when ready

## 2015-05-16 LAB — GLUCOSE, CAPILLARY
GLUCOSE-CAPILLARY: 377 mg/dL — AB (ref 65–99)
Glucose-Capillary: 56 mg/dL — ABNORMAL LOW (ref 65–99)
Glucose-Capillary: 110 mg/dL — ABNORMAL HIGH (ref 65–99)
Glucose-Capillary: 53 mg/dL — ABNORMAL LOW (ref 65–99)
Glucose-Capillary: 187 mg/dL — ABNORMAL HIGH (ref 65–99)
Glucose-Capillary: 361 mg/dL — ABNORMAL HIGH (ref 65–99)

## 2015-05-16 LAB — HEPATIC FUNCTION PANEL
ALK PHOS: 82 U/L (ref 38–126)
ALT: 24 U/L (ref 14–54)
AST: 15 U/L (ref 15–41)
Albumin: 2.7 g/dL — ABNORMAL LOW (ref 3.5–5.0)
Bilirubin, Direct: <0.1 mg/dL — ABNORMAL LOW (ref 0.1–0.5)
Total Bilirubin: 0.5 mg/dL (ref 0.3–1.2)
Total Protein: 6.3 g/dL — ABNORMAL LOW (ref 6.5–8.1)

## 2015-05-16 MED ORDER — INSULIN ASPART 100 UNIT/ML ~~LOC~~ SOLN: 0.0000 [IU] | SUBCUTANEOUS | Status: DC

## 2015-05-16 MED ORDER — INSULIN ASPART 100 UNIT/ML ~~LOC~~ SOLN
0.0000 [IU] | Freq: Three times a day (TID) | SUBCUTANEOUS | Status: DC
  Administered 2015-05-16: 2 [IU] via SUBCUTANEOUS
  Administered 2015-05-16: 9 [IU] via SUBCUTANEOUS
  Administered 2015-05-17: 2 [IU] via SUBCUTANEOUS
  Administered 2015-05-17 – 2015-05-18 (×2): 9 [IU] via SUBCUTANEOUS
  Administered 2015-05-18: 2 [IU] via SUBCUTANEOUS
  Administered 2015-05-19: 9 [IU] via SUBCUTANEOUS
  Administered 2015-05-19: 1 [IU] via SUBCUTANEOUS
  Administered 2015-05-20: 9 [IU] via SUBCUTANEOUS

## 2015-05-16 MED ORDER — INSULIN ASPART 100 UNIT/ML ~~LOC~~ SOLN: 1.0000 [IU] | Freq: Three times a day (TID) | SUBCUTANEOUS | Status: DC

## 2015-05-16 MED ORDER — INSULIN GLARGINE 100 UNIT/ML ~~LOC~~ SOLN
45.0000 [IU] | Freq: Every day | SUBCUTANEOUS | Status: DC
  Administered 2015-05-16 – 2015-05-19 (×4): 45 [IU] via SUBCUTANEOUS
  Filled 2015-05-16 (×6): qty 0.45

## 2015-05-16 NOTE — Progress Notes (Signed)
S:   O2 reduced to 30L, pt reports feeling better, minimal sputum production . Wants to change codes status to short term ventilaton ok. She is fustrated by need to lose weight but wants to do it as working towards transplant goal   O:  Filed Vitals:   05/16/15 0442  BP: 103/59  Pulse: 84  Temp: 98.3 F (36.8 C)  Resp:    Today's Vitals   05/16/15 0236 05/16/15 0442 05/16/15 0622 05/16/15 0801  BP:  103/59    Pulse:  84    Temp:  98.3 F (36.8 C)    TempSrc:  Oral    Resp:      Height:      Weight:      SpO2: 95%  95% 96%  PainSc:          EXAM: General: in bed, speaking full paragraphs Neuro: awake, alert, appropriate, MAE HEENT: obese, jvd down PULM: mild crackles faint dry, on high flow O2 CV: s1 s2 RRR  No  GI: soft, obese, NT/ND Extremities: edema neg   PULMONARY No results for input(s): PHART, PCO2ART, PO2ART, HCO3, TCO2, O2SAT in the last 168 hours.  Invalid input(s): PCO2, PO2  CBC  Recent Labs Lab 05/11/15 0243 05/15/15 0435  HGB 9.6* 11.2*  HCT 30.3* 34.6*  WBC 14.0* 14.5*  PLT 281 265    COAGULATION No results for input(s): INR in the last 168 hours.  CARDIAC  No results for input(s): TROPONINI in the last 168 hours. No results for input(s): PROBNP in the last 168 hours.   CHEMISTRY  Recent Labs Lab 05/11/15 0243 05/12/15 0243 05/13/15 0218 05/14/15 0327 05/15/15 0435  NA 138 138 133* 135 136  K 4.8 4.6 4.2 4.2 4.2  CL 107 101 95* 95* 97*  CO2 25 27 27 29 30   GLUCOSE 208* 286* 315* 260* 251*  BUN 26* 25* 26* 34* 27*  CREATININE 0.85 0.99 0.94 0.98 0.96  CALCIUM 8.5* 9.0 8.9 9.1 9.1  MG  --   --  1.7 1.9 1.8  PHOS  --   --  3.8 4.7* 4.2   Estimated Creatinine Clearance: 60.4 mL/min (by C-G formula based on Cr of 0.96).   LIVER  Recent Labs Lab 05/16/15 0247  AST 15  ALT 24  ALKPHOS 82  BILITOT 0.5  PROT 6.3*  ALBUMIN 2.7*     INFECTIOUS No results for input(s): LATICACIDVEN, PROCALCITON in the last  168 hours.   ENDOCRINE CBG (last 3)   Recent Labs  05/16/15 0728 05/16/15 0800 05/16/15 0826  GLUCAP 56* 53* 110*         IMAGING x48h  - Dg Chest Port 1 View  05/15/2015   CLINICAL DATA:  Pulmonary fibrosis.  EXAM: PORTABLE CHEST - 1 VIEW  COMPARISON:  05/13/2015  FINDINGS: Cardiomediastinal silhouette is unchanged, with the heart remaining mildly enlarged. Lung volumes remain diminished with evidence of chronic interstitial disease. Asymmetric left lower lobe opacity does not appear significantly changed. No pleural effusion or pneumothorax is identified. Prior cervical ACDF.  IMPRESSION: Unchanged appearance of the chest with evidence of chronic interstitial disease and asymmetric left lower lobe opacity.   Electronically Signed   By: Logan Bores   On: 05/15/2015 07:35         IMPRESSION:  RESP #Baseline  - family hx of ILD/BOOP (mom)   - IPF with 3L o2 need pre admit - may 2016 dx - Rx esbriet since June 2016     -  Likely OSA   #Current  - Acute on chronic hypoxic respiratory failure Acute pneumonitis/alveolar filling process - diffuse  Suspect acute exacerbation of IPF/AIP   - 05/16/15: 30L high flow [place]. Slowly improving  Intake/Output Summary (Last 24 hours) at 05/16/15 1104 Last data filed at 05/16/15 1053  Gross per 24 hour  Intake    594 ml  Output   1600 ml  Net  -1006 ml     PLAN: Continue nocturnal CPAP Goal for negative balance Wean off high flow O2 - goal pulse ox > 88% Ambulate as able with pulse ox, up to chair Intermittent CXR Steroids to continue - slow wean  - prednisone 40mg  since 05/15/15 - slow taper over 30 days - hopefully off D/w Dr Lorel Monaco [facility] - transplant - encourage weight loss 50# and  Physical conditioning for possible trasnplant eval  Derm appt as opd for bx of right elbow nodule  ENDOCRINE A: DM - stable with morbid obesity P Lantus 45 units QHS + SSI Monitor electrolytes, replace as indicated    RENAL A:  Mild  hypomag P Replete as needed   GI A: obesity P Aggressive weight loss neeed for transplant possibility  (lung)   GEN A: Physical deconditioning P PT consult Await possible SNF rehab   PSYCH A: dpression and anxiety P Continue cymbalta and xanax   GOALS She states given possibility  of working towards [place] - she wants to change code status to short term vent but still no cpr, no trach/peg     Richardson Landry Kalimah Capurro ACNP Maryanna Shape PCCM Pager [phone] till 3 pm If no answer page [phone] 05/16/2015, 11:03 AM

## 2015-05-16 NOTE — Clinical Social Work Placement (Deleted)
   CLINICAL SOCIAL WORK PLACEMENT  NOTE  Date:  05/16/2015  Patient Details  Name: JONAI WEYLAND MRN: [id number] Date of Birth: [birth year]  Clinical Social Work is seeking post-discharge placement for this patient at the [place] level of care (*CSW will initial, date and re-position this form in  chart as items are completed):  Yes   Patient/family provided with [place] Work Department's list of facilities offering this level of care within the geographic area requested by the patient (or if unable, by the patient's family).  Yes   Patient/family informed of their freedom to choose among providers that offer the needed level of care, that participate in Medicare, Medicaid or managed care program needed by the patient, have an available bed and are willing to accept the patient.  Yes   Patient/family informed of [place]'s ownership interest in [facility] and [facility], as well as of the fact that they are under no obligation to receive care at these facilities.  PASRR submitted to EDS on 05/16/15     PASRR number received on 05/16/15     Existing PASRR number confirmed on       FL2 transmitted to all facilities in geographic area requested by pt/family on 05/16/15     FL2 transmitted to all facilities within larger geographic area on       Patient informed that his/her managed care company has contracts with or will negotiate with certain facilities, including the following:        Yes   Patient/family informed of bed offers received.  Patient [facility] bed at [facility]     Physician recommends and patient chooses bed at      Patient to be transferred to [facility] on 05/16/15.  Patient to be transferred to facility by PTAR     Patient family notified on 05/16/15 of transfer.  Name of family member notified:  Pt daughter at bedside     PHYSICIAN       Additional Comment:     _______________________________________________ Berton Mount, [place]

## 2015-05-16 NOTE — Progress Notes (Addendum)
Pt is currently on CPAP at this time and not HFNC. PT tolerating CPAP well O2 sats remain at 96%. Daytime RT will place pt back on HFNC when pt wakes up for the morning.

## 2015-05-16 NOTE — Progress Notes (Signed)
Hypoglycemic Event  CBG: 56 at 0728  Treatment: orange juice  Symptoms: none  Follow-up CBG: Time:  0800 CBG Result:  53, patient given 2 additional cups of orange juice, CBG then came up to 110 at 0826  Possible Reasons for Event: unknown  Comments/MD notified:  Burman Nieves,  NP    Mardi Mainland  Remember to initiate Hypoglycemia Order Set & complete

## 2015-05-16 NOTE — Progress Notes (Signed)
CSW Armed forces technical officer) spoke with [facility]. They are working to try and get appropriate equipment for pt at this time. Admissions office informed CSW that they will begin insurance authorization process.  [place], [place]

## 2015-05-16 NOTE — Clinical Social Work Note (Signed)
Clinical Social Work Assessment  Patient Details  Name: Joann [name] MRN: [id number] Date of Birth: 1961-01-28  Date of referral:  05/16/15               Reason for consult:  Facility Placement                Permission sought to share information with:  Facility Art therapist granted to share information::  Yes, Verbal Permission Granted  Name::        Agency::  SNFs for referral purposes  Relationship::     Contact Information:     Housing/Transportation Living arrangements for the past 2 months:  [place] of Information:  Patient Patient Interpreter Needed:  None Criminal Activity/Legal Involvement Pertinent to Current Situation/Hospitalization:  No - Comment as needed Significant Relationships:  Adult Children, Spouse, Siblings Lives with:  Spouse Do you feel safe going back to the place where you live?  No Need for family participation in patient care:  No (Coment)  Care giving concerns:  Medical team recommending SNF at [facility]   Social Worker assessment / plan:  CSW spoke with pt at bedside about SNF recommendation. Pt vaguely aware of recommendation. CSW explained SNF referral process. CSW did also speak with pt about potential for difficulty with placement due to pt high oxygen use. Pt understanding but hopeful she will not have to go too far from home. After discussion, pt and CSW agreed that referral will be sent to [place] (pt home county), [place], and [place]. CSW to update pt and send referral to additional counties if needed.  Employment status:    Insurance information:  Managed Care PT Recommendations:  [place] / Referral to community resources:  [place]  Patient/Family's Response to care:  Pt agreeable to plan but did state she was anxious about facility location.  Patient/Family's Understanding of and Emotional Response to Diagnosis, Current Treatment, and  Prognosis:  Pt able to verbalize good understanding of her condition. Pt with appropriate emotional response to current condition. Pt expressed she is doing as well as she can given her situation.  Emotional Assessment Appearance:  Appears older than stated age Attitude/Demeanor/Rapport:  Other (Cooperative) Affect (typically observed):  Calm, Pleasant Orientation:  Oriented to Self, Oriented to Place, Oriented to  Time, Oriented to Situation Alcohol / Substance use:  Not Applicable Psych involvement (Current and /or in the community):  No (Comment)  Discharge Needs  Concerns to be addressed:  Discharge Planning Concerns Readmission within the last 30 days:  Yes Current discharge risk:  Dependent with Mobility Barriers to Discharge:  Continued Medical Work up, Other (Limited SNFs that can meet pt needs)  [place], [place]

## 2015-05-16 NOTE — Progress Notes (Addendum)
Burman Nieves, NP notified of patient's hypoglycemic episode this am and need for novolog coverage adjustment.  I also notified him of 7 beats of VTACH this am.

## 2015-05-16 NOTE — Care Management Note (Signed)
Case Management Note  Patient Details  Name: Joann [name] MRN: [id number] Date of Birth: [birth year]  Subjective/Objective: Pt is a transfer from [facility]. Continues on High flow 02.                    Action/Plan: Recommendations for SNF once medically stable for d/c. CM did speak with CSW in ref to disposition needs. CM will continue to monitor.    Expected Discharge Date:                  Expected Discharge Plan:  [place]  In-House Referral:  Clinical Social Work  Discharge planning Services     Post Acute Care Choice:  Resumption of Svcs/PTA Provider Choice offered to:     DME Arranged:    DME Agency:     HH Arranged:  Disease Management [place] Agency:  [place]  Status of Service:  In process, will continue to follow  Medicare Important Message Given:    Date Medicare IM Given:    Medicare IM give by:    Date Additional Medicare IM Given:    Additional Medicare Important Message give by:     If discussed at [place] of Stay Meetings, dates discussed:    Additional Comments:  Bethena Roys, RN 05/16/2015, 3:41 PM

## 2015-05-16 NOTE — Progress Notes (Signed)
Physical Therapy Treatment Patient Details Name: Joann [name] MRN: [id number] DOB: [birth year] Today's Date: 05/16/2015    History of Present Illness Patient is a 54 yo female admitted 05/05/15 with HCAP, respiratory failure, IPF flair.  PMH:  Interstitial pulmonary fibrosis, DM, Obesity, asthma, CA, depression, HTN    PT Comments    RN advised PT to notify  RN to increase high flow oxygen for  Activity. Patient's  sats gradually dropped into 70's  On 40 liters. Patient is very motivated.  Follow Up Recommendations  SNF;Supervision/Assistance - 24 hour     Equipment Recommendations  Rolling walker with 5" wheels    Recommendations for Other Services       Precautions / Restrictions Precautions Precautions: Fall Precaution Comments: Patient on high flow O2 via [place] at 30 l/min, notify RN to increase for activity    Mobility  Bed Mobility Overal bed mobility: Independent                Transfers   Equipment used: None Transfers: Sit to/from [facility] Sit to Stand: Supervision Stand pivot transfers: Supervision       General transfer comment: extra time for SOB  Ambulation/Gait Ambulation/Gait assistance: Min guard           General Gait Details: simulated ambulation with walk in place 10' x 2, sats monitored. gradually decreaased to 82% on 30 Liters high flow, after rest and then transfer to [facility] . RN rushed into room and stated that NP stated to increase  O2 to 50 liters during mobility. patient assisted  back to bed,  Sats gradually dropped  to 72 %, patient rested and  sats increased back to 90 on 40 liters.   Stairs            Wheelchair Mobility    Modified Rankin (Stroke Patients Only)       Balance                                    Cognition Arousal/Alertness: Awake/alert                          Exercises General Exercises - Lower Extremity Long Arc Quad: AROM;Both;10 reps;Seated Hip  ABduction/ADduction: AROM;Both;10 reps;Seated Hip Flexion/Marching: AROM;Both;10 reps;Seated    General Comments        Pertinent Vitals/Pain Pain Assessment: No/denies pain    Home Living                      Prior Function            PT Goals (current goals can now be found in the care plan section) Progress towards PT goals: Progressing toward goals    Frequency       PT Plan Current plan remains appropriate    Co-evaluation             End of Session Equipment Utilized During Treatment: Oxygen Activity Tolerance: Treatment limited secondary to medical complications (Comment) (drop in sats) Patient left: in bed;with call bell/phone within reach     Time: 1145-1209 PT Time Calculation (min) (ACUTE ONLY): 24 min  Charges:  $Therapeutic Activity: 23-37 mins                    G Codes:      Claretha Cooper 05/16/2015, 1:10 PM Santiago Glad  [place] PT [phone]

## 2015-05-16 NOTE — Clinical Social Work Placement (Signed)
   CLINICAL SOCIAL WORK PLACEMENT  NOTE  Date:  05/16/2015  Patient Details  Name: Joann [name] MRN: [id number] Date of Birth: 1961/06/08  Clinical Social Work is seeking post-discharge placement for this patient at the [place] level of care (*CSW will initial, date and re-position this form in  chart as items are completed):  Yes   Patient/family provided with [place] Work Department's list of facilities offering this level of care within the geographic area requested by the patient (or if unable, by the patient's family).  Yes   Patient/family informed of their freedom to choose among providers that offer the needed level of care, that participate in Medicare, Medicaid or managed care program needed by the patient, have an available bed and are willing to accept the patient.  Yes   Patient/family informed of [place]'s ownership interest in [facility] and [facility], as well as of the fact that they are under no obligation to receive care at these facilities.  PASRR submitted to EDS on 05/16/15     PASRR number received on 05/16/15     Existing PASRR number confirmed on       FL2 transmitted to all facilities in geographic area requested by pt/family on 05/16/15     FL2 transmitted to all facilities within larger geographic area on       Patient informed that his/her managed care company has contracts with or will negotiate with certain facilities, including the following:            Patient/family informed of bed offers received.  Patient chooses bed at       Physician recommends and patient chooses bed at      Patient to be transferred to   on  .  Patient to be transferred to facility by       Patient family notified on   of transfer.  Name of family member notified:        PHYSICIAN Please sign FL2     Additional Comment:    _______________________________________________ Berton Mount, [place]

## 2015-05-17 ENCOUNTER — Ambulatory Visit (HOSPITAL_COMMUNITY): Payer: 59

## 2015-05-17 ENCOUNTER — Other Ambulatory Visit: Payer: Self-pay

## 2015-05-17 LAB — GLUCOSE, CAPILLARY
GLUCOSE-CAPILLARY: 148 mg/dL — AB (ref 65–99)
GLUCOSE-CAPILLARY: 85 mg/dL (ref 65–99)
Glucose-Capillary: 267 mg/dL — ABNORMAL HIGH (ref 65–99)
Glucose-Capillary: 185 mg/dL — ABNORMAL HIGH (ref 65–99)
Glucose-Capillary: 401 mg/dL — ABNORMAL HIGH (ref 65–99)
Glucose-Capillary: 375 mg/dL — ABNORMAL HIGH (ref 65–99)

## 2015-05-17 LAB — BASIC METABOLIC PANEL
ANION GAP: 9 (ref 5–15)
BUN: 20 mg/dL (ref 6–20)
CALCIUM: 8.7 mg/dL — AB (ref 8.9–10.3)
CO2: 29 mmol/L (ref 22–32)
Chloride: 98 mmol/L — ABNORMAL LOW (ref 101–111)
Creatinine, Ser: 0.91 mg/dL (ref 0.44–1.00)
GFR calc Af Amer: >60 mL/min (ref >60)
GFR calc non Af Amer: >60 mL/min (ref >60)
Glucose, Bld: 209 mg/dL — ABNORMAL HIGH (ref 65–99)
POTASSIUM: 3.8 mmol/L (ref 3.5–5.1)
SODIUM: 136 mmol/L (ref 135–145)

## 2015-05-17 NOTE — Progress Notes (Signed)
RT titrated flow down from 15 lpm to 10 lpm. Patient's O2 sat dropped from 94 to 91. RT returned flow to 15 lpm, and patient feels more comfortable. RT will continue to monitor.

## 2015-05-17 NOTE — Progress Notes (Signed)
Placed pt on cpap, pt tolerating well at this time.  

## 2015-05-17 NOTE — Progress Notes (Addendum)
CSW (Clinical Education officer, museum) awaiting return phone call from [facility] to confirm facility capabilities for pt oxygen requirements. Working with [employer], pt nurse, and charge nurse on discharge planning.  ADDENDUM: CSW spoke with facility who confirmed they are able to handle pt needs. Charge nurse to speak with MD about dc summary.  ADDENDUM 4:40pm: CSW notified pt will not dc today. CSW notified SNF. SNF confirmed they can accept pt over weekend if pt is medically stable for dc.  [place], [place]

## 2015-05-17 NOTE — Progress Notes (Signed)
Placed pt. On cpap. Pt. Tolerating well at this time. 

## 2015-05-17 NOTE — Patient Outreach (Signed)
[facility]) Care Management  05/17/2015  Joann [name] 09-Jan-1961 [id number]   Spoke to Yelena by phone today regarding recent admission to hospital- she tells me she's been at the hospital for 2 weeks and anticipates she will go to [facility] today. [name] requires high flow oxygen therapy and [name] is one of the few facilities that can meet her needs.  Blood sugars being managed by the Inpatient Glycemic Control Team in conjunction with the hospital medical staff.   Gentry Fitz, RN, BA, [place], [place] Direct Dial:  [phone]  Fax:  [phone] E-mail: [email or web address] [address], [place], [place]  [address]

## 2015-05-17 NOTE — Progress Notes (Signed)
S:   O2 40L, pt reports feeling better, minimal sputum production . Wants to change codes status to short term ventilaton ok. She is fustrated by need to lose weight but wants to do it as working towards transplant goal   O:  Filed Vitals:   05/17/15 0500  BP: 107/71  Pulse: 79  Temp: 98.6 F (37 C)  Resp:    Today's Vitals   05/16/15 2140 05/17/15 0112 05/17/15 0500 05/17/15 0736  BP:   107/71   Pulse:  83 79   Temp:   98.6 F (37 C)   TempSrc:   Oral   Resp:  18    Height:      Weight:      SpO2: 97% 100% 97% 92%  PainSc:          EXAM: General: in bed, speaking full paragraphs, nad Neuro: awake, alert, appropriate, MAE HEENT: obese, jvd down PULM: decreased bs, on high flow O2 CV: s1 s2 RRR  No  GI: soft, obese, NT/ND Extremities: edema neg   PULMONARY No results for input(s): PHART, PCO2ART, PO2ART, HCO3, TCO2, O2SAT in the last 168 hours.  Invalid input(s): PCO2, PO2  CBC  Recent Labs Lab 05/11/15 0243 05/15/15 0435  HGB 9.6* 11.2*  HCT 30.3* 34.6*  WBC 14.0* 14.5*  PLT 281 265    COAGULATION No results for input(s): INR in the last 168 hours.  CARDIAC  No results for input(s): TROPONINI in the last 168 hours. No results for input(s): PROBNP in the last 168 hours.   CHEMISTRY  Recent Labs Lab 05/12/15 0243 05/13/15 0218 05/14/15 0327 05/15/15 0435 05/17/15 0344  NA 138 133* 135 136 136  K 4.6 4.2 4.2 4.2 3.8  CL 101 95* 95* 97* 98*  CO2 27 27 29 30 29   GLUCOSE 286* 315* 260* 251* 209*  BUN 25* 26* 34* 27* 20  CREATININE 0.99 0.94 0.98 0.96 0.91  CALCIUM 9.0 8.9 9.1 9.1 8.7*  MG  --  1.7 1.9 1.8  --   PHOS  --  3.8 4.7* 4.2  --    Estimated Creatinine Clearance: 63.7 mL/min (by C-G formula based on Cr of 0.91).   LIVER  Recent Labs Lab 05/16/15 0247  AST 15  ALT 24  ALKPHOS 82  BILITOT 0.5  PROT 6.3*  ALBUMIN 2.7*     INFECTIOUS No results for input(s): LATICACIDVEN, PROCALCITON in the last 168  hours.   ENDOCRINE CBG (last 3)   Recent Labs  05/17/15 0106 05/17/15 0522 05/17/15 0727  GLUCAP 267* 148* 85         IMAGING x48h  - No results found.       IMPRESSION:  RESP #Baseline  - family hx of ILD/BOOP (mom)   - IPF with 3L o2 need pre admit - may 2016 dx - Rx esbriet since June 2016     - Likely OSA   #Current  - Acute on chronic hypoxic respiratory failure Acute pneumonitis/alveolar filling process - diffuse  Suspect acute exacerbation of IPF/AIP   - 05/16/15: 30L high flow [place]. Slowly improving  Intake/Output Summary (Last 24 hours) at 05/17/15 1114 Last data filed at 05/17/15 0808  Gross per 24 hour  Intake    240 ml  Output   1700 ml  Net  -1460 ml     PLAN: Continue nocturnal CPAP Goal for negative balance Wean off high flow O2 - goal pulse ox > 88% as tolerated  Ambulate as able with pulse ox, up to chair Intermittent CXR Steroids to continue - slow wean  - prednisone 40mg  since 05/15/15 - slow taper over 30 days - hopefully off D/w Dr Lorel Monaco [facility] - transplant - encourage weight loss 50# and  Physical conditioning for possible trasnplant eval  Derm appt as opd for bx of right elbow nodule  ENDOCRINE CBG (last 3)   Recent Labs  05/17/15 0106 05/17/15 0522 05/17/15 0727  GLUCAP 267* 148* 85     A: DM - stable with morbid obesity P Lantus 45 units QHS + SSI(decreased 7/7) Monitor electrolytes, replace as indicated    RENAL A:  Mild hypomag P Replete as needed   GI A: obesity P Aggressive weight loss neeed for transplant possibility  (lung)   GEN A: Physical deconditioning P PT consult Await possible SNF rehab   PSYCH A: dpression and anxiety P Continue cymbalta and xanax   GOALS She states given possibility  of working towards [place] - she wants to change code status to short term vent but still no cpr, no trach/peg.      Richardson Landry Kriste Broman ACNP Maryanna Shape PCCM Pager [phone] till 3 pm If no  answer page [phone] 05/17/2015, 11:14 AM

## 2015-05-18 ENCOUNTER — Inpatient Hospital Stay (HOSPITAL_COMMUNITY): Payer: 59

## 2015-05-18 LAB — RENAL FUNCTION PANEL
Albumin: 2.5 g/dL — ABNORMAL LOW (ref 3.5–5.0)
Anion gap: 9 (ref 5–15)
BUN: 18 mg/dL (ref 6–20)
CO2: 26 mmol/L (ref 22–32)
CREATININE: 0.88 mg/dL (ref 0.44–1.00)
Calcium: 8.4 mg/dL — ABNORMAL LOW (ref 8.9–10.3)
Chloride: 102 mmol/L (ref 101–111)
GFR calc Af Amer: >60 mL/min (ref >60)
GFR calc non Af Amer: >60 mL/min (ref >60)
Glucose, Bld: 170 mg/dL — ABNORMAL HIGH (ref 65–99)
Phosphorus: 3.8 mg/dL (ref 2.5–4.6)
Potassium: 3.6 mmol/L (ref 3.5–5.1)
Sodium: 137 mmol/L (ref 135–145)

## 2015-05-18 LAB — CBC WITH DIFFERENTIAL/PLATELET
BASOS PCT: 0 % (ref 0–1)
Basophils Absolute: 0 10*3/uL (ref 0.0–0.1)
EOS ABS: 0.4 10*3/uL (ref 0.0–0.7)
Eosinophils Relative: 3 % (ref 0–5)
HEMATOCRIT: 32 % — AB (ref 36.0–46.0)
Hemoglobin: 10.1 g/dL — ABNORMAL LOW (ref 12.0–15.0)
Lymphocytes Relative: 27 % (ref 12–46)
Lymphs Abs: 3.6 10*3/uL (ref 0.7–4.0)
MCH: 27.2 pg (ref 26.0–34.0)
MCHC: 31.6 g/dL (ref 30.0–36.0)
MCV: 86.3 fL (ref 78.0–100.0)
MONOS PCT: 7 % (ref 3–12)
Monocytes Absolute: 0.9 10*3/uL (ref 0.1–1.0)
NEUTROS PCT: 63 % (ref 43–77)
Neutro Abs: 8.5 10*3/uL — ABNORMAL HIGH (ref 1.7–7.7)
Platelets: 262 10*3/uL (ref 150–400)
RBC: 3.71 MIL/uL — ABNORMAL LOW (ref 3.87–5.11)
RDW: 15.2 % (ref 11.5–15.5)
WBC: 13.4 10*3/uL — ABNORMAL HIGH (ref 4.0–10.5)

## 2015-05-18 LAB — GLUCOSE, CAPILLARY
GLUCOSE-CAPILLARY: 243 mg/dL — AB (ref 65–99)
GLUCOSE-CAPILLARY: 402 mg/dL — AB (ref 65–99)
Glucose-Capillary: 143 mg/dL — ABNORMAL HIGH (ref 65–99)
Glucose-Capillary: 96 mg/dL (ref 65–99)
Glucose-Capillary: 192 mg/dL — ABNORMAL HIGH (ref 65–99)
Glucose-Capillary: 302 mg/dL — ABNORMAL HIGH (ref 65–99)

## 2015-05-18 NOTE — Progress Notes (Signed)
S:   O2 35L,no new complaints. Stable at present    O:  Filed Vitals:   05/18/15 0503  BP: 107/70  Pulse: 67  Temp: 97.9 F (36.6 C)  Resp:    Today's Vitals   05/18/15 0002 05/18/15 0136 05/18/15 0503 05/18/15 0746  BP:   107/70   Pulse:   67   Temp: 98.2 F (36.8 C)  97.9 F (36.6 C)   TempSrc:      Resp:      Height:      Weight:   81.829 kg (180 lb 6.4 oz)   SpO2:  99% 100% 98%  PainSc:          EXAM: General: in bed, speaking full paragraphs, nad Neuro: awake, alert, appropriate, MAE HEENT: obese, jvd down PULM: decreased bs, on high flow O2 CV: s1 s2 RRR  No  GI: soft, obese, NT/ND Extremities: edema neg   PULMONARY No results for input(s): PHART, PCO2ART, PO2ART, HCO3, TCO2, O2SAT in the last 168 hours.  Invalid input(s): PCO2, PO2  CBC  Recent Labs Lab 05/15/15 0435 05/18/15 0500  HGB 11.2* 10.1*  HCT 34.6* 32.0*  WBC 14.5* 13.4*  PLT 265 262    COAGULATION No results for input(s): INR in the last 168 hours.  CARDIAC  No results for input(s): TROPONINI in the last 168 hours. No results for input(s): PROBNP in the last 168 hours.   CHEMISTRY  Recent Labs Lab 05/13/15 0218 05/14/15 0327 05/15/15 0435 05/17/15 0344 05/18/15 0350  NA 133* 135 136 136 137  K 4.2 4.2 4.2 3.8 3.6  CL 95* 95* 97* 98* 102  CO2 27 29 30 29 26   GLUCOSE 315* 260* 251* 209* 170*  BUN 26* 34* 27* 20 18  CREATININE 0.94 0.98 0.96 0.91 0.88  CALCIUM 8.9 9.1 9.1 8.7* 8.4*  MG 1.7 1.9 1.8  --   --   PHOS 3.8 4.7* 4.2  --  3.8   Estimated Creatinine Clearance: 66.8 mL/min (by C-G formula based on Cr of 0.88).   LIVER  Recent Labs Lab 05/16/15 0247 05/18/15 0350  AST 15  --   ALT 24  --   ALKPHOS 82  --   BILITOT 0.5  --   PROT 6.3*  --   ALBUMIN 2.7* 2.5*     INFECTIOUS No results for input(s): LATICACIDVEN, PROCALCITON in the last 168 hours.   ENDOCRINE CBG (last 3)   Recent Labs  05/17/15 2357 05/18/15 0500 05/18/15 0722   GLUCAP 243* 143* 96         IMAGING x48h  - No results found.   IMPRESSION:  - family hx of ILD/BOOP (mom)   - IPF with 3L o2 need pre admit - may 2016 dx - Rx esbriet since June 2016   -acute on chronic hypoxemic resp failure improved   -  OSA, recent sleep study, has 55yo cpap at home needs new cpap   - Acute on chronic hypoxic respiratory failure Acute pneumonitis/alveolar filling process - diffuse  Suspect acute exacerbation of IPF/AIP   Intake/Output Summary (Last 24 hours) at 05/18/15 0834 Last data filed at 05/18/15 0717  Gross per 24 hour  Intake    150 ml  Output   1600 ml  Net  -1450 ml     PLAN: Continue nocturnal CPAP Goal for negative balance Cont to Wean off high flow O2 - goal pulse ox > 88% as tolerated  Ambulate as able with  pulse ox, up to chair Intermittent CXR Steroids to continue - slow wean  - prednisone 40mg  since 05/15/15 - slow taper over 30 days - hopefully off Needs new cpap device D/w Dr Lorel Monaco [facility] - transplant - encourage weight loss 50# and  Physical conditioning for possible trasnplant eval  Derm appt as opd for bx of right elbow nodule  ENDOCRINE CBG (last 3)   Recent Labs  05/17/15 2357 05/18/15 0500 05/18/15 0722  GLUCAP 243* 143* 96     A: DM - stable with morbid obesity P Cont Lantus 45 units QHS + SSI(decreased 7/7) Monitor electrolytes, replace as indicated   GOALS She states given possibility  of working towards [place] - she wants to change code status to short term vent but still no cpr, no trach/peg.      Mariel Sleet Beeper  [phone]  Cell  [phone]  If no response or cell goes to voicemail, call beeper [phone]  05/18/2015, 8:34 AM

## 2015-05-19 LAB — GLUCOSE, CAPILLARY
GLUCOSE-CAPILLARY: 79 mg/dL (ref 65–99)
GLUCOSE-CAPILLARY: 146 mg/dL — AB (ref 65–99)
GLUCOSE-CAPILLARY: 362 mg/dL — AB (ref 65–99)
Glucose-Capillary: 190 mg/dL — ABNORMAL HIGH (ref 65–99)
Glucose-Capillary: 135 mg/dL — ABNORMAL HIGH (ref 65–99)
Glucose-Capillary: 415 mg/dL — ABNORMAL HIGH (ref 65–99)

## 2015-05-19 NOTE — Progress Notes (Signed)
Changed to [place] at 6L  sats 100%

## 2015-05-19 NOTE — Progress Notes (Signed)
S:   O2 40% ,no new complaints. Stable at present    O:  Filed Vitals:   05/19/15 0049  BP: 97/56  Pulse: 77  Temp: 98 F (36.7 C)  Resp: 18   Today's Vitals   05/18/15 2319 05/19/15 0049 05/19/15 0153 05/19/15 0335  BP:  97/56    Pulse: 81 77    Temp:  98 F (36.7 C)    TempSrc:  Oral    Resp: 18 18    Height:      Weight:      SpO2: 100% 100% 100%   PainSc:    0-No pain      EXAM: General: in bed, speaking full paragraphs, nad Neuro: awake, alert, appropriate, MAE HEENT: obese, jvd down PULM: decreased bs, on high flow O2 CV: s1 s2 RRR  No  GI: soft, obese, NT/ND Extremities: edema neg   PULMONARY No results for input(s): PHART, PCO2ART, PO2ART, HCO3, TCO2, O2SAT in the last 168 hours.  Invalid input(s): PCO2, PO2  CBC  Recent Labs Lab 05/15/15 0435 05/18/15 0500  HGB 11.2* 10.1*  HCT 34.6* 32.0*  WBC 14.5* 13.4*  PLT 265 262    COAGULATION No results for input(s): INR in the last 168 hours.  CARDIAC  No results for input(s): TROPONINI in the last 168 hours. No results for input(s): PROBNP in the last 168 hours.   CHEMISTRY  Recent Labs Lab 05/13/15 0218 05/14/15 0327 05/15/15 0435 05/17/15 0344 05/18/15 0350  NA 133* 135 136 136 137  K 4.2 4.2 4.2 3.8 3.6  CL 95* 95* 97* 98* 102  CO2 27 29 30 29 26   GLUCOSE 315* 260* 251* 209* 170*  BUN 26* 34* 27* 20 18  CREATININE 0.94 0.98 0.96 0.91 0.88  CALCIUM 8.9 9.1 9.1 8.7* 8.4*  MG 1.7 1.9 1.8  --   --   PHOS 3.8 4.7* 4.2  --  3.8   Estimated Creatinine Clearance: 66.8 mL/min (by C-G formula based on Cr of 0.88).   LIVER  Recent Labs Lab 05/16/15 0247 05/18/15 0350  AST 15  --   ALT 24  --   ALKPHOS 82  --   BILITOT 0.5  --   PROT 6.3*  --   ALBUMIN 2.7* 2.5*     INFECTIOUS No results for input(s): LATICACIDVEN, PROCALCITON in the last 168 hours.   ENDOCRINE CBG (last 3)   Recent Labs  05/18/15 1646 05/18/15 2108 05/19/15 0251  GLUCAP 402* 302* 190*          IMAGING x48h  - Dg Chest Port 1 View  05/18/2015   CLINICAL DATA:  Worsening shortness of breath.  Pulmonary fibrosis.  EXAM: PORTABLE CHEST - 1 VIEW  COMPARISON:  05/15/2015  FINDINGS: Chronic appearing bilateral interstitial lung disease is stable in appearance. No evidence of acute or superimposed pulmonary consolidation or pleural effusion. Heart size is stable and within normal limits.  IMPRESSION: Stable appearance of chronic interstitial lung disease. No acute findings.   Electronically Signed   By: Earle Gell M.D.   On: 05/18/2015 11:59     IMPRESSION:  - family hx of ILD/BOOP (mom)   - IPF with 3L o2 need pre admit - may 2016 dx - Rx esbriet since June 2016   -acute on chronic hypoxemic resp failure improved   -  OSA, recent sleep study, has 55yo cpap at home needs new cpap   - Acute on chronic hypoxic respiratory failure Acute  pneumonitis/alveolar filling process - diffuse  Suspect acute exacerbation of IPF/AIP   Intake/Output Summary (Last 24 hours) at 05/19/15 0405 Last data filed at 05/18/15 1651  Gross per 24 hour  Intake      0 ml  Output   1200 ml  Net  -1200 ml     PLAN: Continue nocturnal CPAP Goal for negative balance Cont to Wean off high flow O2 - goal pulse ox > 88% as tolerated  Ambulate as able with pulse ox, up to chair Intermittent CXR Steroids to continue - slow wean  - prednisone 40mg  since 05/15/15 - slow taper over 30 days - hopefully off Needs new cpap device D/w Dr Lorel Monaco [facility] - transplant - encourage weight loss 50# and  Physical conditioning for possible trasnplant eval  Derm appt as opd for bx of right elbow nodule  ENDOCRINE CBG (last 3)   Recent Labs  05/18/15 1646 05/18/15 2108 05/19/15 0251  GLUCAP 402* 302* 190*     A: DM - stable with morbid obesity P Cont Lantus 45 units QHS + SSI(decreased 7/7) Monitor electrolytes, replace as indicated   GOALS She states given possibility  of working towards  [employer] - she wants to change code status to short term vent but still no cpr, no trach/peg.      Joann [name] Beeper  [phone]  Cell  [phone]  If no response or cell goes to voicemail, call beeper [phone]  05/19/2015, 4:05 AM

## 2015-05-20 LAB — GLUCOSE, CAPILLARY
GLUCOSE-CAPILLARY: 141 mg/dL — AB (ref 65–99)
GLUCOSE-CAPILLARY: 425 mg/dL — AB (ref 65–99)
Glucose-Capillary: 284 mg/dL — ABNORMAL HIGH (ref 65–99)
Glucose-Capillary: 424 mg/dL — ABNORMAL HIGH (ref 65–99)
Glucose-Capillary: 92 mg/dL (ref 65–99)

## 2015-05-20 MED ORDER — PREDNISONE 20 MG PO TABS: 40.0000 mg | ORAL_TABLET | Freq: Every day | ORAL | Status: DC

## 2015-05-20 MED ORDER — INSULIN GLARGINE 100 UNIT/ML ~~LOC~~ SOLN: 45.0000 [IU] | Freq: Every day | SUBCUTANEOUS | Status: AC

## 2015-05-20 MED ORDER — ALBUTEROL SULFATE (2.5 MG/3ML) 0.083% IN NEBU: 2.5000 mg | INHALATION_SOLUTION | RESPIRATORY_TRACT | Status: AC | PRN

## 2015-05-20 MED ORDER — INSULIN ASPART 100 UNIT/ML ~~LOC~~ SOLN: 0.0000 [IU] | Freq: Three times a day (TID) | SUBCUTANEOUS | Status: AC

## 2015-05-20 MED ORDER — IPRATROPIUM-ALBUTEROL 0.5-2.5 (3) MG/3ML IN SOLN: 3.0000 mL | Freq: Four times a day (QID) | RESPIRATORY_TRACT | Status: AC

## 2015-05-20 MED ORDER — MORPHINE SULFATE 2 MG/ML IJ SOLN: 2.0000 mg | INTRAMUSCULAR | Status: DC | PRN

## 2015-05-20 NOTE — Progress Notes (Signed)
CSW (Clinical Social Worker) prepared pt dc packet and placed with shadow chart. CSW arranged non-emergent ambulance transport. Pt, pt family, pt nurse, and facility informed. CSW signing off.  Joann [name], LCSWA [phone]  

## 2015-05-20 NOTE — Progress Notes (Addendum)
CSW (Clinical Education officer, museum) initiated new SNF search with updated clinicals for [facility].  ADDENDUM: Pt has accepted bed at [facility]. Prior authorization received. Facility able to accept pt when medically stable for dc.  [place], [place]

## 2015-05-20 NOTE — Progress Notes (Addendum)
Physical Therapy Treatment Patient Details Name: Joann [name] MRN: [id number] DOB: 1960-11-16 Today's Date: 05/20/2015    History of Present Illness Patient is a 54 yo female admitted 05/05/15 with HCAP, respiratory failure, IPF flair.  PMH:  Interstitial pulmonary fibrosis, DM, Obesity, asthma, CA, depression, HTN    PT Comments    Pt admitted with above diagnosis. Pt currently with functional limitations due to balance and endurance deficits. Pt able to ambulate a short distance only before she desaturates.  Left note for MD to clarify activity parameters.  Question if MD wants pt on HFNC vs. Face mask at 50% to ambulate.  Pt stating she wants to go home.  Has a RW and pulse oximeter and states she can check it and call if she is below a certain %.  If MD determines pt can go home and does not need SNF, then [facility] f/u as below and recommend [facility].  Pt agrees.  Continue PT as pt tolerates.   Pt will benefit from skilled PT to increase their independence and safety with mobility to allow discharge to the venue listed below.    Follow Up Recommendations  SNF;Supervision/Assistance - 24 hour (Pt wants HH with HHPT,HHOT, [facility])     Equipment Recommendations  Other (comment) ([facility])    Recommendations for Other Services       Precautions / Restrictions Precautions Precautions: Fall Precaution Comments: On 6LO2 on arrival Restrictions Weight Bearing Restrictions: No    Mobility  Bed Mobility Overal bed mobility: Independent                Transfers Overall transfer level: Needs assistance Equipment used: None Transfers: Sit to/from Stand Sit to Stand: Supervision         General transfer comment: Pt desat on 6LNC to 88% just getting to EOB but recovered to 93% fairly quickly.    Ambulation/Gait Ambulation/Gait assistance: Min guard Ambulation Distance (Feet): 12 Feet Assistive device: None Gait Pattern/deviations: Step-through pattern;Decreased stride length    Gait velocity interpretation: Below normal speed for age/gender General Gait Details: Pt ambulated around bed with 6LO2 with sat to 80%.  Once pt sat down, O2 93% on 6LO2 within 1 1/2 min.     Stairs            Wheelchair Mobility    Modified Rankin (Stroke Patients Only)       Balance Overall balance assessment: Needs assistance Sitting-balance support: No upper extremity supported;Feet supported Sitting balance-Leahy Scale: Fair     Standing balance support: No upper extremity supported;During functional activity Standing balance-Leahy Scale: Fair Standing balance comment: can stand statically without UE support.                     Cognition Arousal/Alertness: Awake/alert Behavior During Therapy: WFL for tasks assessed/performed Overall Cognitive Status: Within Functional Limits for tasks assessed                      Exercises      General Comments General comments (skin integrity, edema, etc.): Left note for MD to clarify parameters for activity.        Pertinent Vitals/Pain Pain Assessment: No/denies pain  O2 sat on arrrival on 6LO2 93-97%.  Pt desat to 88% just getting to EOB.  Pt recovered to 93% and then ambulated around bed dropping to 80%.  Recovered to 93% in 1 1/2 minutes.      Home Living  Prior Function            PT Goals (current goals can now be found in the care plan section) Progress towards PT goals: Progressing toward goals    Frequency  Min 3X/week    PT Plan Current plan remains appropriate    Co-evaluation             End of Session Equipment Utilized During Treatment: Gait belt;Oxygen Activity Tolerance: Patient limited by fatigue Patient left: in chair;with call bell/phone within reach     Time: 6067-7034 PT Time Calculation (min) (ACUTE ONLY): 18 min  Charges:  $Gait Training: 8-22 mins                    G Codes:      Tennie Grussing, [place] Pick 05-27-15, 2:02 PM  Temeka Pore,PT Acute Rehabilitation [phone] (pager)

## 2015-05-20 NOTE — Discharge Summary (Signed)
Physician Discharge Summary       Patient ID: Joann [name] MRN: [id number] DOB/AGE: Mar 24, 1961 54 y.o.  Admit date: 05/05/2015 Discharge date: 05/20/2015  Discharge Diagnoses:  Active Problems:   HTN (hypertension)   ILD (interstitial lung disease)   Shortness of breath   HCAP (healthcare-associated pneumonia)   Healthcare-associated pneumonia   Acute on chronic respiratory failure   History of Present Illness: 54 year old wf f/b Dr Joann [name] w/ known h/o asthma, OSA, GERD, fibromyalgia and most recently diagnosed by bx (may 2016) w/ UIP w/ lymphoid hyperplasia. Since has been hospitalized twice w/ working dx of flare +/- infection and volume overload. Most recently seen by Dr Joann [name] in f/u on 6/24. At that point she still had significant dyspnea but this had improved in comparison to the time of her most recent d/c from the hospital on 6/16. At this point he had advised her to decrease her lasix d/t c/o light-headedness and decrease her prednisone to 5mg  as she had recently titrated up her Esbriet. She presented to the ED on 6/27 w/ increased shortness of breath above baseline, low grade fever and chills. She was admitted w/ working dx of HCAP vs ILD flare. ABX were started and pred increased from 5mg /d to 40. PCCM asked to see on 6/27 when O2 sats continued to worsen and she was requiring 100% non-rebreather O2 support and still would desaturate to 70s w/ any exertion. On PCCM arrival the pt was sitting up-right. She was on 100% NRB exhibiting + accessory muscle use and only able to speak in 1-2 word phrases. She was transferred to the intensive care for further supportive treatment for suspected UIP/ILD flare.  Hospital Course:  6/28 her work of breathing improved and she was cycling on BiPAP and high flow O2. She was made DNR at that time. She required this cycle of BiPAP and HFNC for several days. [facility] was contacted for potential lung transplant, however she will need to have a BMI  reduction and to be completely weaned off of steroids before this can happen. Dr. Chase [name] has this line of communication open with [facility]. 6/30 she was able to be taken off of antibiotics, and seemed to have the greatest clinical improvement with the addition of high dose steroids. She was able to tolerate HFNC during the day using BiPAP only at night in place of her normal CPAP. 7/4 she was able to wean her O2 a bit and come off BiPAP altogether. Resumed nocturnal CPAP as at home. Did have some complication of hyperglycemia due to steroids and insulin was adjusted accordingly. She continued to improve slowly and changed code status on 7/7 to allow for short term intubation if necessary (no trach/peg), still withholding CPR ACLS in setting of arrest. 7/10 she was able to wean off of HFNC to 6L [place]. She was deemed a candidate for discharge at that time.    Discharge Plan by active problems   Acute on chronic hypoxic respiratory failure  Suspect acute exacerbation of IPF/AIP IPF with 3L o2 need pre admit - may 2016 dx - Rx esbriet since June 2016 Acute pneumonitis/alveolar filling process - diffuse OSA, recent sleep study, on CPAP Volume overload, now 16L negative (improved)  PLAN: Continue nocturnal CPAP, Needs new cpap device for home Goal for negative balance Wean O2 as able Ambulate as able with pulse ox, up to chair Intermittent CXR Would treat this as a recurrent flare of her ILD, Pred 40mg  x 30 days and then  discuss with dr Joann [name] plans for decreasing and then stopping Case has been discussed w Dr Joann [name] [facility] - transplant - encourage weight loss 50# and Physical conditioning for possible trasnplant eval Will need dermatology appt as opd for bx of right elbow nodule Check LFT in 1 week due to being on esbriet  DM - stable with morbid obesity -Cont Lantus 45 units QHS + SSI (decreased 7/7) -Monitor electrolytes, replace as indicated    Significant Hospital tests/ studies    Consults  Pulmonary Physical medicine Discharge Exam: BP 92/57 mmHg  Pulse 98  Temp(Src) 98.4 F (36.9 C) (Oral)  Resp 20  Ht 4\' 11"  (1.499 m)  Wt 80.423 kg (177 lb 4.8 oz)  BMI 35.79 kg/m2  SpO2 96%  Neuro: Awake, alert, no focal def  HEENT: [place], no JVD facial adiposity  Cardiovascular: Rrr, tachy ST  Lungs: velcro sounding posterior rales, ++ accessory muscle use  Abdomen: Obese + bowel sounds  Musculoskeletal: Intact  Skin: Intact   Labs at discharge Lab Results  Component Value Date   CREATININE 0.88 05/18/2015   BUN 18 05/18/2015   NA 137 05/18/2015   K 3.6 05/18/2015   CL 102 05/18/2015   CO2 26 05/18/2015   Lab Results  Component Value Date   WBC 13.4* 05/18/2015   HGB 10.1* 05/18/2015   HCT 32.0* 05/18/2015   MCV 86.3 05/18/2015   PLT 262 05/18/2015   Lab Results  Component Value Date   ALT 24 05/16/2015   AST 15 05/16/2015   ALKPHOS 82 05/16/2015   BILITOT 0.5 05/16/2015   Lab Results  Component Value Date   INR 1.07 03/21/2015   INR 1.0 11/17/2013    Current radiology studies No results found.  Disposition:  01-Home or Self Care      Discharge Instructions    Diet - low sodium heart healthy    Complete by:  As directed      Increase activity slowly    Complete by:  As directed             Medication List    STOP taking these medications        albuterol 108 (90 BASE) MCG/ACT inhaler  Commonly known as:  PROVENTIL HFA;VENTOLIN HFA  Replaced by:  albuterol (2.5 MG/3ML) 0.083% nebulizer solution     furosemide 40 MG tablet  Commonly known as:  LASIX     metFORMIN 500 MG 24 hr tablet  Commonly known as:  GLUCOPHAGE-XR      TAKE these medications        albuterol (2.5 MG/3ML) 0.083% nebulizer solution  Commonly known as:  PROVENTIL  Take 3 mLs (2.5 mg total) by nebulization every 2 (two) hours as needed for wheezing.     ALPRAZolam 0.5 MG tablet  Commonly known as:  XANAX  Take 0.5 mg by mouth daily as  needed for anxiety.     cholecalciferol 1000 UNITS tablet  Commonly known as:  VITAMIN D  Take 1,000 Units by mouth every morning.     dicyclomine 20 MG tablet  Commonly known as:  BENTYL  Take 20 mg by mouth every 6 (six) hours.     DULoxetine 60 MG capsule  Commonly known as:  CYMBALTA  Take 60 mg by mouth every morning.     esomeprazole 40 MG capsule  Commonly known as:  NEXIUM  Take 40 mg by mouth 2 (two) times daily before a meal.     Fluticasone-Salmeterol  100-50 MCG/DOSE Aepb  Commonly known as:  ADVAIR  Inhale 1 puff into the lungs as needed (shortness of breath).     guaiFENesin 600 MG 12 hr tablet  Commonly known as:  MUCINEX  Take 1 tablet (600 mg total) by mouth 2 (two) times daily.     insulin aspart 100 UNIT/ML injection  Commonly known as:  novoLOG  Inject 0-9 Units into the skin 3 (three) times daily with meals.     insulin glargine 100 UNIT/ML injection  Commonly known as:  LANTUS  Inject 0.45 mLs (45 Units total) into the skin at bedtime.     ipratropium-albuterol 0.5-2.5 (3) MG/3ML Soln  Commonly known as:  DUONEB  Take 3 mLs by nebulization every 6 (six) hours.     levothyroxine 200 MCG tablet  Commonly known as:  SYNTHROID, LEVOTHROID  Take 200 mcg by mouth daily before breakfast.     meclizine 25 MG tablet  Commonly known as:  ANTIVERT  Take 25 mg by mouth 3 (three) times daily as needed for dizziness.     morphine 2 MG/ML injection  Inject 1 mL (2 mg total) into the vein every 4 (four) hours as needed (SOB).     ondansetron 4 MG tablet  Commonly known as:  ZOFRAN  Take 4 mg by mouth every 8 (eight) hours as needed for nausea or vomiting.     oxyCODONE 5 MG immediate release tablet  Commonly known as:  Oxy IR/ROXICODONE  Take 1-2 tablets (5-10 mg total) by mouth every 4 (four) hours as needed for severe pain.     Pirfenidone 267 MG Caps  Commonly known as:  ESBRIET  Days 1-7: 1 cap three times daily.  Days 8-14: 2 caps three times  daily, Days 15 onward: 3 caps three times daily.     predniSONE 20 MG tablet  Commonly known as:  DELTASONE  Take 2 tablets (40 mg total) by mouth daily with breakfast.     topiramate 25 MG tablet  Commonly known as:  TOPAMAX  Take 25 mg by mouth 2 (two) times daily.     traZODone 150 MG tablet  Commonly known as:  DESYREL  Take 75 mg by mouth at bedtime.     vitamin E 400 UNIT capsule  Take 400 Units by mouth 2 (two) times daily.      ASK your doctor about these medications        estrogens (conjugated) 0.625 MG tablet  Commonly known as:  PREMARIN  Take 0.625 mg by mouth every morning.       Follow-up Information    Follow up with PARRETT,TAMMY, NP On 05/28/2015.   Specialty:  Nurse Practitioner   Why:  7/19 at 4:30. [facility]   Contact information:   [address] [address] [phone]       Discharged Condition: fair  Greater than 30 minutes of time have been dedicated to discharge assessment, planning and discharge instructions.   Signed: Georgann Housekeeper, AGACNP-BC [facility] Pager [phone] or [phone]  05/20/2015 5:15 PM  Baltazar Apo, MD, PhD 05/22/2015, 4:11 PM [facility] [phone] or if no answer [phone]

## 2015-05-20 NOTE — Consult Note (Signed)
Physical Medicine and Rehabilitation Consult Reason for Consult: Severe deconditioning related to/HCAP/respiratory failure Referring Physician: Critical care   HPI: Joann [name] is a 54 y.o. right handed female with history of asthma prednisone dependent/idiopathic pulmonary fibrosis, diabetes mellitus peripheral neuropathy, fibromyalgia, irritable bowel syndrome. Independent prior to admission living with her husband. Recent discharge 03/28/2015 to 04/02/2015 from the hospital for acute on chronic respiratory failure. Presented 05/05/2015 with low-grade fever, white blood cell count 17,000. Chest x-ray unremarkable. Patient was found to be hypoxic. Placed on broad-spectrum antibodies. Follow-up per critical care medicine. Planning slow taper of prednisone. Patient continues to be oxygen dependent with baseline requirement of 3 L. Not a candidate for lung transplant per pulmonary services. Therapies have been initiated slow to participate due to bouts of oxygen desaturation. M.D. has requested physical medicine rehabilitation consult.   Review of Systems  HENT: Negative for hearing loss.   Eyes: Negative for blurred vision.  Respiratory: Positive for cough and shortness of breath.   Gastrointestinal: Positive for nausea and diarrhea.       GERD  Genitourinary: Negative for urgency and frequency.  Musculoskeletal: Positive for myalgias and joint pain. Negative for falls.  Skin: Negative for rash.  Neurological: Positive for weakness and headaches.  Psychiatric/Behavioral: Positive for depression. Negative for memory loss.       Anxiety   Past Medical History  Diagnosis Date  . Asthma   . Diabetes mellitus   . Headache(784.0)     MIGRAINES  . Fibromyalgia   . GERD (gastroesophageal reflux disease)   . IBS (irritable bowel syndrome)   . Renal mass, right   . IC (interstitial cystitis)   . Frequency of urination   . Anemia   . Swelling of both lower extremities     TAKES  MAXIDE FOR SWELLING (DENIES HBP)  . Hypothyroidism   . Depression   . Cancer     kidney  . Hypertension   . Wears glasses   . Sleep apnea     uses a cpap  . Pulmonary fibrosis   . Pneumonia   . Fatty liver   . Oxygen dependent     2 Liters at rest, 3 liters with activity   Past Surgical History  Procedure Laterality Date  . Cesarean section  1983, 1985     x 2  . Bladder tack    . Cystocopy    . Cystoscopy    . Robotic assited partial nephrectomy Right 12/14/2013    Procedure: ROBOTIC ASSITED PARTIAL NEPHRECTOMY;  Surgeon: Dutch Gray, MD;  Location: [facility];  Service: Urology;  Laterality: Right;  . Cystoscopy w/ ureteral stent placement Right 12/14/2013    Procedure: CYSTOSCOPY WITH RETROGRADE PYELOGRAM/URETERAL STENT PLACEMENT;  Surgeon: Dutch Gray, MD;  Location: [facility];  Service: Urology;  Laterality: Right;  . Cervical laminectomy  07/2012    anterior with plates and screws.  . Abdominal hysterectomy  1996  . Cholecystectomy  1995    tubal ligation also  . Posterior cervical laminectomy  2004  . Tubal ligation    . Colonoscopy    . Incisional hernia repair N/A 09/12/2014    Procedure: HERNIA REPAIR INCISIONAL;  Surgeon: Coralie Keens, MD;  Location: [place];  Service: General;  Laterality: N/A;  . Insertion of mesh N/A 09/12/2014    Procedure: INSERTION OF MESH;  Surgeon: Coralie Keens, MD;  Location: [place];  Service: General;  Laterality: N/A;  . Video  assisted thoracoscopy Right 03/21/2015    Procedure: VIDEO ASSISTED THORACOSCOPY;  Surgeon: Gaye Pollack, MD;  Location: [facility];  Service: Thoracic;  Laterality: Right;  . Lung biopsy Right 03/21/2015    Procedure: Right Upper and Lower Lobe Lung Wedge BIOPSY;  Surgeon: Gaye Pollack, MD;  Location: [facility];  Service: Thoracic;  Laterality: Right;   Family History  Problem Relation Age of Onset  . Cancer Mother     breast, lung  . COPD Father   . Hypertension Father   .  Hyperlipidemia Father   . Stroke Father    Social History:  reports that she has never smoked. She has never used smokeless tobacco. She reports that she drinks alcohol. She reports that she does not use illicit drugs. Allergies:  Allergies  Allergen Reactions  . Peanut-Containing Drug Products Shortness Of Breath    Pecans, walnuts as well  . Peanuts [Peanut Oil] Shortness Of Breath  . Sulfa Antibiotics Hives and Shortness Of Breath  . Betadine [Povidone Iodine] Rash  . Latex Rash   Medications Prior to Admission  Medication Sig Dispense Refill  . albuterol (PROVENTIL HFA;VENTOLIN HFA) 108 (90 BASE) MCG/ACT inhaler Inhale 2 puffs into the lungs every 6 (six) hours as needed. For shortness of breath    . cholecalciferol (VITAMIN D) 1000 UNITS tablet Take 1,000 Units by mouth every morning.     . dicyclomine (BENTYL) 20 MG tablet Take 20 mg by mouth every 6 (six) hours.    . DULoxetine (CYMBALTA) 60 MG capsule Take 60 mg by mouth every morning.     Marland Kitchen esomeprazole (NEXIUM) 40 MG capsule Take 40 mg by mouth 2 (two) times daily before a meal.     . estrogens, conjugated, (PREMARIN) 0.625 MG tablet Take 0.625 mg by mouth every morning.     . Fluticasone-Salmeterol (ADVAIR) 100-50 MCG/DOSE AEPB Inhale 1 puff into the lungs as needed (shortness of breath).     Marland Kitchen guaiFENesin (MUCINEX) 600 MG 12 hr tablet Take 1 tablet (600 mg total) by mouth 2 (two) times daily. (Patient taking differently: Take 600 mg by mouth 2 (two) times daily as needed for cough or to loosen phlegm. )    . insulin aspart (NOVOLOG) 100 UNIT/ML injection Inject 0-15 Units into the skin 3 (three) times daily with meals. Sliding scale with meals (Patient taking differently: Inject 15 Units into the skin 3 (three) times daily with meals. Sliding scale with meals)    . insulin glargine (LANTUS) 100 UNIT/ML injection Inject 0.55 mLs (55 Units total) into the skin at bedtime.    Marland Kitchen levothyroxine (SYNTHROID, LEVOTHROID) 200 MCG tablet  Take 200 mcg by mouth daily before breakfast.    . meclizine (ANTIVERT) 25 MG tablet Take 25 mg by mouth 3 (three) times daily as needed for dizziness.    . metFORMIN (GLUCOPHAGE-XR) 500 MG 24 hr tablet Take 1,000 mg by mouth 2 (two) times daily.    . ondansetron (ZOFRAN) 4 MG tablet Take 4 mg by mouth every 8 (eight) hours as needed for nausea or vomiting.    Marland Kitchen oxyCODONE (OXY IR/ROXICODONE) 5 MG immediate release tablet Take 1-2 tablets (5-10 mg total) by mouth every 4 (four) hours as needed for severe pain. 30 tablet 0  . Pirfenidone (ESBRIET) 267 MG CAPS Days 1-7: 1 cap three times daily.  Days 8-14: 2 caps three times daily, Days 15 onward: 3 caps three times daily. 270 capsule 5  . predniSONE (DELTASONE) 10 MG  tablet Take 40 mg daily for 3 days, then 30 mg daily for next 3 days, then 20 mg daily for next 3 days and then 10 daily (Patient taking differently: Take 10 mg by mouth every other day. Take 40 mg daily for 3 days, then 30 mg daily for next 3 days, then 20 mg daily for next 3 days and then 10 daily) 75 tablet 1  . topiramate (TOPAMAX) 25 MG tablet Take 25 mg by mouth 2 (two) times daily.    . traZODone (DESYREL) 150 MG tablet Take 75 mg by mouth at bedtime.    . vitamin E 400 UNIT capsule Take 400 Units by mouth 2 (two) times daily.     Marland Kitchen ALPRAZolam (XANAX) 0.5 MG tablet Take 0.5 mg by mouth daily as needed for anxiety.     . furosemide (LASIX) 40 MG tablet Take 1 tablet (40 mg total) by mouth daily. (Patient not taking: Reported on 05/05/2015) 30 tablet 0    Home: [place] expects to be discharged to:: Private residence Living Arrangements: Spouse/significant other Available Help at Discharge: Family, Friend(s), Available PRN/intermittently (Husband works; son helps prn) Type of Home: House Home Access: Stairs to enter Technical brewer of Steps: 5 Entrance Stairs-Rails: None Home Layout: One level Home Equipment:  (Home O2)  Functional History: Prior  Function Level of Independence: Independent Comments: Able to prepare meals and complete bathing/dressing.  Reports she has to move slowly due to DOE Functional Status:  Mobility: Bed Mobility Overal bed mobility: Independent General bed mobility comments: Patient in chair as PT entered room Transfers Overall transfer level: Needs assistance Equipment used: None Transfers: Sit to/from Stand Sit to Stand: Supervision Stand pivot transfers: Supervision General transfer comment: Pt desat on 6LNC to 88% just getting to EOB but recovered to 93% fairly quickly.   Ambulation/Gait Ambulation/Gait assistance: Min guard Ambulation Distance (Feet): 12 Feet Assistive device: None Gait Pattern/deviations: Step-through pattern, Decreased stride length General Gait Details: Pt ambulated around bed with 6LO2 with sat to 80%.  Once pt sat down, O2 93% on 6LO2 within 1 1/2 min.   Gait velocity interpretation: Below normal speed for age/gender    ADL:    Cognition: Cognition Overall Cognitive Status: Within Functional Limits for tasks assessed Orientation Level: Oriented X4 Cognition Arousal/Alertness: Awake/alert Behavior During Therapy: WFL for tasks assessed/performed Overall Cognitive Status: Within Functional Limits for tasks assessed  Blood pressure 100/64, pulse 81, temperature 98.1 F (36.7 C), temperature source Oral, resp. rate 18, height 4\' 11"  (1.499 m), weight 80.423 kg (177 lb 4.8 oz), SpO2 93 %. Physical Exam  Constitutional: She is oriented to person, place, and time.  54 year old female appearing older than stated age  HENT:  Head: Normocephalic.  Eyes: EOM are normal.  Neck: Normal range of motion. Neck supple. No thyromegaly present.  Cardiovascular: Normal rate and regular rhythm.   Respiratory:  Decreased breath sounds at the bases  GI: Soft. Bowel sounds are normal. She exhibits no distension.  Neurological: She is alert and oriented to person, place, and time.    Skin: Skin is warm and dry.    Results for orders placed or performed during the hospital encounter of 05/05/15 (from the past 24 hour(s))  Glucose, capillary     Status: Abnormal   Collection Time: 05/19/15  4:12 PM  Result Value Ref Range   Glucose-Capillary 362 (H) 65 - 99 mg/dL  Glucose, capillary     Status: Abnormal   Collection Time: 05/19/15  8:27 PM  Result Value Ref Range   Glucose-Capillary 415 (H) 65 - 99 mg/dL  Glucose, capillary     Status: Abnormal   Collection Time: 05/19/15 11:58 PM  Result Value Ref Range   Glucose-Capillary 284 (H) 65 - 99 mg/dL  Glucose, capillary     Status: Abnormal   Collection Time: 05/20/15  4:22 AM  Result Value Ref Range   Glucose-Capillary 141 (H) 65 - 99 mg/dL  Glucose, capillary     Status: None   Collection Time: 05/20/15  7:38 AM  Result Value Ref Range   Glucose-Capillary 92 65 - 99 mg/dL   No results found.  Assessment/Plan: Diagnosis: Acute on chronic espiratory failure related to severe interstitial lung disease  RECOMMENDATIONS: This patient's condition is appropriate for continued rehabilitative care in the following setting: SNF Patient has agreed to participate in recommended program. Yes Note that insurance prior authorization may be required for reimbursement for recommended care.  Comment: Pt requires slower pace approach. Family has accepted SNF bed which is most appropriate for her   Meredith Staggers, MD, [facility] 05/20/2015     05/20/2015

## 2015-05-20 NOTE — Progress Notes (Signed)
PCCM Note   S:   Up to chair and feels better, off high flow O2 to 6L [place]   O:  Filed Vitals:   05/20/15 0528  BP: 100/64  Pulse:   Temp: 98.1 F (36.7 C)  Resp:    Today's Vitals   05/20/15 0119 05/20/15 0528 05/20/15 0831 05/20/15 0906  BP:  100/64    Pulse:      Temp:  98.1 F (36.7 C)    TempSrc:  Oral    Resp:      Height:      Weight:  80.423 kg (177 lb 4.8 oz)    SpO2: 100%  93%   PainSc:    0-No pain      EXAM: General: in bed, speaking full paragraphs, nad Neuro: awake, alert, appropriate, MAE HEENT: obese, jvd down PULM: decreased bs, on high flow O2 CV: s1 s2 RRR  No  GI: soft, obese, NT/ND Extremities: edema neg   PULMONARY No results for input(s): PHART, PCO2ART, PO2ART, HCO3, TCO2, O2SAT in the last 168 hours.  Invalid input(s): PCO2, PO2  CBC  Recent Labs Lab 05/15/15 0435 05/18/15 0500  HGB 11.2* 10.1*  HCT 34.6* 32.0*  WBC 14.5* 13.4*  PLT 265 262    COAGULATION No results for input(s): INR in the last 168 hours.  CARDIAC  No results for input(s): TROPONINI in the last 168 hours. No results for input(s): PROBNP in the last 168 hours.   CHEMISTRY  Recent Labs Lab 05/14/15 0327 05/15/15 0435 05/17/15 0344 05/18/15 0350  NA 135 136 136 137  K 4.2 4.2 3.8 3.6  CL 95* 97* 98* 102  CO2 29 30 29 26   GLUCOSE 260* 251* 209* 170*  BUN 34* 27* 20 18  CREATININE 0.98 0.96 0.91 0.88  CALCIUM 9.1 9.1 8.7* 8.4*  MG 1.9 1.8  --   --   PHOS 4.7* 4.2  --  3.8   Estimated Creatinine Clearance: 66.3 mL/min (by C-G formula based on Cr of 0.88).   LIVER  Recent Labs Lab 05/16/15 0247 05/18/15 0350  AST 15  --   ALT 24  --   ALKPHOS 82  --   BILITOT 0.5  --   PROT 6.3*  --   ALBUMIN 2.7* 2.5*     INFECTIOUS No results for input(s): LATICACIDVEN, PROCALCITON in the last 168 hours.   ENDOCRINE CBG (last 3)   Recent Labs  05/19/15 2358 05/20/15 0422 05/20/15 0738  GLUCAP 284* 141* 92    IMAGING x48h  - No  results found.   IMPRESSION:  - family hx of ILD/BOOP (mom)   - IPF with 3L o2 need pre admit - may 2016 dx - Rx esbriet since June 2016   -acute on chronic hypoxemic resp failure improved   -  OSA, recent sleep study, has 55yo cpap at home needs new cpap   - Acute on chronic hypoxic respiratory failure Acute pneumonitis/alveolar filling process - diffuse  Suspect acute exacerbation of IPF/AIP - contribution volume overload, now 16L negative   Intake/Output Summary (Last 24 hours) at 05/20/15 1343 Last data filed at 05/20/15 0900  Gross per 24 hour  Intake    240 ml  Output    400 ml  Net   -160 ml     PLAN: Continue nocturnal CPAP, Needs new cpap device for home Goal for negative balance Wean O2, now off high flow Ambulate as able with pulse ox, up to chair Intermittent CXR  Would treat this as a recurrent flare of her ILD, Pred 40mg  x 30 days and then discuss with dr Pearson Forster plans for decreasing and then stopping Case has been discussed w Dr Lorel [facility] - transplant - encourage weight loss 50# and  Physical conditioning for possible trasnplant eval  Derm appt as opd for bx of right elbow nodule  ENDOCRINE A: DM - stable with morbid obesity P Cont Lantus 45 units QHS + SSI (decreased 7/7) Monitor electrolytes, replace as indicated   GOALS She states given possibility  of working towards [place] - she wants to change code status to short term vent but still no cpr, no trach/peg.    Baltazar Apo, MD, PhD 05/20/2015, 1:48 PM [facility] [phone] or if no answer [phone]

## 2015-05-20 NOTE — Progress Notes (Signed)
Patient uses a auto-titrating CPAP machine at night, with AUTO MIN: 5 and AUTO MAX: 18. 5 L of O2 bleed in.

## 2015-05-20 NOTE — Clinical Social Work Placement (Signed)
   CLINICAL SOCIAL WORK PLACEMENT  NOTE  Date:  05/20/2015  Patient Details  Name: Joann [name] MRN: [id number] Date of Birth: December 01, 1960  Clinical Social Work is seeking post-discharge placement for this patient at the [place] level of care (*CSW will initial, date and re-position this form in  chart as items are completed):  Yes   Patient/family provided with [place] Work Department's list of facilities offering this level of care within the geographic area requested by the patient (or if unable, by the patient's family).  Yes   Patient/family informed of their freedom to choose among providers that offer the needed level of care, that participate in Medicare, Medicaid or managed care program needed by the patient, have an available bed and are willing to accept the patient.  Yes   Patient/family informed of [place]'s ownership interest in [facility] and [facility], as well as of the fact that they are under no obligation to receive care at these facilities.  PASRR submitted to EDS on 05/16/15     PASRR number received on 05/16/15     Existing PASRR number confirmed on       FL2 transmitted to all facilities in geographic area requested by pt/family on 05/16/15     FL2 transmitted to all facilities within larger geographic area on       Patient informed that his/her managed care company has contracts with or will negotiate with certain facilities, including the following:        Yes   Patient/family informed of bed offers received.  Patient chooses bed at [facility] and [place] recommends and patient chooses bed at      Patient to be transferred to [facility] on  .  Patient to be transferred to facility by PTAR     Patient family notified on   of transfer.  Name of family member notified:        PHYSICIAN Please sign FL2, Please prepare priority discharge summary, including medications,  Please prepare prescriptions     Additional Comment:    _______________________________________________ Berton Mount, [place]

## 2015-05-20 NOTE — Progress Notes (Signed)
Report called to Maura Crandall, RN at [facility]. All questions answered. Pt being transferred out via stretcher by PTAR. Spouse present at the time of transport.

## 2015-05-20 NOTE — Progress Notes (Signed)
Inpatient Diabetes Program Recommendations  AACE/ADA: New Consensus Statement on Inpatient Glycemic Control (2013)  Target Ranges:  Prepandial:   less than 140 mg/dL      Peak postprandial:   less than 180 mg/dL (1-2 hours)      Critically ill patients:  140 - 180 mg/dL   While on solumedrol, fasting glucose tends to run high. However, now that patient is on prednisone, the post-prandial glucose values are elevated.  Fasting glucose values have been less than 100 mg/dL.on 45 units lantus  Please consider a decrease in Lantus 40 units (maybe 35 if fastings continue low) and  add meal coverage of 4 units tidwc. Can also change correction to tidwc and add HS correction scale.  Thank you Rosita Kea, RN, MSN, CDE  Diabetes Inpatient Program Office: [phone] Pager: [phone] 8:00 am to 5:00 pm

## 2015-05-20 NOTE — Clinical Social Work Placement (Signed)
   CLINICAL SOCIAL WORK PLACEMENT  NOTE  Date:  05/20/2015  Patient Details  Name: Joann [name] MRN: [id number] Date of Birth: Feb 15, 1961  Clinical Social Work is seeking post-discharge placement for this patient at the [place] level of care (*CSW will initial, date and re-position this form in  chart as items are completed):  Yes   Patient/family provided with [place] Work Department's list of facilities offering this level of care within the geographic area requested by the patient (or if unable, by the patient's family).  Yes   Patient/family informed of their freedom to choose among providers that offer the needed level of care, that participate in Medicare, Medicaid or managed care program needed by the patient, have an available bed and are willing to accept the patient.  Yes   Patient/family informed of [place]'s ownership interest in [facility] and [facility], as well as of the fact that they are under no obligation to receive care at these facilities.  PASRR submitted to EDS on 05/16/15     PASRR number received on 05/16/15     Existing PASRR number confirmed on       FL2 transmitted to all facilities in geographic area requested by pt/family on 05/16/15     FL2 transmitted to all facilities within larger geographic area on       Patient informed that his/her managed care company has contracts with or will negotiate with certain facilities, including the following:        Yes   Patient/family informed of bed offers received.  Patient chooses bed at [facility] and [place] recommends and patient chooses bed at      Patient to be transferred to [facility] on 05/20/15.  Patient to be transferred to facility by PTAR     Patient family notified on 05/20/15 of transfer.  Name of family member notified:   Joann [name] (spouse))     PHYSICIAN       Additional Comment:     _______________________________________________ Berton Mount, [place]

## 2015-05-22 ENCOUNTER — Telehealth: Payer: Self-pay | Admitting: Internal Medicine

## 2015-05-22 DIAGNOSIS — J84112 Idiopathic pulmonary fibrosis: Secondary | ICD-10-CM

## 2015-05-22 NOTE — Telephone Encounter (Signed)
Spoke with the pt  She states her o2 was increased to 6lpm during recent hospital admit and needs concentrator from [facility] that will go up to 6lpm  I have confirmed that she is supposed to be on 6lpm based on d/c note  Order sent to [facility]  Nothing further needed

## 2015-05-27 LAB — GLUCOSE, CAPILLARY: GLUCOSE-CAPILLARY: 157 mg/dL — AB (ref 65–99)

## 2015-05-28 ENCOUNTER — Ambulatory Visit (INDEPENDENT_AMBULATORY_CARE_PROVIDER_SITE_OTHER): Payer: 59 | Admitting: Adult Health

## 2015-05-28 ENCOUNTER — Other Ambulatory Visit (INDEPENDENT_AMBULATORY_CARE_PROVIDER_SITE_OTHER): Payer: 59

## 2015-05-28 ENCOUNTER — Encounter: Payer: Self-pay | Admitting: Adult Health

## 2015-05-28 VITALS — BP 102/60 | HR 102 | Temp 98.0°F | Ht 59.0 in | Wt 176.0 lb

## 2015-05-28 DIAGNOSIS — J849 Interstitial pulmonary disease, unspecified: Secondary | ICD-10-CM | POA: Diagnosis not present

## 2015-05-28 DIAGNOSIS — J9611 Chronic respiratory failure with hypoxia: Secondary | ICD-10-CM

## 2015-05-28 NOTE — Patient Instructions (Addendum)
Continue on Oxygen 6l/m at rest and 7 l/m with activity .  Order sent to DME for continuous flow only Oxygen.  Continue on Esbriet 3 Three times a day   Labs today  follow up Dr. Chase Caller in 3 weeks and As needed   Please contact office for sooner follow up if symptoms do not improve or worsen or seek emergency care

## 2015-05-29 ENCOUNTER — Telehealth: Payer: Self-pay | Admitting: Adult Health

## 2015-05-29 LAB — HEPATIC FUNCTION PANEL
ALK PHOS: 83 U/L (ref 39–117)
ALT: 22 U/L (ref 0–35)
AST: 15 U/L (ref 0–37)
Albumin: 3.3 g/dL — ABNORMAL LOW (ref 3.5–5.2)
BILIRUBIN DIRECT: 0 mg/dL (ref 0.0–0.3)
Total Bilirubin: 0.3 mg/dL (ref 0.2–1.2)
Total Protein: 6 g/dL (ref 6.0–8.3)

## 2015-05-29 MED ORDER — PIRFENIDONE 267 MG PO CAPS: ORAL_CAPSULE | ORAL | Status: DC

## 2015-05-29 NOTE — Telephone Encounter (Signed)
Spoke with pt, states she needs her Esbriet refilled.  Pt takes this 3 tabs tid.  Pt gets this through [employer].  This has been sent.  Nothing further needed.

## 2015-05-29 NOTE — Progress Notes (Signed)
   Subjective:    Patient ID: Joann [name], female    DOB: 01-04-1961, 54 y.o.   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  2-D echo results showed EF of 60-10%, grade 1 diastolic dysfunction Autoimmune panel neg except RA factor positive   02/2015 Spirometry shows severe restriction with FVC 44%, ratio 93 and FEV 1 48%.   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 A dry cough and tightness  Patient was admitted June 26 through July 11 were acute on chronic hypoxic respiratory failure, exacerbation of IPF, along with acute pneumonitis and volume overload. She was given a prednisone taper. She was discharged on prednisone 40 mg to have at least for 30 days and then reevaluate. He was continued on esbriet. She did require aggressive diuresis. She is feeling slightly better but is very weak. She denies any chest pain, orthopnea, PND, or increased leg swelling       Review of Systems Constitutional:   No  weight loss, night sweats,  Fevers, chills, + fatigue, or  lassitude.  HEENT:   No headaches,  Difficulty swallowing,  Tooth/dental problems, or  Sore throat,                No sneezing, itching, ear ache, nasal congestion, post nasal drip,   CV:  No chest pain,  Orthopnea, PND, swelling in lower extremities, anasarca, dizziness, palpitations, syncope.   GI  No heartburn, indigestion, abdominal pain, nausea, vomiting, diarrhea, change in bowel habits, loss of appetite, bloody stools.   Resp:  .  No chest wall deformity  Skin: no rash or lesions.  GU: no dysuria, change in color of  urine, no urgency or frequency.  No flank pain, no hematuria   MS:  No joint pain or swelling.  No decreased range of motion.  No back pain.  Psych:  No change in mood or affect. No depression or anxiety.  No memory loss.         Objective:   Physical Exam GEN: A/Ox3; pleasant , chronically ill appearing in wheelchair  HEENT:  [place]/AT,  EACs-clear, TMs-wnl, NOSE-clear, THROAT-clear, no lesions, no postnasal drip or exudate noted.   NECK:  Supple w/ fair ROM; no JVD; normal carotid impulses w/o bruits; no thyromegaly or nodules palpated; no lymphadenopathy.  RESP  Bibasilar crackles no accessory muscle use, no dullness to percussion  CARD:  RRR, no m/r/g  , no peripheral edema, pulses intact, no cyanosis or clubbing.  GI:   Soft & nt; nml bowel sounds; no organomegaly or masses detected.  Musco: Warm bil, no deformities or joint swelling noted.   Neuro: alert, no focal deficits noted.    Skin: Warm, no lesions or rashes        Assessment & Plan:

## 2015-05-31 NOTE — Progress Notes (Signed)
Quick Note:  Called and spoke to pt. Informed her of the results per TP. Pt verbalized understanding and denied any further questions or concerns at this time.   ______

## 2015-06-02 ENCOUNTER — Telehealth: Payer: Self-pay | Admitting: Internal Medicine

## 2015-06-02 DIAGNOSIS — J849 Interstitial pulmonary disease, unspecified: Secondary | ICD-10-CM

## 2015-06-02 NOTE — Telephone Encounter (Signed)
Elise  Please do transplant eval referral to [facility] - report in that patient decompensated. [facility] will not accept due to high BMI. Patient knows from earlier in July 2016 that  I will be looking at other transplant centers  Thanks  Dr. Brand Males, M.D., [facility].C.P Pulmonary and Critical Care Medicine Staff Physician [place] [facility] Pager: [phone], If no answer or between  15:00h - 7:00h: call [phone]  06/02/2015 11:54 AM

## 2015-06-03 NOTE — Telephone Encounter (Signed)
Called and spoke to pt. Informed her of the recs per MR. Pt stated she would like to discuss this with her husband as this is a ways to travel. Will call pt back later in the week to check in. OK per MR to doublebook pt on 8.17.16. WCB.

## 2015-06-04 NOTE — Assessment & Plan Note (Signed)
Continue on Oxygen 6l/m at rest and 7 l/m with activity .  Order sent to DME for continuous flow only Oxygen.  Continue on Esbriet 3 Three times a day   Labs today  follow up Dr. Chase Caller in 3 weeks and As needed   Please contact office for sooner follow up if symptoms do not improve or worsen or seek emergency care

## 2015-06-04 NOTE — Assessment & Plan Note (Signed)
Goal to keep oxygen level above 88-90% Plan  Continue on Oxygen 6l/m at rest and 7 l/m with activity .  Order sent to DME for continuous flow only Oxygen.  follow up Dr. Chase Caller in 3 weeks and As needed   Please contact office for sooner follow up if symptoms do not improve or worsen or seek emergency care

## 2015-06-05 ENCOUNTER — Other Ambulatory Visit: Payer: Self-pay | Admitting: *Deleted

## 2015-06-05 NOTE — Patient Outreach (Signed)
Transition of care call: Spoke with pt, HIPPA verified. RN CM was informed by pt today discharged from [facility] 7/12, doing a lot better.  Pt states she stayed one night at the nursing home, they ran out of her oxygen, brother came and picked her up.   Pt states she f/u with her primary care MD by website, updating him on med changes.   Pt reports  she received a phone call from Lung MD's (Dr. Chase Caller) office, transplant (referral) to [facility] in [place], [place].   Pt states she is on 6-7 liters of oxygen, no change in sob (with exertion).   Pt was active in  LTW program/referred to [employer]- discussed with pt providing weekly phone calls as part of transition of care,  home visit if needed to which pt was interested in.    Plan to f/u again with pt on 8/1- home visit.    [name].   [place] [facility]  [phone]

## 2015-06-06 ENCOUNTER — Encounter: Payer: Self-pay | Admitting: *Deleted

## 2015-06-06 NOTE — Telephone Encounter (Signed)
Called and spoke to pt. Pt stated she did want to proceed with the transplant eval. Order placed. Appt made with MR on 8.17.16. Pt verbalized understanding and denied any further questions or concerns at this time.

## 2015-06-10 ENCOUNTER — Other Ambulatory Visit: Payer: Self-pay | Admitting: *Deleted

## 2015-06-10 ENCOUNTER — Encounter: Payer: Self-pay | Admitting: *Deleted

## 2015-06-10 VITALS — BP 98/58 | HR 99 | Resp 20 | Ht 59.0 in | Wt 176.0 lb

## 2015-06-10 DIAGNOSIS — J849 Interstitial pulmonary disease, unspecified: Secondary | ICD-10-CM

## 2015-06-10 NOTE — Patient Outreach (Signed)
[facility]) Care Management   06/10/2015  Joann [name] 1961-08-02 [id number]  Joann [name] is an 54 y.o. female  Subjective: Pt states more sob today, reports walked short distance- saturation dropped to 60%.  Pt states  She increased her O 2 from 6 to 7, has not rechecked O 2 sat.  Pt states Dr. Chase Caller called last week about evaluation at [facility] in [place], [place] for lung  Transplant  and pt agreed to go.   Pt reports on blood sugars- were running low in am so cut back to 50 units of Lantus at night,  still take Novolog 15 units with each meal.      Objective:  O2 sat at rest - 98% on 6.5LNC, recheck 95% with nurse's and pt's pulse ox.  Filed Vitals:   06/10/15 1342  BP: 98/58  Pulse: 99  Resp: 20    ROS  Physical Exam  Constitutional: She is oriented to person, place, and time. She appears well-developed and well-nourished.  Cardiovascular: Normal rate and normal heart sounds.   Respiratory: She has rales.  Coarse- in bilateral lower lobes posteriorly   GI: Soft.  Musculoskeletal: Normal range of motion.  Neurological: She is alert and oriented to person, place, and time. She has normal reflexes.  Skin: Skin is warm and dry.  Psychiatric: She has a normal mood and affect. Her behavior is normal. Judgment and thought content normal.    Current Medications:  Reviewed medications with pt.  Current Outpatient Prescriptions  Medication Sig Dispense Refill  . albuterol (PROVENTIL) (2.5 MG/3ML) 0.083% nebulizer solution Take 3 mLs (2.5 mg total) by nebulization every 2 (two) hours as needed for wheezing. 75 mL 12  . ALPRAZolam (XANAX) 0.5 MG tablet Take 0.5 mg by mouth daily as needed for anxiety.     . cholecalciferol (VITAMIN D) 1000 UNITS tablet Take 1,000 Units by mouth every morning.     . dicyclomine (BENTYL) 20 MG tablet Take 20 mg by mouth every 6 (six) hours.    . DULoxetine (CYMBALTA) 60 MG capsule Take 60 mg by mouth every morning.      Marland Kitchen esomeprazole (NEXIUM) 40 MG capsule Take 40 mg by mouth 2 (two) times daily before a meal.     . estrogens, conjugated, (PREMARIN) 0.625 MG tablet Take 0.625 mg by mouth every morning.     Marland Kitchen guaiFENesin (MUCINEX) 600 MG 12 hr tablet Take 1 tablet (600 mg total) by mouth 2 (two) times daily. (Patient taking differently: Take 600 mg by mouth 2 (two) times daily as needed for cough or to loosen phlegm. )    . insulin aspart (NOVOLOG) 100 UNIT/ML injection Inject 0-9 Units into the skin 3 (three) times daily with meals. 10 mL 11  . insulin glargine (LANTUS) 100 UNIT/ML injection Inject 0.45 mLs (45 Units total) into the skin at bedtime. (Patient taking differently: Inject 55 Units into the skin at bedtime. ) 10 mL 11  . ipratropium-albuterol (DUONEB) 0.5-2.5 (3) MG/3ML SOLN Take 3 mLs by nebulization every 6 (six) hours. 360 mL   . levothyroxine (SYNTHROID, LEVOTHROID) 200 MCG tablet Take 200 mcg by mouth daily before breakfast.    . morphine 10 MG/5ML solution Take by mouth Nightly. Pt states takes a TB syringe dosage every night    . ondansetron (ZOFRAN) 4 MG tablet Take 4 mg by mouth every 8 (eight) hours as needed for nausea or vomiting.    Marland Kitchen oxyCODONE (OXY IR/ROXICODONE) 5 MG  immediate release tablet Take 1-2 tablets (5-10 mg total) by mouth every 4 (four) hours as needed for severe pain. 30 tablet 0  . Pediatric Multiple Vitamins (CHEWABLE MULTIPLE VITAMINS PO) Take by mouth.    . Pirfenidone (ESBRIET) 267 MG CAPS 3 caps TID 270 capsule 5  . predniSONE (DELTASONE) 20 MG tablet Take 2 tablets (40 mg total) by mouth daily with breakfast. 60 tablet 0  . topiramate (TOPAMAX) 25 MG tablet Take 25 mg by mouth 2 (two) times daily.    . traZODone (DESYREL) 150 MG tablet Take 150 mg by mouth at bedtime.     . vitamin E 400 UNIT capsule Take 400 Units by mouth 2 (two) times daily.     . Fluticasone-Salmeterol (ADVAIR) 100-50 MCG/DOSE AEPB Inhale 1 puff into the lungs as needed (shortness of breath).      . meclizine (ANTIVERT) 25 MG tablet Take 25 mg by mouth 3 (three) times daily as needed for dizziness.    Marland Kitchen morphine 2 MG/ML injection Inject 1 mL (2 mg total) into the vein every 4 (four) hours as needed (SOB). (Patient not taking: Reported on 05/28/2015) 1 mL 0   No current facility-administered medications for this visit.    Functional Status:   In your present state of health, do you have any difficulty performing the following activities: 06/10/2015 05/05/2015  Hearing? N N  Vision? N N  Difficulty concentrating or making decisions? N N  Walking or climbing stairs? Y Y  Dressing or bathing? N N  Doing errands, shopping? Y N  Preparing Food and eating ? Y -  Using the Toilet? N -  In the past six months, have you accidently leaked urine? N -  Do you have problems with loss of bowel control? N -  Managing your Medications? N -  Managing your Finances? N -  Housekeeping or managing your Housekeeping? Y -    Fall/Depression Screening:    PHQ 2/9 Scores 06/10/2015  PHQ - 2 Score 2  PHQ- 9 Score 4    Assessment:   Hx of Idiopathic Pulmonary Fibrosis-  Pt currently on O 2 6.5 liters [place] today.  At rest no sob.                           DM- blood sugar today 98, review of pt's glucometer show several  low readings in am, run                                   Higher at night (pt reports comes from Prednisone).   Plan:   Pt to talk to representative from [facility] tomorrow about hooking up humidity bottle on O 2 concentrator             Pt to have snack at night that includes protein- help prevent low blood sugar in am.              Pt to f/u with Dr. Chase Caller 8/17.             RN CM to continue to follow pt for transition of care- next f/u will be 8/8- telephonically.               [facility] CM Care Plan Problem One        Patient Outreach from 06/10/2015 in [place] Problem  One  Recent discharge from SNF (stayed one day),hospitalized for [place] for Problem  One  Active   [facility]N Long Term Goal (31-90 days)  Pt would not readmit into SNF or hospital  within 31days of discharge    [place] Goal Start Date  06/05/15   Interventions for Problem One Long Term Goal  Discussed with pt f/u with MD as ordered, pacing activities, meds reveiwed.    THN CM Short Term Goal #1 (0-30 days)  Pt appetite would improve in the next 30 days    THN CM Short Term Goal #1 Start Date  06/05/15   Interventions for Short Term Goal #1  Discussed with pt importance of nutrition in recovering from an illness.     THN CM Short Term Goal #2 (0-30 days)  Humidity bottle for O2 concentrator to be addressed in the next  8 days    THN CM Short Term Goal #2 Start Date  06/10/15   Interventions for Short Term Goal #2  Discussed with pt need to f/u with [facility] about her humidty bottle as it is currently not connected, mouth drying out.  Pt states [facility] is coming tomorrow.      [name].   [place] [facility]  [phone]

## 2015-06-17 ENCOUNTER — Other Ambulatory Visit: Payer: Self-pay | Admitting: *Deleted

## 2015-06-17 ENCOUNTER — Other Ambulatory Visit: Payer: 59 | Admitting: *Deleted

## 2015-06-17 NOTE — Patient Outreach (Signed)
Telephone call:  Follow up with pt to inquire about humidity bottle on O2 concentrator (address short term goal on care  Plan).    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  Pt states tried a couple of times using Humidity bottle, but when connected was not getting oxygen.  Pt states her nose is drying out.   RN CM suggested to pt to call [facility], let them know what is going on to which she said she would.  Pt states she was able to get the averages from her glucometer: 7 day - 104, 14 day- 109, 30 day- 117.  Pt states today blood sugar in am- in the 60's, ate [place], did not recheck sugar, recently ate- know it came up.      Plan to f/u with pt again telephonically on 8/15 as part of ongoing transition of care.    [name].   [place] [facility]  [phone]

## 2015-06-17 NOTE — Patient Outreach (Addendum)
Transition of care call (week #3): Spoke with pt who states had some swelling in feet over the weekend, coming from being on feet too long.  Pt reports  took Lasix 20mg , swelling gone.  Pt states no change in breathing, sob walking.  Pt states has not heard anything from [facility] (lung transplant), to f/u with Dr. Chase Caller 8/17.  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     Plan to f/u with pt again 8/15 telephonically as part of ongoing transition of care.      [name].   [place] [facility]  [phone]

## 2015-06-19 ENCOUNTER — Ambulatory Visit (HOSPITAL_BASED_OUTPATIENT_CLINIC_OR_DEPARTMENT_OTHER): Payer: 59

## 2015-06-24 ENCOUNTER — Other Ambulatory Visit: Payer: Self-pay | Admitting: *Deleted

## 2015-06-24 NOTE — Patient Outreach (Signed)
Transition of care call (week 4):  Pt states doing good.  Pt states she has not heard anything from [facility] clinic about  Transplant, to see Dr. Chase Caller 8/17.   RN CM inquired of pt if humidity bottle issue was resolved ([facility] provides O2 supplies) to which pt reports they only come out once a month to deliver.  As discussed, pt to talk to MD about this.   Pt states most of the time breathing is  good, able to walk through the house without sob, continues on 6 L [place].   Pt states when she goes out, each portable oxygen tank lasts only 1.5 hours which means she has to ask for more O 2 tanks and each delivery is $75.   RN CM discussed with pt to talk to MD about this at upcoming appointment.    Plan to f/u with pt again telephonically on 9/2, check on status.      [name].   [place] [facility]  [phone]

## 2015-06-26 ENCOUNTER — Encounter: Payer: Self-pay | Admitting: Internal Medicine

## 2015-06-26 ENCOUNTER — Ambulatory Visit (INDEPENDENT_AMBULATORY_CARE_PROVIDER_SITE_OTHER): Payer: 59 | Admitting: Internal Medicine

## 2015-06-26 VITALS — BP 124/68 | HR 99 | Ht 59.0 in | Wt 184.0 lb

## 2015-06-26 DIAGNOSIS — J84112 Idiopathic pulmonary fibrosis: Secondary | ICD-10-CM | POA: Diagnosis not present

## 2015-06-26 DIAGNOSIS — J9611 Chronic respiratory failure with hypoxia: Secondary | ICD-10-CM | POA: Diagnosis not present

## 2015-06-26 NOTE — Patient Instructions (Addendum)
ICD-9-CM ICD-10-CM   1. Chronic respiratory failure with hypoxia 518.83 J96.11    799.02    2. IPF (idiopathic pulmonary fibrosis) 516.31 J84.112     - Glad you are feeling better  - Stop advair  - Cotinue esbriet  - Cotinue prednisone but reduce dose to 30mg  per day - Refer [facility], [place] - lung transplant pgm if not done so already - [facility] to work getting you o2 supplies weekly - USe home pulse oximeter and keeo pulse ox level > 87% at all time - Too bad stay at [facility] was bad experience  - I will take this issue up with soical work dept - Refer pulmonary rehab at [place] shot in fall  Followup  1 months or sooner if needed  - Discuss pulmonary fibrosis foundation support group at follow-up

## 2015-06-26 NOTE — Progress Notes (Signed)
Subjective:    Patient ID: Joann [name], female    DOB: 1961-05-11, 54 y.o.   MRN: [id number]  HPI  Follow-up idiopathic pulmonary fibrosis 03/21/2015 associated with chronic respiratory failure. History of recurrent acute exacerbation idiopathic pulmonary fibrosis twice since diagnosis 03/21/2015  She presents for post HOSPITAL follow-up. Since her biopsy diagnosis of IPF she's had multiple admissions associated with IPF flare up. And once even with acute diastolic heart failure. It is unclear when looking back in these admissions if they were related to a prednisone taper because at times it seemed to be so but at times not. Anyway after the most recent hospitalization in July 2016 she's been left on a higher dose of prednisone 40 mg per day. She was discharged mid July 2016 2 SNIF rehabilitation [facility]. However her stay there was uniquely miserable because of poor infrastructure and she signed herself out [place] and 1 day. Since going home she has rehabilitated herself and the physical conditioning is significantly improved. At this point in time she is on as needed morphine given at the rehabilitation center for dyspnea,  Pirfenidone for IPF and chronic daily prednisone. She has gained weight because of the prednisone. She is on 6 L oxygen but she feels that she is able to do a lot of activities of daily living. In July 2016 we discussed lung transplant but at [facility] want her to lose significant weight. She wants me to inquire at [facility]. In addition she is asking for weekly oxygen supplies. She's yet to attend pulmonary rehabilitation and she is willing to.  No other new issues  Walking desaturation test 185 feet 3 laps on 6 L oxygen with a full head probe: dropped to  84% at 3/4th 1 lap and needed to increase to 8L and did 2 laps at 90% and stopped due to dyspnea. HR 118/min   Current outpatient prescriptions:  .   albuterol (PROVENTIL) (2.5 MG/3ML) 0.083% nebulizer solution, Take 3 mLs (2.5 mg total) by nebulization every 2 (two) hours as needed for wheezing., Disp: 75 mL, Rfl: 12 .  ALPRAZolam (XANAX) 0.5 MG tablet, Take 0.5 mg by mouth daily as needed for anxiety. , Disp: , Rfl:  .  cholecalciferol (VITAMIN D) 1000 UNITS tablet, Take 1,000 Units by mouth every morning. , Disp: , Rfl:  .  dicyclomine (BENTYL) 20 MG tablet, Take 20 mg by mouth every 6 (six) hours., Disp: , Rfl:  .  DULoxetine (CYMBALTA) 60 MG capsule, Take 60 mg by mouth every morning. , Disp: , Rfl:  .  esomeprazole (NEXIUM) 40 MG capsule, Take 40 mg by mouth 2 (two) times daily before a meal. , Disp: , Rfl:  .  estrogens, conjugated, (PREMARIN) 0.625 MG tablet, Take 0.625 mg by mouth every morning. , Disp: , Rfl:  .  Fluticasone-Salmeterol (ADVAIR) 100-50 MCG/DOSE AEPB, Inhale 1 puff into the lungs as needed (shortness of breath). , Disp: , Rfl:  .  guaiFENesin (MUCINEX) 600 MG 12 hr tablet, Take 1 tablet (600 mg total) by mouth 2 (two) times daily. (Patient taking differently: Take 600 mg by mouth 2 (two) times daily as needed for cough or to loosen phlegm. ), Disp: , Rfl:  .  insulin aspart (NOVOLOG) 100 UNIT/ML injection, Inject 0-9 Units into the skin 3 (three) times daily with meals., Disp: 10 mL, Rfl: 11 .  insulin glargine (LANTUS) 100 UNIT/ML injection, Inject 0.45 mLs (45 Units total) into the  skin at bedtime. (Patient taking differently: Inject 55 Units into the skin at bedtime. ), Disp: 10 mL, Rfl: 11 .  ipratropium-albuterol (DUONEB) 0.5-2.5 (3) MG/3ML SOLN, Take 3 mLs by nebulization every 6 (six) hours., Disp: 360 mL, Rfl:  .  levothyroxine (SYNTHROID, LEVOTHROID) 200 MCG tablet, Take 200 mcg by mouth daily before breakfast., Disp: , Rfl:  .  meclizine (ANTIVERT) 25 MG tablet, Take 25 mg by mouth 3 (three) times daily as needed for dizziness., Disp: , Rfl:  .  morphine 10 MG/5ML solution, Take by mouth Nightly. Pt states takes a  TB syringe dosage every night, Disp: , Rfl:  .  ondansetron (ZOFRAN) 4 MG tablet, Take 4 mg by mouth every 8 (eight) hours as needed for nausea or vomiting., Disp: , Rfl:  .  oxyCODONE (OXY IR/ROXICODONE) 5 MG immediate release tablet, Take 1-2 tablets (5-10 mg total) by mouth every 4 (four) hours as needed for severe pain., Disp: 30 tablet, Rfl: 0 .  Pediatric Multiple Vitamins (CHEWABLE MULTIPLE VITAMINS PO), Take by mouth., Disp: , Rfl:  .  Pirfenidone (ESBRIET) 267 MG CAPS, 3 caps TID, Disp: 270 capsule, Rfl: 5 .  predniSONE (DELTASONE) 20 MG tablet, Take 2 tablets (40 mg total) by mouth daily with breakfast., Disp: 60 tablet, Rfl: 0 .  topiramate (TOPAMAX) 25 MG tablet, Take 25 mg by mouth 2 (two) times daily., Disp: , Rfl:  .  traZODone (DESYREL) 150 MG tablet, Take 150 mg by mouth at bedtime. , Disp: , Rfl:  .  vitamin E 400 UNIT capsule, Take 400 Units by mouth 2 (two) times daily. , Disp: , Rfl:    Review of Systems  Constitutional: Negative for fever and unexpected weight change.  HENT: Negative for congestion, dental problem, ear pain, nosebleeds, postnasal drip, rhinorrhea, sinus pressure, sneezing, sore throat and trouble swallowing.   Eyes: Negative for redness and itching.  Respiratory: Positive for cough and shortness of breath. Negative for chest tightness and wheezing.   Cardiovascular: Negative for palpitations and leg swelling.  Gastrointestinal: Negative for nausea and vomiting.  Genitourinary: Negative for dysuria.  Musculoskeletal: Negative for joint swelling.  Skin: Negative for rash.  Neurological: Negative for headaches.  Hematological: Does not bruise/bleed easily.  Psychiatric/Behavioral: Negative for dysphoric mood. The patient is not nervous/anxious.        Objective:   Physical Exam  Constitutional: She is oriented to person, place, and time. She appears well-developed and well-nourished. No distress.  Obese  HENT:  Head: Normocephalic and atraumatic.    Right Ear: External ear normal.  Left Ear: External ear normal.  Mouth/Throat: Oropharynx is clear and moist. No oropharyngeal exudate.  Oxygen on  Eyes: Conjunctivae and EOM are normal. Pupils are equal, round, and reactive to light. Right eye exhibits no discharge. Left eye exhibits no discharge. No scleral icterus.  Neck: Normal range of motion. Neck supple. No JVD present. No tracheal deviation present. No thyromegaly present.  Cardiovascular: Normal rate, regular rhythm, normal heart sounds and intact distal pulses.  Exam reveals no gallop and no friction rub.   No murmur heard. Pulmonary/Chest: Effort normal. No respiratory distress. She has no wheezes. She has rales. She exhibits no tenderness.  Abdominal: Soft. Bowel sounds are normal. She exhibits no distension and no mass. There is no tenderness. There is no rebound and no guarding.  Musculoskeletal: Normal range of motion. She exhibits no edema or tenderness.  Looks more conditioned  Lymphadenopathy:    She has no cervical adenopathy.  Neurological: She is alert and oriented to person, place, and time. She has normal reflexes. No cranial nerve deficit. She exhibits normal muscle tone. Coordination normal.  Skin: Skin is warm and dry. No rash noted. She is not diaphoretic. No erythema. No pallor.  Psychiatric: She has a normal mood and affect. Her behavior is normal. Judgment and thought content normal.  Vitals reviewed.    Filed Vitals:   06/26/15 1427  BP: 124/68  Pulse: 99  Height: 4\' 11"  (1.499 m)  Weight: 184 lb (83.462 kg)  SpO2: 98%  6L   Estimated body mass index is 37.14 kg/(m^2) as calculated from the following:   Height as of this encounter: 4\' 11"  (1.499 m).   Weight as of this encounter: 184 lb (83.462 kg).      Assessment & Plan:     ICD-9-CM ICD-10-CM   1. Chronic respiratory failure with hypoxia 518.83 J96.11    799.02    2. IPF (idiopathic pulmonary fibrosis) 516.31 J84.112     - Glad you are  feeling better but some dyspnea definite due to deconditiong  - Stop advair  - Cotinue esbriet  - Cotinue prednisone but reduce dose to 30mg  per day  - let us see what happens witht his - Refer [place], [place] - lung transplant pgm if not done so already - [facility] to work getting you o2 supplies weekly - USe home pulse oximeter and keeo pulse ox level > 87% at all time - Too bad stay at [facility] was bad experience  - I will take this issue up with soical work dept  (spoke to x-covering Education officer, museum who reported that patients have bad experience in that SNF but was sent there due to insurance status) - Refer pulmonary rehab at [place] shot in fall  Followup  1 months or sooner if needed  > 50% of this > 25 min visit spent in face to face counseling or coordination of care    Dr. Brand Males, M.D., [facility].C.P Pulmonary and Critical Care Medicine Staff Physician [place] [facility] Pager: [phone], If no answer or between  15:00h - 7:00h: call [phone]  06/26/2015 2:49 PM

## 2015-07-02 ENCOUNTER — Ambulatory Visit (INDEPENDENT_AMBULATORY_CARE_PROVIDER_SITE_OTHER): Payer: 59 | Admitting: Internal Medicine

## 2015-07-02 ENCOUNTER — Encounter: Payer: Self-pay | Admitting: Internal Medicine

## 2015-07-02 VITALS — BP 126/78 | HR 99 | Ht 59.0 in | Wt 182.0 lb

## 2015-07-02 DIAGNOSIS — I10 Essential (primary) hypertension: Secondary | ICD-10-CM

## 2015-07-02 DIAGNOSIS — E119 Type 2 diabetes mellitus without complications: Secondary | ICD-10-CM | POA: Diagnosis not present

## 2015-07-02 DIAGNOSIS — R0902 Hypoxemia: Secondary | ICD-10-CM

## 2015-07-02 DIAGNOSIS — R0602 Shortness of breath: Secondary | ICD-10-CM

## 2015-07-02 DIAGNOSIS — J9611 Chronic respiratory failure with hypoxia: Secondary | ICD-10-CM | POA: Diagnosis not present

## 2015-07-02 DIAGNOSIS — G4733 Obstructive sleep apnea (adult) (pediatric): Secondary | ICD-10-CM

## 2015-07-02 DIAGNOSIS — J9621 Acute and chronic respiratory failure with hypoxia: Secondary | ICD-10-CM

## 2015-07-02 NOTE — Progress Notes (Signed)
[facility] Consultation      Assessment and Plan:  -Excessive daytime somnolence, with obstructive sleep apnea;   Her machine is now broken, she needs a new machine and new supplies but it has been more than 5 years since her last contact with [facility].  She was sent for a split night study which was inadequate, I will send for a nocturnal sleep study, if this is inadequate will need a home CPAP titration study.   Pulmonary fibrosis. Continue follow up with Dr. Chase [name].   Congestive heart failure.  -Diabetes mellitus.  -GERD.  -Depression.  -Essential hypertension   Date: 07/02/2015  MRN# [id number] Joann [name] Dec 08, 1960  Referring Physician:   [name] is a 54 y.o. old female seen in consultation for chief complaint of:    Chief Complaint  Patient presents with  . Advice Only    sleep; pt has CPAP, about 55 years old, needs new machine; uses CPAP at night, wakes approx 1 time nightly; tired throughout day.     HPI:  Patient is a 54 year old female with a history of pulmonary fibrosis, currently on Esbriet and prednisone and is following with Dr. Chase [name] in [place]. She is referred today for evaluation for  obstructive sleep apnea. She has been diagnosed with OSA in the past and she had another sleep study in June of this year which was a split night, results reviewed today, study from 04/17/15, she did not meet qualifications for split night during that time She is currently on cpap the machine is at least 55 years old she does not recall the pressure, she has gained about 20 lbs since her original sleep study.  She notes that her current machine does not work properly and sometimes cuts off and on.  She is sleepy during the day, she does not wake refreshed. She wears oxygen at 5 to 7L of oxygen during the day.  She goes to bed at 9 pm, wakes at 8am, still sleepy. She is snoring at night, her husband notes that she is snoring through her mask.  He has also noted that sometimes the machine turns off.    She has been getting new supplies through the internet, she originally got her supplies through [facility], she has not been back to them in more than 5 years.     PMHX:   Past Medical History  Diagnosis Date  . Asthma   . Diabetes mellitus   . Headache(784.0)     MIGRAINES  . Fibromyalgia   . GERD (gastroesophageal reflux disease)   . IBS (irritable bowel syndrome)   . Renal mass, right   . IC (interstitial cystitis)   . Frequency of urination   . Anemia   . Swelling of both lower extremities     TAKES MAXIDE FOR SWELLING (DENIES HBP)  . Hypothyroidism   . Depression   . Cancer     kidney  . Hypertension   . Wears glasses   . Sleep apnea     uses a cpap  . Pulmonary fibrosis   . Pneumonia   . Fatty liver   . Oxygen dependent     2 Liters at rest, 3 liters with activity   Surgical Hx:  Past Surgical History  Procedure Laterality Date  . Cesarean section  1983, 1985     x 2  . Bladder tack    . Cystocopy    . Cystoscopy    . Robotic assited partial  nephrectomy Right 12/14/2013    Procedure: ROBOTIC ASSITED PARTIAL NEPHRECTOMY;  Surgeon: Dutch Gray, MD;  Location: [facility];  Service: Urology;  Laterality: Right;  . Cystoscopy w/ ureteral stent placement Right 12/14/2013    Procedure: CYSTOSCOPY WITH RETROGRADE PYELOGRAM/URETERAL STENT PLACEMENT;  Surgeon: Dutch Gray, MD;  Location: [facility];  Service: Urology;  Laterality: Right;  . Cervical laminectomy  07/2012    anterior with plates and screws.  . Abdominal hysterectomy  1996  . Cholecystectomy  1995    tubal ligation also  . Posterior cervical laminectomy  2004  . Tubal ligation    . Colonoscopy    . Incisional hernia repair N/A 09/12/2014    Procedure: HERNIA REPAIR INCISIONAL;  Surgeon: Coralie Keens, MD;  Location: [facility];  Service: General;  Laterality: N/A;  . Insertion of mesh N/A 09/12/2014    Procedure: INSERTION OF MESH;  Surgeon:  Coralie Keens, MD;  Location: [facility];  Service: General;  Laterality: N/A;  . Video assisted thoracoscopy Right 03/21/2015    Procedure: VIDEO ASSISTED THORACOSCOPY;  Surgeon: Gaye Pollack, MD;  Location: [facility] OR;  Service: Thoracic;  Laterality: Right;  . Lung biopsy Right 03/21/2015    Procedure: Right Upper and Lower Lobe Lung Wedge BIOPSY;  Surgeon: Gaye Pollack, MD;  Location: [facility] OR;  Service: Thoracic;  Laterality: Right;   Family Hx:  Family History  Problem Relation Age of Onset  . Cancer Mother     breast, lung  . COPD Mother   . Hyperlipidemia Mother   . COPD Father   . Hypertension Father   . Hyperlipidemia Father   . Stroke Father    Social Hx:   Social History  Substance Use Topics  . Smoking status: Never Smoker   . Smokeless tobacco: Never Used  . Alcohol Use: Yes     Comment: occasional   Medication:   Current Outpatient Rx  Name  Route  Sig  Dispense  Refill  . albuterol (PROVENTIL) (2.5 MG/3ML) 0.083% nebulizer solution   Nebulization   Take 3 mLs (2.5 mg total) by nebulization every 2 (two) hours as needed for wheezing.   75 mL   12   . ALPRAZolam (XANAX) 0.5 MG tablet   Oral   Take 0.5 mg by mouth daily as needed for anxiety.          . cholecalciferol (VITAMIN D) 1000 UNITS tablet   Oral   Take 1,000 Units by mouth every morning.          . dicyclomine (BENTYL) 20 MG tablet   Oral   Take 20 mg by mouth every 6 (six) hours.         . DULoxetine (CYMBALTA) 60 MG capsule   Oral   Take 60 mg by mouth every morning.          Marland Kitchen esomeprazole (NEXIUM) 40 MG capsule   Oral   Take 40 mg by mouth 2 (two) times daily before a meal.          . estrogens, conjugated, (PREMARIN) 0.625 MG tablet   Oral   Take 0.625 mg by mouth every morning.          . Fluticasone-Salmeterol (ADVAIR) 100-50 MCG/DOSE AEPB   Inhalation   Inhale 1 puff into the lungs as needed (shortness of breath).          Marland Kitchen guaiFENesin (MUCINEX)  600 MG 12 hr tablet   Oral  Take 1 tablet (600 mg total) by mouth 2 (two) times daily. Patient taking differently: Take 600 mg by mouth 2 (two) times daily as needed for cough or to loosen phlegm.          . insulin aspart (NOVOLOG) 100 UNIT/ML injection   Subcutaneous   Inject 0-9 Units into the skin 3 (three) times daily with meals.   10 mL   11   . insulin glargine (LANTUS) 100 UNIT/ML injection   Subcutaneous   Inject 0.45 mLs (45 Units total) into the skin at bedtime. Patient taking differently: Inject 55 Units into the skin at bedtime.    10 mL   11   . ipratropium-albuterol (DUONEB) 0.5-2.5 (3) MG/3ML SOLN   Nebulization   Take 3 mLs by nebulization every 6 (six) hours.   360 mL      . levothyroxine (SYNTHROID, LEVOTHROID) 200 MCG tablet   Oral   Take 200 mcg by mouth daily before breakfast.         . meclizine (ANTIVERT) 25 MG tablet   Oral   Take 25 mg by mouth 3 (three) times daily as needed for dizziness.         Marland Kitchen morphine 10 MG/5ML solution   Oral   Take by mouth Nightly. Pt states takes a TB syringe dosage every night         . ondansetron (ZOFRAN) 4 MG tablet   Oral   Take 4 mg by mouth every 8 (eight) hours as needed for nausea or vomiting.         Marland Kitchen oxyCODONE (OXY IR/ROXICODONE) 5 MG immediate release tablet   Oral   Take 1-2 tablets (5-10 mg total) by mouth every 4 (four) hours as needed for severe pain.   30 tablet   0   . Pediatric Multiple Vitamins (CHEWABLE MULTIPLE VITAMINS PO)   Oral   Take by mouth.         . Pirfenidone (ESBRIET) 267 MG CAPS      3 caps TID   270 capsule   5   . predniSONE (DELTASONE) 20 MG tablet   Oral   Take 2 tablets (40 mg total) by mouth daily with breakfast.   60 tablet   0   . topiramate (TOPAMAX) 25 MG tablet   Oral   Take 25 mg by mouth 2 (two) times daily.         . traZODone (DESYREL) 150 MG tablet   Oral   Take 150 mg by mouth at bedtime.          . vitamin E 400 UNIT  capsule   Oral   Take 400 Units by mouth 2 (two) times daily.              Allergies:  Peanut-containing drug products; Peanuts; Sulfa antibiotics; Betadine; and Latex  Review of Systems: Gen:  Denies  fever, sweats, chills HEENT: Denies blurred vision, double vision. bleeds, sore throat Cvc:  No dizziness, chest pain. Resp:   Denies cough or sputum porduction, shortness of breath Gi: Denies swallowing difficulty, stomach pain. Gu:  Denies bladder incontinence, burning urine Ext:   No Joint pain, stiffness. Skin: No skin rash,  hives Endoc:  No polyuria, polydipsia. Psych: No depression, insomnia. Other:  All other systems were reviewed with the patient and were negative other that what is mentioned in the HPI.   Physical Examination:   VS: There were no vitals taken for this visit.  General Appearance: No distress  Neuro:without focal findings,  speech normal,  HEENT: PERRLA, EOM intact.   Pulmonary: normal breath sounds, bilateral coarse crackles CardiovascularNormal S1,S2.  No m/r/g.   Abdomen: Benign, Soft, non-tender. Renal:  No costovertebral tenderness  GU:  No performed at this time. Endoc: No evident thyromegaly, no signs of acromegaly. Skin:   warm, no rashes, no ecchymosis  Extremities: normal, no cyanosis, clubbing.  Other findings:    LABORATORY PANEL:   CBC No results for input(s): WBC, HGB, HCT, PLT in the last 168 hours. ------------------------------------------------------------------------------------------------------------------  Chemistries  No results for input(s): NA, K, CL, CO2, GLUCOSE, BUN, CREATININE, CALCIUM, MG, AST, ALT, ALKPHOS, BILITOT in the last 168 hours.  Invalid input(s): GFRCGP ------------------------------------------------------------------------------------------------------------------  Cardiac Enzymes No results for input(s): TROPONINI in the last 168  hours. ------------------------------------------------------------  RADIOLOGY:  No results found.     Orders for this visit: No orders of the defined types were placed in this encounter.     Thank  you for the consultation and for allowing [facility] to assist in the care of your patient. Our recommendations are noted above.  Please contact us if we can be of further service.   Marda Stalker, MD.  Board Certified in Internal Medicine, Pulmonary Medicine, [place], and Sleep Medicine.  [facility] Office Number: [phone]  Patricia Pesa, M.D.  Vilinda Boehringer, M.D.  Cheral Marker, M.D

## 2015-07-02 NOTE — Patient Instructions (Addendum)
Refer for sleep study (not split night). Follow up after sleep study.

## 2015-07-03 ENCOUNTER — Telehealth: Payer: Self-pay | Admitting: Internal Medicine

## 2015-07-03 NOTE — Telephone Encounter (Signed)
Patient says that Dr. Chase Caller changed the prednisone from 40mg  to 30mg  daily. Patient is more short of breath and unable to move around the house without getting winded.  She says that she is worse.  She doesn't want to end up in the hospital again.  Patient says she has dry cough.  Some chest tightness, not all the time, just once in a while.   Allergies  Allergen Reactions  . Peanut-Containing Drug Products Shortness Of Breath    Pecans, walnuts as well  . Peanuts [Peanut Oil] Shortness Of Breath  . Pecan Pollen Shortness Of Breath  . Sulfa Antibiotics Hives and Shortness Of Breath  . Betadine [Povidone Iodine] Rash  . Latex Rash

## 2015-07-03 NOTE — Telephone Encounter (Signed)
Patient notified.  Nothing further needed. 

## 2015-07-03 NOTE — Telephone Encounter (Signed)
Ok increase prednisone back to 40mg  per day

## 2015-07-04 ENCOUNTER — Telehealth: Payer: Self-pay | Admitting: Internal Medicine

## 2015-07-04 NOTE — Telephone Encounter (Signed)
Noted and informed sleep lab of authorization #. Nothing else needed. [name]

## 2015-07-09 ENCOUNTER — Ambulatory Visit (HOSPITAL_BASED_OUTPATIENT_CLINIC_OR_DEPARTMENT_OTHER): Payer: 59 | Attending: Internal Medicine

## 2015-07-09 ENCOUNTER — Telehealth: Payer: Self-pay | Admitting: Internal Medicine

## 2015-07-09 DIAGNOSIS — I493 Ventricular premature depolarization: Secondary | ICD-10-CM | POA: Insufficient documentation

## 2015-07-09 DIAGNOSIS — G4730 Sleep apnea, unspecified: Secondary | ICD-10-CM | POA: Diagnosis present

## 2015-07-09 DIAGNOSIS — J8410 Pulmonary fibrosis, unspecified: Secondary | ICD-10-CM | POA: Insufficient documentation

## 2015-07-09 DIAGNOSIS — R0683 Snoring: Secondary | ICD-10-CM | POA: Diagnosis not present

## 2015-07-09 DIAGNOSIS — G4736 Sleep related hypoventilation in conditions classified elsewhere: Secondary | ICD-10-CM | POA: Insufficient documentation

## 2015-07-09 DIAGNOSIS — G4733 Obstructive sleep apnea (adult) (pediatric): Secondary | ICD-10-CM

## 2015-07-09 MED ORDER — PREDNISONE 20 MG PO TABS: 40.0000 mg | ORAL_TABLET | Freq: Every day | ORAL | Status: DC

## 2015-07-09 NOTE — Telephone Encounter (Signed)
Called and spoke with pt Pt stated that MR recently changed her dosage of prednisone due to increased breathing difficulties Pt needed a refill of prednisone 20mg  sent to her pharm so that she could take the correct dose Informed pt that refill would be sent Pharmacy verified  Nothing further is needed

## 2015-07-12 ENCOUNTER — Other Ambulatory Visit: Payer: Self-pay | Admitting: *Deleted

## 2015-07-12 ENCOUNTER — Other Ambulatory Visit: Payer: 59 | Admitting: *Deleted

## 2015-07-12 NOTE — Patient Outreach (Signed)
Transition of care (final call):  Spoke with pt who reports f/u with Dr. Chase Caller 2 weeks ago, MD tried to go down on her Prednisone (40mg  to 30mg ), had to go back up to 40 mg.  Pt reports when they decrease her Prednisone, ends up back in the hospital.   Pt states it is taking awhile to get back, but it is better than when was on 30 mg.  Pt states she had sleep study done at [facility] 8/30 so she can get a new CPAP machine- reports no results yet from study. Pt states she will probably go with [facility] for her CPAP supplies.   Pt states she is on 6- 7 liters of oxygen.  Pt states she did not f/u on humidity bottle for her O2 machine, has occasional nose bleeds.   Pt states she has not heard back on transplant yet (Dr. Chase Caller put in for a consult).  Discussed with pt doing a home visit next week to which pt agreed.     Plan to f/u with pt on 9/9 for home visit.     [name].   [facility]  [phone]

## 2015-07-16 DIAGNOSIS — G4736 Sleep related hypoventilation in conditions classified elsewhere: Secondary | ICD-10-CM | POA: Diagnosis not present

## 2015-07-16 NOTE — Progress Notes (Signed)
Patient Name: Joann [name], Joann [name] Date: 07/09/2015 Gender: Female D.O.B: 1961/02/19 Age (years): 54 Referring Provider: Laverle Hobby Height (inches): 22 Interpreting Physician: Chesley Mires MD, ABSM Weight (lbs): 182 RPSGT: Joni Reining BMI: 28 MRN: [id number] Neck Size: 16.00  CLINICAL INFORMATION Sleep Study Type: NPSG  Indication for sleep study: She has history of pulmonary fibrosis and uses 5 to 7 liters oxygen 24/7.  She has prior history of sleep apnea, and had been on CPAP.  Her current device is very old.  She is required by insurance provider to have repeat sleep study prior to getting new CPAP machine.  Epworth Sleepiness Score: 10     SLEEP STUDY TECHNIQUE As per the AASM Manual for the Scoring of Sleep and Associated Events v2.3 (April 2016) with a hypopnea requiring 4% desaturations.  The channels recorded and monitored were frontal, central and occipital EEG, electrooculogram (EOG), submentalis EMG (chin), nasal and oral airflow, thoracic and abdominal wall motion, anterior tibialis EMG, snore microphone, electrocardiogram, and pulse oximetry.  MEDICATIONS Patient's medications include: reviewed in electronic medical record. Medications self-administered by patient during sleep study : No sleep medicine administered.  SLEEP ARCHITECTURE The study was initiated at 11:08:56 PM and ended at 5:13:14 AM.  Sleep onset time was 18.7 minutes and the sleep efficiency was 80.6%. The total sleep time was 293.6 minutes.  Stage REM latency was N/A minutes.  The patient spent 3.07% of the night in stage N1 sleep, 96.93% in stage N2 sleep, 0.00% in stage N3 and 0.00% in REM.  Alpha intrusion was absent.  Supine sleep was 100.00%.  RESPIRATORY PARAMETERS The overall apnea/hypopnea index (AHI) was 0.0 per hour. There were 0 total apneas, including 0 obstructive, 0 central and 0 mixed apneas. There were 0 hypopneas and 16 RERAs.  The AHI during Stage REM sleep was  N/A per hour.  AHI while supine was 0.0 per hour.  The mean oxygen saturation was 97.92%. The minimum SpO2 during sleep was 94.00%.  The study was done with her using 5 liters supplemental oxygen.  Loud snoring was noted during this study.  CARDIAC DATA The 2 lead EKG demonstrated sinus rhythm. The mean heart rate was 80.38 beats per minute. Other EKG findings include: PVCs.  LEG MOVEMENT DATA The total PLMS were 0 with a resulting PLMS index of 0.00. Associated arousal with leg movement index was 0.0 .  IMPRESSIONS No significant obstructive sleep apnea occurred during this study (AHI = 0.0/h). No significant central sleep apnea occurred during this study (CAI = 0.0/h). Severe oxygen desaturation was noted during this study (Min O2 = 94.00%). The patient snored with Loud snoring volume. EKG findings include PVCs. Clinically significant periodic limb movements did not occur during sleep. No significant associated arousals.  DIAGNOSIS Nocturnal Hypoxemia (327.26 [G47.36 ICD-10]) in setting of pulmonary fibrosis.  RECOMMENDATIONS Based on this study she does not qualify for CPAP therapy to treat obstructive sleep apnea. She had significant oxygen desaturation, and required 5 liters oxygen while asleep.  She could be assessed for non-invasive positive pressure ventilation in setting of chronic respiratory failure.   Chesley Mires, MD, [place], American Board of Sleep Medicine 07/16/2015, 8:05 AM  NPI: [id number]

## 2015-07-18 ENCOUNTER — Telehealth: Payer: Self-pay | Admitting: Internal Medicine

## 2015-07-18 MED ORDER — PREDNISONE 20 MG PO TABS: 40.0000 mg | ORAL_TABLET | Freq: Every day | ORAL | Status: DC

## 2015-07-18 NOTE — Telephone Encounter (Signed)
Called spoke with pt. She reports she is going to [facility].

## 2015-07-18 NOTE — Telephone Encounter (Signed)
Is it [place], [place]. Want to know which doc she is seeing? I have a friend Dr Truman Hayward there and I can potentially alert him

## 2015-07-18 NOTE — Telephone Encounter (Signed)
Pt is seeing Sanda Klein at the [facility] in [place]. Will make MR aware

## 2015-07-18 NOTE — Telephone Encounter (Signed)
RX for prednisone was never sent to the pharm. I have sent this in. Also FYI to MR pt is going to the [facility] clinic 08/26/15.

## 2015-07-18 NOTE — Telephone Encounter (Signed)
Need name of doc she is going to see

## 2015-07-18 NOTE — Telephone Encounter (Signed)
Called and spoke to pt. Pt stated she is unsure of the provider's name she is seeing. Pt stated she is going to call and find out then call us back with the info. Will await call.

## 2015-07-18 NOTE — Telephone Encounter (Signed)
New message created on pt.

## 2015-07-19 ENCOUNTER — Other Ambulatory Visit: Payer: 59 | Admitting: *Deleted

## 2015-07-21 NOTE — Patient Outreach (Signed)
[facility]) Care Management   07/21/2015  Joann [name] 10/13/1961 [id number]  Joann [name] is an 54 y.o. female  Subjective:  Pt reports scheduled to go for an evaluation  10/17 at [facility].  Pt states was told will have 2 weeks of tests.  Pt states she called SW at [facility] about staying at [facility] (more affordable). Pt states she is going to [place] the next week (after [facility])- vacation.   Pt states she still needs to loose 30 lbs and to start pulmonary rehab next week.   Pt states she had to have another sleep study last week in order to get a new CPAP machine, no results yet.  Pt states sugar was low this morning-48, had pretzels, did not recheck sugar.    Objective:   Filed Vitals:   07/19/15 1043  BP: 104/58  Pulse: 88  Resp: 24    ROS  Physical Exam  Constitutional: She is oriented to person, place, and time. She appears well-developed and well-nourished.  Cardiovascular: Normal rate and regular rhythm.   Respiratory:  Slight crackles on inspiration in bilateral lower lobes posteriorly.  Currently on 6.5 LNC  GI: Soft.  Musculoskeletal: Normal range of motion.  Neurological: She is alert and oriented to person, place, and time.  Skin: Skin is warm and dry.  Psychiatric: She has a normal mood and affect. Her behavior is normal. Thought content normal.    Current Medications:  Reviewed medications with pt  Current Outpatient Prescriptions  Medication Sig Dispense Refill  . albuterol (PROVENTIL) (2.5 MG/3ML) 0.083% nebulizer solution Take 3 mLs (2.5 mg total) by nebulization every 2 (two) hours as needed for wheezing. 75 mL 12  . ALPRAZolam (XANAX) 0.5 MG tablet Take 0.5 mg by mouth daily as needed for anxiety.     . cholecalciferol (VITAMIN D) 1000 UNITS tablet Take 1,000 Units by mouth every morning.     . dicyclomine (BENTYL) 20 MG tablet Take 20 mg by mouth every 6 (six) hours.    . DULoxetine (CYMBALTA) 60 MG capsule Take  60 mg by mouth every morning.     Marland Kitchen esomeprazole (NEXIUM) 40 MG capsule Take 40 mg by mouth 2 (two) times daily before a meal.     . estrogens, conjugated, (PREMARIN) 0.625 MG tablet Take 0.625 mg by mouth every morning.     Marland Kitchen guaiFENesin (MUCINEX) 600 MG 12 hr tablet Take 1 tablet (600 mg total) by mouth 2 (two) times daily. (Patient taking differently: Take 600 mg by mouth 2 (two) times daily as needed for cough or to loosen phlegm. )    . insulin aspart (NOVOLOG) 100 UNIT/ML injection Inject 0-9 Units into the skin 3 (three) times daily with meals. 10 mL 11  . insulin glargine (LANTUS) 100 UNIT/ML injection Inject 0.45 mLs (45 Units total) into the skin at bedtime. (Patient taking differently: Inject 55 Units into the skin at bedtime. ) 10 mL 11  . ipratropium-albuterol (DUONEB) 0.5-2.5 (3) MG/3ML SOLN Take 3 mLs by nebulization every 6 (six) hours. 360 mL   . levothyroxine (SYNTHROID, LEVOTHROID) 200 MCG tablet Take 200 mcg by mouth daily before breakfast.    . oxyCODONE (OXY IR/ROXICODONE) 5 MG immediate release tablet Take 1-2 tablets (5-10 mg total) by mouth every 4 (four) hours as needed for severe pain. 30 tablet 0  . Pediatric Multiple Vitamins (CHEWABLE MULTIPLE VITAMINS PO) Take by mouth.    . Pirfenidone (ESBRIET) 267  MG CAPS 3 caps TID 270 capsule 5  . predniSONE (DELTASONE) 20 MG tablet Take 2 tablets (40 mg total) by mouth daily with breakfast. 60 tablet 3  . topiramate (TOPAMAX) 25 MG tablet Take 25 mg by mouth 2 (two) times daily.    . traZODone (DESYREL) 150 MG tablet Take 150 mg by mouth at bedtime.     . vitamin E 400 UNIT capsule Take 400 Units by mouth 2 (two) times daily.     . meclizine (ANTIVERT) 25 MG tablet Take 25 mg by mouth 3 (three) times daily as needed for dizziness.    Marland Kitchen morphine 10 MG/5ML solution Take by mouth Nightly. Pt states takes a TB syringe dosage every night    . ondansetron (ZOFRAN) 4 MG tablet Take 4 mg by mouth every 8 (eight) hours as needed for  nausea or vomiting.     No current facility-administered medications for this visit.    Functional Status:   In your present state of health, do you have any difficulty performing the following activities: 06/10/2015 05/05/2015  Hearing? N N  Vision? N N  Difficulty concentrating or making decisions? N N  Walking or climbing stairs? Y Y  Dressing or bathing? N N  Doing errands, shopping? Y N  Preparing Food and eating ? Y -  Using the Toilet? N -  In the past six months, have you accidently leaked urine? N -  Do you have problems with loss of bowel control? N -  Managing your Medications? N -  Managing your Finances? N -  Housekeeping or managing your Housekeeping? Y -    Fall/Depression Screening:    PHQ 2/9 Scores 06/10/2015  PHQ - 2 Score 2  PHQ- 9 Score 4    Assessment:   Interstitial lung disease- pt continues on 6.5 LNC, no sob ambulating.  Pt going to [facility] 10/17- evaluation (Lung transplant).                           Sleep apnea- pt waiting on results of sleep study for new CPAP machine                           Diabetes:  View of pt's recent blood sugars- had several low readings in am (48,66)    Plan:     Pt to start pulmonary rehab next week.               Pt to go to [facility] clinic 10/17 (2 weeks of testing,evaluation for Lung transplant)               Pt to have snack at night with protein to help prevent low blood sugars in am.                Pt to check sugars more often/record/call MD for abnormal readings               RN CM to f/u with pt 10/10 telephonically, check on status.    [facility] CM Care Plan Problem Three        Most Recent Value   Care Plan Problem Three  Need for lung transplant    Role Documenting the Problem Three  Care Management Coordinator  Care Plan for Problem Three  Active   [facility]N Long Term Goal (31-90) days  Pt would know within next 60 days if eligible for Lung transplant at [facility]  Start Date  07/19/15   Interventions for Problem Three Long Term Goal  encouraged pt on loosing weight, told need to loose 30 lbs.    THN CM Short Term Goal #1 (0-30 days)  Pt able to complete pulmonary rehab in the next 30 days    THN CM Short Term Goal #1 Start Date  07/19/15   Interventions for Short Term Goal #1  encouraged pt to continue to pace activities/prepare for pulmonary rehab, per pt to start next week.       [name].   [facility] [place] [facility]  [phone]

## 2015-07-22 ENCOUNTER — Encounter (HOSPITAL_COMMUNITY)
Admission: RE | Admit: 2015-07-22 | Discharge: 2015-07-22 | Disposition: A | Discharge status: Home / Self Care | Payer: 59 | Source: Ambulatory Visit | Attending: Internal Medicine | Admitting: Internal Medicine

## 2015-07-22 ENCOUNTER — Encounter (HOSPITAL_COMMUNITY): Payer: Self-pay

## 2015-07-22 VITALS — BP 128/75 | HR 98

## 2015-07-22 DIAGNOSIS — J8410 Pulmonary fibrosis, unspecified: Secondary | ICD-10-CM | POA: Diagnosis not present

## 2015-07-22 NOTE — Progress Notes (Signed)
Joann [name] 54 y.o. female Pulmonary Rehab Orientation Note Patient arrived today in Cardiac and Pulmonary Rehab for orientation to Pulmonary Rehab. She was transported from [employer] via wheel chair. She does carry portable oxygen. Per pt, she uses oxygen continuously. Color good, skin warm and dry. Patient is oriented to time and place. Patient's medical history and medications reviewed. Heart rate is normal, breath sounds clear to auscultation, no wheezes, rales, or rhonchi, diminished. Grip strength equal, strong. Distal pulses 3+ bilateral posterior tibial pulses present. Patient reports she does take medications as prescribed. Patient states she follows a Diabetic diet. The patient reports no specific efforts to gain or lose weight.. Patient's weight will be monitored closely. Demonstration and practice of PLB using pulse oximeter. Patient able to return demonstration satisfactorily. Safety and hand hygiene in the exercise area reviewed with patient. Patient voices understanding of the information reviewed. Department expectations discussed with patient and achievable goals were set. The patient shows enthusiasm about attending the program and we look forward to working with this nice lady. The patient is scheduled for a 6 min walk test on Thursday, July 25, 2015 @ 3:45 and to begin exercise on Tuesday, July 30, 2015 in the 1:20pm. [phone]

## 2015-07-25 ENCOUNTER — Encounter (HOSPITAL_COMMUNITY)
Admission: RE | Admit: 2015-07-25 | Discharge: 2015-07-25 | Disposition: A | Discharge status: Home / Self Care | Payer: 59 | Source: Ambulatory Visit | Attending: Internal Medicine | Admitting: Internal Medicine

## 2015-07-25 DIAGNOSIS — J8410 Pulmonary fibrosis, unspecified: Secondary | ICD-10-CM | POA: Diagnosis not present

## 2015-07-25 DIAGNOSIS — Z736 Limitation of activities due to disability: Secondary | ICD-10-CM

## 2015-07-25 NOTE — Progress Notes (Signed)
Joann [name] completed a Six-Minute Walk Test on 07/25/15 . Joann [name] walked 689 feet with 0 breaks.  The patient's lowest oxygen saturation was 87 %, highest heart rate was 127 bpm , and highest blood pressure was 114/58. The patient was on 8 liters of oxygen with a nasal cannula. Patient stated that nothing hindered their walk test.

## 2015-07-26 ENCOUNTER — Other Ambulatory Visit (HOSPITAL_COMMUNITY): Payer: Self-pay | Admitting: Urology

## 2015-07-26 ENCOUNTER — Ambulatory Visit (HOSPITAL_COMMUNITY)
Admission: RE | Admit: 2015-07-26 | Discharge: 2015-07-26 | Disposition: A | Discharge status: Home / Self Care | Payer: 59 | Source: Ambulatory Visit | Attending: Urology | Admitting: Urology

## 2015-07-26 DIAGNOSIS — J8410 Pulmonary fibrosis, unspecified: Secondary | ICD-10-CM | POA: Diagnosis not present

## 2015-07-26 DIAGNOSIS — R0602 Shortness of breath: Secondary | ICD-10-CM | POA: Diagnosis not present

## 2015-07-26 DIAGNOSIS — Z85528 Personal history of other malignant neoplasm of kidney: Secondary | ICD-10-CM

## 2015-07-29 ENCOUNTER — Telehealth: Payer: Self-pay | Admitting: Internal Medicine

## 2015-07-29 DIAGNOSIS — J849 Interstitial pulmonary disease, unspecified: Secondary | ICD-10-CM

## 2015-07-29 NOTE — Telephone Encounter (Signed)
Spoke with pt, states she is doing a lot of traveling in the next couple of months and wishes to switch 02 from [facility] to [place] because they can better accomodate this.  Orders placed to initiate this change.  Nothing further needed at this time.

## 2015-07-30 ENCOUNTER — Encounter (HOSPITAL_COMMUNITY)
Admission: RE | Admit: 2015-07-30 | Discharge: 2015-07-30 | Disposition: A | Discharge status: Home / Self Care | Payer: 59 | Source: Ambulatory Visit | Attending: Internal Medicine | Admitting: Internal Medicine

## 2015-07-30 DIAGNOSIS — J8410 Pulmonary fibrosis, unspecified: Secondary | ICD-10-CM | POA: Diagnosis not present

## 2015-07-30 NOTE — Progress Notes (Signed)
Today, Samarie exercised at [employer]. Service time was from 1330 to 1500.  The patient exercised by performing aerobic, strengthening, and stretching exercises. Oxygen saturation, heart rate, blood pressure, rate of perceived exertion, and shortness of breath were all monitored before, during, and after exercise. Arnita presented with no problems at today's exercise session.  The patient did not have an increase in workload intensity during today's exercise session.  Pre-exercise vitals: . Weight kg: 84.2 . Liters of O2: 6 . SpO2: 93 . HR: 110 . BP: 100/54 . CBG: 165  Exercise vitals: . Highest heartrate:  118 . Lowest oxygen saturation: 87 . Highest blood pressure: 124/70 . Liters of 02: 8  Post-exercise vitals: . SpO2: 99 . HR: 100 . BP: 110/58 . Liters of O2: 6 . CBG: 162 Dr. Brand Males, Medical Director . Dr. Carles Collet is immediately available during today's Pulmonary Rehab session for BOB EASTWOOD on 07/30/2015 .  at 1330 class time. Marland Kitchen '

## 2015-08-01 ENCOUNTER — Encounter: Payer: Self-pay | Admitting: Internal Medicine

## 2015-08-01 ENCOUNTER — Ambulatory Visit (INDEPENDENT_AMBULATORY_CARE_PROVIDER_SITE_OTHER): Payer: 59 | Admitting: Internal Medicine

## 2015-08-01 ENCOUNTER — Telehealth: Payer: Self-pay | Admitting: Internal Medicine

## 2015-08-01 ENCOUNTER — Encounter (HOSPITAL_COMMUNITY)
Admission: RE | Admit: 2015-08-01 | Discharge: 2015-08-01 | Disposition: A | Discharge status: Home / Self Care | Payer: 59 | Source: Ambulatory Visit | Attending: Internal Medicine | Admitting: Internal Medicine

## 2015-08-01 VITALS — BP 130/80 | HR 103 | Ht 59.0 in | Wt 185.2 lb

## 2015-08-01 DIAGNOSIS — J9611 Chronic respiratory failure with hypoxia: Secondary | ICD-10-CM | POA: Diagnosis not present

## 2015-08-01 DIAGNOSIS — J8410 Pulmonary fibrosis, unspecified: Secondary | ICD-10-CM | POA: Diagnosis not present

## 2015-08-01 DIAGNOSIS — J84112 Idiopathic pulmonary fibrosis: Secondary | ICD-10-CM

## 2015-08-01 DIAGNOSIS — R0902 Hypoxemia: Secondary | ICD-10-CM

## 2015-08-01 NOTE — Progress Notes (Signed)
Subjective:    Patient ID: Joann [name], female    DOB: 07/19/1961, 54 y.o.   MRN: [id number]  HPI  Ov 08/01/2015  Chief Complaint  Patient presents with  . Follow-up    Pt states her breathing is unchanged. Pt states she goes to [facility] 08/26/2015. Pt worried her CPAP is not at the correct setting.     IPF with chronci resp failure fu  SEveral issues - still not lost weight. Going to [place], [place] to see if they can help. Going through rehab -d esats to 70s there. They are advising oxy-pendant. Uptodate with flu shot. Feels cpap not working well. Awaiting results   Immunization History  Administered Date(s) Administered  . Influenza Split 08/09/2014, 07/30/2015  . Pneumococcal Polysaccharide-23 12/15/2013    Allergies  Allergen Reactions  . Peanut-Containing Drug Products Shortness Of Breath    Pecans, walnuts as well  . Peanuts [Peanut Oil] Shortness Of Breath  . Pecan Pollen Shortness Of Breath  . Sulfa Antibiotics Hives and Shortness Of Breath  . Betadine [Povidone Iodine] Rash  . Latex Rash       Current outpatient prescriptions:  .  albuterol (PROVENTIL) (2.5 MG/3ML) 0.083% nebulizer solution, Take 3 mLs (2.5 mg total) by nebulization every 2 (two) hours as needed for wheezing., Disp: 75 mL, Rfl: 12 .  ALPRAZolam (XANAX) 0.5 MG tablet, Take 0.5 mg by mouth daily as needed for anxiety. , Disp: , Rfl:  .  cholecalciferol (VITAMIN D) 1000 UNITS tablet, Take 1,000 Units by mouth every morning. , Disp: , Rfl:  .  dicyclomine (BENTYL) 20 MG tablet, Take 20 mg by mouth every 6 (six) hours., Disp: , Rfl:  .  DULoxetine (CYMBALTA) 60 MG capsule, Take 60 mg by mouth every morning. , Disp: , Rfl:  .  esomeprazole (NEXIUM) 40 MG capsule, Take 40 mg by mouth 2 (two) times daily before a meal. , Disp: , Rfl:  .  estrogens, conjugated, (PREMARIN) 0.625 MG tablet, Take 0.625 mg by mouth every morning. , Disp: , Rfl:  .  guaiFENesin (MUCINEX) 600 MG 12 hr tablet, Take 1 tablet  (600 mg total) by mouth 2 (two) times daily. (Patient taking differently: Take 600 mg by mouth 2 (two) times daily as needed for cough or to loosen phlegm. ), Disp: , Rfl:  .  insulin aspart (NOVOLOG) 100 UNIT/ML injection, Inject 0-9 Units into the skin 3 (three) times daily with meals., Disp: 10 mL, Rfl: 11 .  insulin glargine (LANTUS) 100 UNIT/ML injection, Inject 0.45 mLs (45 Units total) into the skin at bedtime. (Patient taking differently: Inject 55 Units into the skin at bedtime. ), Disp: 10 mL, Rfl: 11 .  ipratropium-albuterol (DUONEB) 0.5-2.5 (3) MG/3ML SOLN, Take 3 mLs by nebulization every 6 (six) hours., Disp: 360 mL, Rfl:  .  levothyroxine (SYNTHROID, LEVOTHROID) 200 MCG tablet, Take 200 mcg by mouth daily before breakfast., Disp: , Rfl:  .  LISINOPRIL PO, Take by mouth daily., Disp: , Rfl:  .  meclizine (ANTIVERT) 25 MG tablet, Take 25 mg by mouth 3 (three) times daily as needed for dizziness., Disp: , Rfl:  .  metFORMIN (GLUCOPHAGE) 500 MG tablet, Take 500 mg by mouth 2 (two) times daily with a meal., Disp: , Rfl:  .  Multiple Vitamins-Minerals (MULTIVITAMIN GUMMIES WOMENS PO), Take by mouth daily., Disp: , Rfl:  .  ondansetron (ZOFRAN) 4 MG tablet, Take 4 mg by mouth every 8 (eight) hours as needed for nausea  or vomiting., Disp: , Rfl:  .  oxyCODONE (OXY IR/ROXICODONE) 5 MG immediate release tablet, Take 1-2 tablets (5-10 mg total) by mouth every 4 (four) hours as needed for severe pain., Disp: 30 tablet, Rfl: 0 .  Pirfenidone (ESBRIET) 267 MG CAPS, 3 caps TID, Disp: 270 capsule, Rfl: 5 .  potassium chloride SA (K-DUR,KLOR-CON) 20 MEQ tablet, Take 20 mEq by mouth daily., Disp: , Rfl:  .  predniSONE (DELTASONE) 20 MG tablet, Take 2 tablets (40 mg total) by mouth daily with breakfast., Disp: 60 tablet, Rfl: 3 .  topiramate (TOPAMAX) 25 MG tablet, Take 25 mg by mouth 2 (two) times daily., Disp: , Rfl:  .  traZODone (DESYREL) 150 MG tablet, Take 150 mg by mouth at bedtime. , Disp: , Rfl:    .  vitamin E 400 UNIT capsule, Take 400 Units by mouth 2 (two) times daily. , Disp: , Rfl:       Review of Systems  Constitutional: Negative for fever and unexpected weight change.  HENT: Negative for congestion, dental problem, ear pain, nosebleeds, postnasal drip, rhinorrhea, sinus pressure, sneezing, sore throat and trouble swallowing.   Eyes: Negative for redness and itching.  Respiratory: Positive for cough and shortness of breath. Negative for chest tightness and wheezing.   Cardiovascular: Negative for palpitations and leg swelling.  Gastrointestinal: Negative for nausea and vomiting.  Genitourinary: Negative for dysuria.  Musculoskeletal: Negative for joint swelling.  Skin: Negative for rash.  Neurological: Negative for headaches.  Hematological: Does not bruise/bleed easily.  Psychiatric/Behavioral: Negative for dysphoric mood. The patient is not nervous/anxious.        Objective:   Physical Exam  Filed Vitals:   08/01/15 1703  BP: 130/80  Pulse: 103  Height: 4\' 11"  (1.499 m)  Weight: 185 lb 3.2 oz (84.006 kg)  SpO2: 98%    Focussed exam Obese o2 on crackles      Assessment & Plan:     ICD-9-CM ICD-10-CM   1. Chronic respiratory failure with hypoxia 518.83 J96.11    799.02    2. IPF (idiopathic pulmonary fibrosis) 516.31 J84.112 DME Other see comment    Stable   - Cotinue esbriet  - Cotinue prednisone at 40mg  per day - Looking forward to  [facility], [place] - lung transplant pgm mid-oct 2016 - we wil do oxy-pendant as advised by rehab - continue rehab - will touch bsed with Dr Ashby Dawes and have him call you with sleep study results - USe home pulse oximeter and keeo pulse ox level > 87% at all time - glad uptodate with  FLu shot and singhles - willl update this in our system - CHeck with MAyo about travel to [place], [place] - elevation 1200 feet, 400 meters'  - I would caution you agains it  Followup  Nov-mid 2016 with Dr  Chase Caller  - Discuss pulmonary fibrosis foundation support group at follow-up   > 50% of this > 25 min visit spent in face to face counseling or coordination of care   Dr. Brand Males, M.D., [facility].C.P Pulmonary and Critical Care Medicine Staff Physician [facility] Pager: [phone], If no answer or between  15:00h - 7:00h: call [phone]  09/06/2015 9:05 PM

## 2015-08-01 NOTE — Telephone Encounter (Signed)
lmtcb X1 for Molly at pulm rehab to make aware.   Order placed. Pt aware of order.

## 2015-08-01 NOTE — Telephone Encounter (Signed)
Yes that is fine. I am seeing her later 08/01/2015/. Maybe ELise can do it. Just let her know

## 2015-08-01 NOTE — Telephone Encounter (Signed)
Called pulm rehab and LMTCB x1

## 2015-08-01 NOTE — Telephone Encounter (Signed)
[phone] Cloyde Reams calling back

## 2015-08-01 NOTE — Progress Notes (Signed)
Today, Joann [name] exercised at [employer]. Service time was from 1330 to 1520.  The patient exercised by performing aerobic, strengthening, and stretching exercises. Oxygen saturation, heart rate, blood pressure, rate of perceived exertion, and shortness of breath were all monitored before, during, and after exercise. Joann [name] presented with no problems at today's exercise session. She attended signs and symptoms class today.  The patient did not have an increase in workload intensity during today's exercise session.  Pre-exercise vitals: . Weight kg: 84.2 . Liters of O2: 6 . SpO2: 99 . HR: 103 . BP: 100/64 . CBG: 368  Exercise vitals: . Highest heartrate:  104 . Lowest oxygen saturation: 84 . Highest blood pressure: 124/66 . Liters of 02: 6  Post-exercise vitals: . SpO2: 99 . HR: 109 . BP: 116/72 . Liters of O2: 6 . CBG: 290 Dr. Brand Males, Medical Director Dr. Ree Kida is immediately available during today's Pulmonary Rehab session for Joann [name] on 08/01/2015  at 1330 class time.  Marland Kitchen

## 2015-08-01 NOTE — Patient Instructions (Addendum)
ICD-9-CM ICD-10-CM   1. Chronic respiratory failure with hypoxia 518.83 J96.11    799.02    2. IPF (idiopathic pulmonary fibrosis) 516.31 J84.112    Stable   - Cotinue esbriet  - Cotinue prednisone at 40mg  per day - Looking forward to  [facility], [place] - lung transplant pgm mid-oct 2016 - we wil do oxy-pendant as advised by rehab - continue rehab - will touch bsed with Dr Ashby Dawes and have him call you with sleep study results - USe home pulse oximeter and keeo pulse ox level > 87% at all time - glad uptodate with  FLu shot and singhles - willl update this in our system - CHeck with MAyo about travel to [place], [place] - elevation 1200 feet, 400 meters'  - I would caution you agains it  Followup  Nov-mid 2016 with Dr Chase Caller  - Discuss pulmonary fibrosis foundation support group at follow-up

## 2015-08-01 NOTE — Telephone Encounter (Signed)
Spoke with Cloyde Reams at [facility] rehab, states that with exertion pt is having to use 8-10lpm 02.  Cloyde Reams is requesting that MR order a pendant oximyzer for pt.    MR are you ok with this order?  Thanks!

## 2015-08-02 NOTE — Telephone Encounter (Signed)
lmtcb X1 for [employer].  Order has already been placed to pt's DME company and I've already spoken to pt.

## 2015-08-02 NOTE — Telephone Encounter (Signed)
Joann [name] will contact pt regarding oximyzer.  Joann [name] needs to know if pt can come by our office to pick that up- [phone]

## 2015-08-02 NOTE — Telephone Encounter (Signed)
Spoke to [place]. Informed her of the information. Nothing further is needed.

## 2015-08-06 ENCOUNTER — Encounter (HOSPITAL_COMMUNITY)
Admission: RE | Admit: 2015-08-06 | Discharge: 2015-08-06 | Disposition: A | Discharge status: Home / Self Care | Payer: 59 | Source: Ambulatory Visit | Attending: Internal Medicine | Admitting: Internal Medicine

## 2015-08-06 DIAGNOSIS — J8410 Pulmonary fibrosis, unspecified: Secondary | ICD-10-CM | POA: Diagnosis not present

## 2015-08-06 NOTE — Progress Notes (Signed)
Today, Joann [name] exercised at [employer]. Service time was from 1330 to 1500.  The patient exercised by performing aerobic, strengthening, and stretching exercises. Oxygen saturation, heart rate, blood pressure, rate of perceived exertion, and shortness of breath were all monitored before, during, and after exercise. [name] presented with no problems at today's exercise session.  The patient did not have an increase in workload intensity during today's exercise session.  Pre-exercise vitals: . Weight kg: 85.0 . Liters of O2: 6L . SpO2: 98 . HR: 97 . BP: 110/60 . CBG: 187  Exercise vitals: . Highest heartrate:  123 . Lowest oxygen saturation: 85 increased to 96 with rest break and plb . Highest blood pressure: 110/64 . Liters of 02: 6L  Post-exercise vitals: . SpO2: 99 . HR: 94 . BP: 110/64 . Liters of O2: 6L . CBG: 197  Dr. Brand Males, Medical Director Dr. Ree Kida is immediately available during today's Pulmonary Rehab session for [name] on 08/06/2015 at 1330 class time.

## 2015-08-08 ENCOUNTER — Encounter (HOSPITAL_COMMUNITY)
Admission: RE | Admit: 2015-08-08 | Discharge: 2015-08-08 | Disposition: A | Discharge status: Home / Self Care | Payer: 59 | Source: Ambulatory Visit | Attending: Internal Medicine | Admitting: Internal Medicine

## 2015-08-08 DIAGNOSIS — J8410 Pulmonary fibrosis, unspecified: Secondary | ICD-10-CM | POA: Diagnosis not present

## 2015-08-08 NOTE — Progress Notes (Signed)
Today, Memorie exercised at [employer]. Service time was from 1330 to 1530.  The patient exercised by performing aerobic, strengthening, and stretching exercises. Oxygen saturation, heart rate, blood pressure, rate of perceived exertion, and shortness of breath were all monitored before, during, and after exercise. Breia presented with low desaturations down to 74-79% on 10 liters of oxygen after walking from one exercise station to anotherat today's exercise session. Patient attended the Anatomy and Physiology class today.  We will try patient on an oximask on Tuesday to see if this helps with desaturation.      The patient did not have an increase in workload intensity during today's exercise session.  Pre-exercise vitals: . Weight kg: 86.2 . Liters of O2: 6 . SpO2: 90 . HR: 101 . BP: 118/64 . CBG: 161  Exercise vitals: . Highest heartrate:  120 . Lowest oxygen saturation: 79 . Highest blood pressure: 140/64 . Liters of 02: 8-10  Post-exercise vitals: . SpO2: 99 . HR: 96 . BP: 130/70 . Liters of O2: 10 . CBG: 241 Dr. Brand Males, Medical Director Dr. Carles Collet is immediately available during today's Pulmonary Rehab session for Joann [name] on 08/08/2015  at 1330 class time.  Marland Kitchen

## 2015-08-13 ENCOUNTER — Encounter (HOSPITAL_COMMUNITY)
Admission: RE | Admit: 2015-08-13 | Discharge: 2015-08-13 | Disposition: A | Discharge status: Home / Self Care | Payer: 59 | Source: Ambulatory Visit | Attending: Internal Medicine | Admitting: Internal Medicine

## 2015-08-13 DIAGNOSIS — J8410 Pulmonary fibrosis, unspecified: Secondary | ICD-10-CM | POA: Insufficient documentation

## 2015-08-13 NOTE — Progress Notes (Signed)
Today, Joann [name] exercised at [employer]. Service time was from 1330 to 1510.  The patient exercised by performing aerobic, strengthening, and stretching exercises. Oxygen saturation, heart rate, blood pressure, rate of perceived exertion, and shortness of breath were all monitored before, during, and after exercise. Joann [name] presented with no problems at today's exercise session.Our nutritionist spent an extensive amount of time with Joann [name] to assist her with weight loss to enable her to be a candidate for a lung transplant.    The patient did not have an increase in workload intensity during today's exercise session.  Pre-exercise vitals: . Weight kg: 84.7 . Liters of O2: 8 . SpO2: 88 . HR: 121 . BP: 110/62 . CBG: 292  And a recheck 30 minutes later it reduced to 232  Exercise vitals: . Highest heartrate:  102 . Lowest oxygen saturation: 98 . Highest blood pressure:  . Liters of 02: 10  Post-exercise vitals: . SpO2: 99 . HR: 104 . BP: 118/70 . Liters of O2: 10 . CBG: 191 Dr. Brand Males, Medical Director Dr. Allyson Sabal is immediately available during today's Pulmonary Rehab session for Joann [name] on 08/13/2015  at 1330 class time  .

## 2015-08-13 NOTE — Progress Notes (Signed)
Joann [name] 54 y.o. female Nutrition Consult for Weight Loss Spoke with pt per Dr. Golden Pop request. Pt is obese. Pt with a long history of trying to lose wt. Pt reports she has done Weight Watchers, Physician's Weight loss, Nutrisystem, Optifast, and Victoza/Byetta all with success "until I went off of them." Pt has gained 3.4 kg since starting rehab. Pt is drinking Plexus for wt loss once daily. Pt states, "I feel like I'm dying." Wt loss as pt's barrier to transplant discussed. Per discussion with Dr. Chase Caller, the gravity of pt's need for wt loss for transplant reiterated. Pt understands that losing wt is her only option if she wants a transplant to live. There are some ways pt can make her eating habits healthier. Pt's Rate Your Plate results reviewed with pt. Pt avoids some salty foods; does not use canned/ convenience food, but pt does eat out frequently. Pt adds "very little" salt to food. Pt uses Morton's Lite salt. Contraindications for using salt substitutes and the role of sodium in lung disease reviewed with pt. Pt is diabetic.  Pt checks CBG's 3-4 times a day.  Fasting CBG's reportedly 60 -90 mg/dL. Pt reports CBG trends increase after she takes prednisone. Pt states her A1c 2 weeks ago was 8.2, which indicates blood glucose not optimally controlled. Pt expressed understanding of the information discussed.  Lab Results  Component Value Date   HGBA1C 8.6* 02/27/2015   Nutrition Diagnosis ? Food-and nutrition-related knowledge deficit related to lack of exposure to information as related to diagnosis of pulmonary disease ? Obesity related to excessive energy intake as evidenced by a BMI of 35.7  Nutrition Rx/Est. Daily Nutrition Needs for: ? wt loss 1200-1500 Kcal  70-80 gm protein   1500 mg or less sodium     150-175 gm CHO Nutrition Intervention ? Pt's individual nutrition plan and goals reviewed with pt. ? Benefits of adopting healthy eating habits discussed when pt's Rate Your  Plate reviewed. ? Handouts given for: Optifast contact information and Making Healthier Choices When Eating Out ? Will contact Dr. Ardeth Perfect re: ? Restart Victoza/Byetta to assist pt in wt loss effort ? Pt to attend the Nutrition and Lung Disease class ? Continual client-centered nutrition education by RD, as part of interdisciplinary care. Goal(s) 1. Identify food quantities necessary to achieve wt loss of  -2# per week to a goal wt of 71.9-80.1 kg (158-176 lb) at graduation from pulmonary rehab. 2. CBG's in the normal range or as close to normal as is safely possible. Monitor and Evaluate progress toward nutrition goal with team.   Derek Mound, M.Ed, RD, LDN, CDE 08/13/2015 2:54 PM

## 2015-08-15 ENCOUNTER — Other Ambulatory Visit: Payer: Self-pay

## 2015-08-15 ENCOUNTER — Encounter (HOSPITAL_COMMUNITY)
Admission: RE | Admit: 2015-08-15 | Discharge: 2015-08-15 | Disposition: A | Discharge status: Home / Self Care | Payer: 59 | Source: Ambulatory Visit | Attending: Internal Medicine | Admitting: Internal Medicine

## 2015-08-15 DIAGNOSIS — J8410 Pulmonary fibrosis, unspecified: Secondary | ICD-10-CM | POA: Diagnosis not present

## 2015-08-15 NOTE — Patient Outreach (Signed)
[facility]) Care Management  08/15/2015  Joann [name] 1961/10/01 [id number]   Dr. Ardeth Perfect  I called [name] today just because she's been my patient for so long (in the [place] [facility]ness program at [facility]) and I wanted to check on her. She is currently in the care of my [facility] care teammate Kathie Rhodes, RN who sees her at home.  She cried the entire phone call saying she couldn't stop crying- she was started on Cymbalta by Dr. Nicolasa Ducking- it helped initially but isn't helping now- I told her I'd let you know as Dr. Nicolasa Ducking told [name] she wanted you to manage it.   She said her husband just started a new job and couldn't take much time off to help her. She feeling overwhelmed, worried she will get a cold/flu and "that will be the end of me".  She went to her 1st optifast meeting last night in an attempt to lose weight. She's going to pulmonary rehab and goes to the [facility] clinic in 2 weeks  I am trying to have a [facility] social worker meet with her if appropriate.  Can you assess the need for more depression medication?    Gentry Fitz, RN, BA, [place], [place] Direct Dial:  [phone]  Fax:  [phone] E-mail: [email or web address] [address] [place], [place]  [address]

## 2015-08-15 NOTE — Progress Notes (Signed)
Today, Joann [name] exercised at [employer]. Service time was from 1:30pm to 3:30pm.  The patient exercised by performing aerobic, strengthening, and stretching exercises. Oxygen saturation, heart rate, blood pressure, rate of perceived exertion, and shortness of breath were all monitored before, during, and after exercise. Joann [name] presented with no problems at today's exercise session. The patient attended education today with Derek Mound on Nutrition for the Pulmonary Patient.  The patient did not have an increase in workload intensity during today's exercise session.  Pre-exercise vitals: . Weight kg: 85.2 . Liters of O2: 6 . SpO2: 91 . HR: 113 . BP: 100/54 . CBG: 240  Exercise vitals: . Highest heartrate:  118 . Lowest oxygen saturation: 74 . Highest blood pressure: 122/60 . Liters of 02: 15  Post-exercise vitals: . SpO2: 92 . HR: 109 . BP: 100/60 . Liters of O2: 6 . CBG: 319  Dr. Brand Males, Medical Director Dr. Allyson Sabal is immediately available during today's Pulmonary Rehab session for Joann [name] on 08/15/15 at 1:30pm class time

## 2015-08-19 ENCOUNTER — Ambulatory Visit: Payer: 59 | Admitting: *Deleted

## 2015-08-19 ENCOUNTER — Other Ambulatory Visit: Payer: Self-pay | Admitting: *Deleted

## 2015-08-19 NOTE — Patient Outreach (Signed)
Telephone outreach recommended by Fernande Bras, RN, [facility] program. I was able to speak to Joann [name] and she reports she is doing fair this am. She has not heard from the MD about adjusting her antidepressant medication yet, he was just notified on Friday that this needed to be considered. I asked how I could support her and found that she has been driving herself to appts when her husband cannot which is very taxing on her. I offered to provide her with transportation to her appt this Thursday to Pulmonary Rehab and she was very appreciative of this. I also told her I would check in with her daily this week. She will be going to [facility] in [place] this coming Sunday for 2 weeks.  Deloria Lair [facility] [place] [phone]

## 2015-08-19 NOTE — Patient Outreach (Signed)
[facility]) Care Management  08/19/2015  Joann [name] 1960-12-17 [id number]   Telephone outreach to patient to inform her of transportation arrangements for her appointment to Cardiac Rehab on 08/22/2015. Informed patient that [employer] would be transporting her to and from the appointment.  [name] L. Luretha Eberly, [place] Care Management Assistant

## 2015-08-20 ENCOUNTER — Encounter (HOSPITAL_COMMUNITY): Payer: 59

## 2015-08-20 ENCOUNTER — Telehealth: Payer: Self-pay | Admitting: Internal Medicine

## 2015-08-20 ENCOUNTER — Telehealth: Payer: Self-pay | Admitting: *Deleted

## 2015-08-20 ENCOUNTER — Other Ambulatory Visit: Payer: Self-pay | Admitting: *Deleted

## 2015-08-20 NOTE — Telephone Encounter (Signed)
Another message has been sent to provider. Will close this message and f/u on other. Pt is informed message has been sent to DR.

## 2015-08-20 NOTE — Patient Outreach (Signed)
Pt is having a fair day. She reports that the referral for transportation for Thursday did come through and she voices her appreciation for this assistance from [facility]. She says her brother came over and spent time with her today. She enjoys family visits this really lifts her spirits. I asked if there is any thing else I can do for her today and she says no. I told her I will check in again with her tomorrow.  Joann [name] [facility] [phone]

## 2015-08-20 NOTE — Telephone Encounter (Signed)
Spoke with pt and informed you weren't in office today. She is wanting the results and a copy of the sleep study by the weekend to take with her to the [facility] clinic. Please advise of study and I can give copy of it to pt. Thanks

## 2015-08-20 NOTE — Telephone Encounter (Signed)
Pt calling about wanting to know the results on sleep studies and would like a copy of them Please call.

## 2015-08-20 NOTE — Telephone Encounter (Signed)
Called and spoke to pt. Pt requesting records to take with her to [facility]. Informed her records have already been faxed to [facility] but we can also provider her a copy in case all records where not received by [facility] Records up front for pick up, pt aware. Pt also requesting the results of her most recent sleep study.   Dr. Ashby Dawes please advise on pt's sleep study from 07/2015. Thank you.

## 2015-08-21 ENCOUNTER — Emergency Department (HOSPITAL_COMMUNITY): Payer: 59

## 2015-08-21 ENCOUNTER — Inpatient Hospital Stay (HOSPITAL_COMMUNITY)
Admission: EM | Admit: 2015-08-21 | Discharge: 2015-08-27 | DRG: 196 | Disposition: A | Discharge status: Hospice | Payer: 59 | Attending: Internal Medicine | Admitting: Internal Medicine

## 2015-08-21 ENCOUNTER — Encounter (HOSPITAL_COMMUNITY): Payer: Self-pay | Admitting: Emergency Medicine

## 2015-08-21 ENCOUNTER — Telehealth: Payer: Self-pay | Admitting: Pulmonary Disease

## 2015-08-21 ENCOUNTER — Ambulatory Visit: Payer: 59 | Admitting: *Deleted

## 2015-08-21 DIAGNOSIS — J45909 Unspecified asthma, uncomplicated: Secondary | ICD-10-CM | POA: Diagnosis present

## 2015-08-21 DIAGNOSIS — G4733 Obstructive sleep apnea (adult) (pediatric): Secondary | ICD-10-CM | POA: Diagnosis present

## 2015-08-21 DIAGNOSIS — R000 Tachycardia, unspecified: Secondary | ICD-10-CM | POA: Diagnosis present

## 2015-08-21 DIAGNOSIS — I5033 Acute on chronic diastolic (congestive) heart failure: Secondary | ICD-10-CM | POA: Diagnosis not present

## 2015-08-21 DIAGNOSIS — I10 Essential (primary) hypertension: Secondary | ICD-10-CM | POA: Diagnosis present

## 2015-08-21 DIAGNOSIS — Z7952 Long term (current) use of systemic steroids: Secondary | ICD-10-CM | POA: Diagnosis not present

## 2015-08-21 DIAGNOSIS — M797 Fibromyalgia: Secondary | ICD-10-CM | POA: Diagnosis present

## 2015-08-21 DIAGNOSIS — K589 Irritable bowel syndrome without diarrhea: Secondary | ICD-10-CM | POA: Diagnosis present

## 2015-08-21 DIAGNOSIS — M7989 Other specified soft tissue disorders: Secondary | ICD-10-CM

## 2015-08-21 DIAGNOSIS — K760 Fatty (change of) liver, not elsewhere classified: Secondary | ICD-10-CM | POA: Diagnosis present

## 2015-08-21 DIAGNOSIS — Z6835 Body mass index (BMI) 35.0-35.9, adult: Secondary | ICD-10-CM

## 2015-08-21 DIAGNOSIS — Z66 Do not resuscitate: Secondary | ICD-10-CM | POA: Diagnosis present

## 2015-08-21 DIAGNOSIS — Z85528 Personal history of other malignant neoplasm of kidney: Secondary | ICD-10-CM | POA: Diagnosis not present

## 2015-08-21 DIAGNOSIS — J9622 Acute and chronic respiratory failure with hypercapnia: Secondary | ICD-10-CM

## 2015-08-21 DIAGNOSIS — J849 Interstitial pulmonary disease, unspecified: Secondary | ICD-10-CM

## 2015-08-21 DIAGNOSIS — E119 Type 2 diabetes mellitus without complications: Secondary | ICD-10-CM

## 2015-08-21 DIAGNOSIS — Z9981 Dependence on supplemental oxygen: Secondary | ICD-10-CM | POA: Diagnosis not present

## 2015-08-21 DIAGNOSIS — Z79890 Hormone replacement therapy (postmenopausal): Secondary | ICD-10-CM

## 2015-08-21 DIAGNOSIS — R0602 Shortness of breath: Secondary | ICD-10-CM | POA: Diagnosis not present

## 2015-08-21 DIAGNOSIS — I110 Hypertensive heart disease with heart failure: Secondary | ICD-10-CM | POA: Diagnosis present

## 2015-08-21 DIAGNOSIS — F329 Major depressive disorder, single episode, unspecified: Secondary | ICD-10-CM | POA: Diagnosis present

## 2015-08-21 DIAGNOSIS — J9620 Acute and chronic respiratory failure, unspecified whether with hypoxia or hypercapnia: Secondary | ICD-10-CM | POA: Diagnosis not present

## 2015-08-21 DIAGNOSIS — R609 Edema, unspecified: Secondary | ICD-10-CM

## 2015-08-21 DIAGNOSIS — T380X5A Adverse effect of glucocorticoids and synthetic analogues, initial encounter: Secondary | ICD-10-CM | POA: Diagnosis present

## 2015-08-21 DIAGNOSIS — Z79899 Other long term (current) drug therapy: Secondary | ICD-10-CM | POA: Diagnosis not present

## 2015-08-21 DIAGNOSIS — J9621 Acute and chronic respiratory failure with hypoxia: Secondary | ICD-10-CM | POA: Diagnosis present

## 2015-08-21 DIAGNOSIS — E1165 Type 2 diabetes mellitus with hyperglycemia: Secondary | ICD-10-CM | POA: Diagnosis present

## 2015-08-21 DIAGNOSIS — E039 Hypothyroidism, unspecified: Secondary | ICD-10-CM | POA: Diagnosis present

## 2015-08-21 DIAGNOSIS — J9690 Respiratory failure, unspecified, unspecified whether with hypoxia or hypercapnia: Secondary | ICD-10-CM

## 2015-08-21 DIAGNOSIS — Z794 Long term (current) use of insulin: Secondary | ICD-10-CM

## 2015-08-21 DIAGNOSIS — R0600 Dyspnea, unspecified: Secondary | ICD-10-CM | POA: Diagnosis present

## 2015-08-21 DIAGNOSIS — J84112 Idiopathic pulmonary fibrosis: Secondary | ICD-10-CM | POA: Diagnosis present

## 2015-08-21 DIAGNOSIS — K219 Gastro-esophageal reflux disease without esophagitis: Secondary | ICD-10-CM | POA: Diagnosis present

## 2015-08-21 DIAGNOSIS — D649 Anemia, unspecified: Secondary | ICD-10-CM | POA: Diagnosis present

## 2015-08-21 DIAGNOSIS — F419 Anxiety disorder, unspecified: Secondary | ICD-10-CM | POA: Diagnosis present

## 2015-08-21 DIAGNOSIS — Z7984 Long term (current) use of oral hypoglycemic drugs: Secondary | ICD-10-CM | POA: Diagnosis not present

## 2015-08-21 DIAGNOSIS — Z515 Encounter for palliative care: Secondary | ICD-10-CM | POA: Diagnosis not present

## 2015-08-21 DIAGNOSIS — E1122 Type 2 diabetes mellitus with diabetic chronic kidney disease: Secondary | ICD-10-CM | POA: Diagnosis not present

## 2015-08-21 LAB — CBC
HCT: 36.4 % (ref 36.0–46.0)
Hemoglobin: 10.8 g/dL — ABNORMAL LOW (ref 12.0–15.0)
MCH: 25 pg — ABNORMAL LOW (ref 26.0–34.0)
MCHC: 29.7 g/dL — AB (ref 30.0–36.0)
MCV: 84.3 fL (ref 78.0–100.0)
PLATELETS: 364 10*3/uL (ref 150–400)
RBC: 4.32 MIL/uL (ref 3.87–5.11)
RDW: 18 % — AB (ref 11.5–15.5)
WBC: 14.5 10*3/uL — AB (ref 4.0–10.5)

## 2015-08-21 LAB — COMPREHENSIVE METABOLIC PANEL
ALBUMIN: 2.9 g/dL — AB (ref 3.5–5.0)
ALT: 28 U/L (ref 14–54)
ANION GAP: 10 (ref 5–15)
AST: 19 U/L (ref 15–41)
Alkaline Phosphatase: 82 U/L (ref 38–126)
BUN: 9 mg/dL (ref 6–20)
CHLORIDE: 100 mmol/L — AB (ref 101–111)
CO2: 27 mmol/L (ref 22–32)
Calcium: 8.9 mg/dL (ref 8.9–10.3)
Creatinine, Ser: 0.98 mg/dL (ref 0.44–1.00)
GFR calc Af Amer: >60 mL/min (ref >60)
GLUCOSE: 223 mg/dL — AB (ref 65–99)
POTASSIUM: 3.5 mmol/L (ref 3.5–5.1)
Sodium: 137 mmol/L (ref 135–145)
Total Bilirubin: 0.4 mg/dL (ref 0.3–1.2)
Total Protein: 5.9 g/dL — ABNORMAL LOW (ref 6.5–8.1)

## 2015-08-21 LAB — I-STAT TROPONIN, ED: Troponin i, poc: 0.01 ng/mL (ref 0.00–0.08)

## 2015-08-21 LAB — BRAIN NATRIURETIC PEPTIDE: B NATRIURETIC PEPTIDE 5: 21.6 pg/mL (ref 0.0–100.0)

## 2015-08-21 MED ORDER — LEVOFLOXACIN IN D5W 750 MG/150ML IV SOLN
750.0000 mg | Freq: Once | INTRAVENOUS | Status: AC
  Administered 2015-08-21: 750 mg via INTRAVENOUS
  Filled 2015-08-21: qty 150

## 2015-08-21 NOTE — ED Notes (Signed)
Admitting doctor at the bedside 

## 2015-08-21 NOTE — ED Notes (Signed)
Onset 3 days ago increased shortness of breath felt better then returned today. Stated temperature 99.29F. States nose pain due to oxygen. On home oxygen for pulmonary fibrosis. 8-10L [place].

## 2015-08-21 NOTE — Telephone Encounter (Signed)
Patient called with fevers, chills. Recorded temp of 99.5. O2 sats are 82% on 10L O2. I advised her to go to the ED for evaluation.

## 2015-08-21 NOTE — ED Provider Notes (Signed)
CSN: [id number]     Arrival date & time 08/21/15  1918 History   First MD Initiated Contact with Patient 08/21/15 2145     Chief Complaint  Patient presents with  . Fever  . Shortness of Breath    Patient is a 54 y.o. female presenting with shortness of breath. The history is provided by the patient, the spouse and medical records.  Shortness of Breath Severity:  Severe Onset quality:  Gradual Duration:  3 days Timing:  Constant Progression:  Worsening Chronicity:  Recurrent Context comment:  Hx ILD on home 8L [place], worsening respiratory status with increased productive sputum and increased o2 requirements as well as desats to 50-60% when making small transfers in home Relieved by:  Rest and oxygen Worsened by:  Exertion and coughing Associated symptoms: cough, fever ("low grade") and wheezing   Associated symptoms: no abdominal pain, no chest pain, no neck pain, no rash and no vomiting     Past Medical History  Diagnosis Date  . Asthma   . Diabetes mellitus   . Headache(784.0)     MIGRAINES  . Fibromyalgia   . GERD (gastroesophageal reflux disease)   . IBS (irritable bowel syndrome)   . Renal mass, right   . IC (interstitial cystitis)   . Frequency of urination   . Anemia   . Swelling of both lower extremities     TAKES MAXIDE FOR SWELLING (DENIES HBP)  . Hypothyroidism   . Depression   . Cancer ([place])     kidney  . Hypertension   . Wears glasses   . Sleep apnea     uses a cpap  . Pulmonary fibrosis ([place])   . Pneumonia   . Fatty liver   . Oxygen dependent     2 Liters at rest, 3 liters with activity   Past Surgical History  Procedure Laterality Date  . Cesarean section  1983, 1985     x 2  . Bladder tack    . Cystocopy    . Cystoscopy    . Robotic assited partial nephrectomy Right 12/14/2013    Procedure: ROBOTIC ASSITED PARTIAL NEPHRECTOMY;  Surgeon: Dutch Gray, MD;  Location: [facility];  Service: Urology;  Laterality: Right;  . Cystoscopy w/ ureteral stent  placement Right 12/14/2013    Procedure: CYSTOSCOPY WITH RETROGRADE PYELOGRAM/URETERAL STENT PLACEMENT;  Surgeon: Dutch Gray, MD;  Location: [facility];  Service: Urology;  Laterality: Right;  . Cervical laminectomy  07/2012    anterior with plates and screws.  . Abdominal hysterectomy  1996  . Cholecystectomy  1995    tubal ligation also  . Posterior cervical laminectomy  2004  . Tubal ligation    . Colonoscopy    . Incisional hernia repair N/A 09/12/2014    Procedure: HERNIA REPAIR INCISIONAL;  Surgeon: Coralie Keens, MD;  Location: [place];  Service: General;  Laterality: N/A;  . Insertion of mesh N/A 09/12/2014    Procedure: INSERTION OF MESH;  Surgeon: Coralie Keens, MD;  Location: [place];  Service: General;  Laterality: N/A;  . Video assisted thoracoscopy Right 03/21/2015    Procedure: VIDEO ASSISTED THORACOSCOPY;  Surgeon: Gaye Pollack, MD;  Location: [facility];  Service: Thoracic;  Laterality: Right;  . Lung biopsy Right 03/21/2015    Procedure: Right Upper and Lower Lobe Lung Wedge BIOPSY;  Surgeon: Gaye Pollack, MD;  Location: [place] OR;  Service: Thoracic;  Laterality: Right;   Family History  Problem Relation  Age of Onset  . Cancer Mother     breast, lung  . COPD Mother   . Hyperlipidemia Mother   . Other Mother     BOOP  . COPD Father   . Hypertension Father   . Hyperlipidemia Father   . Stroke Father    Social History  Substance Use Topics  . Smoking status: Never Smoker   . Smokeless tobacco: Never Used  . Alcohol Use: No   OB History    No data available     Review of Systems  Constitutional: Positive for fever ("low grade").  HENT: Negative for rhinorrhea.   Eyes: Negative for visual disturbance.  Respiratory: Positive for cough, shortness of breath and wheezing.   Cardiovascular: Positive for palpitations. Negative for chest pain.  Gastrointestinal: Negative for vomiting and abdominal pain.  Genitourinary: Negative for  decreased urine volume.  Musculoskeletal: Negative for neck pain.  Skin: Negative for rash.  Neurological: Negative for syncope.  Psychiatric/Behavioral: Negative for confusion.    Allergies  Other; Peanut-containing drug products; Peanuts; Pecan pollen; Sulfa antibiotics; Betadine; and Latex  Home Medications   Prior to Admission medications   Medication Sig Start Date End Date Taking? Authorizing Provider  ALPRAZolam Duanne Moron) 0.5 MG tablet Take 0.5 mg by mouth daily as needed for anxiety.    Yes Historical Provider, MD  cholecalciferol (VITAMIN D) 1000 UNITS tablet Take 1,000 Units by mouth every morning.    Yes Historical Provider, MD  dicyclomine (BENTYL) 20 MG tablet Take 20 mg by mouth every 6 (six) hours.   Yes Historical Provider, MD  DULoxetine (CYMBALTA) 60 MG capsule Take 60 mg by mouth every morning.    Yes Historical Provider, MD  esomeprazole (NEXIUM) 40 MG capsule Take 40 mg by mouth 2 (two) times daily before a meal.    Yes Historical Provider, MD  estrogens, conjugated, (PREMARIN) 0.625 MG tablet Take 0.625 mg by mouth every morning.    Yes Historical Provider, MD  guaiFENesin (MUCINEX) 600 MG 12 hr tablet Take 1 tablet (600 mg total) by mouth 2 (two) times daily. Patient taking differently: Take 600 mg by mouth 2 (two) times daily as needed for cough or to loosen phlegm.  03/25/15  Yes Erin R Barrett, PA-C  insulin aspart (NOVOLOG) 100 UNIT/ML injection Inject 0-9 Units into the skin 3 (three) times daily with meals. Patient taking differently: Inject 15 Units into the skin 3 (three) times daily with meals. Can adjust dose up or down according to meal size. 05/20/15  Yes Corey Harold, NP  insulin glargine (LANTUS) 100 UNIT/ML injection Inject 0.45 mLs (45 Units total) into the skin at bedtime. 05/20/15  Yes Corey Harold, NP  levothyroxine (SYNTHROID, LEVOTHROID) 200 MCG tablet Take 200 mcg by mouth daily before breakfast.   Yes Historical Provider, MD  lisinopril  (PRINIVIL,ZESTRIL) 20 MG tablet Take 20 mg by mouth daily.   Yes Historical Provider, MD  metFORMIN (GLUCOPHAGE-XR) 500 MG 24 hr tablet Take 2 tablets by mouth 2 (two) times daily. 07/22/15  Yes Historical Provider, MD  Multiple Vitamins-Minerals (MULTIVITAMIN GUMMIES WOMENS PO) Take by mouth daily.   Yes Historical Provider, MD  Pirfenidone (ESBRIET) 267 MG CAPS 3 caps TID Patient taking differently: Take 3 capsules by mouth 3 (three) times daily. 3 caps TID 05/29/15  Yes Brand Males, MD  potassium chloride SA (K-DUR,KLOR-CON) 20 MEQ tablet Take 20 mEq by mouth daily.   Yes Historical Provider, MD  predniSONE (DELTASONE) 20 MG tablet Take  2 tablets (40 mg total) by mouth daily with breakfast. 07/18/15  Yes Brand Males, MD  topiramate (TOPAMAX) 25 MG tablet Take 25 mg by mouth 2 (two) times daily.   Yes Historical Provider, MD  traZODone (DESYREL) 150 MG tablet Take 150 mg by mouth at bedtime.    Yes Historical Provider, MD  vitamin E 400 UNIT capsule Take 400 Units by mouth 2 (two) times daily.    Yes Historical Provider, MD  albuterol (PROVENTIL) (2.5 MG/3ML) 0.083% nebulizer solution Take 3 mLs (2.5 mg total) by nebulization every 2 (two) hours as needed for wheezing. 05/20/15   Corey Harold, NP  ipratropium-albuterol (DUONEB) 0.5-2.5 (3) MG/3ML SOLN Take 3 mLs by nebulization every 6 (six) hours. 05/20/15   Corey Harold, NP  meclizine (ANTIVERT) 25 MG tablet Take 25 mg by mouth 3 (three) times daily as needed for dizziness.    Historical Provider, MD  ondansetron (ZOFRAN) 4 MG tablet Take 4 mg by mouth every 8 (eight) hours as needed for nausea or vomiting.    Historical Provider, MD  oxyCODONE (OXY IR/ROXICODONE) 5 MG immediate release tablet Take 1-2 tablets (5-10 mg total) by mouth every 4 (four) hours as needed for severe pain. 04/19/15   Gaye Pollack, MD   BP 126/78 mmHg  Pulse 108  Temp(Src) 98.6 F (37 C) (Oral)  Resp 28  Ht 5' (1.524 m)  Wt 182 lb (82.555 kg)  BMI 35.54  kg/m2  SpO2 89% Physical Exam  Constitutional: She is oriented to person, place, and time. She appears well-developed and well-nourished.  Chronically ill appearing, on high flow o2 with humidifier   HENT:  Head: Normocephalic and atraumatic.  Eyes: Right eye exhibits no discharge. Left eye exhibits no discharge.  Neck: No tracheal deviation present.  Cardiovascular: Regular rhythm.  Tachycardia present.   Pulmonary/Chest: She is in respiratory distress. She has wheezes. She has rales.  Abdominal: Soft. She exhibits no distension. There is no tenderness.  Musculoskeletal: She exhibits no tenderness.  Neurological: She is alert and oriented to person, place, and time.  Skin: Skin is warm and dry.  Psychiatric: She has a normal mood and affect. Her behavior is normal.  Nursing note and vitals reviewed.   ED Course  Procedures (including critical care time) Labs Review Labs Reviewed  CBC - Abnormal; Notable for the following:    WBC 14.5 (*)    Hemoglobin 10.8 (*)    MCH 25.0 (*)    MCHC 29.7 (*)    RDW 18.0 (*)    All other components within normal limits  COMPREHENSIVE METABOLIC PANEL - Abnormal; Notable for the following:    Chloride 100 (*)    Glucose, Bld 223 (*)    Total Protein 5.9 (*)    Albumin 2.9 (*)    All other components within normal limits  BRAIN NATRIURETIC PEPTIDE  I-STAT TROPOININ, ED    Imaging Review Dg Chest 2 View  08/21/2015  CLINICAL DATA:  Pulmonary fibrosis.  Increasing shortness of breath EXAM: CHEST  2 VIEW COMPARISON:  August 05, 2015 FINDINGS: Widespread pulmonary fibrosis is present bilaterally, stable. There is no new opacities to suggest superimposed edema or consolidation. Heart is upper normal in size with pulmonary vascularity within normal limits. No adenopathy. There is postoperative change in the lower cervical spine. IMPRESSION: Widespread interstitial fibrosis, stable. No new opacity apparent. No change in cardiac silhouette. It  must be cautioned that subtle superimposed pneumonia could easily be obscured by  this degree of extensive fibrosis. Electronically Signed   By: Lowella Grip III M.D.   On: 08/21/2015 21:16   I have personally reviewed and evaluated these images and lab results as part of my medical decision-making.   EKG Interpretation None      MDM   Final diagnoses:  Acute on chronic respiratory failure with hypoxia ([place])    54 year old female with history of interstitial lung disease/pulmonary fibrosis on home o2, chronic respiratory failure, OSA on CPAP, diabetes, hypertension, hypothyroidism presenting with acute on chronic respiratory distress. Borderline temp, tachycardic to 130s, with increased oxygen requirement even at rest. Presentation concerning for acute on chronic respiratory failure, unclear if from worsening of interstitial lung disease versus superimposed early pneumonia. Covered with levaquin given ongoing leukocytosis, possible low grade fever, increased sputum. PE possible although patient has had numerous CT angiograms this year, will defer further workup for embolus to inpatient team if desired. Admitted to stepdown unit for further management.   Case discussed with Dr. Oleta Mouse, who oversaw management of this patient.   Ivin Booty, MD 08/22/15 0100  Forde Dandy, MD 08/22/15 1352

## 2015-08-22 ENCOUNTER — Ambulatory Visit: Payer: 59 | Admitting: *Deleted

## 2015-08-22 ENCOUNTER — Encounter (HOSPITAL_COMMUNITY): Admission: RE | Admit: 2015-08-22 | Payer: 59 | Source: Ambulatory Visit

## 2015-08-22 ENCOUNTER — Encounter (HOSPITAL_COMMUNITY): Payer: Self-pay | Admitting: Radiology

## 2015-08-22 ENCOUNTER — Inpatient Hospital Stay (HOSPITAL_COMMUNITY): Payer: 59

## 2015-08-22 DIAGNOSIS — E1122 Type 2 diabetes mellitus with diabetic chronic kidney disease: Secondary | ICD-10-CM

## 2015-08-22 DIAGNOSIS — J9622 Acute and chronic respiratory failure with hypercapnia: Secondary | ICD-10-CM

## 2015-08-22 DIAGNOSIS — J9620 Acute and chronic respiratory failure, unspecified whether with hypoxia or hypercapnia: Secondary | ICD-10-CM

## 2015-08-22 DIAGNOSIS — J84112 Idiopathic pulmonary fibrosis: Principal | ICD-10-CM

## 2015-08-22 DIAGNOSIS — J9621 Acute and chronic respiratory failure with hypoxia: Secondary | ICD-10-CM

## 2015-08-22 DIAGNOSIS — D649 Anemia, unspecified: Secondary | ICD-10-CM

## 2015-08-22 LAB — CBC
HCT: 33.6 % — ABNORMAL LOW (ref 36.0–46.0)
HEMOGLOBIN: 10 g/dL — AB (ref 12.0–15.0)
MCH: 25.1 pg — AB (ref 26.0–34.0)
MCHC: 29.8 g/dL — ABNORMAL LOW (ref 30.0–36.0)
MCV: 84.2 fL (ref 78.0–100.0)
PLATELETS: 309 10*3/uL (ref 150–400)
RBC: 3.99 MIL/uL (ref 3.87–5.11)
RDW: 18.1 % — ABNORMAL HIGH (ref 11.5–15.5)
WBC: 13 10*3/uL — AB (ref 4.0–10.5)

## 2015-08-22 LAB — D-DIMER, QUANTITATIVE: D-Dimer, Quant: 1.42 ug/mL-FEU — ABNORMAL HIGH (ref 0.00–0.48)

## 2015-08-22 LAB — GLUCOSE, CAPILLARY
GLUCOSE-CAPILLARY: 243 mg/dL — AB (ref 65–99)
GLUCOSE-CAPILLARY: 326 mg/dL — AB (ref 65–99)
Glucose-Capillary: 136 mg/dL — ABNORMAL HIGH (ref 65–99)
Glucose-Capillary: 265 mg/dL — ABNORMAL HIGH (ref 65–99)
Glucose-Capillary: 289 mg/dL — ABNORMAL HIGH (ref 65–99)

## 2015-08-22 LAB — TSH: TSH: 0.691 u[IU]/mL (ref 0.350–4.500)

## 2015-08-22 LAB — BASIC METABOLIC PANEL
ANION GAP: 8 (ref 5–15)
BUN: 9 mg/dL (ref 6–20)
CALCIUM: 8.4 mg/dL — AB (ref 8.9–10.3)
CO2: 29 mmol/L (ref 22–32)
CREATININE: 0.88 mg/dL (ref 0.44–1.00)
Chloride: 102 mmol/L (ref 101–111)
Glucose, Bld: 128 mg/dL — ABNORMAL HIGH (ref 65–99)
Potassium: 3.3 mmol/L — ABNORMAL LOW (ref 3.5–5.1)
SODIUM: 139 mmol/L (ref 135–145)

## 2015-08-22 LAB — MRSA PCR SCREENING: MRSA BY PCR: NEGATIVE

## 2015-08-22 MED ORDER — INSULIN ASPART 100 UNIT/ML ~~LOC~~ SOLN
0.0000 [IU] | Freq: Every day | SUBCUTANEOUS | Status: DC
Start: 2015-08-22 — End: 2015-08-27
  Administered 2015-08-22: 3 [IU] via SUBCUTANEOUS
  Administered 2015-08-23 – 2015-08-24 (×2): 4 [IU] via SUBCUTANEOUS

## 2015-08-22 MED ORDER — PIRFENIDONE 267 MG PO CAPS
3.0000 | ORAL_CAPSULE | Freq: Three times a day (TID) | ORAL | Status: DC
Start: 2015-08-22 — End: 2015-08-23
  Administered 2015-08-22 (×3): 3 via ORAL
  Filled 2015-08-22 (×3): qty 3

## 2015-08-22 MED ORDER — INSULIN GLARGINE 100 UNIT/ML ~~LOC~~ SOLN
45.0000 [IU] | Freq: Every day | SUBCUTANEOUS | Status: DC
  Administered 2015-08-22 (×2): 45 [IU] via SUBCUTANEOUS
  Filled 2015-08-22 (×3): qty 0.45

## 2015-08-22 MED ORDER — GUAIFENESIN ER 600 MG PO TB12
600.0000 mg | ORAL_TABLET | Freq: Two times a day (BID) | ORAL | Status: DC | PRN
  Administered 2015-08-24: 600 mg via ORAL
  Filled 2015-08-22: qty 1

## 2015-08-22 MED ORDER — PANTOPRAZOLE SODIUM 40 MG PO TBEC
40.0000 mg | DELAYED_RELEASE_TABLET | Freq: Every day | ORAL | Status: DC
  Administered 2015-08-22 – 2015-08-24 (×3): 40 mg via ORAL
  Filled 2015-08-22 (×4): qty 1

## 2015-08-22 MED ORDER — ALPRAZOLAM 0.5 MG PO TABS
0.5000 mg | ORAL_TABLET | Freq: Every day | ORAL | Status: DC | PRN
  Administered 2015-08-22 (×2): 0.5 mg via ORAL
  Filled 2015-08-22 (×2): qty 1

## 2015-08-22 MED ORDER — DULOXETINE HCL 60 MG PO CPEP
60.0000 mg | ORAL_CAPSULE | Freq: Every morning | ORAL | Status: DC
  Administered 2015-08-22 – 2015-08-27 (×6): 60 mg via ORAL
  Filled 2015-08-22 (×6): qty 1

## 2015-08-22 MED ORDER — DICYCLOMINE HCL 20 MG PO TABS
20.0000 mg | ORAL_TABLET | Freq: Four times a day (QID) | ORAL | Status: DC
  Administered 2015-08-22 – 2015-08-27 (×21): 20 mg via ORAL
  Filled 2015-08-22 (×27): qty 1

## 2015-08-22 MED ORDER — LISINOPRIL 20 MG PO TABS: 20.0000 mg | ORAL_TABLET | Freq: Every day | ORAL | Status: DC

## 2015-08-22 MED ORDER — TRAZODONE HCL 50 MG PO TABS
150.0000 mg | ORAL_TABLET | Freq: Every day | ORAL | Status: DC
  Administered 2015-08-22 – 2015-08-26 (×6): 150 mg via ORAL
  Filled 2015-08-22 (×7): qty 3

## 2015-08-22 MED ORDER — SODIUM CHLORIDE 0.9 % IJ SOLN
3.0000 mL | Freq: Two times a day (BID) | INTRAMUSCULAR | Status: DC
  Administered 2015-08-22 – 2015-08-27 (×8): 3 mL via INTRAVENOUS

## 2015-08-22 MED ORDER — SODIUM CHLORIDE 0.9 % IV SOLN
250.0000 mg | Freq: Four times a day (QID) | INTRAVENOUS | Status: DC
  Administered 2015-08-22 (×2): 250 mg via INTRAVENOUS
  Filled 2015-08-22 (×5): qty 2

## 2015-08-22 MED ORDER — ALPRAZOLAM 0.5 MG PO TABS
0.5000 mg | ORAL_TABLET | Freq: Two times a day (BID) | ORAL | Status: DC | PRN
  Administered 2015-08-22 (×2): 0.5 mg via ORAL
  Filled 2015-08-22 (×2): qty 1

## 2015-08-22 MED ORDER — INSULIN ASPART 100 UNIT/ML ~~LOC~~ SOLN
0.0000 [IU] | Freq: Three times a day (TID) | SUBCUTANEOUS | Status: DC
  Administered 2015-08-22: 5 [IU] via SUBCUTANEOUS
  Administered 2015-08-22: 8 [IU] via SUBCUTANEOUS
  Administered 2015-08-22: 11 [IU] via SUBCUTANEOUS
  Administered 2015-08-23: 8 [IU] via SUBCUTANEOUS
  Administered 2015-08-23: 15 [IU] via SUBCUTANEOUS
  Administered 2015-08-23: 5 [IU] via SUBCUTANEOUS
  Administered 2015-08-24: 11 [IU] via SUBCUTANEOUS
  Administered 2015-08-24 – 2015-08-25 (×2): 8 [IU] via SUBCUTANEOUS
  Administered 2015-08-25: 11 [IU] via SUBCUTANEOUS
  Administered 2015-08-26: 15 [IU] via SUBCUTANEOUS
  Administered 2015-08-26: 11 [IU] via SUBCUTANEOUS
  Administered 2015-08-26: 5 [IU] via SUBCUTANEOUS

## 2015-08-22 MED ORDER — VANCOMYCIN HCL 10 G IV SOLR
1500.0000 mg | Freq: Once | INTRAVENOUS | Status: AC
  Administered 2015-08-22: 1500 mg via INTRAVENOUS
  Filled 2015-08-22 (×2): qty 1500

## 2015-08-22 MED ORDER — POTASSIUM CHLORIDE CRYS ER 20 MEQ PO TBCR
20.0000 meq | EXTENDED_RELEASE_TABLET | Freq: Every day | ORAL | Status: DC
  Administered 2015-08-22 – 2015-08-27 (×6): 20 meq via ORAL
  Filled 2015-08-22 (×6): qty 1

## 2015-08-22 MED ORDER — PIPERACILLIN-TAZOBACTAM 3.375 G IVPB
3.3750 g | Freq: Three times a day (TID) | INTRAVENOUS | Status: DC
  Administered 2015-08-22 – 2015-08-26 (×13): 3.375 g via INTRAVENOUS
  Filled 2015-08-22 (×14): qty 50

## 2015-08-22 MED ORDER — TOPIRAMATE 25 MG PO TABS
25.0000 mg | ORAL_TABLET | Freq: Two times a day (BID) | ORAL | Status: DC
  Administered 2015-08-22 – 2015-08-27 (×11): 25 mg via ORAL
  Filled 2015-08-22 (×15): qty 1

## 2015-08-22 MED ORDER — IOHEXOL 350 MG/ML SOLN
100.0000 mL | Freq: Once | INTRAVENOUS | Status: AC | PRN
  Administered 2015-08-22: 100 mL via INTRAVENOUS

## 2015-08-22 MED ORDER — LEVOFLOXACIN IN D5W 750 MG/150ML IV SOLN: 750.0000 mg | INTRAVENOUS | Status: DC

## 2015-08-22 MED ORDER — MORPHINE SULFATE (PF) 2 MG/ML IV SOLN
1.0000 mg | INTRAVENOUS | Status: DC | PRN
  Administered 2015-08-22: 1 mg via INTRAVENOUS
  Administered 2015-08-22 – 2015-08-25 (×12): 2 mg via INTRAVENOUS
  Filled 2015-08-22 (×13): qty 1

## 2015-08-22 MED ORDER — METFORMIN HCL ER 500 MG PO TB24
1000.0000 mg | ORAL_TABLET | Freq: Two times a day (BID) | ORAL | Status: DC
  Filled 2015-08-22 (×3): qty 2

## 2015-08-22 MED ORDER — ENOXAPARIN SODIUM 40 MG/0.4ML ~~LOC~~ SOLN
40.0000 mg | SUBCUTANEOUS | Status: DC
  Administered 2015-08-22 – 2015-08-27 (×6): 40 mg via SUBCUTANEOUS
  Filled 2015-08-22 (×6): qty 0.4

## 2015-08-22 MED ORDER — METHYLPREDNISOLONE SODIUM SUCC 125 MG IJ SOLR
125.0000 mg | Freq: Two times a day (BID) | INTRAMUSCULAR | Status: DC
  Administered 2015-08-22 – 2015-08-23 (×3): 125 mg via INTRAVENOUS
  Filled 2015-08-22 (×3): qty 2

## 2015-08-22 MED ORDER — LEVOTHYROXINE SODIUM 100 MCG PO TABS
200.0000 ug | ORAL_TABLET | Freq: Every day | ORAL | Status: DC
  Administered 2015-08-22 – 2015-08-27 (×6): 200 ug via ORAL
  Filled 2015-08-22 (×7): qty 2

## 2015-08-22 MED ORDER — SODIUM CHLORIDE 0.9 % IV SOLN: INTRAVENOUS | Status: DC

## 2015-08-22 NOTE — Progress Notes (Signed)
Pt became very anxious and starting to panic on the the bipap and asked to put the [place] back on.  RN put pt back on [place], sats dropped to the 40's and slowly crept up to the 80's.  Pt is settling down and satting between 84 and 86 on 12 L of high flow oxygen.  Pt states that her Dr told her to aim for keeping her sats above 75 when at home.  RN unsure of what parameters are wanted here concerning her oxygen saturation. RN will continue to monitor.

## 2015-08-22 NOTE — Progress Notes (Signed)
Patient placed on CPAP and she is tolerating it well. RT will continue to monitor.

## 2015-08-22 NOTE — Consult Note (Addendum)
   [facility] CM Inpatient Consult   08/22/2015  Joann [name] April 19, 1961 [id number]   Patient active with [employer] services on behalf of [facility] insurance. Went to bedside to speak with her to make her aware [facility] will continue to follow. Please see chart review tab then notes in EPIC to see [facility] correspondence with patient. Will update [facility] RN [employer]. Will also make inpatient RNCM aware [facility] is active. She was also to have lung transplant evaluation on 08/26/15 at the [facility] in [place]. Will continue to follow.     Marthenia Rolling, MSN-Ed, RN,BSN [facility] Liaison [phone]

## 2015-08-22 NOTE — Progress Notes (Signed)
Results for SETAREH, ROM (MRN [id number]) as of 08/22/2015 14:45  Ref. Range 08/22/2015 08:23 08/22/2015 12:06  Glucose-Capillary Latest Ref Range: 65-99 mg/dL 243 (H) 265 (H)  Noted that CBGs continue to be greater than 180 mg/dl.  Recommend adding Novolog 6-8 units TID as meal coverage if eating at least 50% of meals and while on steroids.  Patient does take Novolog 15 units TID with meals at home, depending on size of meal. Will continue to monitor blood sugars while in hospital. Harvel Ricks RN BSN CDE

## 2015-08-22 NOTE — Progress Notes (Signed)
ANTIBIOTIC CONSULT NOTE - INITIAL  Pharmacy Consult for vancomycin, zosyn  Indication: pneumonia  Allergies  Allergen Reactions  . Other Anaphylaxis    WALNUTS  . Peanut-Containing Drug Products Shortness Of Breath    Pecans, walnuts as well  . Peanuts [Peanut Oil] Shortness Of Breath  . Pecan Pollen Shortness Of Breath  . Sulfa Antibiotics Hives and Shortness Of Breath  . Betadine [Povidone Iodine] Rash  . Latex Rash    Patient Measurements: Height: 5' (152.4 cm) Weight: 186 lb 11.7 oz (84.7 kg) IBW/kg (Calculated) : 45.5 Adjusted Body Weight:   Vital Signs: Temp: 98.2 F (36.8 C) (10/13 1208) Temp Source: Axillary (10/13 1208) BP: 128/87 mmHg (10/13 1232) Pulse Rate: 98 (10/13 1232) Intake/Output from previous day: 10/12 0701 - 10/13 0700 In: -  Out: 350 [Urine:350] Intake/Output from this shift:    Labs:  Recent Labs  08/21/15 2018 08/22/15 0354  WBC 14.5* 13.0*  HGB 10.8* 10.0*  PLT 364 309  CREATININE 0.98 0.88   Estimated Creatinine Clearance: 69.8 mL/min (by C-G formula based on Cr of 0.88). No results for input(s): VANCOTROUGH, VANCOPEAK, VANCORANDOM, GENTTROUGH, GENTPEAK, GENTRANDOM, TOBRATROUGH, TOBRAPEAK, TOBRARND, AMIKACINPEAK, AMIKACINTROU, AMIKACIN in the last 72 hours.   Microbiology: Recent Results (from the past 720 hour(s))  MRSA PCR Screening     Status: None   Collection Time: 08/22/15  1:23 AM  Result Value Ref Range Status   MRSA by PCR NEGATIVE NEGATIVE Final    Comment:        The GeneXpert MRSA Assay (FDA approved for NASAL specimens only), is one component of a comprehensive MRSA colonization surveillance program. It is not intended to diagnose MRSA infection nor to guide or monitor treatment for MRSA infections.     Medical History: Past Medical History  Diagnosis Date  . Asthma   . Diabetes mellitus   . Headache(784.0)     MIGRAINES  . Fibromyalgia   . GERD (gastroesophageal reflux disease)   . IBS (irritable  bowel syndrome)   . Renal mass, right   . IC (interstitial cystitis)   . Frequency of urination   . Anemia   . Swelling of both lower extremities     TAKES MAXIDE FOR SWELLING (DENIES HBP)  . Hypothyroidism   . Depression   . Cancer ([place])     kidney  . Hypertension   . Wears glasses   . Sleep apnea     uses a cpap  . Pulmonary fibrosis ([place])   . Pneumonia   . Fatty liver   . Oxygen dependent     2 Liters at rest, 3 liters with activity    Medications:  Prescriptions prior to admission  Medication Sig Dispense Refill Last Dose  . ALPRAZolam (XANAX) 0.5 MG tablet Take 0.5 mg by mouth daily as needed for anxiety.    08/21/2015 at Unknown time  . cholecalciferol (VITAMIN D) 1000 UNITS tablet Take 1,000 Units by mouth every morning.    08/21/2015 at Unknown time  . dicyclomine (BENTYL) 20 MG tablet Take 20 mg by mouth every 6 (six) hours.   08/21/2015 at Unknown time  . DULoxetine (CYMBALTA) 60 MG capsule Take 60 mg by mouth every morning.    08/21/2015 at Unknown time  . esomeprazole (NEXIUM) 40 MG capsule Take 40 mg by mouth 2 (two) times daily before a meal.    08/21/2015 at Unknown time  . estrogens, conjugated, (PREMARIN) 0.625 MG tablet Take 0.625 mg by mouth every morning.  08/21/2015 at Unknown time  . guaiFENesin (MUCINEX) 600 MG 12 hr tablet Take 1 tablet (600 mg total) by mouth 2 (two) times daily. (Patient taking differently: Take 600 mg by mouth 2 (two) times daily as needed for cough or to loosen phlegm. )   08/21/2015 at Unknown time  . insulin aspart (NOVOLOG) 100 UNIT/ML injection Inject 0-9 Units into the skin 3 (three) times daily with meals. (Patient taking differently: Inject 15 Units into the skin 3 (three) times daily with meals. Can adjust dose up or down according to meal size.) 10 mL 11 08/21/2015 at Unknown time  . insulin glargine (LANTUS) 100 UNIT/ML injection Inject 0.45 mLs (45 Units total) into the skin at bedtime. 10 mL 11 08/20/2015 at Unknown time  .  levothyroxine (SYNTHROID, LEVOTHROID) 200 MCG tablet Take 200 mcg by mouth daily before breakfast.   08/21/2015 at Unknown time  . lisinopril (PRINIVIL,ZESTRIL) 20 MG tablet Take 20 mg by mouth daily.   08/21/2015 at Unknown time  . metFORMIN (GLUCOPHAGE-XR) 500 MG 24 hr tablet Take 2 tablets by mouth 2 (two) times daily.  7 08/21/2015 at Unknown time  . Multiple Vitamins-Minerals (MULTIVITAMIN GUMMIES WOMENS PO) Take by mouth daily.   08/21/2015 at Unknown time  . Pirfenidone (ESBRIET) 267 MG CAPS 3 caps TID (Patient taking differently: Take 3 capsules by mouth 3 (three) times daily. 3 caps TID) 270 capsule 5 08/21/2015 at Unknown time  . potassium chloride SA (K-DUR,KLOR-CON) 20 MEQ tablet Take 20 mEq by mouth daily.   08/21/2015 at Unknown time  . predniSONE (DELTASONE) 20 MG tablet Take 2 tablets (40 mg total) by mouth daily with breakfast. 60 tablet 3 08/21/2015 at Unknown time  . topiramate (TOPAMAX) 25 MG tablet Take 25 mg by mouth 2 (two) times daily.   08/21/2015 at Unknown time  . traZODone (DESYREL) 150 MG tablet Take 150 mg by mouth at bedtime.    08/20/2015 at Unknown time  . vitamin E 400 UNIT capsule Take 400 Units by mouth 2 (two) times daily.    08/21/2015 at Unknown time  . albuterol (PROVENTIL) (2.5 MG/3ML) 0.083% nebulizer solution Take 3 mLs (2.5 mg total) by nebulization every 2 (two) hours as needed for wheezing. 75 mL 12 unknown  . ipratropium-albuterol (DUONEB) 0.5-2.5 (3) MG/3ML SOLN Take 3 mLs by nebulization every 6 (six) hours. 360 mL  unknown  . meclizine (ANTIVERT) 25 MG tablet Take 25 mg by mouth 3 (three) times daily as needed for dizziness.   unknown  . ondansetron (ZOFRAN) 4 MG tablet Take 4 mg by mouth every 8 (eight) hours as needed for nausea or vomiting.   unknown  . oxyCODONE (OXY IR/ROXICODONE) 5 MG immediate release tablet Take 1-2 tablets (5-10 mg total) by mouth every 4 (four) hours as needed for severe pain. 30 tablet 0 unknown   Assessment: 54 yo female  with IPF admitted with increasing O2 demand and SOB. Vanc/Zosyn per pharmacy. WBC 13, SCr stable, afebrile, eCrCl 60-65 ml/min.   Goal of Therapy:  Vancomycin trough level 15-20 mcg/ml  Plan:  -Vancomycin 1500 mg IV x1 then 1g/12h -Zosyn 3.375 g IV q8h -Monitor cx, renal fx, VT as needed  Hughes Better, PharmD, BCPS Clinical Pharmacist Pager: [phone] 08/22/2015 12:43 PM

## 2015-08-22 NOTE — Progress Notes (Signed)
Pt was placed on CPAP at 0230.  RN called RT at 0430 for pts sats holding in the low 70's on CPAP.  CCM was paged and BIPAP was placed, pt is now at 95% on the bipap. RN will continue to monitor.

## 2015-08-22 NOTE — Progress Notes (Signed)
Pt arrived from the ED to 2C11 at [place].  Labored breathing, and SOB with talking, oxygen fluctuating between 78 and 85 on 8 L with high flow meter.  Oxygen increased to 10 L and saturation up to 88 to 92.  Other VS's stable, no complaints of pain, pt had a bottle of pirfenidone with her and it was counted and checked into pharmacy. RN will continue to monitor.

## 2015-08-22 NOTE — Progress Notes (Signed)
Placed patient on Bipap due increased WOB, and to give pt a break. Patient is tolerating it well on settings (16/10 Rate 10 FiO2 80%) and sats are stable at 85%%. RT will continue to monitor.

## 2015-08-22 NOTE — ED Notes (Signed)
Report given to the rn on 2 c

## 2015-08-22 NOTE — Progress Notes (Signed)
[facility] PROGRESS NOTE  [name] TKZ:[id number] DOB: 1961-01-17 DOA: 08/21/2015 PCP: Joann Hatchet, MD  Length of stay: 1   Assessment/Plan: Principal Problem:   Acute on chronic respiratory failure ([place]) Active Problems:   DM2 (diabetes mellitus, type 2) ([place])   HTN (hypertension)   Diabetes mellitus type 2, controlled ([place])   Chronic anemia   Hypothyroidism   IPF (idiopathic pulmonary fibrosis) ([facility])   OSA (obstructive sleep apnea)   Acute and chronic respiratory failure (acute-on-chronic) ([place])    1. Acute on chronic respiratory failure:  The patient has low-grade fever and worsening hypoxia consistent with IPF flare. Vs PNA vs possible PE, D-DIMER elevated , will order CT PE protocol Unfortunately, the patient was set to see the [facility] for discussion of lung transplant within the next 2 weeks. -Levofloxacin DC , switch to vanc and zosyn,for broader coverage for HCAP -High-dose methylprednisolone for 2 days. -Consult to pulmonology  cont  stepdown unit -Continue home Esbriet    2. NIDDM:  Stable. Continue home glargine -Continue home metformin -Sliding scale corrections  3. HTN:  Stable. DC  Lisinopril as pt will receive IV contrast with CT   4. Hypothyroidism:  Stable. -Continue home levothyroxine -TSH is ordered  5. Chronic normocytic anemia:  Stable.   6. Depression and anxiety:  Stable.  -Continue home duloxetine and alprazolam and topiramate   DVT prophylaxsis   Code Status:      Code Status Orders        Start     Ordered   08/22/15 0121  Do not attempt resuscitation (DNR)   Continuous    Question Answer Comment  In the event of cardiac or respiratory ARREST Do not call a "code blue"   In the event of cardiac or respiratory ARREST Do not perform Intubation, CPR, defibrillation or ACLS   In the event of cardiac or respiratory ARREST Use medication by any route, position, wound care, and other measures to  relive pain and suffering. May use oxygen, suction and manual treatment of airway obstruction as needed for comfort.      08/22/15 0121     Family Communication: family updated about patient's clinical progress Disposition Plan:  Cont stepdown , pulmonary consult    Brief narrative: Joann [name] is a 54 y.o. female with a past medical history significant for IPF on 8 L home oxygen, referred to lung transplant center at [facility], IDDM, HTN, anemia, hypothyroidism, and OSA who presents with low-grade fever and increasing oxygen demands.  The patient was in her usual state of health until the last few days when she has felt increasing shortness of breath and requiring additional oxygen at night. She is also noted low-grade fevers and an increase in sputum during this time. Today she needed to turn her oxygen to 10 L and saw that she was desaturating with any exertion.  In the ED, the patient was hypoxic with exertion. She had an elevated WBC. She had a low-grade fever. Her BNP was normal. Her chest x-ray was unchanged. She was admitted to [facility] for IPF flare versus HCAP.  Consultants:  Pulmonary  Procedures:  None  Antibiotics: Anti-infectives    Start     Dose/Rate Route Frequency Ordered Stop   08/22/15 2300  levofloxacin (LEVAQUIN) IVPB 750 mg  Status:  Discontinued     750 mg 100 mL/hr over 90 Minutes Intravenous Every 24 hours 08/22/15 0121 08/22/15 0826   08/22/15 1030  vancomycin (VANCOCIN) 1,500 mg in sodium chloride 0.9 % 500 mL IVPB     1,500 mg 250 mL/hr over 120 Minutes Intravenous  Once 08/22/15 1011 08/22/15 1230   08/22/15 1000  piperacillin-tazobactam (ZOSYN) IVPB 3.375 g     3.375 g 12.5 mL/hr over 240 Minutes Intravenous 3 times per day 08/22/15 0839     08/21/15 2300  levofloxacin (LEVAQUIN) IVPB 750 mg     750 mg 100 mL/hr over 90 Minutes Intravenous  Once 08/21/15 2252 08/22/15 0045         HPI/Subjective: Patient severely hypoxic on BiPAP, able to speak in  full sentences Objective: Filed Vitals:   08/22/15 0600 08/22/15 0833 08/22/15 1208 08/22/15 1232  BP: 142/88  128/87 128/87  Pulse: 114  96 98  Temp:  98.9 F (37.2 C) 98.2 F (36.8 C)   TempSrc:  Axillary Axillary   Resp:   28 30  Height:      Weight:      SpO2: 93%  95% 97%    Intake/Output Summary (Last 24 hours) at 08/22/15 1340 Last data filed at 08/22/15 0600  Gross per 24 hour  Intake      0 ml  Output    350 ml  Net   -350 ml    Exam:  General: No acute respiratory distress Lungs: Clear to auscultation bilaterally without wheezes or crackles Cardiovascular: Regular rate and rhythm without murmur gallop or rub normal S1 and S2 Abdomen: Nontender, nondistended, soft, bowel sounds positive, no rebound, no ascites, no appreciable mass Extremities: No significant cyanosis, clubbing, or edema bilateral lower extremities     Data Review   Micro Results Recent Results (from the past 240 hour(s))  MRSA PCR Screening     Status: None   Collection Time: 08/22/15  1:23 AM  Result Value Ref Range Status   MRSA by PCR NEGATIVE NEGATIVE Final    Comment:        The GeneXpert MRSA Assay (FDA approved for NASAL specimens only), is one component of a comprehensive MRSA colonization surveillance program. It is not intended to diagnose MRSA infection nor to guide or monitor treatment for MRSA infections.     Radiology Reports Dg Chest 2 View  08/21/2015  CLINICAL DATA:  Pulmonary fibrosis.  Increasing shortness of breath EXAM: CHEST  2 VIEW COMPARISON:  August 05, 2015 FINDINGS: Widespread pulmonary fibrosis is present bilaterally, stable. There is no new opacities to suggest superimposed edema or consolidation. Heart is upper normal in size with pulmonary vascularity within normal limits. No adenopathy. There is postoperative change in the lower cervical spine. IMPRESSION: Widespread interstitial fibrosis, stable. No new opacity apparent. No change in cardiac  silhouette. It must be cautioned that subtle superimposed pneumonia could easily be obscured by this degree of extensive fibrosis. Electronically Signed   By: Lowella Grip III M.D.   On: 08/21/2015 21:16   Dg Chest 2 View  07/26/2015  CLINICAL DATA:  Chronic pulmonary fibrosis. Chronic shortness of breath. EXAM: CHEST  2 VIEW COMPARISON:  05/18/2015 FINDINGS: The heart is enlarged but stable. Prominent mediastinal and hilar contours are unchanged. Chronic pulmonary changes of pulmonary fibrosis. No definite acute overlying pulmonary process. No pleural effusion. Bilateral cervical ribs are noted. IMPRESSION: Chronic changes of pulmonary fibrosis. No acute overlying pulmonary findings. Electronically Signed   By: Marijo Sanes M.D.   On: 07/26/2015 15:18     CBC  Recent Labs Lab 08/21/15 2018 08/22/15 0354  WBC 14.5* 13.0*  HGB 10.8* 10.0*  HCT 36.4 33.6*  PLT 364 309  MCV 84.3 84.2  MCH 25.0* 25.1*  MCHC 29.7* 29.8*  RDW 18.0* 18.1*    Chemistries   Recent Labs Lab 08/21/15 2018 08/22/15 0354  NA 137 139  K 3.5 3.3*  CL 100* 102  CO2 27 29  GLUCOSE 223* 128*  BUN 9 9  CREATININE 0.98 0.88  CALCIUM 8.9 8.4*  AST 19  --   ALT 28  --   ALKPHOS 82  --   BILITOT 0.4  --    ------------------------------------------------------------------------------------------------------------------ estimated creatinine clearance is 69.8 mL/min (by C-G formula based on Cr of 0.88). ------------------------------------------------------------------------------------------------------------------ No results for input(s): HGBA1C in the last 72 hours. ------------------------------------------------------------------------------------------------------------------ No results for input(s): CHOL, HDL, LDLCALC, TRIG, CHOLHDL, LDLDIRECT in the last 72 hours. ------------------------------------------------------------------------------------------------------------------  Recent Labs   08/22/15 0354  TSH 0.691   ------------------------------------------------------------------------------------------------------------------ No results for input(s): VITAMINB12, FOLATE, FERRITIN, TIBC, IRON, RETICCTPCT in the last 72 hours.  Coagulation profile No results for input(s): INR, PROTIME in the last 168 hours.  No results for input(s): DDIMER in the last 72 hours.  Cardiac Enzymes No results for input(s): CKMB, TROPONINI, MYOGLOBIN in the last 168 hours.  Invalid input(s): CK ------------------------------------------------------------------------------------------------------------------ Invalid input(s): POCBNP   CBG:  Recent Labs Lab 08/22/15 0218 08/22/15 0823 08/22/15 1206  GLUCAP 136* 243* 265*       Studies: Dg Chest 2 View  08/21/2015  CLINICAL DATA:  Pulmonary fibrosis.  Increasing shortness of breath EXAM: CHEST  2 VIEW COMPARISON:  August 05, 2015 FINDINGS: Widespread pulmonary fibrosis is present bilaterally, stable. There is no new opacities to suggest superimposed edema or consolidation. Heart is upper normal in size with pulmonary vascularity within normal limits. No adenopathy. There is postoperative change in the lower cervical spine. IMPRESSION: Widespread interstitial fibrosis, stable. No new opacity apparent. No change in cardiac silhouette. It must be cautioned that subtle superimposed pneumonia could easily be obscured by this degree of extensive fibrosis. Electronically Signed   By: Lowella Grip III M.D.   On: 08/21/2015 21:16      Lab Results  Component Value Date   HGBA1C 8.6* 02/27/2015   Lab Results  Component Value Date   CREATININE 0.88 08/22/2015       Scheduled Meds: . dicyclomine  20 mg Oral 4 times per day  . DULoxetine  60 mg Oral q morning - 10a  . enoxaparin (LOVENOX) injection  40 mg Subcutaneous Q24H  . insulin aspart  0-15 Units Subcutaneous TID WC  . insulin aspart  0-5 Units Subcutaneous QHS  .  insulin glargine  45 Units Subcutaneous QHS  . levothyroxine  200 mcg Oral QAC breakfast  . methylPREDNISolone (SOLU-MEDROL) injection  125 mg Intravenous Q12H  . pantoprazole  40 mg Oral Daily  . piperacillin-tazobactam (ZOSYN)  IV  3.375 g Intravenous 3 times per day  . Pirfenidone  3 capsule Oral TID  . potassium chloride SA  20 mEq Oral Daily  . sodium chloride  3 mL Intravenous Q12H  . topiramate  25 mg Oral BID  . traZODone  150 mg Oral QHS   Continuous Infusions:   Principal Problem:   Acute on chronic respiratory failure (HCC) Active Problems:   DM2 (diabetes mellitus, type 2) (HCC)   HTN (hypertension)   Diabetes mellitus type 2, controlled (HCC)   Chronic anemia   Hypothyroidism   IPF (idiopathic pulmonary fibrosis) (H[facility])   OSA (obstructive sleep apnea)   Acute  and chronic respiratory failure (acute-on-chronic) ([place])    Time spent: 45 minutes   [facility] Pager [phone]. If 7PM-7AM, please contact night-coverage at [email or web address], password [facility] 08/22/2015, 1:40 PM  LOS: 1 day

## 2015-08-22 NOTE — Progress Notes (Signed)
Called to bedside following patient removing Bipap due to being uncomfortable. RN placed patient on 12L nasal cannula with sats reading 82%. Per patient, her home goal by her doctor is 75%<  Patient is speaking in complete sentences and says that she feels fine, no respiratory distress is noted other than decreased sats. RT will await further instructions and continue to monitor.

## 2015-08-22 NOTE — Progress Notes (Signed)
Utilization Review Completed.Joann [name] T10/13/2016  

## 2015-08-22 NOTE — Progress Notes (Signed)
Placed patient on Bipap due to sats remaining low after increasing CPAP pressure and O2 flow. Patient is tolerating it well on settings (16/10 Rate 10 FiO2 80%) and sats have increased to 95%. RT will continue to monitor.

## 2015-08-22 NOTE — Consult Note (Signed)
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 Presented 10/12 with increased SOB, fever, increased cough with purulent sputum.  Admitted by [facility] with HCAP v IPF flare and started on IV abx, IV steroids.  She had minimal improvement and on 10/13 had increased dyspnea/WOB and hypoxia despite bipap and PCCM consulted.     STUDIES:    SIGNIFICANT EVENTS:    HISTORY OF PRESENT ILLNESS:   54yo female with hx severe IPF on 8L home O2, OSA, DM and HTN awaiting lung transplant w/u at [facility] clinic (scheduled to go there 10/16). Presented 10/12 with increased SOB, fever, increased cough with purulent sputum.  Admitted by [facility] with HCAP v IPF flare and started on IV abx, IV steroids.  She had minimal improvement and on 10/13 had increased dyspnea/WOB and hypoxia despite bipap and PCCM consulted.     States fever started Tuesday, increased cough and SOB above baseline.  Wears 8L O2 at home.  Was to leave 10/16 for [facility] clinic for lung transplant w/u. Denies orthopnea, BLE edema, chest pain, syncope, hemoptysis, recent sick contacts.   PAST MEDICAL HISTORY :   has a past medical history of Asthma; Diabetes mellitus; Headache(784.0); Fibromyalgia; GERD (gastroesophageal reflux disease); IBS (irritable bowel syndrome); Renal mass, right; IC (interstitial cystitis); Frequency of urination; Anemia; Swelling of both lower extremities; Hypothyroidism; Depression; Cancer ([place]); Hypertension; Wears glasses; Sleep apnea; Pulmonary fibrosis ([place]); Pneumonia; Fatty liver; and Oxygen dependent.  has past surgical history that includes Cesarean section  (1983, 1985); [place]; CYSTOCOPY; Cystoscopy; Robotic assited partial nephrectomy (Right, 12/14/2013); Cystoscopy w/ ureteral stent placement (Right, 12/14/2013); Cervical laminectomy (07/2012); Abdominal hysterectomy (1996); Cholecystectomy (1995); Posterior cervical laminectomy (2004); Tubal ligation; Colonoscopy; Incisional hernia repair (N/A, 09/12/2014); Insertion of mesh (N/A, 09/12/2014); Video assisted thoracoscopy (Right, 03/21/2015); and Lung biopsy (Right, 03/21/2015). Prior to Admission medications   Medication Sig Start Date End Date Taking? Authorizing Provider  ALPRAZolam Duanne Moron) 0.5 MG tablet Take 0.5 mg by mouth daily as needed for anxiety.    Yes Historical Provider, MD  cholecalciferol (VITAMIN D) 1000 UNITS tablet Take 1,000 Units by mouth every morning.    Yes Historical Provider, MD  dicyclomine (BENTYL) 20 MG tablet Take 20 mg by mouth every 6 (six) hours.   Yes Historical Provider, MD  DULoxetine (CYMBALTA) 60 MG capsule Take 60 mg by mouth every morning.    Yes Historical Provider, MD  esomeprazole (NEXIUM) 40 MG capsule Take 40 mg by mouth 2 (two) times daily before a meal.    Yes Historical Provider, MD  estrogens, conjugated, (PREMARIN) 0.625 MG tablet Take 0.625 mg by mouth every morning.    Yes Historical Provider, MD  guaiFENesin (MUCINEX) 600 MG 12 hr tablet Take 1 tablet (600 mg total) by mouth 2 (two) times daily. Patient taking differently: Take 600 mg by mouth 2 (two) times daily as needed for cough or to loosen phlegm.  03/25/15  Yes Erin R Barrett, PA-C  insulin aspart (NOVOLOG) 100 UNIT/ML injection Inject 0-9 Units into the skin 3 (three) times daily with meals. Patient taking differently: Inject 15 Units into the skin 3 (  three) times daily with meals. Can adjust dose up or down according to meal size. 05/20/15  Yes Corey Harold, NP  insulin glargine (LANTUS) 100 UNIT/ML injection Inject 0.45 mLs (45 Units total) into the skin at bedtime. 05/20/15  Yes Corey Harold,  NP  levothyroxine (SYNTHROID, LEVOTHROID) 200 MCG tablet Take 200 mcg by mouth daily before breakfast.   Yes Historical Provider, MD  lisinopril (PRINIVIL,ZESTRIL) 20 MG tablet Take 20 mg by mouth daily.   Yes Historical Provider, MD  metFORMIN (GLUCOPHAGE-XR) 500 MG 24 hr tablet Take 2 tablets by mouth 2 (two) times daily. 07/22/15  Yes Historical Provider, MD  Multiple Vitamins-Minerals (MULTIVITAMIN GUMMIES WOMENS PO) Take by mouth daily.   Yes Historical Provider, MD  Pirfenidone (ESBRIET) 267 MG CAPS 3 caps TID Patient taking differently: Take 3 capsules by mouth 3 (three) times daily. 3 caps TID 05/29/15  Yes Brand Males, MD  potassium chloride SA (K-DUR,KLOR-CON) 20 MEQ tablet Take 20 mEq by mouth daily.   Yes Historical Provider, MD  predniSONE (DELTASONE) 20 MG tablet Take 2 tablets (40 mg total) by mouth daily with breakfast. 07/18/15  Yes Brand Males, MD  topiramate (TOPAMAX) 25 MG tablet Take 25 mg by mouth 2 (two) times daily.   Yes Historical Provider, MD  traZODone (DESYREL) 150 MG tablet Take 150 mg by mouth at bedtime.    Yes Historical Provider, MD  vitamin E 400 UNIT capsule Take 400 Units by mouth 2 (two) times daily.    Yes Historical Provider, MD  albuterol (PROVENTIL) (2.5 MG/3ML) 0.083% nebulizer solution Take 3 mLs (2.5 mg total) by nebulization every 2 (two) hours as needed for wheezing. 05/20/15   Corey Harold, NP  ipratropium-albuterol (DUONEB) 0.5-2.5 (3) MG/3ML SOLN Take 3 mLs by nebulization every 6 (six) hours. 05/20/15   Corey Harold, NP  meclizine (ANTIVERT) 25 MG tablet Take 25 mg by mouth 3 (three) times daily as needed for dizziness.    Historical Provider, MD  ondansetron (ZOFRAN) 4 MG tablet Take 4 mg by mouth every 8 (eight) hours as needed for nausea or vomiting.    Historical Provider, MD  oxyCODONE (OXY IR/ROXICODONE) 5 MG immediate release tablet Take 1-2 tablets (5-10 mg total) by mouth every 4 (four) hours as needed for severe pain. 04/19/15    Gaye Pollack, MD   Allergies  Allergen Reactions  . Other Anaphylaxis    WALNUTS  . Peanut-Containing Drug Products Shortness Of Breath    Pecans, walnuts as well  . Peanuts [Peanut Oil] Shortness Of Breath  . Pecan Pollen Shortness Of Breath  . Sulfa Antibiotics Hives and Shortness Of Breath  . Betadine [Povidone Iodine] Rash  . Latex Rash    FAMILY HISTORY:  indicated that her mother is deceased. She indicated that her father is deceased.  SOCIAL HISTORY:  reports that she has never smoked. She has never used smokeless tobacco. She reports that she does not drink alcohol or use illicit drugs.  REVIEW OF SYSTEMS:  As per HPI - All other systems reviewed and were neg.   SUBJECTIVE:   VITAL SIGNS: Temp:  [98.2 F (36.8 C)-99.7 F (37.6 C)] 98.9 F (37.2 C) (10/13 0833) Pulse Rate:  [95-121] 114 (10/13 0600) Resp:  [18-34] 32 (10/13 0452) BP: (110-143)/(71-89) 142/88 mmHg (10/13 0600) SpO2:  [85 %-97 %] 93 % (10/13 0600) FiO2 (%):  [80 %] 80 % (10/13 0452) Weight:  [182 lb (82.555 kg)-186 lb 11.7 oz (84.7 kg)] 186  lb 11.7 oz (84.7 kg) (10/13 0200)   INTAKE / OUTPUT:  Intake/Output Summary (Last 24 hours) at 08/22/15 1035 Last data filed at 08/22/15 0600  Gross per 24 hour  Intake      0 ml  Output    350 ml  Net   -350 ml    PHYSICAL EXAMINATION: General:   Chronically ill appearing female, NAD  Neuro:  Awake, alert, appropriate, MAE  HEENT:  Mm dry, NRB, cushinoid face Cardiovascular:  s1s2 rrr Lungs:  resp even, mildly labored on NRB, bilateral dry crackles, diminished bases  Abdomen:  Round, soft, non tender, +bs  Musculoskeletal:  Warm and dry, no edema   LABS:  CBC  Recent Labs Lab 08/21/15 2018 08/22/15 0354  WBC 14.5* 13.0*  HGB 10.8* 10.0*  HCT 36.4 33.6*  PLT 364 309   Coag's No results for input(s): APTT, INR in the last 168 hours. BMET  Recent Labs Lab 08/21/15 2018 08/22/15 0354  NA 137 139  K 3.5 3.3*  CL 100* 102  CO2 27 29   BUN 9 9  CREATININE 0.98 0.88  GLUCOSE 223* 128*   Electrolytes  Recent Labs Lab 08/21/15 2018 08/22/15 0354  CALCIUM 8.9 8.4*   Sepsis Markers No results for input(s): LATICACIDVEN, PROCALCITON, O2SATVEN in the last 168 hours. ABG No results for input(s): PHART, PCO2ART, PO2ART in the last 168 hours. Liver Enzymes  Recent Labs Lab 08/21/15 2018  AST 19  ALT 28  ALKPHOS 82  BILITOT 0.4  ALBUMIN 2.9*   Cardiac Enzymes No results for input(s): TROPONINI, PROBNP in the last 168 hours. Glucose  Recent Labs Lab 08/22/15 0218  GLUCAP 136*    Imaging Dg Chest 2 View  08/21/2015  CLINICAL DATA:  Pulmonary fibrosis.  Increasing shortness of breath EXAM: CHEST  2 VIEW COMPARISON:  August 05, 2015 FINDINGS: Widespread pulmonary fibrosis is present bilaterally, stable. There is no new opacities to suggest superimposed edema or consolidation. Heart is upper normal in size with pulmonary vascularity within normal limits. No adenopathy. There is postoperative change in the lower cervical spine. IMPRESSION: Widespread interstitial fibrosis, stable. No new opacity apparent. No change in cardiac silhouette. It must be cautioned that subtle superimposed pneumonia could easily be obscured by this degree of extensive fibrosis. Electronically Signed   By: Lowella Grip III M.D.   On: 08/21/2015 21:16     ASSESSMENT / PLAN:   Acute on chronic hypoxic respiratory failure - in setting severe IPF with flare.  Does not appear volume overloaded.   IPF flare OSA  PLAN -  bipap PRN and qhs  Titrate O2 as able- accept sats >85 Low dose PRN morphine for dyspnea - she states this also helps her tolerate bipap more easily  Continue high dose IV steroids - 1gm/day in divided doses ordered per [facility] x 48 hours.  Continue pirfenidone  Ok to keep in SDU for now  See discussion below    DM HTN Hypothyroid  Anemia  Anxiety/depression  PLAN -  Per [facility]    Discussed at length  with pt at bedside.  She in anxious to "get better" and get out of the hospital to make her trip to [facility] clinic for possible lung transplant.  However she does feel that she has been declining quickly overall with more frequent and more severe flares, increased O2 demands at home, more baseline dyspnea.  She wishes to continue aggressive care with the goal of d/c and ultimately lung  transplant but would NOT want intubation if her condition worsened.  She would instead want to be made comfortable.  Discussed plan of care and will alert Dr. Chase Caller to her admission.     Nickolas Madrid, NP 08/22/2015  10:35 AM Pager: [phone] or [phone]

## 2015-08-22 NOTE — Progress Notes (Signed)
Patient called RN to room, patient very anxious and O2 sat staying around 65%. Patient requested to be taken off high flow [place] and put on NRB mask. Patient placed on NRB mask sats slowly started rising up to 85%. MD Abrol notified new orders placed will continue to monitor patient. Currently patient resting comfortably with eyes closed O2 sat 88%. Urban Gibson Lexine Baton

## 2015-08-22 NOTE — H&P (Signed)
History and Physical  Joann [name]  VQM:[id number]  DOB: 27-Jul-1961  DOA: 08/21/2015  Referring physician: Ivin Booty, MD PCP: Velna Hatchet, MD   Chief Complaint: Dyspnea  HPI: Joann [name] is a 54 y.o. female with a past medical history significant for IPF on 8 L home oxygen, referred to lung transplant center at [facility], IDDM, HTN, anemia, hypothyroidism, and OSA who presents with low-grade fever and increasing oxygen demands.  The patient was in her usual state of health until the last few days when she has felt increasing shortness of breath and requiring additional oxygen at night. She is also noted low-grade fevers and an increase in sputum during this time. Today she needed to turn her oxygen to 10 L and saw that she was desaturating with any exertion.  In the ED, the patient was hypoxic with exertion. She had an elevated WBC. She had a low-grade fever. Her BNP was normal. Her chest x-ray was unchanged. She was admitted to [facility] for IPF flare versus HCAP.   Review of Systems:  Patient seen 11:15 PM on 08/21/2015. Pt complains of dyspnea, increased sputum, low-grade fevers, malaise, anxiety. All other systems negative except as just noted or noted in the history of present illness.  Past Medical History  Diagnosis Date  . Asthma   . Diabetes mellitus   . Headache(784.0)     MIGRAINES  . Fibromyalgia   . GERD (gastroesophageal reflux disease)   . IBS (irritable bowel syndrome)   . Renal mass, right   . IC (interstitial cystitis)   . Frequency of urination   . Anemia   . Swelling of both lower extremities     TAKES MAXIDE FOR SWELLING (DENIES HBP)  . Hypothyroidism   . Depression   . Cancer ([place])     kidney  . Hypertension   . Wears glasses   . Sleep apnea     uses a cpap  . Pulmonary fibrosis ([place])   . Pneumonia   . Fatty liver   . Oxygen dependent     2 Liters at rest, 3 liters with activity  The above past medical history was reviewed.  Past Surgical  History  Procedure Laterality Date  . Cesarean section  1983, 1985     x 2  . Bladder tack    . Cystocopy    . Cystoscopy    . Robotic assited partial nephrectomy Right 12/14/2013    Procedure: ROBOTIC ASSITED PARTIAL NEPHRECTOMY;  Surgeon: Dutch Gray, MD;  Location: [facility];  Service: Urology;  Laterality: Right;  . Cystoscopy w/ ureteral stent placement Right 12/14/2013    Procedure: CYSTOSCOPY WITH RETROGRADE PYELOGRAM/URETERAL STENT PLACEMENT;  Surgeon: Dutch Gray, MD;  Location: [facility];  Service: Urology;  Laterality: Right;  . Cervical laminectomy  07/2012    anterior with plates and screws.  . Abdominal hysterectomy  1996  . Cholecystectomy  1995    tubal ligation also  . Posterior cervical laminectomy  2004  . Tubal ligation    . Colonoscopy    . Incisional hernia repair N/A 09/12/2014    Procedure: HERNIA REPAIR INCISIONAL;  Surgeon: Coralie Keens, MD;  Location: [place];  Service: General;  Laterality: N/A;  . Insertion of mesh N/A 09/12/2014    Procedure: INSERTION OF MESH;  Surgeon: Coralie Keens, MD;  Location: [place];  Service: General;  Laterality: N/A;  . Video assisted thoracoscopy Right 03/21/2015    Procedure: VIDEO ASSISTED THORACOSCOPY;  Surgeon: Gaye Pollack, MD;  Location: [facility] OR;  Service: Thoracic;  Laterality: Right;  . Lung biopsy Right 03/21/2015    Procedure: Right Upper and Lower Lobe Lung Wedge BIOPSY;  Surgeon: Gaye Pollack, MD;  Location: [facility] OR;  Service: Thoracic;  Laterality: Right;  The above surgical history was reviewed.  Social History: Patient lives with her husband. She is a nonsmoker. She worked formerly as a Marine scientist.    Allergies  Allergen Reactions  . Other Anaphylaxis    WALNUTS  . Peanut-Containing Drug Products Shortness Of Breath    Pecans, walnuts as well  . Peanuts [Peanut Oil] Shortness Of Breath  . Pecan Pollen Shortness Of Breath  . Sulfa Antibiotics Hives and Shortness Of Breath  . Betadine  [Povidone Iodine] Rash  . Latex Rash    Family History  Problem Relation Age of Onset  . Cancer Mother     breast, lung  . COPD Mother   . Hyperlipidemia Mother   . Other Mother     BOOP  . COPD Father   . Hypertension Father   . Hyperlipidemia Father   . Stroke Father     Prior to Admission medications   Medication Sig Start Date End Date Taking? Authorizing Provider  ALPRAZolam Duanne Moron) 0.5 MG tablet Take 0.5 mg by mouth daily as needed for anxiety.    Yes Historical Provider, MD  cholecalciferol (VITAMIN D) 1000 UNITS tablet Take 1,000 Units by mouth every morning.    Yes Historical Provider, MD  dicyclomine (BENTYL) 20 MG tablet Take 20 mg by mouth every 6 (six) hours.   Yes Historical Provider, MD  DULoxetine (CYMBALTA) 60 MG capsule Take 60 mg by mouth every morning.    Yes Historical Provider, MD  esomeprazole (NEXIUM) 40 MG capsule Take 40 mg by mouth 2 (two) times daily before a meal.    Yes Historical Provider, MD  estrogens, conjugated, (PREMARIN) 0.625 MG tablet Take 0.625 mg by mouth every morning.    Yes Historical Provider, MD  guaiFENesin (MUCINEX) 600 MG 12 hr tablet Take 1 tablet (600 mg total) by mouth 2 (two) times daily. Patient taking differently: Take 600 mg by mouth 2 (two) times daily as needed for cough or to loosen phlegm.  03/25/15  Yes Erin R Barrett, PA-C  insulin aspart (NOVOLOG) 100 UNIT/ML injection Inject 0-9 Units into the skin 3 (three) times daily with meals. Patient taking differently: Inject 15 Units into the skin 3 (three) times daily with meals. Can adjust dose up or down according to meal size. 05/20/15  Yes Corey Harold, NP  insulin glargine (LANTUS) 100 UNIT/ML injection Inject 0.45 mLs (45 Units total) into the skin at bedtime. 05/20/15  Yes Corey Harold, NP  levothyroxine (SYNTHROID, LEVOTHROID) 200 MCG tablet Take 200 mcg by mouth daily before breakfast.   Yes Historical Provider, MD  lisinopril (PRINIVIL,ZESTRIL) 20 MG tablet Take 20 mg  by mouth daily.   Yes Historical Provider, MD  metFORMIN (GLUCOPHAGE-XR) 500 MG 24 hr tablet Take 2 tablets by mouth 2 (two) times daily. 07/22/15  Yes Historical Provider, MD  Multiple Vitamins-Minerals (MULTIVITAMIN GUMMIES WOMENS PO) Take by mouth daily.   Yes Historical Provider, MD  Pirfenidone (ESBRIET) 267 MG CAPS 3 caps TID Patient taking differently: Take 3 capsules by mouth 3 (three) times daily. 3 caps TID 05/29/15  Yes Brand Males, MD  potassium chloride SA (K-DUR,KLOR-CON) 20 MEQ tablet Take 20 mEq by mouth daily.   Yes Historical  Provider, MD  predniSONE (DELTASONE) 20 MG tablet Take 2 tablets (40 mg total) by mouth daily with breakfast. 07/18/15  Yes Brand Males, MD  topiramate (TOPAMAX) 25 MG tablet Take 25 mg by mouth 2 (two) times daily.   Yes Historical Provider, MD  traZODone (DESYREL) 150 MG tablet Take 150 mg by mouth at bedtime.    Yes Historical Provider, MD  vitamin E 400 UNIT capsule Take 400 Units by mouth 2 (two) times daily.    Yes Historical Provider, MD  albuterol (PROVENTIL) (2.5 MG/3ML) 0.083% nebulizer solution Take 3 mLs (2.5 mg total) by nebulization every 2 (two) hours as needed for wheezing. 05/20/15   Corey Harold, NP  ipratropium-albuterol (DUONEB) 0.5-2.5 (3) MG/3ML SOLN Take 3 mLs by nebulization every 6 (six) hours. 05/20/15   Corey Harold, NP  meclizine (ANTIVERT) 25 MG tablet Take 25 mg by mouth 3 (three) times daily as needed for dizziness.    Historical Provider, MD  ondansetron (ZOFRAN) 4 MG tablet Take 4 mg by mouth every 8 (eight) hours as needed for nausea or vomiting.    Historical Provider, MD  oxyCODONE (OXY IR/ROXICODONE) 5 MG immediate release tablet Take 1-2 tablets (5-10 mg total) by mouth every 4 (four) hours as needed for severe pain. 04/19/15   Gaye Pollack, MD    Physical Exam: BP 138/85 mmHg  Pulse 102  Temp(Src) 98.2 F (36.8 C) (Oral)  Resp 28  Ht 5' (1.524 m)  Wt 84.7 kg (186 lb 11.7 oz)  BMI 36.47 kg/m2  SpO2  93% General appearance:  Overweight adult female, alert and in moderate distress from dyspnea.  Responds appropriately to questions.  Anxious and tearful. Eyes: Sclerae normal without icterus, conjunctiva pink, lids and lashes normal.  PERRL and EOMI.   Nose: No deformity, discharge, or epistaxis.  Nasal cannula in place. Mouth: OP moist without erythema, exudates, cobblestoning, or ulcers.  No airway deformities.   Skin: Warm and dry.  No jaundice.  No suspicious rashes or lesions. Cardiac: Tachycardic, regular, nl S1-S2, no murmurs appreciated.  Respiratory: Normal respiratory rate and rhythm.  Fine rales at both bases. Abdomen: BS present.  Abdomen soft without rigidity.  No TTP or rebound all quadrants.  Neuro: Sensorium intact. Speech is fluent. Moves all extremities equally.    Psych: Tearful affect.  Speech normal. Thought content/process linear/appropriate.      Labs on Admission:  The metabolic panel is notable for normal sodium, potassium, bicarbonate, and serum creatinine. Slight hyperglycemia. Transaminases and bilirubin are normal. The abdomen is 2.9 g/dL. There is an elevation in WBC to 14.5K/uL. There is a stable chronic normocytic anemia. The BNP is normal The troponin is negative.   Radiological Exams on Admission: Personally reviewed: Dg Chest 2 View 08/21/2015   Diffuse pulmonary fibrosis, no obvious new opacity.   EKG: Independently reviewed. It is tachycardia with right atrial enlargement, P pulmonale.    Assessment/Plan  1. Acute on chronic respiratory failure:  The patient has low-grade fever and worsening hypoxia consistent with IPF flare. Cannot rule out superimposed pneumonia instead.  Unfortunately, the patient was set to see the [facility] for discussion of lung transplant within the next 2 weeks. -Levofloxacin for coverage to include Pseudomonas, atypicals, and gram-positive. -High-dose methylprednisolone for 2 days. -Consult to  pulmonology -Admit to stepdown unit -The patient's previous admission was similar to this she received high-dose steroids required high flow nasal cannula and BiPAP for a period of time and got better. -Continue  home Esbriet -Hold home prednisone 40 mg  2. NIDDM:  Stable. Continue home glargine -Continue home metformin -Sliding scale corrections  3. HTN:  Stable. -Continue home lisinopril  4. Hypothyroidism:  Stable. -Continue home levothyroxine -TSH is ordered  5. Chronic normocytic anemia:  Stable.   6. Depression and anxiety:  Stable.  -Continue home duloxetine and alprazolam and topiramate     DVT PPx: Lovenox Diet: Carb modified Consultants: Pulmonology, Respiratory therapy Code Status: DNR Family Communication: The patient's differential diagnosis, expected treatments, and that is were discussed with her husband at the bedside. All questions were answered.   Disposition Plan:  At the time of admission, it appears that the appropriate admission status for this patient is INPATIENT. This is judged to be reasonable and necessary in order to provide the required intensity of service to ensure the patient's safety given the presenting symptoms, physical exam findings, and initial radiographic and laboratory data in the context of their chronic comorbidities.  Together, these circumstances are felt to place her/him at high risk for further clinical deterioration threatening life, limb, or organ.    Edwin Dada [facility] Pager [phone]

## 2015-08-22 NOTE — ED Notes (Signed)
Report given to rn on 2c 

## 2015-08-23 ENCOUNTER — Ambulatory Visit: Payer: 59 | Admitting: *Deleted

## 2015-08-23 ENCOUNTER — Inpatient Hospital Stay (HOSPITAL_COMMUNITY): Payer: 59

## 2015-08-23 DIAGNOSIS — Z515 Encounter for palliative care: Secondary | ICD-10-CM

## 2015-08-23 DIAGNOSIS — M7989 Other specified soft tissue disorders: Secondary | ICD-10-CM

## 2015-08-23 DIAGNOSIS — R0602 Shortness of breath: Secondary | ICD-10-CM | POA: Insufficient documentation

## 2015-08-23 LAB — PROCALCITONIN

## 2015-08-23 LAB — CBC
HCT: 35.5 % — ABNORMAL LOW (ref 36.0–46.0)
HEMOGLOBIN: 10.7 g/dL — AB (ref 12.0–15.0)
MCH: 25.1 pg — ABNORMAL LOW (ref 26.0–34.0)
MCHC: 30.1 g/dL (ref 30.0–36.0)
MCV: 83.3 fL (ref 78.0–100.0)
PLATELETS: 382 10*3/uL (ref 150–400)
RBC: 4.26 MIL/uL (ref 3.87–5.11)
RDW: 18 % — ABNORMAL HIGH (ref 11.5–15.5)
WBC: 21.6 10*3/uL — ABNORMAL HIGH (ref 4.0–10.5)

## 2015-08-23 LAB — TROPONIN I: Troponin I: 0.07 ng/mL — ABNORMAL HIGH (ref <0.031)

## 2015-08-23 LAB — COMPREHENSIVE METABOLIC PANEL
ALBUMIN: 2.8 g/dL — AB (ref 3.5–5.0)
ALK PHOS: 82 U/L (ref 38–126)
ALT: 28 U/L (ref 14–54)
ANION GAP: 10 (ref 5–15)
AST: 27 U/L (ref 15–41)
BUN: 19 mg/dL (ref 6–20)
CALCIUM: 8.7 mg/dL — AB (ref 8.9–10.3)
CHLORIDE: 97 mmol/L — AB (ref 101–111)
CO2: 29 mmol/L (ref 22–32)
Creatinine, Ser: 1.14 mg/dL — ABNORMAL HIGH (ref 0.44–1.00)
GFR calc Af Amer: >60 mL/min (ref >60)
GFR calc non Af Amer: 53 mL/min — ABNORMAL LOW (ref >60)
GLUCOSE: 391 mg/dL — AB (ref 65–99)
Potassium: 4.1 mmol/L (ref 3.5–5.1)
SODIUM: 136 mmol/L (ref 135–145)
Total Bilirubin: 0.7 mg/dL (ref 0.3–1.2)
Total Protein: 6.3 g/dL — ABNORMAL LOW (ref 6.5–8.1)

## 2015-08-23 LAB — GLUCOSE, CAPILLARY
GLUCOSE-CAPILLARY: 253 mg/dL — AB (ref 65–99)
GLUCOSE-CAPILLARY: 397 mg/dL — AB (ref 65–99)
Glucose-Capillary: 244 mg/dL — ABNORMAL HIGH (ref 65–99)
Glucose-Capillary: 382 mg/dL — ABNORMAL HIGH (ref 65–99)
Glucose-Capillary: 310 mg/dL — ABNORMAL HIGH (ref 65–99)

## 2015-08-23 MED ORDER — WHITE PETROLATUM GEL
Status: AC
  Filled 2015-08-23: qty 1

## 2015-08-23 MED ORDER — INSULIN GLARGINE 100 UNIT/ML ~~LOC~~ SOLN
25.0000 [IU] | Freq: Two times a day (BID) | SUBCUTANEOUS | Status: DC
  Administered 2015-08-23 (×2): 25 [IU] via SUBCUTANEOUS
  Filled 2015-08-23 (×3): qty 0.25

## 2015-08-23 MED ORDER — FLUTICASONE PROPIONATE 50 MCG/ACT NA SUSP
2.0000 | Freq: Every day | NASAL | Status: DC
  Administered 2015-08-23 – 2015-08-27 (×5): 2 via NASAL
  Filled 2015-08-23 (×2): qty 16

## 2015-08-23 MED ORDER — VANCOMYCIN HCL IN DEXTROSE 1-5 GM/200ML-% IV SOLN
1000.0000 mg | Freq: Two times a day (BID) | INTRAVENOUS | Status: DC
  Administered 2015-08-23 – 2015-08-26 (×6): 1000 mg via INTRAVENOUS
  Filled 2015-08-23 (×7): qty 200

## 2015-08-23 MED ORDER — DILTIAZEM HCL 25 MG/5ML IV SOLN
10.0000 mg | Freq: Once | INTRAVENOUS | Status: AC
Start: 2015-08-23 — End: 2015-08-23
  Administered 2015-08-23: 10 mg via INTRAVENOUS
  Filled 2015-08-23 (×2): qty 5

## 2015-08-23 MED ORDER — LORAZEPAM 2 MG/ML IJ SOLN
1.0000 mg | Freq: Once | INTRAMUSCULAR | Status: AC
  Administered 2015-08-23: 1 mg via INTRAVENOUS
  Filled 2015-08-23: qty 1

## 2015-08-23 MED ORDER — FUROSEMIDE 10 MG/ML IJ SOLN
60.0000 mg | Freq: Three times a day (TID) | INTRAMUSCULAR | Status: DC
  Administered 2015-08-23 – 2015-08-27 (×12): 60 mg via INTRAVENOUS
  Filled 2015-08-23 (×11): qty 6

## 2015-08-23 NOTE — Consult Note (Signed)
Consultation Note Date: 08/23/2015   Patient Name: Joann [name]  DOB: 02/19/1961  MRN: [id number]  Age / Sex: 54 y.o., female   PCP: Scott Holwerda, MD Referring Physician: Nayana Abrol, MD  Reason for Consultation: GOC  Palliative Care Assessment and Plan Summary of Established Goals of Care and Medical Treatment Preferences    Palliative Care Discussion Held Today:   I met today with Ms. Anchondo along with her brother at bedside. Ms. Tonche is tearful as she tells me that she doesn't think she will be able to get transplant. She says that transplant centers told her that she would have to weigh 130 lbs to be considered for transplant. Ms. Brester says that she has not weighed that little in many, many years and does not see how she could get to that weight in time before her lungs give out. She says that if this is the case she would rather spend her time eating what made her happy.   Ms. Privette asked about hospice so we discussed how they could help her at home. She wants to have BiPAP at home as she feels this helps her feel better and would be a comfort measure - I do know that hospice has provided BiPAP in home for comfort with past patients. We also discussed the role of roxanol to manage symptoms as the morphine has helped her here. She does not want to die in the hospital and says she wants to die at home surrounded by her dogs.   She is not scared of dying but of leaving her family. She has great support. She has a husband and 2 sons (1 she says is addicted to drugs). She is very spiritual and talks of God and prayers. Emotional support provided. She is awaiting to hear back from Dr. Ramaswamy but understands it is unlikely to be worth the risk for her to travel to [place].   - Await to hear recs from Dr. Ramaswamy on whether to try and get to [place] for appt Monday - Open to hospice at home - BiPAP at home for comfort - Roxanol at home for comfort - Continue chronic steroids as she  says she declines every time these are stopped   Contacts/Participants in Discussion: Primary Decision Maker: Patient  Goals of Care/Code Status/Advance Care Planning:   Code Status: DNR   Symptom Management:   Dyspnea: BiPAP as comfort measure. Recommend roxanol 5 mg every   Psycho-social/Spiritual:   Support System: Wonderful support according to patient. Brother, husband, son and friends.   Desire for further Chaplaincy support: yes  Prognosis: Unlikely to get transplant and without transplant < 6 months.   Discharge Planning:  Likely home with hospice.        Chief Complaint/HPI: 54 yo female with severe pulmonary fibrosis on 8 L oxygen at home. PMH of diabetes, diastolic heart failure, h/o kidney cancer, HTN, OSA, fatty liver. Prognosis appears to be poor and transplant unlikely.   Primary Diagnoses  Present on Admission:  . Acute on chronic respiratory failure (HCC) . HTN (hypertension) . Chronic anemia . Hypothyroidism . IPF (idiopathic pulmonary fibrosis) (HCC) . OSA (obstructive sleep apnea) . Acute and chronic respiratory failure (acute-on-chronic) (HCC)  Palliative Review of Systems:   + dyspnea   I have reviewed the medical record, interviewed the patient and family, and examined the patient. The following aspects are pertinent.  Past Medical History  Diagnosis Date  . Asthma   . Diabetes mellitus   .   Headache(784.0)     MIGRAINES  . Fibromyalgia   . GERD (gastroesophageal reflux disease)   . IBS (irritable bowel syndrome)   . Renal mass, right   . IC (interstitial cystitis)   . Frequency of urination   . Anemia   . Swelling of both lower extremities     TAKES MAXIDE FOR SWELLING (DENIES HBP)  . Hypothyroidism   . Depression   . Cancer ([place])     kidney  . Hypertension   . Wears glasses   . Sleep apnea     uses a cpap  . Pulmonary fibrosis ([place])   . Pneumonia   . Fatty liver   . Oxygen dependent     2 Liters at rest, 3 liters with  activity   Social History   Social History  . Marital Status: Married    Spouse Name: N/A  . Number of Children: N/A  . Years of Education: N/A   Social History Main Topics  . Smoking status: Never Smoker   . Smokeless tobacco: Never Used  . Alcohol Use: No  . Drug Use: No  . Sexual Activity: Not Asked   Other Topics Concern  . None   Social History Narrative   Family History  Problem Relation Age of Onset  . Cancer Mother     breast, lung  . COPD Mother   . Hyperlipidemia Mother   . Other Mother     BOOP  . COPD Father   . Hypertension Father   . Hyperlipidemia Father   . Stroke Father    Scheduled Meds: . dicyclomine  20 mg Oral 4 times per day  . DULoxetine  60 mg Oral q morning - 10a  . enoxaparin (LOVENOX) injection  40 mg Subcutaneous Q24H  . fluticasone  2 spray Each Nare Daily  . furosemide  60 mg Intravenous 3 times per day  . insulin aspart  0-15 Units Subcutaneous TID WC  . insulin aspart  0-5 Units Subcutaneous QHS  . insulin glargine  45 Units Subcutaneous QHS  . levothyroxine  200 mcg Oral QAC breakfast  . methylPREDNISolone (SOLU-MEDROL) injection  125 mg Intravenous Q12H  . pantoprazole  40 mg Oral Daily  . piperacillin-tazobactam (ZOSYN)  IV  3.375 g Intravenous 3 times per day  . potassium chloride SA  20 mEq Oral Daily  . sodium chloride  3 mL Intravenous Q12H  . topiramate  25 mg Oral BID  . traZODone  150 mg Oral QHS   Continuous Infusions:  PRN Meds:.ALPRAZolam, guaiFENesin, iohexol, morphine injection Medications Prior to Admission:  Prior to Admission medications   Medication Sig Start Date End Date Taking? Authorizing Provider  ALPRAZolam Duanne Moron) 0.5 MG tablet Take 0.5 mg by mouth daily as needed for anxiety.    Yes Historical Provider, MD  cholecalciferol (VITAMIN D) 1000 UNITS tablet Take 1,000 Units by mouth every morning.    Yes Historical Provider, MD  dicyclomine (BENTYL) 20 MG tablet Take 20 mg by mouth every 6 (six) hours.    Yes Historical Provider, MD  DULoxetine (CYMBALTA) 60 MG capsule Take 60 mg by mouth every morning.    Yes Historical Provider, MD  esomeprazole (NEXIUM) 40 MG capsule Take 40 mg by mouth 2 (two) times daily before a meal.    Yes Historical Provider, MD  estrogens, conjugated, (PREMARIN) 0.625 MG tablet Take 0.625 mg by mouth every morning.    Yes Historical Provider, MD  guaiFENesin (MUCINEX) 600 MG 12 hr tablet  Take 1 tablet (600 mg total) by mouth 2 (two) times daily. Patient taking differently: Take 600 mg by mouth 2 (two) times daily as needed for cough or to loosen phlegm.  03/25/15  Yes Erin R Barrett, PA-C  insulin aspart (NOVOLOG) 100 UNIT/ML injection Inject 0-9 Units into the skin 3 (three) times daily with meals. Patient taking differently: Inject 15 Units into the skin 3 (three) times daily with meals. Can adjust dose up or down according to meal size. 05/20/15  Yes Paul W Hoffman, NP  insulin glargine (LANTUS) 100 UNIT/ML injection Inject 0.45 mLs (45 Units total) into the skin at bedtime. 05/20/15  Yes Paul W Hoffman, NP  levothyroxine (SYNTHROID, LEVOTHROID) 200 MCG tablet Take 200 mcg by mouth daily before breakfast.   Yes Historical Provider, MD  lisinopril (PRINIVIL,ZESTRIL) 20 MG tablet Take 20 mg by mouth daily.   Yes Historical Provider, MD  metFORMIN (GLUCOPHAGE-XR) 500 MG 24 hr tablet Take 2 tablets by mouth 2 (two) times daily. 07/22/15  Yes Historical Provider, MD  Multiple Vitamins-Minerals (MULTIVITAMIN GUMMIES WOMENS PO) Take by mouth daily.   Yes Historical Provider, MD  Pirfenidone (ESBRIET) 267 MG CAPS 3 caps TID Patient taking differently: Take 3 capsules by mouth 3 (three) times daily. 3 caps TID 05/29/15  Yes Murali Ramaswamy, MD  potassium chloride SA (K-DUR,KLOR-CON) 20 MEQ tablet Take 20 mEq by mouth daily.   Yes Historical Provider, MD  predniSONE (DELTASONE) 20 MG tablet Take 2 tablets (40 mg total) by mouth daily with breakfast. 07/18/15  Yes Murali Ramaswamy, MD    topiramate (TOPAMAX) 25 MG tablet Take 25 mg by mouth 2 (two) times daily.   Yes Historical Provider, MD  traZODone (DESYREL) 150 MG tablet Take 150 mg by mouth at bedtime.    Yes Historical Provider, MD  vitamin E 400 UNIT capsule Take 400 Units by mouth 2 (two) times daily.    Yes Historical Provider, MD  albuterol (PROVENTIL) (2.5 MG/3ML) 0.083% nebulizer solution Take 3 mLs (2.5 mg total) by nebulization every 2 (two) hours as needed for wheezing. 05/20/15   Paul W Hoffman, NP  ipratropium-albuterol (DUONEB) 0.5-2.5 (3) MG/3ML SOLN Take 3 mLs by nebulization every 6 (six) hours. 05/20/15   Paul W Hoffman, NP  meclizine (ANTIVERT) 25 MG tablet Take 25 mg by mouth 3 (three) times daily as needed for dizziness.    Historical Provider, MD  ondansetron (ZOFRAN) 4 MG tablet Take 4 mg by mouth every 8 (eight) hours as needed for nausea or vomiting.    Historical Provider, MD  oxyCODONE (OXY IR/ROXICODONE) 5 MG immediate release tablet Take 1-2 tablets (5-10 mg total) by mouth every 4 (four) hours as needed for severe pain. 04/19/15   Bryan K Bartle, MD   Allergies  Allergen Reactions  . Other Anaphylaxis    WALNUTS  . Peanut-Containing Drug Products Shortness Of Breath    Pecans, walnuts as well  . Peanuts [Peanut Oil] Shortness Of Breath  . Pecan Pollen Shortness Of Breath  . Sulfa Antibiotics Hives and Shortness Of Breath  . Betadine [Povidone Iodine] Rash  . Latex Rash   CBC:    Component Value Date/Time   WBC 13.0* 08/22/2015 0354   WBC 9.2 11/17/2013 1327   HGB 10.0* 08/22/2015 0354   HGB 14.0 11/17/2013 1327   HCT 33.6* 08/22/2015 0354   HCT 41.8 11/17/2013 1327   PLT 309 08/22/2015 0354   PLT 267 11/17/2013 1327   MCV 84.2 08/22/2015 0354     MCV 88 11/17/2013 1327   NEUTROABS 8.5* 05/18/2015 0500   NEUTROABS 5.7 11/17/2013 1327   LYMPHSABS 3.6 05/18/2015 0500   LYMPHSABS 2.1 11/17/2013 1327   MONOABS 0.9 05/18/2015 0500   MONOABS 0.8 11/17/2013 1327   EOSABS 0.4 05/18/2015  0500   EOSABS 0.5 11/17/2013 1327   BASOSABS 0.0 05/18/2015 0500   BASOSABS 0.1 11/17/2013 1327   Comprehensive Metabolic Panel:    Component Value Date/Time   NA 139 08/22/2015 0354   NA 136 11/17/2013 1327   K 3.3* 08/22/2015 0354   K 4.1 11/17/2013 1327   CL 102 08/22/2015 0354   CL 96* 11/17/2013 1327   CO2 29 08/22/2015 0354   CO2 25 11/17/2013 1327   BUN 9 08/22/2015 0354   BUN 16 11/17/2013 1327   CREATININE 0.88 08/22/2015 0354   CREATININE 1.24 11/17/2013 1327   GLUCOSE 128* 08/22/2015 0354   GLUCOSE 95 11/17/2013 1327   CALCIUM 8.4* 08/22/2015 0354   CALCIUM 9.6 11/17/2013 1327   AST 19 08/21/2015 2018   AST 30 11/17/2013 1327   ALT 28 08/21/2015 2018   ALT 39 11/17/2013 1327   ALKPHOS 82 08/21/2015 2018   ALKPHOS 99 11/17/2013 1327   BILITOT 0.4 08/21/2015 2018   BILITOT 0.3 11/17/2013 1327   PROT 5.9* 08/21/2015 2018   PROT 7.6 11/17/2013 1327   ALBUMIN 2.9* 08/21/2015 2018   ALBUMIN 3.9 11/17/2013 1327    Physical Exam:  Vital Signs: BP 161/87 mmHg  Pulse 121  Temp(Src) 97.2 F (36.2 C) (Axillary)  Resp 26  Ht 5' (1.524 m)  Wt 84 kg (185 lb 3 oz)  BMI 36.17 kg/m2  SpO2 76% SpO2: SpO2: (!) 76 % O2 Device: O2 Device: Nasal Cannula O2 Flow Rate: O2 Flow Rate (L/min): 10 L/min Intake/output summary:  Intake/Output Summary (Last 24 hours) at 08/23/15 1313 Last data filed at 08/23/15 0900  Gross per 24 hour  Intake 791.25 ml  Output   1500 ml  Net -708.75 ml   LBM: Last BM Date: 08/22/15 Baseline Weight: Weight: 82.555 kg (182 lb) Most recent weight: Weight: 84 kg (185 lb 3 oz)  Exam Findings:   General: Sitting up in bed, obese, pleasant  HEENT: [place]/AT CVS: RRR Resp: Slightly labored at baseline Neuro: Awake, alert, oriented x 3           Palliative Performance Scale: 30 %                Additional Data Reviewed: Recent Labs     08/21/15  2018  08/22/15  0354  WBC  14.5*  13.0*  HGB  10.8*  10.0*  PLT  364  309  NA  137   139  BUN  9  9  CREATININE  0.98  0.88     Time In: 1155 Time Out: 1315 Time Total: 37mn  Greater than 50%  of this time was spent counseling and coordinating care related to the above assessment and plan.   Signed by:  AVinie Sill NP Palliative Medicine Team Pager # [phone](M-F 8a-5p) Team Phone # [phone](Nights/Weekends)

## 2015-08-23 NOTE — Telephone Encounter (Signed)
May give copy of sleep study to her, it looks it is also available to her in [place]. Sleep study appeared negative for sleep apnea.  On reviewing notes it appears that she is not going to [facility] at this time as she is not a transplant candidate.

## 2015-08-23 NOTE — Progress Notes (Signed)
Nutrition Consult/Brief Note  RD consulted for weight loss education.    Pt is currently speaking with Palliative Care Team.  Pt has been seen for weight loss by outpatient RD, Derek Mound with [facility].  Handouts were given on [employer] Program and Making Healthier Choices When Eating Out.  Pt is currently on a Carbohydrate Modified diet and able to choose food options conducive to a low calorie diet.  Wt Readings from Last 15 Encounters:  08/23/15 185 lb 3 oz (84 kg)  08/01/15 185 lb 3.2 oz (84.006 kg)  07/19/15 183 lb (83.008 kg)  07/09/15 182 lb (82.555 kg)  07/02/15 182 lb (82.555 kg)  06/26/15 184 lb (83.462 kg)  06/10/15 176 lb (79.833 kg)  05/28/15 176 lb (79.833 kg)  05/20/15 177 lb 4.8 oz (80.423 kg)  05/03/15 184 lb 6.4 oz (83.643 kg)  04/30/15 181 lb (82.101 kg)  04/25/15 181 lb 4.8 oz (82.237 kg)  04/19/15 178 lb (80.74 kg)  04/16/15 180 lb (81.647 kg)  04/09/15 178 lb (80.74 kg)    Body mass index is 36.17 kg/(m^2). Patient meets criteria for Obesity Class II based on current BMI.   Patient is consuming approximately 100% of meals at this time. Labs and medications reviewed.   No nutrition interventions warranted at this time. If nutrition issues arise, please consult RD.   Arthur Holms, RD, LDN Pager #: [phone] After-Hours Pager #: [phone]

## 2015-08-23 NOTE — Progress Notes (Signed)
   D/w Dr Omer Jack 12:16 PM 08/23/2015  - lung transplant medical director at [facility]   - with Body mass index is 36.17 kg/(m^2). definitely NOT  A candidate for transplant -their goal is BMI  < 30 ([place] is < 26)  - no point patient going to [facility] other than for medical mgmt which would be same as [facility] - he agreed transfer by road on patient car to [place] is very risky  Plan   - cotninue med mgmt at [facility]  = cancel road trip to [facility] - patient to be advised (I will call her - I Am at remote location)   Dr. Brand Males, M.D., F.C.C.P Pulmonary and Critical Care Medicine Staff Physician [facility] Pager: [phone], If no answer or between  15:00h - 7:00h: call [phone]  08/23/2015 12:16 PM

## 2015-08-23 NOTE — Progress Notes (Addendum)
[facility] PROGRESS NOTE  Joann [name] TIR:[id number] DOB: June 11, 1961 DOA: 08/21/2015 PCP: Velna Hatchet, MD  Length of stay: 2   Assessment/Plan: Principal Problem:   Acute on chronic respiratory failure ([place]) Active Problems:   DM2 (diabetes mellitus, type 2) ([place])   HTN (hypertension)   Diabetes mellitus type 2, controlled ([place])   Chronic anemia   Hypothyroidism   IPF (idiopathic pulmonary fibrosis) ([facility])   OSA (obstructive sleep apnea)   Acute and chronic respiratory failure (acute-on-chronic) ([place])    1. Acute on chronic respiratory failure:  The patient has low-grade fever and worsening hypoxia consistent with IPF flare. Vs viral infection or HCAP , no evidence of PE, We will duplex negative Appreciate pulmonary input -Levofloxacin DC , switch to vanc and zosyn,for broader coverage for HCAP -High-dose methylprednisolone for 2 days. Then switched to by mouth prednisone Pulmonology recommends to discontinue Esbriet Initiate Lasix given history of acute diastolic heart failure  Sinus tachycardia, likely secondary to hypoxemic respiratory failure,anxiety, repeat EKG and troponin, patient endorses no chest pain, repeat 2-D echo pending   2. NIDDM:  Uncontrolled secondary to IV steroids Increase Lantus Discontinue metformin -Sliding scale corrections  3. HTN:  Stable. DC  Lisinopril as pt will receive IV contrast with CT   4. Hypothyroidism:  Stable. -Continue home levothyroxine -TSH is ordered  5. Chronic normocytic anemia:  Stable.   6. Depression and anxiety:  Stable.  -Continue home duloxetine and alprazolam and topiramate   DVT prophylaxsis   Code Status:      Code Status Orders        Start     Ordered   08/22/15 0121  Do not attempt resuscitation (DNR)   Continuous    Question Answer Comment  In the event of cardiac or respiratory ARREST Do not call a "code blue"   In the event of cardiac or respiratory ARREST Do not  perform Intubation, CPR, defibrillation or ACLS   In the event of cardiac or respiratory ARREST Use medication by any route, position, wound care, and other measures to relive pain and suffering. May use oxygen, suction and manual treatment of airway obstruction as needed for comfort.      08/22/15 0121     Family Communication: family updated about patient's clinical progress Disposition Plan:  Cont stepdown , pulmonary consult    Brief narrative: Joann [name] is a 54 y.o. female with a past medical history significant for IPF on 8 L home oxygen, referred to lung transplant center at [facility], IDDM, HTN, anemia, hypothyroidism, and OSA who presents with low-grade fever and increasing oxygen demands.  The patient was in her usual state of health until the last few days when she has felt increasing shortness of breath and requiring additional oxygen at night. She is also noted low-grade fevers and an increase in sputum during this time. Today she needed to turn her oxygen to 10 L and saw that she was desaturating with any exertion.  In the ED, the patient was hypoxic with exertion. She had an elevated WBC. She had a low-grade fever. Her BNP was normal. Her chest x-ray was unchanged. She was admitted to [facility] for IPF flare versus HCAP.  Consultants:  Pulmonary  Procedures:  None  Antibiotics: Anti-infectives    Start     Dose/Rate Route Frequency Ordered Stop   08/23/15 1330  vancomycin (VANCOCIN) IVPB 1000 mg/200 mL premix     1,000 mg 200 mL/hr over  60 Minutes Intravenous Every 12 hours 08/23/15 1326     08/22/15 2300  levofloxacin (LEVAQUIN) IVPB 750 mg  Status:  Discontinued     750 mg 100 mL/hr over 90 Minutes Intravenous Every 24 hours 08/22/15 0121 08/22/15 0826   08/22/15 1030  vancomycin (VANCOCIN) 1,500 mg in sodium chloride 0.9 % 500 mL IVPB     1,500 mg 250 mL/hr over 120 Minutes Intravenous  Once 08/22/15 1011 08/22/15 1230   08/22/15 1000  piperacillin-tazobactam (ZOSYN)  IVPB 3.375 g     3.375 g 12.5 mL/hr over 240 Minutes Intravenous 3 times per day 08/22/15 0839     08/21/15 2300  levofloxacin (LEVAQUIN) IVPB 750 mg     750 mg 100 mL/hr over 90 Minutes Intravenous  Once 08/21/15 2252 08/22/15 0045         HPI/Subjective: Patient severely hypoxic on BiPAP, able to speak in full sentences Objective: Filed Vitals:   08/23/15 0600 08/23/15 0733 08/23/15 0752 08/23/15 1243  BP: 131/73 131/73 136/70 161/87  Pulse: 94 97 98 121  Temp:   97.2 F (36.2 C)   TempSrc:   Axillary   Resp:  29 28 26   Height:      Weight:      SpO2: 92% 95% 96% 76%    Intake/Output Summary (Last 24 hours) at 08/23/15 1421 Last data filed at 08/23/15 1346  Gross per 24 hour  Intake 1031.25 ml  Output   1500 ml  Net -468.75 ml    Exam:  General: No acute respiratory distress Lungs: Clear to auscultation bilaterally without wheezes or crackles Cardiovascular: Regular rate and rhythm without murmur gallop or rub normal S1 and S2 Abdomen: Nontender, nondistended, soft, bowel sounds positive, no rebound, no ascites, no appreciable mass Extremities: No significant cyanosis, clubbing, or edema bilateral lower extremities     Data Review   Micro Results Recent Results (from the past 240 hour(s))  MRSA PCR Screening     Status: None   Collection Time: 08/22/15  1:23 AM  Result Value Ref Range Status   MRSA by PCR NEGATIVE NEGATIVE Final    Comment:        The GeneXpert MRSA Assay (FDA approved for NASAL specimens only), is one component of a comprehensive MRSA colonization surveillance program. It is not intended to diagnose MRSA infection nor to guide or monitor treatment for MRSA infections.     Radiology Reports Dg Chest 2 View  08/21/2015  CLINICAL DATA:  Pulmonary fibrosis.  Increasing shortness of breath EXAM: CHEST  2 VIEW COMPARISON:  August 05, 2015 FINDINGS: Widespread pulmonary fibrosis is present bilaterally, stable. There is no new  opacities to suggest superimposed edema or consolidation. Heart is upper normal in size with pulmonary vascularity within normal limits. No adenopathy. There is postoperative change in the lower cervical spine. IMPRESSION: Widespread interstitial fibrosis, stable. No new opacity apparent. No change in cardiac silhouette. It must be cautioned that subtle superimposed pneumonia could easily be obscured by this degree of extensive fibrosis. Electronically Signed   By: Lowella Grip III M.D.   On: 08/21/2015 21:16   Dg Chest 2 View  07/26/2015  CLINICAL DATA:  Chronic pulmonary fibrosis. Chronic shortness of breath. EXAM: CHEST  2 VIEW COMPARISON:  05/18/2015 FINDINGS: The heart is enlarged but stable. Prominent mediastinal and hilar contours are unchanged. Chronic pulmonary changes of pulmonary fibrosis. No definite acute overlying pulmonary process. No pleural effusion. Bilateral cervical ribs are noted. IMPRESSION: Chronic changes of pulmonary  fibrosis. No acute overlying pulmonary findings. Electronically Signed   By: Marijo Sanes M.D.   On: 07/26/2015 15:18   Ct Angio Chest Pe W/cm &/or Wo Cm  08/22/2015  CLINICAL DATA:  Worsening shortness of breath. History of pulmonary fibrosis. EXAM: CT ANGIOGRAPHY CHEST WITH CONTRAST TECHNIQUE: Multidetector CT imaging of the chest was performed using the standard protocol during bolus administration of intravenous contrast. Multiplanar CT image reconstructions and MIPs were obtained to evaluate the vascular anatomy. CONTRAST:  100 cc Omnipaque 350. COMPARISON:  CT of the chest 05/06/2015 FINDINGS: There is a small pericardial effusion. Thoracic aorta is normal in caliber. The main pulmonary artery is enlarged. There is no evidence of pulmonary embolus. The heart is enlarged. There is a small pericardial effusion. Atherosclerotic disease of the aorta is seen. Visualized thyroid is unremarkable. There is anterior mediastinal lymphadenopathy, and bilateral hilar  lymphadenopathy. The largest lymph node in the left perivascular location measures 1.8 cm. No pathologically enlarged axillary lymph nodes are seen. Airways are patent. Evaluation of the lungs demonstrates widespread bilateral alveolar and airspace consolidation. Calcified right pleural plaque and irregular right pleural thickening is also seen. There is a particularly prominent area of nodular pleural and subpleural thickening in the anterior right upper lobe, with associated pleural calcifications ( image 45 axial images). Visualized upper abdomen is unremarkable. No evidence of acute osseous abnormality. Lower cervical spine fusion is noted. IMPRESSION: Enlarged heart with small pericardial effusion. Prominent main pulmonary artery, suggestive of pulmonary arterial hypertension. Atherosclerotic disease of the aorta. No evidence of pulmonary embolus. Stable anterior mediastinal lymphadenopathy. Severe widespread bilateral alveolar an airspace consolidation, similar to slightly worse than the prior study. This may represent chronic interstitial lung disease. Element of superimposed pulmonary edema cannot be excluded. Right pleural calcifications and irregular pleural thickening. Query prior trauma or asbestos exposure. Electronically Signed   By: Fidela Salisbury M.D.   On: 08/22/2015 18:09     CBC  Recent Labs Lab 08/21/15 2018 08/22/15 0354  WBC 14.5* 13.0*  HGB 10.8* 10.0*  HCT 36.4 33.6*  PLT 364 309  MCV 84.3 84.2  MCH 25.0* 25.1*  MCHC 29.7* 29.8*  RDW 18.0* 18.1*    Chemistries   Recent Labs Lab 08/21/15 2018 08/22/15 0354  NA 137 139  K 3.5 3.3*  CL 100* 102  CO2 27 29  GLUCOSE 223* 128*  BUN 9 9  CREATININE 0.98 0.88  CALCIUM 8.9 8.4*  AST 19  --   ALT 28  --   ALKPHOS 82  --   BILITOT 0.4  --    ------------------------------------------------------------------------------------------------------------------ estimated creatinine clearance is 69.4 mL/min (by C-G  formula based on Cr of 0.88). ------------------------------------------------------------------------------------------------------------------ No results for input(s): HGBA1C in the last 72 hours. ------------------------------------------------------------------------------------------------------------------ No results for input(s): CHOL, HDL, LDLCALC, TRIG, CHOLHDL, LDLDIRECT in the last 72 hours. ------------------------------------------------------------------------------------------------------------------  Recent Labs  08/22/15 0354  TSH 0.691   ------------------------------------------------------------------------------------------------------------------ No results for input(s): VITAMINB12, FOLATE, FERRITIN, TIBC, IRON, RETICCTPCT in the last 72 hours.  Coagulation profile No results for input(s): INR, PROTIME in the last 168 hours.   Recent Labs  08/22/15 1312  DDIMER 1.42*    Cardiac Enzymes No results for input(s): CKMB, TROPONINI, MYOGLOBIN in the last 168 hours.  Invalid input(s): CK ------------------------------------------------------------------------------------------------------------------ Invalid input(s): POCBNP   CBG:  Recent Labs Lab 08/22/15 1206 08/22/15 1643 08/22/15 2130 08/23/15 0740 08/23/15 1240  GLUCAP 265* 326* 289* 244* 253*       Studies: Dg Chest 2 View  08/21/2015  CLINICAL DATA:  Pulmonary fibrosis.  Increasing shortness of breath EXAM: CHEST  2 VIEW COMPARISON:  August 05, 2015 FINDINGS: Widespread pulmonary fibrosis is present bilaterally, stable. There is no new opacities to suggest superimposed edema or consolidation. Heart is upper normal in size with pulmonary vascularity within normal limits. No adenopathy. There is postoperative change in the lower cervical spine. IMPRESSION: Widespread interstitial fibrosis, stable. No new opacity apparent. No change in cardiac silhouette. It must be cautioned that subtle  superimposed pneumonia could easily be obscured by this degree of extensive fibrosis. Electronically Signed   By: Lowella Grip III M.D.   On: 08/21/2015 21:16   Ct Angio Chest Pe W/cm &/or Wo Cm  08/22/2015  CLINICAL DATA:  Worsening shortness of breath. History of pulmonary fibrosis. EXAM: CT ANGIOGRAPHY CHEST WITH CONTRAST TECHNIQUE: Multidetector CT imaging of the chest was performed using the standard protocol during bolus administration of intravenous contrast. Multiplanar CT image reconstructions and MIPs were obtained to evaluate the vascular anatomy. CONTRAST:  100 cc Omnipaque 350. COMPARISON:  CT of the chest 05/06/2015 FINDINGS: There is a small pericardial effusion. Thoracic aorta is normal in caliber. The main pulmonary artery is enlarged. There is no evidence of pulmonary embolus. The heart is enlarged. There is a small pericardial effusion. Atherosclerotic disease of the aorta is seen. Visualized thyroid is unremarkable. There is anterior mediastinal lymphadenopathy, and bilateral hilar lymphadenopathy. The largest lymph node in the left perivascular location measures 1.8 cm. No pathologically enlarged axillary lymph nodes are seen. Airways are patent. Evaluation of the lungs demonstrates widespread bilateral alveolar and airspace consolidation. Calcified right pleural plaque and irregular right pleural thickening is also seen. There is a particularly prominent area of nodular pleural and subpleural thickening in the anterior right upper lobe, with associated pleural calcifications ( image 45 axial images). Visualized upper abdomen is unremarkable. No evidence of acute osseous abnormality. Lower cervical spine fusion is noted. IMPRESSION: Enlarged heart with small pericardial effusion. Prominent main pulmonary artery, suggestive of pulmonary arterial hypertension. Atherosclerotic disease of the aorta. No evidence of pulmonary embolus. Stable anterior mediastinal lymphadenopathy. Severe  widespread bilateral alveolar an airspace consolidation, similar to slightly worse than the prior study. This may represent chronic interstitial lung disease. Element of superimposed pulmonary edema cannot be excluded. Right pleural calcifications and irregular pleural thickening. Query prior trauma or asbestos exposure. Electronically Signed   By: Fidela Salisbury M.D.   On: 08/22/2015 18:09      Lab Results  Component Value Date   HGBA1C 8.6* 02/27/2015   Lab Results  Component Value Date   CREATININE 0.88 08/22/2015       Scheduled Meds: . dicyclomine  20 mg Oral 4 times per day  . DULoxetine  60 mg Oral q morning - 10a  . enoxaparin (LOVENOX) injection  40 mg Subcutaneous Q24H  . fluticasone  2 spray Each Nare Daily  . furosemide  60 mg Intravenous 3 times per day  . insulin aspart  0-15 Units Subcutaneous TID WC  . insulin aspart  0-5 Units Subcutaneous QHS  . insulin glargine  45 Units Subcutaneous QHS  . levothyroxine  200 mcg Oral QAC breakfast  . methylPREDNISolone (SOLU-MEDROL) injection  125 mg Intravenous Q12H  . pantoprazole  40 mg Oral Daily  . piperacillin-tazobactam (ZOSYN)  IV  3.375 g Intravenous 3 times per day  . potassium chloride SA  20 mEq Oral Daily  . sodium chloride  3 mL Intravenous Q12H  . topiramate  25 mg Oral BID  . traZODone  150 mg Oral QHS  . vancomycin  1,000 mg Intravenous Q12H   Continuous Infusions:   Principal Problem:   Acute on chronic respiratory failure ([facility]) Active Problems:   DM2 (diabetes mellitus, type 2) ([facility])   HTN (hypertension)   Diabetes mellitus type 2, controlled ([facility])   Chronic anemia   Hypothyroidism   IPF (idiopathic pulmonary fibrosis) ([facility])   OSA (obstructive sleep apnea)   Acute and chronic respiratory failure (acute-on-chronic) ([place])    Time spent: 45 minutes   [facility] Pager [phone]. If 7PM-7AM, please contact night-coverage at [email or web address], password [facility] 08/23/2015,  2:21 PM  LOS: 2 days

## 2015-08-23 NOTE — Progress Notes (Signed)
*  Preliminary Results* Bilateral lower extremity venous duplex completed. Bilateral lower extremities are negative for deep vein thrombosis. There is no evidence of Baker's cyst bilaterally.  08/23/2015  Maudry Mayhew, RVT, RDCS, RDMS

## 2015-08-23 NOTE — Consult Note (Addendum)
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 Presented 10/12 with increased SOB, fever, increased cough with purulent sputum.  Admitted by [facility] with HCAP v IPF flare and started on IV abx, IV steroids.  She had minimal improvement and on 10/13 had increased dyspnea/WOB and hypoxia despite bipap and PCCM consulted.     STUDIES/eVENT:    SUBJECTIVE/OVERNIGHT/INTERVAL HX 08/23/2015  08/23/2015: According to the bedside nurse patient desaturated to 50%-60% on 10 L oxygen while having breakfast this morning and therefore placed on BiPAP with 70% oxygen and even with this as she talks a pulse ox drops to 89%. Patient herself says she feels baseline. She has a planned road trip for Sunday, 08/25/2015 to [facility] in [place], [place] and an appointment with Dr. Sanda Klein at the transplant clinic on Monday, 08/26/2015. She is somewhat keen to get discharged and try to make that road trip. CT angiogram chest ruled out pulmonary embolism but showed diffuse interstitial lung disease similar to June 2016  Of note:   - 6 minute walk tst one month ago 07/25/2015: 689 feet without any breaks using 8 L oxygen and the lowest oxygen saturation was 87%. She was not hindered by dyspnea for a walk test   - Most recent 08/08/2015 and 08/15/2015:  baseline worst saturation was 75% on exertion with 15 L high flow dependent nasal cannula in pulmonary rehabilitation   VITAL SIGNS: Temp:  [96.8 F (36 C)-98.2 F (36.8 C)] 97.2 F (36.2 C) (10/14 0752) Pulse Rate:  [83-115] 98 (10/14 0752) Resp:  [22-36] 28 (10/14 0752) BP:  (117-145)/(70-95) 136/70 mmHg (10/14 0752) SpO2:  [81 %-97 %] 96 % (10/14 0752) FiO2 (%):  [70 %-97 %] 70 % (10/14 0752) Weight:  [84 kg (185 lb 3 oz)] 84 kg (185 lb 3 oz) (10/14 0500)   INTAKE / OUTPUT:  Intake/Output Summary (Last 24 hours) at 08/23/15 0940 Last data filed at 08/23/15 0900  Gross per 24 hour  Intake 791.25 ml  Output   1500 ml  Net -708.75 ml    PHYSICAL EXAMINATION: General:   Chronically ill appearing female, NAD  Neuro:  Awake, alert, appropriate, MAE  HEENT:  Mm dry, NRB, cushinoid face Cardiovascular:  s1s2 rrr Lungs:  resp even, mildly labored on NRB, bilateral dry crackles, diminished bases  Abdomen:  Round, soft, non tender, +bs  Musculoskeletal:  Warm and dry, no edema   LABS:  CBC  Recent Labs Lab 08/21/15 2018 08/22/15 0354  WBC 14.5* 13.0*  HGB 10.8* 10.0*  HCT 36.4 33.6*  PLT 364 309   Coag's No results for input(s): APTT, INR in the last 168 hours. BMET  Recent Labs Lab 08/21/15 2018 08/22/15 0354  NA 137 139  K 3.5 3.3*  CL 100* 102  CO2 27 29  BUN 9 9  CREATININE 0.98 0.88  GLUCOSE 223* 128*   Electrolytes  Recent Labs Lab 08/21/15 2018 08/22/15 0354  CALCIUM 8.9 8.4*   Sepsis Markers No results for input(s): LATICACIDVEN, PROCALCITON, O2SATVEN in the last 168 hours. ABG No results for input(s): PHART, PCO2ART, PO2ART in the  last 168 hours. Liver Enzymes  Recent Labs Lab 08/21/15 2018  AST 19  ALT 28  ALKPHOS 82  BILITOT 0.4  ALBUMIN 2.9*   Cardiac Enzymes No results for input(s): TROPONINI, PROBNP in the last 168 hours. Glucose  Recent Labs Lab 08/22/15 0218 08/22/15 0823 08/22/15 1206 08/22/15 1643 08/22/15 2130 08/23/15 0740  GLUCAP 136* 243* 265* 326* 289* 244*    Imaging Ct Angio Chest Pe W/cm &/or Wo Cm  08/22/2015  CLINICAL DATA:  Worsening shortness of breath. History of pulmonary fibrosis. EXAM: CT ANGIOGRAPHY CHEST WITH CONTRAST TECHNIQUE: Multidetector CT imaging of the chest  was performed using the standard protocol during bolus administration of intravenous contrast. Multiplanar CT image reconstructions and MIPs were obtained to evaluate the vascular anatomy. CONTRAST:  100 cc Omnipaque 350. COMPARISON:  CT of the chest 05/06/2015 FINDINGS: There is a small pericardial effusion. Thoracic aorta is normal in caliber. The main pulmonary artery is enlarged. There is no evidence of pulmonary embolus. The heart is enlarged. There is a small pericardial effusion. Atherosclerotic disease of the aorta is seen. Visualized thyroid is unremarkable. There is anterior mediastinal lymphadenopathy, and bilateral hilar lymphadenopathy. The largest lymph node in the left perivascular location measures 1.8 cm. No pathologically enlarged axillary lymph nodes are seen. Airways are patent. Evaluation of the lungs demonstrates widespread bilateral alveolar and airspace consolidation. Calcified right pleural plaque and irregular right pleural thickening is also seen. There is a particularly prominent area of nodular pleural and subpleural thickening in the anterior right upper lobe, with associated pleural calcifications ( image 45 axial images). Visualized upper abdomen is unremarkable. No evidence of acute osseous abnormality. Lower cervical spine fusion is noted. IMPRESSION: Enlarged heart with small pericardial effusion. Prominent main pulmonary artery, suggestive of pulmonary arterial hypertension. Atherosclerotic disease of the aorta. No evidence of pulmonary embolus. Stable anterior mediastinal lymphadenopathy. Severe widespread bilateral alveolar an airspace consolidation, similar to slightly worse than the prior study. This may represent chronic interstitial lung disease. Element of superimposed pulmonary edema cannot be excluded. Right pleural calcifications and irregular pleural thickening. Query prior trauma or asbestos exposure. Electronically Signed   By: Fidela Salisbury M.D.   On:  08/22/2015 18:09     ASSESSMENT / PLAN: Baseline # Obstructive sleep apnea on nocturnal CPAP #Idiopathic pulmonary fibrosis with chronic respiratory failure  - Most recent baseline using 10-15 liters of oxygen with exertion with lowest pulse ox in the 70s  - Likely steady decline at  since September 2016  #Currently   Acute on chronic hypoxic respiratory failure -    - Discussion: Given her diagnosis of IPF was made in the setting of an acute admission for respiratory distress. Therefore the setting of IPF flare back in spring 2016 she was started on steroids. Steroids are not warranted for IPF other than in the setting of the flare. By mid summer 2016 she was started on preventative therapy with Esbriet. However never been able to wean the steroids off. She's had several admissions since her original biopsy diagnosis. Many of this have been labeled due to IPF flare. I believe every time we try to wean steroids she gets more dyspneic and therefore she does not want to come off steroids. 1 or 2 of these admissions have happen even when we have tried to wean steroids. I remember one admission happening due to diastolic heart failure because of steroid related weight gain.    - IPF flare is a bad complication of IPF. She is  a lot more unusual than other patients because of multiple admissions for flare. Unclear if steroids are helping but she believes subjectively it does and does not want to come off steroids . Certainly she's had steroid related weight gain and diastolic heart failure and an admission for the same.    - Current admission came of the setting of fever so she could've had a viral infection or HCAP and all of this is difficult to prove. She might be having ongoing acute diastolic heart failure. This could also be IPF flare.     - At least at [facility] and many other transplant candidate centers she is not a transplant candidate due to her morbid obesity. We have tried  desperately to have her lose weight but with prednisone and high oxygen needs it has been a raise against time. Most recently a few days ago was started on an aggressive OPtiFast diet for dramatic weight loss but this admission has prevented implementation.   - As a  desperate measure we have an appointment with [facility] transplant team at [place], [place] on 08/26/2015 Monday for initial evaluation but in my clinical experience I highly doubt that given her morbid obesity she will even qualify for transplant and even if she did it would be a several week-few month process before she even becomes eligible   - In the interim her risk of death from idiopathic pulmnary fibrosis and progressive hypoxemic respiratory failure is extremely high. Natural history of this disease at this level of hypoxemia is in the order of several weeks on an average I   PLAN -  -bipap PRN and qhs  -Titrate O2 as able- accept sats >75% (he was 15 L oxygen, accept a lower pulse ox of greater than 75%) as long as she's feeling well about it  -Low dose PRN morphine for dyspnea - she states this also helps her tolerate bipap more easily  -Continue high dose IV steroids - 1gm/day in divided doses ordered per [facility] x 48h and then oral pred -HOLD pirfenidone  While inpatient atleast  -  she's had progressive disease despite being on this in the last few months - so doubt efficacy  - No proven evidence that it works and advance IPF anyways even though it is approved by FDA for this advanced disease and we started it out of desperation  - We'll consider switching her to  [facility] as an outpatient v versus reinitiating  prifenidone as outpatient  -Explained to her that road trip to [place] and current state runs a high risk of death and also lowered expectations that they might still not be able to offer her transplant which she already knows but she is still keen to get discharged tomorrow 08/24/15 to make that road trip  regardless of risk. She is willing to listen to our counsel though. Have emailed Dr Simeon Craft [facility] transplant team. Will update note once I hear from him  - Meanwhile,   - continue vanc, zosyn.   - Check Duplex LE and   - start aggressive lasix given previous experiene with acute diast dysfn. Get repeat ech0  - flonase to help with CPAP    - Contnue DNR/DNI but call palliative care to start goals discussion and symptom mgmt discussion (opiouids can be a barrier for transplnat) - she is acceptance of meeting with them but got very emotional as she sees this as end is coming near but admits to spiritual, emotional and physical distress and  accepts need for allevitation and aware pf poor prognosis  - Certainly if she returns to her baseline then she can get discharged next 1-2 days to make that risky road trip      The patient is critically ill with multiple organ systems failure and requires high complexity decision making for assessment and support, frequent evaluation and titration of therapies, application of advanced monitoring technologies and extensive interpretation of multiple databases.   Critical Care Time devoted to patient care services described in this note is  40  Minutes. This time reflects time of care of this signee Dr Brand Males. This critical care time does not reflect procedure time, or teaching time or supervisory time of PA/NP/Med student/Med Resident etc but could involve care discussion time    Dr. Brand Males, M.D., [facility].C.P Pulmonary and Critical Care Medicine Staff Physician [place] [facility] Pager: [phone], If no answer or between  15:00h - 7:00h: call [phone]  08/23/2015 10:11 AM

## 2015-08-23 NOTE — Progress Notes (Addendum)
08/23/2015 Central monitoring called at [facility] patient heart rate was 170's sustain. BP was 131/93, on bipap satting 93%. Fredirick Maudlin (NP) was called order was put in for 12 lead EKG. Did let him aware pt had a troponin 1 at 1655 and it was 0.07.  Nurse practitioner is aware of result of EKG and order was place to administer cardizem  Injection 10 mg. Shraddha RN on third shift was made aware. [facility] RN.

## 2015-08-24 ENCOUNTER — Other Ambulatory Visit (HOSPITAL_COMMUNITY): Payer: 59

## 2015-08-24 LAB — GLUCOSE, RANDOM: Glucose, Bld: 338 mg/dL — ABNORMAL HIGH (ref 65–99)

## 2015-08-24 LAB — COMPREHENSIVE METABOLIC PANEL
ALT: 26 U/L (ref 14–54)
ANION GAP: 13 (ref 5–15)
AST: 21 U/L (ref 15–41)
Albumin: 2.9 g/dL — ABNORMAL LOW (ref 3.5–5.0)
Alkaline Phosphatase: 75 U/L (ref 38–126)
BUN: 24 mg/dL — ABNORMAL HIGH (ref 6–20)
CHLORIDE: 94 mmol/L — AB (ref 101–111)
CO2: 31 mmol/L (ref 22–32)
Calcium: 8.8 mg/dL — ABNORMAL LOW (ref 8.9–10.3)
Creatinine, Ser: 1.07 mg/dL — ABNORMAL HIGH (ref 0.44–1.00)
GFR calc non Af Amer: 57 mL/min — ABNORMAL LOW (ref >60)
GLUCOSE: 344 mg/dL — AB (ref 65–99)
Potassium: 4.2 mmol/L (ref 3.5–5.1)
SODIUM: 138 mmol/L (ref 135–145)
Total Bilirubin: 0.6 mg/dL (ref 0.3–1.2)
Total Protein: 6.2 g/dL — ABNORMAL LOW (ref 6.5–8.1)

## 2015-08-24 LAB — BRAIN NATRIURETIC PEPTIDE: B NATRIURETIC PEPTIDE 5: 176.8 pg/mL — AB (ref 0.0–100.0)

## 2015-08-24 LAB — GLUCOSE, CAPILLARY
GLUCOSE-CAPILLARY: 317 mg/dL — AB (ref 65–99)
Glucose-Capillary: 300 mg/dL — ABNORMAL HIGH (ref 65–99)
Glucose-Capillary: 432 mg/dL — ABNORMAL HIGH (ref 65–99)
Glucose-Capillary: 363 mg/dL — ABNORMAL HIGH (ref 65–99)
Glucose-Capillary: 285 mg/dL — ABNORMAL HIGH (ref 65–99)

## 2015-08-24 LAB — PROCALCITONIN

## 2015-08-24 MED ORDER — INSULIN GLARGINE 100 UNIT/ML ~~LOC~~ SOLN
35.0000 [IU] | Freq: Once | SUBCUTANEOUS | Status: AC
  Administered 2015-08-24: 35 [IU] via SUBCUTANEOUS
  Filled 2015-08-24: qty 0.35

## 2015-08-24 MED ORDER — INSULIN ASPART 100 UNIT/ML ~~LOC~~ SOLN
10.0000 [IU] | Freq: Once | SUBCUTANEOUS | Status: AC
Start: 2015-08-24 — End: 2015-08-24
  Administered 2015-08-24: 10 [IU] via SUBCUTANEOUS

## 2015-08-24 MED ORDER — FUROSEMIDE 10 MG/ML IJ SOLN
INTRAMUSCULAR | Status: AC
  Filled 2015-08-24: qty 8

## 2015-08-24 MED ORDER — DILTIAZEM HCL ER COATED BEADS 240 MG PO CP24
240.0000 mg | ORAL_CAPSULE | Freq: Every day | ORAL | Status: DC
  Administered 2015-08-24 – 2015-08-27 (×4): 240 mg via ORAL
  Filled 2015-08-24 (×4): qty 1

## 2015-08-24 MED ORDER — INSULIN GLARGINE 100 UNIT/ML ~~LOC~~ SOLN
25.0000 [IU] | Freq: Two times a day (BID) | SUBCUTANEOUS | Status: DC
  Administered 2015-08-24: 25 [IU] via SUBCUTANEOUS
  Filled 2015-08-24 (×3): qty 0.25

## 2015-08-24 MED ORDER — INSULIN GLARGINE 100 UNIT/ML ~~LOC~~ SOLN
10.0000 [IU] | Freq: Once | SUBCUTANEOUS | Status: AC
  Administered 2015-08-24: 10 [IU] via SUBCUTANEOUS
  Filled 2015-08-24: qty 0.1

## 2015-08-24 MED ORDER — METHYLPREDNISOLONE SODIUM SUCC 40 MG IJ SOLR
40.0000 mg | Freq: Two times a day (BID) | INTRAMUSCULAR | Status: DC
  Administered 2015-08-24 – 2015-08-25 (×3): 40 mg via INTRAVENOUS
  Filled 2015-08-24 (×3): qty 1

## 2015-08-24 NOTE — Procedures (Signed)
Oxymizer removed from pt and pt placed on BiPAP.  Pt is comfortable and resting well.

## 2015-08-24 NOTE — Progress Notes (Signed)
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 Presented 10/12 with increased SOB, fever, increased cough with purulent sputum.  Admitted by [facility] with HCAP v IPF flare and started on IV abx, IV steroids.  She had minimal improvement and on 10/13 had increased dyspnea/WOB and hypoxia despite bipap and PCCM consulted.         SUBJECTIVE/OVERNIGHT/INTERVAL HX Better on bipap       VITAL SIGNS: Temp:  [97.7 F (36.5 C)-98.8 F (37.1 C)] 97.7 F (36.5 C) (10/15 0810) Pulse Rate:  [83-168] 102 (10/15 0810) Resp:  [24-37] 33 (10/15 0810) BP: (115-161)/(79-98) 137/82 mmHg (10/15 0936) SpO2:  [76 %-98 %] 95 % (10/15 0906) FiO2 (%):  [80 %] 80 % (10/15 0810) Weight:  [179 lb 7.3 oz (81.4 kg)] 179 lb 7.3 oz (81.4 kg) (10/15 0500)   INTAKE / OUTPUT:  Intake/Output Summary (Last 24 hours) at 08/24/15 1113 Last data filed at 08/24/15 1000  Gross per 24 hour  Intake   1428 ml  Output   5425 ml  Net  -3997 ml    PHYSICAL EXAMINATION: General:   Chronically ill appearing female, NAD  Neuro:  Awake, alert, appropriate, MAE  HEENT:  Mm dry, NRB, cushinoid face Cardiovascular:  s1s2 rrr Lungs:  resp even, mildly labored on NRB, bilateral dry crackles, diminished bases  Abdomen:  Round, soft, non tender, +bs  Musculoskeletal:  Warm and dry, no edema   LABS:  CBC  Recent Labs Lab 08/21/15 2018 08/22/15 0354 08/23/15 1655  WBC 14.5* 13.0* 21.6*  HGB 10.8* 10.0* 10.7*  HCT 36.4 33.6* 35.5*  PLT 364 309 382   Coag's No results for input(s): APTT, INR in the last 168 hours. BMET  Recent Labs Lab  08/22/15 0354 08/23/15 1655 08/24/15 0310  NA 139 136 138  K 3.3* 4.1 4.2  CL 102 97* 94*  CO2 29 29 31   BUN 9 19 24*  CREATININE 0.88 1.14* 1.07*  GLUCOSE 128* 391* 344*   Electrolytes  Recent Labs Lab 08/22/15 0354 08/23/15 1655 08/24/15 0310  CALCIUM 8.4* 8.7* 8.8*   Sepsis Markers  Recent Labs Lab 08/23/15 1317 08/24/15 0310  PROCALCITON <0.10 <0.10   ABG No results for input(s): PHART, PCO2ART, PO2ART in the last 168 hours. Liver Enzymes  Recent Labs Lab 08/21/15 2018 08/23/15 1655 08/24/15 0310  AST 19 27 21   ALT 28 28 26   ALKPHOS 82 82 75  BILITOT 0.4 0.7 0.6  ALBUMIN 2.9* 2.8* 2.9*   Cardiac Enzymes  Recent Labs Lab 08/23/15 1655  TROPONINI 0.07*   Glucose  Recent Labs Lab 08/23/15 0740 08/23/15 1240 08/23/15 1637 08/23/15 1711 08/23/15 2200 08/24/15 0813  GLUCAP 244* 253* 382* 397* 310* 317*      Lab Results  Component Value Date   ESRSEDRATE 57* 05/14/2015   ESRSEDRATE 80* 02/28/2015     ASSESSMENT / PLAN: Baseline # Obstructive sleep apnea on nocturnal CPAP #Idiopathic pulmonary fibrosis with chronic respiratory failure  - Most recent baseline using 10-15 liters of oxygen with exertion with lowest pulse ox in the 70s  -  Likely steady decline at  since September 2016  #Currently   Acute on chronic hypoxic respiratory failure -    - Discussion: Given her diagnosis of IPF was made in the setting of an acute admission for respiratory distress. Therefore the setting of IPF flare back in spring 2016 she was started on steroids. Steroids are not warranted for IPF other than in the setting of the flare. By mid summer 2016 she was started on preventative therapy with Esbriet. However never been able to wean the steroids off. She's had several admissions since her original biopsy diagnosis. Many of this have been labeled due to IPF flare. I believe every time we try to wean steroids she gets more dyspneic and therefore she does not  want to come off steroids. 1 or 2 of these admissions have happen even when we have tried to wean steroids. ?one admission happening due to diastolic heart failure because of steroid related weight gain.    - IPF flare is a bad complication of IPF. She is a lot more unusual than other patients because of multiple admissions for flare. Unclear if steroids are helping but she believes subjectively it does and does not want to come off steroids . Certainly she's had steroid related weight gain and diastolic heart failure and an admission for the same.    - Current admission came of the setting of fever so she could've had a viral infection or HCAP and all of this is difficult to prove. She might be having ongoing acute diastolic heart failure. This could also be IPF flare.     - At least at [facility] and many other transplant candidate centers she is not a transplant candidate due to her morbid obesity. We have tried desperately to have her lose weight but with prednisone and high oxygen needs it has been a raise against time. Most recently a few days ago was started on an aggressive OPtiFast diet for dramatic weight loss but this admission has prevented implementation.   - As a  desperate measure we have an appointment with [facility] transplant team at [place], [place] on 08/26/2015 > canceled as not candidate with bmi > 30   - In the interim her risk of death from idiopathic pulmnary fibrosis and progressive hypoxemic respiratory failure is extremely high. Natural history of this disease at this level of hypoxemia is in the order of several weeks on an average> palliative care to see         PLAN -  -bipap PRN and qhs  -Titrate O2 as able- accept sats >75% (he was 15 L oxygen, accept a lower pulse ox of greater than 75%) as long as she's feeling well about it  -Low dose PRN morphine for dyspnea - she states this also helps her tolerate bipap more easily  -Continue high dose IV  steroids - 1gm/day in divided doses ordered per [facility] x 48h and then oral pred -HOLD pirfenidone  While inpatient at least  -  she's had progressive disease despite being on this in the last few months - so doubt efficacy  - No proven evidence that it works and advance IPF anyways even though it is approved by FDA for this advanced disease and we started it out of desperation  - We'll consider switching her to  [facility] as an outpatient v versus reinitiating  prifenidone as outpatient     - Meanwhile,   - continue vanc, zosyn.   - Check Duplex LE  and   - start aggressive lasix given previous experiene with acute diast dysfn. Get repeat ech0  - flonase to help with CPAP    - Contnue DNR/DNI but call palliative care to start goals discussion and symptom mgmt discussion (opiouids can be a barrier for transplnat) -    Use of PPI is associated with improved survival time and with decreased radiologic fibrosis per King's study published in AJRCCM vol 184 p1390.  Dec 2011 and also may have other beneficial effects as per the latest review in [place] vol 193 H4193 Jun 20016.  This may not always be cause and effect, but given how universally unimpressive and expensive  all the other  Drugs developed to day  have been for pf,   rec   rx ppi / diet/ lifestyle modification and f/u with serial walking sats and lung volumes for now to put more points on the curve / establish firm baseline before considering additional measures.          Christinia Gully, MD Pulmonary and [place] [phone] After 5:30 PM or weekends, call [phone]

## 2015-08-24 NOTE — Progress Notes (Signed)
[facility] PROGRESS NOTE  EVAMAE ROWEN UJW:[id number] DOB: 1961/01/16 DOA: 08/21/2015 PCP: Velna Hatchet, MD  Length of stay: 3   Assessment/Plan: Principal Problem:   Acute on chronic respiratory failure ([place]) Active Problems:   DM2 (diabetes mellitus, type 2) ([place])   HTN (hypertension)   Diabetes mellitus type 2, controlled ([place])   Chronic anemia   Hypothyroidism   IPF (idiopathic pulmonary fibrosis) ([facility])   OSA (obstructive sleep apnea)   Acute and chronic respiratory failure (acute-on-chronic) ([place])   Palliative care encounter   SOB (shortness of breath)    1. Acute on chronic respiratory failure:  The patient has low-grade fever and worsening hypoxia consistent with IPF flare. Vs viral infection or HCAP , no evidence of PE, Venous duplex negative Appreciate pulmonary input, currently requiring 10-15 L of oxygen and oxygen saturation is in the 80s Continue vanc and zosyn,for broader coverage for HCAP -High-dose methylprednisolone for 2 days. Then switched to by mouth prednisone Pulmonology recommends to discontinue Esbriet Continue Lasix given history of acute diastolic heart failure Not a candidate for lung transplant secondary to increased PMI Continue BiPAP when necessary, daily at bedtime when necessary Acceptable oxygen saturation is greater than 75% High-dose Solu-Medrol discontinued given uncontrolled CBG and completion of 48 hours, Solu-Medrol decreased to 40 mg IV every 12  Goals of care-Palliative care consult-continue DO NOT RESUSCITATE, continue BiPAP for comfort, started on Roxanol 5 mg   Sinus tachycardia, likely secondary to hypoxemic respiratory failure,anxiety, repeat EKG , negative cardiac enzymes 2, patient endorses no chest pain, repeat 2-D echo pending. Started on Cardizem by mouth long-acting given need for IV Cardizem pushes overnight   2. NIDDM:  Uncontrolled secondary to IV steroids Increase Lantus on 10/14, reduce  Solu-Medrol IV and hopefully that should improve CBG Discontinue metformin -Sliding scale corrections  3. HTN:  Stable. DC  Lisinopril as pt will receive IV contrast with CT   4. Hypothyroidism:  Stable. -Continue home levothyroxine -TSH is ordered  5. Chronic normocytic anemia:  Stable.   6. Depression and anxiety:  Stable.  -Continue home duloxetine and alprazolam and topiramate   DVT prophylaxsis Lovenox  Code Status:      Code Status Orders        Start     Ordered   08/22/15 0121  Do not attempt resuscitation (DNR)   Continuous    Question Answer Comment  In the event of cardiac or respiratory ARREST Do not call a "code blue"   In the event of cardiac or respiratory ARREST Do not perform Intubation, CPR, defibrillation or ACLS   In the event of cardiac or respiratory ARREST Use medication by any route, position, wound care, and other measures to relive pain and suffering. May use oxygen, suction and manual treatment of airway obstruction as needed for comfort.      08/22/15 0121     Family Communication: family updated about patient's clinical progress Disposition Plan:  Cont stepdown ,      Brief narrative: Joann [name] is a 54 y.o. female with a past medical history significant for IPF on 8 L home oxygen, referred to lung transplant center at [facility], IDDM, HTN, anemia, hypothyroidism, and OSA who presents with low-grade fever and increasing oxygen demands.  The patient was in her usual state of health until the last few days when she has felt increasing shortness of breath and requiring additional oxygen at night. She is also noted low-grade fevers and  an increase in sputum during this time. Today she needed to turn her oxygen to 10 L and saw that she was desaturating with any exertion.  In the ED, the patient was hypoxic with exertion. She had an elevated WBC. She had a low-grade fever. Her BNP was normal. Her chest x-ray was unchanged. She was admitted  to [facility] for IPF flare versus HCAP.  Consultants:  Pulmonary  Procedures:  None  Antibiotics: Anti-infectives    Start     Dose/Rate Route Frequency Ordered Stop   08/23/15 1330  vancomycin (VANCOCIN) IVPB 1000 mg/200 mL premix     1,000 mg 200 mL/hr over 60 Minutes Intravenous Every 12 hours 08/23/15 1326     08/22/15 2300  levofloxacin (LEVAQUIN) IVPB 750 mg  Status:  Discontinued     750 mg 100 mL/hr over 90 Minutes Intravenous Every 24 hours 08/22/15 0121 08/22/15 0826   08/22/15 1030  vancomycin (VANCOCIN) 1,500 mg in sodium chloride 0.9 % 500 mL IVPB     1,500 mg 250 mL/hr over 120 Minutes Intravenous  Once 08/22/15 1011 08/22/15 1230   08/22/15 1000  piperacillin-tazobactam (ZOSYN) IVPB 3.375 g     3.375 g 12.5 mL/hr over 240 Minutes Intravenous 3 times per day 08/22/15 0839     08/21/15 2300  levofloxacin (LEVAQUIN) IVPB 750 mg     750 mg 100 mL/hr over 90 Minutes Intravenous  Once 08/21/15 2252 08/22/15 0045         HPI/Subjective: On BiPAP for breakfast this morning, oxygen saturation 82, and maximum flow oxygen   Objective: Filed Vitals:   08/24/15 0600 08/24/15 0810 08/24/15 0906 08/24/15 0936  BP: 127/79 137/82  137/82  Pulse: 83 102    Temp:  97.7 F (36.5 C)    TempSrc:  Oral    Resp:  33    Height:      Weight:      SpO2: 97% 97% 95%     Intake/Output Summary (Last 24 hours) at 08/24/15 1130 Last data filed at 08/24/15 1100  Gross per 24 hour  Intake 1440.5 ml  Output   5425 ml  Net -3984.5 ml    Exam:  General: No acute respiratory distress Lungs: Clear to auscultation bilaterally without wheezes or crackles Cardiovascular: Regular rate and rhythm without murmur gallop or rub normal S1 and S2 Abdomen: Nontender, nondistended, soft, bowel sounds positive, no rebound, no ascites, no appreciable mass Extremities: No significant cyanosis, clubbing, or edema bilateral lower extremities     Data Review   Micro Results Recent Results  (from the past 240 hour(s))  MRSA PCR Screening     Status: None   Collection Time: 08/22/15  1:23 AM  Result Value Ref Range Status   MRSA by PCR NEGATIVE NEGATIVE Final    Comment:        The GeneXpert MRSA Assay (FDA approved for NASAL specimens only), is one component of a comprehensive MRSA colonization surveillance program. It is not intended to diagnose MRSA infection nor to guide or monitor treatment for MRSA infections.     Radiology Reports Dg Chest 2 View  08/21/2015  CLINICAL DATA:  Pulmonary fibrosis.  Increasing shortness of breath EXAM: CHEST  2 VIEW COMPARISON:  August 05, 2015 FINDINGS: Widespread pulmonary fibrosis is present bilaterally, stable. There is no new opacities to suggest superimposed edema or consolidation. Heart is upper normal in size with pulmonary vascularity within normal limits. No adenopathy. There is postoperative change in the lower cervical  spine. IMPRESSION: Widespread interstitial fibrosis, stable. No new opacity apparent. No change in cardiac silhouette. It must be cautioned that subtle superimposed pneumonia could easily be obscured by this degree of extensive fibrosis. Electronically Signed   By: Lowella Grip III M.D.   On: 08/21/2015 21:16   Dg Chest 2 View  07/26/2015  CLINICAL DATA:  Chronic pulmonary fibrosis. Chronic shortness of breath. EXAM: CHEST  2 VIEW COMPARISON:  05/18/2015 FINDINGS: The heart is enlarged but stable. Prominent mediastinal and hilar contours are unchanged. Chronic pulmonary changes of pulmonary fibrosis. No definite acute overlying pulmonary process. No pleural effusion. Bilateral cervical ribs are noted. IMPRESSION: Chronic changes of pulmonary fibrosis. No acute overlying pulmonary findings. Electronically Signed   By: Marijo Sanes M.D.   On: 07/26/2015 15:18   Ct Angio Chest Pe W/cm &/or Wo Cm  08/22/2015  CLINICAL DATA:  Worsening shortness of breath. History of pulmonary fibrosis. EXAM: CT ANGIOGRAPHY  CHEST WITH CONTRAST TECHNIQUE: Multidetector CT imaging of the chest was performed using the standard protocol during bolus administration of intravenous contrast. Multiplanar CT image reconstructions and MIPs were obtained to evaluate the vascular anatomy. CONTRAST:  100 cc Omnipaque 350. COMPARISON:  CT of the chest 05/06/2015 FINDINGS: There is a small pericardial effusion. Thoracic aorta is normal in caliber. The main pulmonary artery is enlarged. There is no evidence of pulmonary embolus. The heart is enlarged. There is a small pericardial effusion. Atherosclerotic disease of the aorta is seen. Visualized thyroid is unremarkable. There is anterior mediastinal lymphadenopathy, and bilateral hilar lymphadenopathy. The largest lymph node in the left perivascular location measures 1.8 cm. No pathologically enlarged axillary lymph nodes are seen. Airways are patent. Evaluation of the lungs demonstrates widespread bilateral alveolar and airspace consolidation. Calcified right pleural plaque and irregular right pleural thickening is also seen. There is a particularly prominent area of nodular pleural and subpleural thickening in the anterior right upper lobe, with associated pleural calcifications ( image 45 axial images). Visualized upper abdomen is unremarkable. No evidence of acute osseous abnormality. Lower cervical spine fusion is noted. IMPRESSION: Enlarged heart with small pericardial effusion. Prominent main pulmonary artery, suggestive of pulmonary arterial hypertension. Atherosclerotic disease of the aorta. No evidence of pulmonary embolus. Stable anterior mediastinal lymphadenopathy. Severe widespread bilateral alveolar an airspace consolidation, similar to slightly worse than the prior study. This may represent chronic interstitial lung disease. Element of superimposed pulmonary edema cannot be excluded. Right pleural calcifications and irregular pleural thickening. Query prior trauma or asbestos exposure.  Electronically Signed   By: Fidela Salisbury M.D.   On: 08/22/2015 18:09     CBC  Recent Labs Lab 08/21/15 2018 08/22/15 0354 08/23/15 1655  WBC 14.5* 13.0* 21.6*  HGB 10.8* 10.0* 10.7*  HCT 36.4 33.6* 35.5*  PLT 364 309 382  MCV 84.3 84.2 83.3  MCH 25.0* 25.1* 25.1*  MCHC 29.7* 29.8* 30.1  RDW 18.0* 18.1* 18.0*    Chemistries   Recent Labs Lab 08/21/15 2018 08/22/15 0354 08/23/15 1655 08/24/15 0310  NA 137 139 136 138  K 3.5 3.3* 4.1 4.2  CL 100* 102 97* 94*  CO2 27 29 29 31   GLUCOSE 223* 128* 391* 344*  BUN 9 9 19  24*  CREATININE 0.98 0.88 1.14* 1.07*  CALCIUM 8.9 8.4* 8.7* 8.8*  AST 19  --  27 21  ALT 28  --  28 26  ALKPHOS 82  --  82 75  BILITOT 0.4  --  0.7 0.6   ------------------------------------------------------------------------------------------------------------------ estimated  creatinine clearance is 56.2 mL/min (by C-G formula based on Cr of 1.07). ------------------------------------------------------------------------------------------------------------------ No results for input(s): HGBA1C in the last 72 hours. ------------------------------------------------------------------------------------------------------------------ No results for input(s): CHOL, HDL, LDLCALC, TRIG, CHOLHDL, LDLDIRECT in the last 72 hours. ------------------------------------------------------------------------------------------------------------------  Recent Labs  08/22/15 0354  TSH 0.691   ------------------------------------------------------------------------------------------------------------------ No results for input(s): VITAMINB12, FOLATE, FERRITIN, TIBC, IRON, RETICCTPCT in the last 72 hours.  Coagulation profile No results for input(s): INR, PROTIME in the last 168 hours.   Recent Labs  08/22/15 1312  DDIMER 1.42*    Cardiac Enzymes  Recent Labs Lab 08/23/15 1655  TROPONINI 0.07*    ------------------------------------------------------------------------------------------------------------------ Invalid input(s): POCBNP   CBG:  Recent Labs Lab 08/23/15 1240 08/23/15 1637 08/23/15 1711 08/23/15 2200 08/24/15 0813  GLUCAP 253* 382* 397* 310* 317*       Studies: Ct Angio Chest Pe W/cm &/or Wo Cm  08/22/2015  CLINICAL DATA:  Worsening shortness of breath. History of pulmonary fibrosis. EXAM: CT ANGIOGRAPHY CHEST WITH CONTRAST TECHNIQUE: Multidetector CT imaging of the chest was performed using the standard protocol during bolus administration of intravenous contrast. Multiplanar CT image reconstructions and MIPs were obtained to evaluate the vascular anatomy. CONTRAST:  100 cc Omnipaque 350. COMPARISON:  CT of the chest 05/06/2015 FINDINGS: There is a small pericardial effusion. Thoracic aorta is normal in caliber. The main pulmonary artery is enlarged. There is no evidence of pulmonary embolus. The heart is enlarged. There is a small pericardial effusion. Atherosclerotic disease of the aorta is seen. Visualized thyroid is unremarkable. There is anterior mediastinal lymphadenopathy, and bilateral hilar lymphadenopathy. The largest lymph node in the left perivascular location measures 1.8 cm. No pathologically enlarged axillary lymph nodes are seen. Airways are patent. Evaluation of the lungs demonstrates widespread bilateral alveolar and airspace consolidation. Calcified right pleural plaque and irregular right pleural thickening is also seen. There is a particularly prominent area of nodular pleural and subpleural thickening in the anterior right upper lobe, with associated pleural calcifications ( image 45 axial images). Visualized upper abdomen is unremarkable. No evidence of acute osseous abnormality. Lower cervical spine fusion is noted. IMPRESSION: Enlarged heart with small pericardial effusion. Prominent main pulmonary artery, suggestive of pulmonary arterial  hypertension. Atherosclerotic disease of the aorta. No evidence of pulmonary embolus. Stable anterior mediastinal lymphadenopathy. Severe widespread bilateral alveolar an airspace consolidation, similar to slightly worse than the prior study. This may represent chronic interstitial lung disease. Element of superimposed pulmonary edema cannot be excluded. Right pleural calcifications and irregular pleural thickening. Query prior trauma or asbestos exposure. Electronically Signed   By: Fidela Salisbury M.D.   On: 08/22/2015 18:09      Lab Results  Component Value Date   HGBA1C 8.6* 02/27/2015   Lab Results  Component Value Date   CREATININE 1.07* 08/24/2015       Scheduled Meds: . dicyclomine  20 mg Oral 4 times per day  . diltiazem  240 mg Oral Daily  . DULoxetine  60 mg Oral q morning - 10a  . enoxaparin (LOVENOX) injection  40 mg Subcutaneous Q24H  . fluticasone  2 spray Each Nare Daily  . furosemide  60 mg Intravenous 3 times per day  . insulin aspart  0-15 Units Subcutaneous TID WC  . insulin aspart  0-5 Units Subcutaneous QHS  . insulin glargine  25 Units Subcutaneous BID  . levothyroxine  200 mcg Oral QAC breakfast  . methylPREDNISolone (SOLU-MEDROL) injection  40 mg Intravenous Q12H  . pantoprazole  40 mg  Oral Daily  . piperacillin-tazobactam (ZOSYN)  IV  3.375 g Intravenous 3 times per day  . potassium chloride SA  20 mEq Oral Daily  . sodium chloride  3 mL Intravenous Q12H  . topiramate  25 mg Oral BID  . traZODone  150 mg Oral QHS  . vancomycin  1,000 mg Intravenous Q12H   Continuous Infusions:   Principal Problem:   Acute on chronic respiratory failure ([facility]) Active Problems:   DM2 (diabetes mellitus, type 2) ([facility])   HTN (hypertension)   Diabetes mellitus type 2, controlled ([facility])   Chronic anemia   Hypothyroidism   IPF (idiopathic pulmonary fibrosis) ([facility])   OSA (obstructive sleep apnea)   Acute and chronic respiratory failure (acute-on-chronic) ([place])    Palliative care encounter   SOB (shortness of breath)    Time spent: 45 minutes   [facility] Pager [phone]. If 7PM-7AM, please contact night-coverage at [email or web address], password [facility] 08/24/2015, 11:30 AM  LOS: 3 days

## 2015-08-24 NOTE — Consult Note (Deleted)
PULMONARY / CRITICAL CARE MEDICINE   Name: Joann [name] MRN: [id number] DOB: 09/04/1961    ADMISSION DATE:  08/21/2015 CONSULTATION DATE:  10/13  REFERRING MD :  Allyson Sabal ([facility])   CHIEF COMPLAINT:  Acute on chronic respiratory failure  INITIAL PRESENTATION: 54yo obese white  female with hx severe IPF (bx proven 03/2015) on 8L home O2, OSA, DM and HTN awaiting lung transplant w/u at [facility] (scheduled to go there 10/16). Presented 10/12 with increased SOB, fever, increased cough with purulent sputum.  Admitted by [facility] with HCAP v IPF flare and started on IV abx, IV steroids.  She had minimal improvement and on 10/13 had increased dyspnea/WOB and hypoxia despite bipap and PCCM consulted.         SUBJECTIVE/OVERNIGHT/INTERVAL HX Better on bipap       VITAL SIGNS: Temp:  [97.7 F (36.5 C)-98.8 F (37.1 C)] 97.7 F (36.5 C) (10/15 0810) Pulse Rate:  [83-168] 102 (10/15 0810) Resp:  [24-37] 33 (10/15 0810) BP: (115-161)/(79-98) 137/82 mmHg (10/15 0936) SpO2:  [76 %-98 %] 95 % (10/15 0906) FiO2 (%):  [80 %] 80 % (10/15 0810) Weight:  [179 lb 7.3 oz (81.4 kg)] 179 lb 7.3 oz (81.4 kg) (10/15 0500)   INTAKE / OUTPUT:  Intake/Output Summary (Last 24 hours) at 08/24/15 1106 Last data filed at 08/24/15 1000  Gross per 24 hour  Intake   1428 ml  Output   5425 ml  Net  -3997 ml    PHYSICAL EXAMINATION: General:   Chronically ill appearing female, NAD  Neuro:  Awake, alert, appropriate, MAE  HEENT:  Mm dry, NRB, cushinoid face Cardiovascular:  s1s2 rrr Lungs:  resp even, mildly labored on NRB, bilateral dry crackles, diminished bases  Abdomen:  Round, soft, non tender, +bs  Musculoskeletal:  Warm and dry, no edema   LABS:  CBC  Recent Labs Lab 08/21/15 2018 08/22/15 0354 08/23/15 1655  WBC 14.5* 13.0* 21.6*  HGB 10.8* 10.0* 10.7*  HCT 36.4 33.6* 35.5*  PLT 364 309 382   Coag's No results for input(s): APTT, INR in the last 168 hours. BMET  Recent Labs Lab  08/22/15 0354 08/23/15 1655 08/24/15 0310  NA 139 136 138  K 3.3* 4.1 4.2  CL 102 97* 94*  CO2 29 29 31   BUN 9 19 24*  CREATININE 0.88 1.14* 1.07*  GLUCOSE 128* 391* 344*   Electrolytes  Recent Labs Lab 08/22/15 0354 08/23/15 1655 08/24/15 0310  CALCIUM 8.4* 8.7* 8.8*   Sepsis Markers  Recent Labs Lab 08/23/15 1317 08/24/15 0310  PROCALCITON <0.10 <0.10   ABG No results for input(s): PHART, PCO2ART, PO2ART in the last 168 hours. Liver Enzymes  Recent Labs Lab 08/21/15 2018 08/23/15 1655 08/24/15 0310  AST 19 27 21   ALT 28 28 26   ALKPHOS 82 82 75  BILITOT 0.4 0.7 0.6  ALBUMIN 2.9* 2.8* 2.9*   Cardiac Enzymes  Recent Labs Lab 08/23/15 1655  TROPONINI 0.07*   Glucose  Recent Labs Lab 08/23/15 0740 08/23/15 1240 08/23/15 1637 08/23/15 1711 08/23/15 2200 08/24/15 0813  GLUCAP 244* 253* 382* 397* 310* 317*      Lab Results  Component Value Date   ESRSEDRATE 57* 05/14/2015   ESRSEDRATE 80* 02/28/2015     ASSESSMENT / PLAN: Baseline # Obstructive sleep apnea on nocturnal CPAP #Idiopathic pulmonary fibrosis with chronic respiratory failure  - Most recent baseline using 10-15 liters of oxygen with exertion with lowest pulse ox in the 70s  -  Likely steady decline at  since September 2016  #Currently   Acute on chronic hypoxic respiratory failure -    - Discussion: Given her diagnosis of IPF was made in the setting of an acute admission for respiratory distress. Therefore the setting of IPF flare back in spring 2016 she was started on steroids. Steroids are not warranted for IPF other than in the setting of the flare. By mid summer 2016 she was started on preventative therapy with Esbriet. However never been able to wean the steroids off. She's had several admissions since her original biopsy diagnosis. Many of this have been labeled due to IPF flare. I believe every time we try to wean steroids she gets more dyspneic and therefore she does not  want to come off steroids. 1 or 2 of these admissions have happen even when we have tried to wean steroids. ?one admission happening due to diastolic heart failure because of steroid related weight gain.    - IPF flare is a bad complication of IPF. She is a lot more unusual than other patients because of multiple admissions for flare. Unclear if steroids are helping but she believes subjectively it does and does not want to come off steroids . Certainly she's had steroid related weight gain and diastolic heart failure and an admission for the same.    - Current admission came of the setting of fever so she could've had a viral infection or HCAP and all of this is difficult to prove. She might be having ongoing acute diastolic heart failure. This could also be IPF flare.     - At least at [facility] and many other transplant candidate centers she is not a transplant candidate due to her morbid obesity. We have tried desperately to have her lose weight but with prednisone and high oxygen needs it has been a raise against time. Most recently a few days ago was started on an aggressive OPtiFast diet for dramatic weight loss but this admission has prevented implementation.   - As a  desperate measure we have an appointment with [facility] transplant team at [place], [place] on 08/26/2015 > canceled as not candidate with bmi > 30   - In the interim her risk of death from idiopathic pulmnary fibrosis and progressive hypoxemic respiratory failure is extremely high. Natural history of this disease at this level of hypoxemia is in the order of several weeks on an average> palliative care to see         PLAN -  -bipap PRN and qhs  -Titrate O2 as able- accept sats >75% (he was 15 L oxygen, accept a lower pulse ox of greater than 75%) as long as she's feeling well about it  -Low dose PRN morphine for dyspnea - she states this also helps her tolerate bipap more easily  -Continue high dose IV  steroids - 1gm/day in divided doses ordered per [facility] x 48h and then oral pred -HOLD pirfenidone  While inpatient at least  -  she's had progressive disease despite being on this in the last few months - so doubt efficacy  - No proven evidence that it works and advance IPF anyways even though it is approved by FDA for this advanced disease and we started it out of desperation  - We'll consider switching her to  [facility] as an outpatient v versus reinitiating  prifenidone as outpatient     - Meanwhile,   - continue vanc, zosyn.   - Check Duplex LE  and   - start aggressive lasix given previous experiene with acute diast dysfn. Get repeat ech0  - flonase to help with CPAP    - Contnue DNR/DNI but call palliative care to start goals discussion and symptom mgmt discussion (opiouids can be a barrier for transplnat) -    Use of PPI is associated with improved survival time and with decreased radiologic fibrosis per King's study published in AJRCCM vol 184 p1390.  Dec 2011 and also may have other beneficial effects as per the latest review in [place] vol 193 H3716 Jun 20016.  This may not always be cause and effect, but given how universally unimpressive and expensive  all the other  Drugs developed to day  have been for pf,   rec   rx ppi / diet/ lifestyle modification and f/u with serial walking sats and lung volumes for now to put more points on the curve / establish firm baseline before considering additional measures.          Christinia Gully, MD Pulmonary and [place] [phone] After 5:30 PM or weekends, call [phone]

## 2015-08-24 NOTE — Progress Notes (Addendum)
cardizem 10 mg IV given once @2013  by Agapito Games RN. HR 110 @ 2030.  Will cont to monitor pt HR.

## 2015-08-24 NOTE — Progress Notes (Signed)
Pt taken off Bipap to eat dinner.  RN aware.  RT will continue to monitor.

## 2015-08-25 ENCOUNTER — Inpatient Hospital Stay (HOSPITAL_COMMUNITY): Payer: 59

## 2015-08-25 DIAGNOSIS — R0600 Dyspnea, unspecified: Secondary | ICD-10-CM

## 2015-08-25 LAB — BASIC METABOLIC PANEL
ANION GAP: 14 (ref 5–15)
BUN: 35 mg/dL — ABNORMAL HIGH (ref 6–20)
CALCIUM: 8.4 mg/dL — AB (ref 8.9–10.3)
CO2: 35 mmol/L — AB (ref 22–32)
Chloride: 87 mmol/L — ABNORMAL LOW (ref 101–111)
Creatinine, Ser: 1.15 mg/dL — ABNORMAL HIGH (ref 0.44–1.00)
GFR, EST NON AFRICAN AMERICAN: 53 mL/min — AB (ref >60)
GLUCOSE: 348 mg/dL — AB (ref 65–99)
POTASSIUM: 4 mmol/L (ref 3.5–5.1)
Sodium: 136 mmol/L (ref 135–145)

## 2015-08-25 LAB — CBC
HEMATOCRIT: 36.9 % (ref 36.0–46.0)
Hemoglobin: 11.1 g/dL — ABNORMAL LOW (ref 12.0–15.0)
MCH: 24.9 pg — ABNORMAL LOW (ref 26.0–34.0)
MCHC: 30.1 g/dL (ref 30.0–36.0)
MCV: 82.9 fL (ref 78.0–100.0)
PLATELETS: 354 10*3/uL (ref 150–400)
RBC: 4.45 MIL/uL (ref 3.87–5.11)
RDW: 17.8 % — AB (ref 11.5–15.5)
WBC: 15.4 10*3/uL — AB (ref 4.0–10.5)

## 2015-08-25 LAB — PROCALCITONIN

## 2015-08-25 LAB — GLUCOSE, CAPILLARY
GLUCOSE-CAPILLARY: 376 mg/dL — AB (ref 65–99)
GLUCOSE-CAPILLARY: 296 mg/dL — AB (ref 65–99)
Glucose-Capillary: 303 mg/dL — ABNORMAL HIGH (ref 65–99)
Glucose-Capillary: 317 mg/dL — ABNORMAL HIGH (ref 65–99)
Glucose-Capillary: 437 mg/dL — ABNORMAL HIGH (ref 65–99)
Glucose-Capillary: 440 mg/dL — ABNORMAL HIGH (ref 65–99)

## 2015-08-25 MED ORDER — INSULIN GLARGINE 100 UNIT/ML ~~LOC~~ SOLN
10.0000 [IU] | Freq: Once | SUBCUTANEOUS | Status: AC
  Administered 2015-08-25: 10 [IU] via SUBCUTANEOUS
  Filled 2015-08-25: qty 0.1

## 2015-08-25 MED ORDER — INSULIN ASPART 100 UNIT/ML ~~LOC~~ SOLN
8.0000 [IU] | Freq: Once | SUBCUTANEOUS | Status: AC
  Administered 2015-08-25: 8 [IU] via SUBCUTANEOUS

## 2015-08-25 MED ORDER — INSULIN GLARGINE 100 UNIT/ML ~~LOC~~ SOLN
30.0000 [IU] | Freq: Two times a day (BID) | SUBCUTANEOUS | Status: DC
  Administered 2015-08-25 – 2015-08-27 (×5): 30 [IU] via SUBCUTANEOUS
  Filled 2015-08-25 (×7): qty 0.3

## 2015-08-25 MED ORDER — PREDNISONE 10 MG PO TABS
60.0000 mg | ORAL_TABLET | Freq: Every day | ORAL | Status: DC
  Administered 2015-08-25 – 2015-08-27 (×3): 60 mg via ORAL
  Filled 2015-08-25 (×6): qty 1

## 2015-08-25 MED ORDER — PANTOPRAZOLE SODIUM 40 MG PO TBEC
40.0000 mg | DELAYED_RELEASE_TABLET | Freq: Two times a day (BID) | ORAL | Status: DC
  Administered 2015-08-25 – 2015-08-27 (×5): 40 mg via ORAL
  Filled 2015-08-25 (×5): qty 1

## 2015-08-25 MED ORDER — INSULIN ASPART 100 UNIT/ML ~~LOC~~ SOLN
10.0000 [IU] | Freq: Once | SUBCUTANEOUS | Status: AC
  Administered 2015-08-25: 10 [IU] via SUBCUTANEOUS

## 2015-08-25 NOTE — Progress Notes (Signed)
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  Admitted by [facility] with HCAP v IPF flare and started on IV abx, IV steroids.  She had minimal improvement and on 10/13 had increased dyspnea/WOB and hypoxia despite bipap and PCCM consulted.      STUDIES; Venous dopplers 08/23/15 > - No evidence of deep vein thrombosis involving the right lower extremity and left lower extremity.        SUBJECTIVE/OVERNIGHT/INTERVAL HX Better on bipap overnight/ sitting up eating bfast on np this am       VITAL SIGNS: Temp:  [97.3 F (36.3 C)-98.3 F (36.8 C)] 97.3 F (36.3 C) (10/16 0800) Pulse Rate:  [78-106] 92 (10/16 0828) Resp:  [26-30] 30 (10/16 0828) BP: (117-135)/(74-89) 119/76 mmHg (10/16 0828) SpO2:  [85 %-99 %] 93 % (10/16 0828) FiO2 (%):  [80 %] 80 % (10/15 1246) Weight:  [182 lb 5.1 oz (82.7 kg)] 182 lb 5.1 oz (82.7 kg) (10/16 0252)   INTAKE / OUTPUT:  Intake/Output Summary (Last 24 hours) at 08/25/15 0939 Last data filed at 08/25/15 0447  Gross per 24 hour  Intake    793 ml  Output   4450 ml  Net  -3657 ml    PHYSICAL EXAMINATION: General:   Chronically ill appearing female, NAD  Neuro:  Awake, alert, appropriate, MAE  HEENT:  Mm dry, NRB, cushinoid face Cardiovascular:  s1s2 rrr Lungs:  resp even, mildly labored on NRB, bilateral dry isnp crackles, diminished bases  Abdomen:  Round, soft, non tender, +bs  Musculoskeletal:  Warm and dry, no edema   LABS:  CBC  Recent Labs Lab 08/22/15 0354 08/23/15 1655 08/25/15 0255  WBC 13.0* 21.6* 15.4*  HGB 10.0* 10.7* 11.1*  HCT 33.6*  35.5* 36.9  PLT 309 382 354   Coag's No results for input(s): APTT, INR in the last 168 hours. BMET  Recent Labs Lab 08/23/15 1655 08/24/15 0310 08/24/15 1849 08/25/15 0255  NA 136 138  --  136  K 4.1 4.2  --  4.0  CL 97* 94*  --  87*  CO2 29 31  --  35*  BUN 19 24*  --  35*  CREATININE 1.14* 1.07*  --  1.15*  GLUCOSE 391* 344* 338* 348*   Electrolytes  Recent Labs Lab 08/23/15 1655 08/24/15 0310 08/25/15 0255  CALCIUM 8.7* 8.8* 8.4*   Sepsis Markers  Recent Labs Lab 08/23/15 1317 08/24/15 0310 08/25/15 0255  PROCALCITON <0.10 <0.10 <0.10   ABG No results for input(s): PHART, PCO2ART, PO2ART in the last 168 hours. Liver Enzymes  Recent Labs Lab 08/21/15 2018 08/23/15 1655 08/24/15 0310  AST 19 27 21   ALT 28 28 26   ALKPHOS 82 82 75  BILITOT 0.4 0.7 0.6  ALBUMIN 2.9* 2.8* 2.9*   Cardiac Enzymes  Recent Labs Lab 08/23/15 1655  TROPONINI 0.07*   Glucose  Recent Labs Lab 08/24/15 1608 08/24/15 2000 08/24/15 2234 08/25/15 0010 08/25/15 0449 08/25/15 0831  GLUCAP 432* 363* 285* 303* 376* 296*      Lab Results  Component Value Date   ESRSEDRATE  57* 05/14/2015   ESRSEDRATE 80* 02/28/2015   BNP 177  08/24/15    ASSESSMENT / PLAN: Baseline # Obstructive sleep apnea on nocturnal CPAP #Idiopathic pulmonary fibrosis with chronic respiratory failure  - Most recent baseline using 10-15 liters of oxygen with exertion with lowest pulse ox in the 70s  - Likely steady decline at  since September 2016     Acute on chronic hypoxic respiratory failure -    - Discussion: Given her diagnosis of IPF was made in the setting of an acute admission for respiratory distress. Therefore the setting of IPF flare back in spring 2016 she was started on steroids. Steroids are not warranted for IPF other than in the setting of the flare. By mid summer 2016 she was started on preventative therapy with Esbriet. However never been able to wean the steroids off.  She's had several admissions since her original biopsy diagnosis. Many of this have been labeled due to IPF flare. I believe every time we try to wean steroids she gets more dyspneic and therefore she does not want to come off steroids. 1 or 2 of these admissions have happen even when we have tried to wean steroids. ?one admission happening due to diastolic heart failure because of steroid related weight gain.    - IPF flare is a bad complication of IPF. She is a lot more unusual than other patients because of multiple admissions for flare. Unclear if steroids are helping but she believes subjectively it does and does not want to come off steroids . Certainly she's had steroid related weight gain and diastolic heart failure and an admission for the same.    - Current admission came of the setting of fever so she could've had a viral infection or HCAP and all of this is difficult to prove. She might be having ongoing acute diastolic heart failure. This could also be IPF flare.     - At least at [facility] and many other transplant candidate centers she is not a transplant candidate due to her morbid obesity. We have tried desperately to have her lose weight but with prednisone and high oxygen needs it has been a raise against time. Most recently a few days ago was started on an aggressive OPtiFast diet for dramatic weight loss but this admission has prevented implementation.   - As a  desperate measure we have an appointment with [facility] transplant team at [place], [place] on 08/26/2015 > canceled as not candidate with bmi > 30   - In the interim her risk of death from idiopathic pulmnary fibrosis and progressive hypoxemic respiratory failure is extremely high. Natural history of this disease at this level of hypoxemia is in the order of several weeks on an average> palliative care to see         PLAN -  -bipap PRN and qhs  -Titrate O2 as able- accept sats >75% (he was 15 L oxygen,  accept a lower pulse ox of greater than 75%) as long as she's feeling well about it  -Low dose PRN morphine for dyspnea - she states this also helps her tolerate bipap more easily  -Continue high dose IV steroids - 1gm/day in divided doses ordered per [facility] x 48h and then oral pred -HOLD pirfenidone  While inpatient at least  -  she's had progressive disease despite being on this in the last few months - so doubt efficacy  - No proven evidence that it works and advance IPF anyways  even though it is approved by FDA for this advanced disease and we started it out of desperation  - We'll consider switching her to  [facility] as an outpatient v versus reinitiating  prifenidone as outpatient     - Meanwhile,   - continue vanc, zosyn.      - start aggressive lasix given previous experiene with acute diast dysfn. Check  repeat echo pending from 10/14   - flonase to help with CPAP    - Contnue DNR/DNI but call palliative care to start goals discussion and symptom mgmt discussion   Use of PPI is associated with improved survival time and with decreased radiologic fibrosis per King's study published in AJRCCM vol 184 p1390.  Dec 2011 and also may have other beneficial effects as per the latest review in [place] vol 193 O1308 Jun 20016.  This may not always be cause and effect, but given how  unimpressive and expensive  all the other  Drugs tried to date  For her   rec   rx max ppi / diet/ lifestyle modification  To extent possible       Christinia Gully, MD Pulmonary and [place] [phone] After 5:30 PM or weekends, call [phone]

## 2015-08-25 NOTE — Progress Notes (Signed)
ANTIBIOTIC CONSULT NOTE - INITIAL  Pharmacy Consult for vancomycin, zosyn  Indication: pneumonia  Allergies  Allergen Reactions  . Other Anaphylaxis    WALNUTS  . Peanut-Containing Drug Products Shortness Of Breath    Pecans, walnuts as well  . Peanuts [Peanut Oil] Shortness Of Breath  . Pecan Pollen Shortness Of Breath  . Sulfa Antibiotics Hives and Shortness Of Breath  . Betadine [Povidone Iodine] Rash  . Latex Rash    Patient Measurements: Height: 5' (152.4 cm) Weight: 182 lb 5.1 oz (82.7 kg) IBW/kg (Calculated) : 45.5 Adjusted Body Weight:   Vital Signs: Temp: 97.3 F (36.3 C) (10/16 1200) Temp Source: Oral (10/16 1200) BP: 106/94 mmHg (10/16 1200) Pulse Rate: 99 (10/16 1200) Intake/Output from previous day: 10/15 0701 - 10/16 0700 In: 793 [P.O.:240; I.V.:3; IV Piggyback:550] Out: 4450 [Urine:4450] Intake/Output from this shift:    Labs:  Recent Labs  08/23/15 1655 08/24/15 0310 08/25/15 0255  WBC 21.6*  --  15.4*  HGB 10.7*  --  11.1*  PLT 382  --  354  CREATININE 1.14* 1.07* 1.15*   Estimated Creatinine Clearance: 52.7 mL/min (by C-G formula based on Cr of 1.15). No results for input(s): VANCOTROUGH, VANCOPEAK, VANCORANDOM, GENTTROUGH, GENTPEAK, GENTRANDOM, TOBRATROUGH, TOBRAPEAK, TOBRARND, AMIKACINPEAK, AMIKACINTROU, AMIKACIN in the last 72 hours.   Microbiology: Recent Results (from the past 720 hour(s))  MRSA PCR Screening     Status: None   Collection Time: 08/22/15  1:23 AM  Result Value Ref Range Status   MRSA by PCR NEGATIVE NEGATIVE Final    Comment:        The GeneXpert MRSA Assay (FDA approved for NASAL specimens only), is one component of a comprehensive MRSA colonization surveillance program. It is not intended to diagnose MRSA infection nor to guide or monitor treatment for MRSA infections.     Medical History: Past Medical History  Diagnosis Date  . Asthma   . Diabetes mellitus   . Headache(784.0)     MIGRAINES  .  Fibromyalgia   . GERD (gastroesophageal reflux disease)   . IBS (irritable bowel syndrome)   . Renal mass, right   . IC (interstitial cystitis)   . Frequency of urination   . Anemia   . Swelling of both lower extremities     TAKES MAXIDE FOR SWELLING (DENIES HBP)  . Hypothyroidism   . Depression   . Cancer ([place])     kidney  . Hypertension   . Wears glasses   . Sleep apnea     uses a cpap  . Pulmonary fibrosis ([place])   . Pneumonia   . Fatty liver   . Oxygen dependent     2 Liters at rest, 3 liters with activity    Medications:  Prescriptions prior to admission  Medication Sig Dispense Refill Last Dose  . ALPRAZolam (XANAX) 0.5 MG tablet Take 0.5 mg by mouth daily as needed for anxiety.    08/21/2015 at Unknown time  . cholecalciferol (VITAMIN D) 1000 UNITS tablet Take 1,000 Units by mouth every morning.    08/21/2015 at Unknown time  . dicyclomine (BENTYL) 20 MG tablet Take 20 mg by mouth every 6 (six) hours.   08/21/2015 at Unknown time  . DULoxetine (CYMBALTA) 60 MG capsule Take 60 mg by mouth every morning.    08/21/2015 at Unknown time  . esomeprazole (NEXIUM) 40 MG capsule Take 40 mg by mouth 2 (two) times daily before a meal.    08/21/2015 at Unknown time  .  estrogens, conjugated, (PREMARIN) 0.625 MG tablet Take 0.625 mg by mouth every morning.    08/21/2015 at Unknown time  . guaiFENesin (MUCINEX) 600 MG 12 hr tablet Take 1 tablet (600 mg total) by mouth 2 (two) times daily. (Patient taking differently: Take 600 mg by mouth 2 (two) times daily as needed for cough or to loosen phlegm. )   08/21/2015 at Unknown time  . insulin aspart (NOVOLOG) 100 UNIT/ML injection Inject 0-9 Units into the skin 3 (three) times daily with meals. (Patient taking differently: Inject 15 Units into the skin 3 (three) times daily with meals. Can adjust dose up or down according to meal size.) 10 mL 11 08/21/2015 at Unknown time  . insulin glargine (LANTUS) 100 UNIT/ML injection Inject 0.45 mLs (45  Units total) into the skin at bedtime. 10 mL 11 08/20/2015 at Unknown time  . levothyroxine (SYNTHROID, LEVOTHROID) 200 MCG tablet Take 200 mcg by mouth daily before breakfast.   08/21/2015 at Unknown time  . lisinopril (PRINIVIL,ZESTRIL) 20 MG tablet Take 20 mg by mouth daily.   08/21/2015 at Unknown time  . metFORMIN (GLUCOPHAGE-XR) 500 MG 24 hr tablet Take 2 tablets by mouth 2 (two) times daily.  7 08/21/2015 at Unknown time  . Multiple Vitamins-Minerals (MULTIVITAMIN GUMMIES WOMENS PO) Take by mouth daily.   08/21/2015 at Unknown time  . Pirfenidone (ESBRIET) 267 MG CAPS 3 caps TID (Patient taking differently: Take 3 capsules by mouth 3 (three) times daily. 3 caps TID) 270 capsule 5 08/21/2015 at Unknown time  . potassium chloride SA (K-DUR,KLOR-CON) 20 MEQ tablet Take 20 mEq by mouth daily.   08/21/2015 at Unknown time  . predniSONE (DELTASONE) 20 MG tablet Take 2 tablets (40 mg total) by mouth daily with breakfast. 60 tablet 3 08/21/2015 at Unknown time  . topiramate (TOPAMAX) 25 MG tablet Take 25 mg by mouth 2 (two) times daily.   08/21/2015 at Unknown time  . traZODone (DESYREL) 150 MG tablet Take 150 mg by mouth at bedtime.    08/20/2015 at Unknown time  . vitamin E 400 UNIT capsule Take 400 Units by mouth 2 (two) times daily.    08/21/2015 at Unknown time  . albuterol (PROVENTIL) (2.5 MG/3ML) 0.083% nebulizer solution Take 3 mLs (2.5 mg total) by nebulization every 2 (two) hours as needed for wheezing. 75 mL 12 unknown  . ipratropium-albuterol (DUONEB) 0.5-2.5 (3) MG/3ML SOLN Take 3 mLs by nebulization every 6 (six) hours. 360 mL  unknown  . meclizine (ANTIVERT) 25 MG tablet Take 25 mg by mouth 3 (three) times daily as needed for dizziness.   unknown  . ondansetron (ZOFRAN) 4 MG tablet Take 4 mg by mouth every 8 (eight) hours as needed for nausea or vomiting.   unknown  . oxyCODONE (OXY IR/ROXICODONE) 5 MG immediate release tablet Take 1-2 tablets (5-10 mg total) by mouth every 4 (four)  hours as needed for severe pain. 30 tablet 0 unknown   Assessment: 54 yo female with IPF admitted with increasing O2 demand and SOB. On day #4 Vanc/Zosyn. WBC 21 > 15, SCr 1.1, eCrCl 50-55 ml/min.    Goal of Therapy:  Vancomycin trough level 15-20 mcg/ml   Plan:  -Vancomycin 1g/12h -Zosyn 3.375 g IV q8h -Monitor cx, renal fx, duration of therapy -Will obtain vancomycin trough tomorrow   Hughes Better, PharmD, BCPS Clinical Pharmacist Pager: [phone] 08/25/2015 1:06 PM

## 2015-08-25 NOTE — Progress Notes (Signed)
RT called to pt room due to increased WOB and decreased sat. Pt asked to go back on Bipap to rest.  RT will continue to monitor.

## 2015-08-25 NOTE — Progress Notes (Signed)
  Echocardiogram 2D Echocardiogram has been performed.  Joann [name] 08/25/2015, 9:55 AM

## 2015-08-25 NOTE — Progress Notes (Signed)
Daily Progress Note   Patient Name: Joann [name]       Date: 08/25/2015 DOB: 02/03/1961  Age: 54 y.o. MRN#: [id number] Attending Physician: Reyne Dumas, MD Primary Care Physician: Velna Hatchet, MD Admit Date: 08/21/2015  Reason for Consultation/Follow-up: Establishing goals of care  Subjective: Pt continues to be very dyspneic at rest, alternating using oximyzer and BIPAP. Becomes tearful when talking about leaving her family. Recognizes that she is not a lung transplant candidate and that she cannot make it  To [facility] in  [place] even if she was. Wants to go home to see her dogs, and die at home  Interval Events: Worsening dyspnea Length of Stay: 4 days  Current Medications: Scheduled Meds:  . dicyclomine  20 mg Oral 4 times per day  . diltiazem  240 mg Oral Daily  . DULoxetine  60 mg Oral q morning - 10a  . enoxaparin (LOVENOX) injection  40 mg Subcutaneous Q24H  . fluticasone  2 spray Each Nare Daily  . furosemide  60 mg Intravenous 3 times per day  . insulin aspart  0-15 Units Subcutaneous TID WC  . insulin aspart  0-5 Units Subcutaneous QHS  . insulin glargine  30 Units Subcutaneous BID  . levothyroxine  200 mcg Oral QAC breakfast  . pantoprazole  40 mg Oral BID AC  . piperacillin-tazobactam (ZOSYN)  IV  3.375 g Intravenous 3 times per day  . potassium chloride SA  20 mEq Oral Daily  . predniSONE  60 mg Oral Q breakfast  . sodium chloride  3 mL Intravenous Q12H  . topiramate  25 mg Oral BID  . traZODone  150 mg Oral QHS  . vancomycin  1,000 mg Intravenous Q12H    Continuous Infusions:    PRN Meds: ALPRAZolam, guaiFENesin, morphine injection  Palliative Performance Scale: 30%     Vital Signs: BP 106/94 mmHg  Pulse 98  Temp(Src) 97.3 F (36.3 C) (Oral)  Resp 36  Ht 5' (1.524 m)  Wt 82.7 kg (182 lb 5.1 oz)  BMI 35.61 kg/m2  SpO2 93% SpO2: SpO2: 93 % O2 Device: O2 Device: Bi-PAP O2 Flow Rate: O2 Flow Rate (L/min): 10 L/min  Intake/output  summary:  Intake/Output Summary (Last 24 hours) at 08/25/15 1535 Last data filed at 08/25/15 1200  Gross per 24 hour  Intake    300 ml  Output   4750 ml  Net  -4450 ml   LBM:   Baseline Weight: Weight: 82.555 kg (182 lb) Most recent weight: Weight: 82.7 kg (182 lb 5.1 oz)  Physical Exam: Gen: Well nourished female. Tearful, short of breath at rest Resp: On 15 Liters. 3-5 word dyspnea Cardiac: Tachy              Additional Data Reviewed: Recent Labs     08/23/15  1655  08/24/15  0310  08/25/15  0255  WBC  21.6*   --   15.4*  HGB  10.7*   --   11.1*  PLT  382   --   354  NA  136  138  136  BUN  19  24*  35*  CREATININE  1.14*  1.07*  1.15*     Problem List:  Patient Active Problem List   Diagnosis Date Noted  . Palliative care encounter   . SOB (shortness of breath)   . Acute and chronic respiratory failure (acute-on-chronic) ([place]) 08/21/2015  . Acute on chronic respiratory failure ([place]) 05/14/2015  . Healthcare-associated  pneumonia   . HCAP (healthcare-associated pneumonia) 05/05/2015  . Chronic respiratory failure with hypoxia ([place]) 05/02/2015  . Dizziness 05/02/2015  . Right-sided chest pain 05/02/2015  . Acute on chronic respiratory failure with hypoxia ([place]) 04/24/2015  . Pneumothorax 04/09/2015  . Pneumonitis   . OSA (obstructive sleep apnea)   . Asthma, chronic   . IPF (idiopathic pulmonary fibrosis) ([place])   . Respiratory failure ([place])   . Increased oxygen demand   . Shortness of breath   . Hypoxia 03/28/2015  . Diabetes mellitus type 2, controlled ([place]) 03/28/2015  . Chronic anemia 03/28/2015  . Hypertension 03/28/2015  . Hypothyroidism 03/28/2015  . Interstitial lung disease ([place]) 03/21/2015  . DM2 (diabetes mellitus, type 2) ([place]) 02/28/2015  . HTN (hypertension) 02/28/2015  . ILD (interstitial lung disease) ([place]) 02/28/2015  . Renal neoplasm 12/14/2013     Palliative Care Assessment & Plan    Code Status:  DNR  Goals of Care:  Home  with hospice. Care management order placed  Wants to go home on BIPAP, oximyzer. She reports she has oximyzer at home but would need to be seen by RT for BIPAP. Pt has a CPAP at home but per pt settings are inadequate. Also her current concentrator only goes to 10 Liters  Discussed role of opioids generally in mgt of dyspnea. Husband is a recovering addict with 57 years sobriety and son is in active heroin addiction and she is concerned about this class of drug but it has been helpful in the hospital  Symptom Management:  Dyspnea; Cont BIPAP PRN for now. This is important to pt at this point to provide some quality time with her family as well as rescue. Cont oximyzer which she has at home  Palliative Prophylaxis:  Bowel regimen  Psycho-social/Spiritual:  Desire for further Chaplaincy support:no   Prognosis: < 6 months Discharge Planning: Home with Hospice    Thank you for allowing the Palliative Medicine Team to assist in the care of this patient.   Time In: 1030 Time Out: 1100 Total Time 30 min Prolonged Time Billed  no     Greater than 50%  of this time was spent counseling and coordinating care related to the above assessment and plan.   Dory Horn, NP  08/25/2015, 3:35 PM  Please contact Palliative Medicine Team phone at [phone] for questions and concerns.

## 2015-08-25 NOTE — Progress Notes (Signed)
[facility] PROGRESS NOTE  Joann [name] LZJ:[id number] DOB: 05/28/1961 DOA: 08/21/2015 PCP: Velna Hatchet, MD  Length of stay: 4   Assessment/Plan: Principal Problem:   Acute on chronic respiratory failure ([place]) Active Problems:   DM2 (diabetes mellitus, type 2) ([place])   HTN (hypertension)   Diabetes mellitus type 2, controlled ([place])   Chronic anemia   Hypothyroidism   IPF (idiopathic pulmonary fibrosis) ([facility])   OSA (obstructive sleep apnea)   Acute and chronic respiratory failure (acute-on-chronic) ([place])   Palliative care encounter   SOB (shortness of breath)    1. Acute on chronic respiratory failure:  The patient has low-grade fever and worsening hypoxia consistent with IPF flare. Vs viral infection or HCAP , no evidence of PE, Bilateral Venous duplex negative Appreciate pulmonary input, currently requiring 10-15 L of oxygen and oxygen saturation is in the 80s, BiPAP at night Continue vanc and zosyn,for broader coverage for HCAP-de-escalate antibiotics when okay with pulmonary Discontinue Solu-Medrol and switched to by mouth prednisone 60 mg a day Pulmonology recommends to discontinue Esbriet Continue Lasix given history of acute diastolic heart failure, renal function stable, 2-D echo per pulmonary recommendations Not a candidate for lung transplant secondary to increased PMI Continue BiPAP when necessary, daily at bedtime when necessary Acceptable oxygen saturation is greater than 75%    Goals of care-Palliative care consult-continue DO NOT RESUSCITATE, continue BiPAP for comfort, started on Roxanol 5 mg   Sinus tachycardia, likely secondary to hypoxemic respiratory failure,anxiety, repeat EKG , negative cardiac enzymes 2, patient endorses no chest pain, repeat 2-D echo pending. Started on Cardizem by mouth long-acting given need for IV Cardizem pushes overnight   2. NIDDM:  Uncontrolled secondary to IV steroids Increase Lantus to 30 units twice a  day Discontinue metformin -Sliding scale corrections  3. HTN:  Stable. DC  Lisinopril as pt received IV contrast with CT /Lasix  4. Hypothyroidism:  Stable. -Continue home levothyroxine -TSH is ordered  5. Chronic normocytic anemia:  Stable. Leukocytosis likely secondary to steroids  6. Depression and anxiety:  Stable.  -Continue home duloxetine and alprazolam and topiramate   DVT prophylaxsis Lovenox  Code Status:      Code Status Orders        Start     Ordered   08/22/15 0121  Do not attempt resuscitation (DNR)   Continuous    Question Answer Comment  In the event of cardiac or respiratory ARREST Do not call a "code blue"   In the event of cardiac or respiratory ARREST Do not perform Intubation, CPR, defibrillation or ACLS   In the event of cardiac or respiratory ARREST Use medication by any route, position, wound care, and other measures to relive pain and suffering. May use oxygen, suction and manual treatment of airway obstruction as needed for comfort.      08/22/15 0121     Family Communication: family updated about patient's clinical progress Disposition Plan:  Cont stepdown ,      Brief narrative: Joann [name] is a 54 y.o. female with a past medical history significant for IPF on 8 L home oxygen, referred to lung transplant center at [facility], IDDM, HTN, anemia, hypothyroidism, and OSA who presents with low-grade fever and increasing oxygen demands.  The patient was in her usual state of health until the last few days when she has felt increasing shortness of breath and requiring additional oxygen at night. She is also noted low-grade fevers and an  increase in sputum during this time. Today she needed to turn her oxygen to 10 L and saw that she was desaturating with any exertion.  In the ED, the patient was hypoxic with exertion. She had an elevated WBC. She had a low-grade fever. Her BNP was normal. Her chest x-ray was unchanged. She was admitted to [facility]  for IPF flare versus HCAP.  Consultants:  Pulmonary  Procedures:  None  Antibiotics: Anti-infectives    Start     Dose/Rate Route Frequency Ordered Stop   08/23/15 1330  vancomycin (VANCOCIN) IVPB 1000 mg/200 mL premix     1,000 mg 200 mL/hr over 60 Minutes Intravenous Every 12 hours 08/23/15 1326     08/22/15 2300  levofloxacin (LEVAQUIN) IVPB 750 mg  Status:  Discontinued     750 mg 100 mL/hr over 90 Minutes Intravenous Every 24 hours 08/22/15 0121 08/22/15 0826   08/22/15 1030  vancomycin (VANCOCIN) 1,500 mg in sodium chloride 0.9 % 500 mL IVPB     1,500 mg 250 mL/hr over 120 Minutes Intravenous  Once 08/22/15 1011 08/22/15 1230   08/22/15 1000  piperacillin-tazobactam (ZOSYN) IVPB 3.375 g     3.375 g 12.5 mL/hr over 240 Minutes Intravenous 3 times per day 08/22/15 0839     08/21/15 2300  levofloxacin (LEVAQUIN) IVPB 750 mg     750 mg 100 mL/hr over 90 Minutes Intravenous  Once 08/21/15 2252 08/22/15 0045         HPI/Subjective: On BiPAP this morning, feels a little bit better, less short of breath   Objective: Filed Vitals:   08/25/15 0252 08/25/15 0447 08/25/15 0800 08/25/15 0828  BP:  121/74 119/76 119/76  Pulse:  78 86 92  Temp:  98 F (36.7 C) 97.3 F (36.3 C)   TempSrc:  Axillary Oral   Resp:    30  Height:      Weight: 82.7 kg (182 lb 5.1 oz)     SpO2:  96% 90% 93%    Intake/Output Summary (Last 24 hours) at 08/25/15 3710 Last data filed at 08/25/15 0447  Gross per 24 hour  Intake    793 ml  Output   4450 ml  Net  -3657 ml    Exam:  General: No acute respiratory distress Lungs: Clear to auscultation bilaterally without wheezes or crackles Cardiovascular: Regular rate and rhythm without murmur gallop or rub normal S1 and S2 Abdomen: Nontender, nondistended, soft, bowel sounds positive, no rebound, no ascites, no appreciable mass Extremities: No significant cyanosis, clubbing, or edema bilateral lower extremities     Data Review    Micro Results Recent Results (from the past 240 hour(s))  MRSA PCR Screening     Status: None   Collection Time: 08/22/15  1:23 AM  Result Value Ref Range Status   MRSA by PCR NEGATIVE NEGATIVE Final    Comment:        The GeneXpert MRSA Assay (FDA approved for NASAL specimens only), is one component of a comprehensive MRSA colonization surveillance program. It is not intended to diagnose MRSA infection nor to guide or monitor treatment for MRSA infections.     Radiology Reports Dg Chest 2 View  08/21/2015  CLINICAL DATA:  Pulmonary fibrosis.  Increasing shortness of breath EXAM: CHEST  2 VIEW COMPARISON:  August 05, 2015 FINDINGS: Widespread pulmonary fibrosis is present bilaterally, stable. There is no new opacities to suggest superimposed edema or consolidation. Heart is upper normal in size with pulmonary vascularity within normal  limits. No adenopathy. There is postoperative change in the lower cervical spine. IMPRESSION: Widespread interstitial fibrosis, stable. No new opacity apparent. No change in cardiac silhouette. It must be cautioned that subtle superimposed pneumonia could easily be obscured by this degree of extensive fibrosis. Electronically Signed   By: Lowella Grip III M.D.   On: 08/21/2015 21:16   Dg Chest 2 View  07/26/2015  CLINICAL DATA:  Chronic pulmonary fibrosis. Chronic shortness of breath. EXAM: CHEST  2 VIEW COMPARISON:  05/18/2015 FINDINGS: The heart is enlarged but stable. Prominent mediastinal and hilar contours are unchanged. Chronic pulmonary changes of pulmonary fibrosis. No definite acute overlying pulmonary process. No pleural effusion. Bilateral cervical ribs are noted. IMPRESSION: Chronic changes of pulmonary fibrosis. No acute overlying pulmonary findings. Electronically Signed   By: Marijo Sanes M.D.   On: 07/26/2015 15:18   Ct Angio Chest Pe W/cm &/or Wo Cm  08/22/2015  CLINICAL DATA:  Worsening shortness of breath. History of  pulmonary fibrosis. EXAM: CT ANGIOGRAPHY CHEST WITH CONTRAST TECHNIQUE: Multidetector CT imaging of the chest was performed using the standard protocol during bolus administration of intravenous contrast. Multiplanar CT image reconstructions and MIPs were obtained to evaluate the vascular anatomy. CONTRAST:  100 cc Omnipaque 350. COMPARISON:  CT of the chest 05/06/2015 FINDINGS: There is a small pericardial effusion. Thoracic aorta is normal in caliber. The main pulmonary artery is enlarged. There is no evidence of pulmonary embolus. The heart is enlarged. There is a small pericardial effusion. Atherosclerotic disease of the aorta is seen. Visualized thyroid is unremarkable. There is anterior mediastinal lymphadenopathy, and bilateral hilar lymphadenopathy. The largest lymph node in the left perivascular location measures 1.8 cm. No pathologically enlarged axillary lymph nodes are seen. Airways are patent. Evaluation of the lungs demonstrates widespread bilateral alveolar and airspace consolidation. Calcified right pleural plaque and irregular right pleural thickening is also seen. There is a particularly prominent area of nodular pleural and subpleural thickening in the anterior right upper lobe, with associated pleural calcifications ( image 45 axial images). Visualized upper abdomen is unremarkable. No evidence of acute osseous abnormality. Lower cervical spine fusion is noted. IMPRESSION: Enlarged heart with small pericardial effusion. Prominent main pulmonary artery, suggestive of pulmonary arterial hypertension. Atherosclerotic disease of the aorta. No evidence of pulmonary embolus. Stable anterior mediastinal lymphadenopathy. Severe widespread bilateral alveolar an airspace consolidation, similar to slightly worse than the prior study. This may represent chronic interstitial lung disease. Element of superimposed pulmonary edema cannot be excluded. Right pleural calcifications and irregular pleural thickening.  Query prior trauma or asbestos exposure. Electronically Signed   By: Fidela Salisbury M.D.   On: 08/22/2015 18:09     CBC  Recent Labs Lab 08/21/15 2018 08/22/15 0354 08/23/15 1655 08/25/15 0255  WBC 14.5* 13.0* 21.6* 15.4*  HGB 10.8* 10.0* 10.7* 11.1*  HCT 36.4 33.6* 35.5* 36.9  PLT 364 309 382 354  MCV 84.3 84.2 83.3 82.9  MCH 25.0* 25.1* 25.1* 24.9*  MCHC 29.7* 29.8* 30.1 30.1  RDW 18.0* 18.1* 18.0* 17.8*    Chemistries   Recent Labs Lab 08/21/15 2018 08/22/15 0354 08/23/15 1655 08/24/15 0310 08/24/15 1849 08/25/15 0255  NA 137 139 136 138  --  136  K 3.5 3.3* 4.1 4.2  --  4.0  CL 100* 102 97* 94*  --  87*  CO2 27 29 29 31   --  35*  GLUCOSE 223* 128* 391* 344* 338* 348*  BUN 9 9 19  24*  --  35*  CREATININE 0.98 0.88 1.14* 1.07*  --  1.15*  CALCIUM 8.9 8.4* 8.7* 8.8*  --  8.4*  AST 19  --  27 21  --   --   ALT 28  --  28 26  --   --   ALKPHOS 82  --  82 75  --   --   BILITOT 0.4  --  0.7 0.6  --   --    ------------------------------------------------------------------------------------------------------------------ estimated creatinine clearance is 52.7 mL/min (by C-G formula based on Cr of 1.15). ------------------------------------------------------------------------------------------------------------------ No results for input(s): HGBA1C in the last 72 hours. ------------------------------------------------------------------------------------------------------------------ No results for input(s): CHOL, HDL, LDLCALC, TRIG, CHOLHDL, LDLDIRECT in the last 72 hours. ------------------------------------------------------------------------------------------------------------------ No results for input(s): TSH, T4TOTAL, T3FREE, THYROIDAB in the last 72 hours.  Invalid input(s): FREET3 ------------------------------------------------------------------------------------------------------------------ No results for input(s): VITAMINB12, FOLATE, FERRITIN, TIBC,  IRON, RETICCTPCT in the last 72 hours.  Coagulation profile No results for input(s): INR, PROTIME in the last 168 hours.   Recent Labs  08/22/15 1312  DDIMER 1.42*    Cardiac Enzymes  Recent Labs Lab 08/23/15 1655  TROPONINI 0.07*   ------------------------------------------------------------------------------------------------------------------ Invalid input(s): POCBNP   CBG:  Recent Labs Lab 08/24/15 2000 08/24/15 2234 08/25/15 0010 08/25/15 0449 08/25/15 0831  GLUCAP 363* 285* 303* 376* 296*       Studies: No results found.    Lab Results  Component Value Date   HGBA1C 8.6* 02/27/2015   Lab Results  Component Value Date   CREATININE 1.15* 08/25/2015       Scheduled Meds: . dicyclomine  20 mg Oral 4 times per day  . diltiazem  240 mg Oral Daily  . DULoxetine  60 mg Oral q morning - 10a  . enoxaparin (LOVENOX) injection  40 mg Subcutaneous Q24H  . fluticasone  2 spray Each Nare Daily  . furosemide  60 mg Intravenous 3 times per day  . insulin aspart  0-15 Units Subcutaneous TID WC  . insulin aspart  0-5 Units Subcutaneous QHS  . insulin glargine  30 Units Subcutaneous BID  . levothyroxine  200 mcg Oral QAC breakfast  . pantoprazole  40 mg Oral Daily  . piperacillin-tazobactam (ZOSYN)  IV  3.375 g Intravenous 3 times per day  . potassium chloride SA  20 mEq Oral Daily  . predniSONE  60 mg Oral Q breakfast  . sodium chloride  3 mL Intravenous Q12H  . topiramate  25 mg Oral BID  . traZODone  150 mg Oral QHS  . vancomycin  1,000 mg Intravenous Q12H   Continuous Infusions:   Principal Problem:   Acute on chronic respiratory failure ([facility]) Active Problems:   DM2 (diabetes mellitus, type 2) ([facility])   HTN (hypertension)   Diabetes mellitus type 2, controlled ([facility])   Chronic anemia   Hypothyroidism   IPF (idiopathic pulmonary fibrosis) ([facility])   OSA (obstructive sleep apnea)   Acute and chronic respiratory failure (acute-on-chronic) ([place])    Palliative care encounter   SOB (shortness of breath)    Time spent: 45 minutes   [facility] Pager [phone]. If 7PM-7AM, please contact night-coverage at [email or web address], password [facility] 08/25/2015, 9:38 AM  LOS: 4 days

## 2015-08-25 NOTE — Progress Notes (Signed)
Utilization review completed.  

## 2015-08-25 NOTE — Progress Notes (Signed)
Rt called to pt room to take off Bipap so she could eat dinner.  RN notified, RT will continue to monitor.

## 2015-08-26 ENCOUNTER — Telehealth: Payer: Self-pay | Admitting: Internal Medicine

## 2015-08-26 LAB — COMPREHENSIVE METABOLIC PANEL
ALBUMIN: 3.3 g/dL — AB (ref 3.5–5.0)
ALT: 26 U/L (ref 14–54)
AST: 16 U/L (ref 15–41)
Alkaline Phosphatase: 79 U/L (ref 38–126)
Anion gap: 14 (ref 5–15)
BUN: 34 mg/dL — AB (ref 6–20)
CHLORIDE: 86 mmol/L — AB (ref 101–111)
CO2: 37 mmol/L — AB (ref 22–32)
CREATININE: 1.38 mg/dL — AB (ref 0.44–1.00)
Calcium: 8.4 mg/dL — ABNORMAL LOW (ref 8.9–10.3)
GFR calc Af Amer: 49 mL/min — ABNORMAL LOW (ref >60)
GFR, EST NON AFRICAN AMERICAN: 42 mL/min — AB (ref >60)
GLUCOSE: 328 mg/dL — AB (ref 65–99)
POTASSIUM: 4.4 mmol/L (ref 3.5–5.1)
SODIUM: 137 mmol/L (ref 135–145)
Total Bilirubin: 0.7 mg/dL (ref 0.3–1.2)
Total Protein: 7.1 g/dL (ref 6.5–8.1)

## 2015-08-26 LAB — CBC
HEMATOCRIT: 40.1 % (ref 36.0–46.0)
Hemoglobin: 12.4 g/dL (ref 12.0–15.0)
MCH: 25.5 pg — AB (ref 26.0–34.0)
MCHC: 30.9 g/dL (ref 30.0–36.0)
MCV: 82.3 fL (ref 78.0–100.0)
PLATELETS: 393 10*3/uL (ref 150–400)
RBC: 4.87 MIL/uL (ref 3.87–5.11)
RDW: 17.6 % — AB (ref 11.5–15.5)
WBC: 14.7 10*3/uL — ABNORMAL HIGH (ref 4.0–10.5)

## 2015-08-26 LAB — GLUCOSE, CAPILLARY
GLUCOSE-CAPILLARY: 448 mg/dL — AB (ref 65–99)
Glucose-Capillary: 311 mg/dL — ABNORMAL HIGH (ref 65–99)
Glucose-Capillary: 231 mg/dL — ABNORMAL HIGH (ref 65–99)
Glucose-Capillary: 305 mg/dL — ABNORMAL HIGH (ref 65–99)
Glucose-Capillary: 412 mg/dL — ABNORMAL HIGH (ref 65–99)

## 2015-08-26 LAB — VANCOMYCIN, TROUGH: VANCOMYCIN TR: 23 ug/mL — AB (ref 10.0–20.0)

## 2015-08-26 MED ORDER — INSULIN ASPART 100 UNIT/ML ~~LOC~~ SOLN
10.0000 [IU] | Freq: Once | SUBCUTANEOUS | Status: AC
  Administered 2015-08-26: 10 [IU] via SUBCUTANEOUS

## 2015-08-26 MED ORDER — FUROSEMIDE 40 MG PO TABS: 40.0000 mg | ORAL_TABLET | Freq: Two times a day (BID) | ORAL | Status: AC

## 2015-08-26 MED ORDER — METOPROLOL TARTRATE 25 MG PO TABS: 6.2500 mg | ORAL_TABLET | Freq: Two times a day (BID) | ORAL | Status: AC

## 2015-08-26 MED ORDER — METOPROLOL TARTRATE 12.5 MG HALF TABLET: 6.2500 mg | ORAL_TABLET | Freq: Two times a day (BID) | ORAL | Status: DC

## 2015-08-26 MED ORDER — METOPROLOL TARTRATE 12.5 MG HALF TABLET: 6.2500 mg | ORAL_TABLET | Freq: Every day | ORAL | Status: DC

## 2015-08-26 MED ORDER — OXYCODONE HCL 5 MG PO TABS: 5.0000 mg | ORAL_TABLET | ORAL | Status: AC | PRN

## 2015-08-26 MED ORDER — PREDNISONE 10 MG PO TABS: ORAL_TABLET | ORAL | Status: AC

## 2015-08-26 MED ORDER — METOPROLOL TARTRATE 25 MG/10 ML ORAL SUSPENSION
6.2500 mg | Freq: Two times a day (BID) | ORAL | Status: DC
  Administered 2015-08-26 – 2015-08-27 (×3): 6.25 mg via ORAL
  Filled 2015-08-26 (×3): qty 10

## 2015-08-26 MED ORDER — PREDNISONE 10 MG PO TABS: 30.0000 mg | ORAL_TABLET | Freq: Every day | ORAL | Status: DC

## 2015-08-26 MED ORDER — POTASSIUM CHLORIDE ER 10 MEQ PO TBCR: 20.0000 meq | EXTENDED_RELEASE_TABLET | Freq: Every day | ORAL | Status: AC

## 2015-08-26 MED ORDER — MORPHINE SULFATE (CONCENTRATE) 10 MG /0.5 ML PO SOLN: 5.0000 mg | ORAL | Status: AC | PRN

## 2015-08-26 MED ORDER — MORPHINE SULFATE (CONCENTRATE) 10 MG/0.5ML PO SOLN
5.0000 mg | ORAL | Status: DC | PRN
  Administered 2015-08-26: 5 mg via ORAL
  Filled 2015-08-26: qty 0.5

## 2015-08-26 MED ORDER — DILTIAZEM HCL ER COATED BEADS 240 MG PO CP24: 240.0000 mg | ORAL_CAPSULE | Freq: Every day | ORAL | Status: AC

## 2015-08-26 MED ORDER — INSULIN GLARGINE 100 UNIT/ML ~~LOC~~ SOLN
10.0000 [IU] | Freq: Once | SUBCUTANEOUS | Status: AC
  Administered 2015-08-26: 10 [IU] via SUBCUTANEOUS
  Filled 2015-08-26: qty 0.1

## 2015-08-26 MED ORDER — FLUTICASONE PROPIONATE 50 MCG/ACT NA SUSP: 2.0000 | Freq: Every day | NASAL | Status: AC

## 2015-08-26 MED ORDER — INSULIN ASPART 100 UNIT/ML ~~LOC~~ SOLN: 12.0000 [IU] | Freq: Once | SUBCUTANEOUS | Status: DC

## 2015-08-26 MED ORDER — ALPRAZOLAM 0.5 MG PO TABS: 0.5000 mg | ORAL_TABLET | Freq: Every day | ORAL | Status: AC | PRN

## 2015-08-26 NOTE — Progress Notes (Signed)
08/26/2015 Patient blood sugar at 1641 was 448 she was given novalog 15 units and Dr Allyson Sabal was made aware. Order was given to give lantus 10 unit. [facility] RN.

## 2015-08-26 NOTE — Progress Notes (Signed)
Daily Progress Note   Patient Name: Joann [name]       Date: 08/26/2015 DOB: November 15, 1960  Age: 54 y.o. MRN#: [id number] Attending Physician: Reyne Dumas, MD Primary Care Physician: Velna Hatchet, MD Admit Date: 08/21/2015  Reason for Consultation/Follow-up: Establishing goals of care  Subjective:     I met again with Ms. Mccrae and her brother at bedside today. She is wondering if she will go home today. She is tearful at times when she thinks of her death and leaving her family (especially her brother who she says has no one else). She asks many questions about hospice and is hopeful that she can still go on a planned vacation to [place] soon. I encouraged her to speak and plan with her hospice any trips she would like to take out of town - this seems very important to her. She also asks if she is allowed to go to church - I explained that she can do anything that she feels up too (although cautioned her to be aware with flu season approaching). Offered support in reminding her that hospice does not mean that she stops living but they are to be used to optimize her QOL for however long she has. She seems reassured. Therapeutic listening as her and her brother share stories/memories from their past.   I did speak with Yetta Glassman with [facility] who confirms she will be able to have BiPAP and is working to arrange all equipment needed for in the home. She will also go home with roxanol prn for dyspnea.    Length of Stay: 5 days  Current Medications: Scheduled Meds:  . dicyclomine  20 mg Oral 4 times per day  . diltiazem  240 mg Oral Daily  . DULoxetine  60 mg Oral q morning - 10a  . enoxaparin (LOVENOX) injection  40 mg Subcutaneous Q24H  . fluticasone  2 spray Each Nare Daily  . furosemide  60 mg Intravenous 3 times per day  . insulin aspart  0-15 Units Subcutaneous TID WC  . insulin aspart  0-5 Units Subcutaneous QHS  . insulin glargine  30 Units Subcutaneous BID  . levothyroxine   200 mcg Oral QAC breakfast  . pantoprazole  40 mg Oral BID AC  . piperacillin-tazobactam (ZOSYN)  IV  3.375 g Intravenous 3 times per day  . potassium chloride SA  20 mEq Oral Daily  . predniSONE  60 mg Oral Q breakfast  . sodium chloride  3 mL Intravenous Q12H  . topiramate  25 mg Oral BID  . traZODone  150 mg Oral QHS  . vancomycin  1,000 mg Intravenous Q12H    Continuous Infusions:    PRN Meds: ALPRAZolam, guaiFENesin, morphine injection  Palliative Performance Scale: 30%     Vital Signs: BP 113/96 mmHg  Pulse 97  Temp(Src) 98 F (36.7 C) (Axillary)  Resp 18  Ht 5' (1.524 m)  Wt 82 kg (180 lb 12.4 oz)  BMI 35.31 kg/m2  SpO2 97% SpO2: SpO2: 97 % O2 Device: O2 Device: Nasal Cannula O2 Flow Rate: O2 Flow Rate (L/min): 10 L/min  Intake/output summary:  Intake/Output Summary (Last 24 hours) at 08/26/15 1213 Last data filed at 08/26/15 1015  Gross per 24 hour  Intake    960 ml  Output   2125 ml  Net  -1165 ml   LBM: Last BM Date: 08/22/15 Baseline Weight: Weight: 82.555 kg (182 lb) Most recent weight: Weight: 82 kg (180 lb 12.4 oz)  Physical Exam: General: NAD, speaking with more ease and less SOB HEENT: [place]/AT CVS: [place] Resp: On 10L oxygen, conversing with less SOB today Neuro: Awake, alert, oriented x 3   Additional Data Reviewed: Recent Labs     08/25/15  0255  08/26/15  0332  WBC  15.4*  14.7*  HGB  11.1*  12.4  PLT  354  393  NA  136  137  BUN  35*  34*  CREATININE  1.15*  1.38*     Problem List:  Patient Active Problem List   Diagnosis Date Noted  . Palliative care encounter   . SOB (shortness of breath)   . Acute and chronic respiratory failure (acute-on-chronic) ([place]) 08/21/2015  . Acute on chronic respiratory failure ([place]) 05/14/2015  . Healthcare-associated pneumonia   . HCAP (healthcare-associated pneumonia) 05/05/2015  . Chronic respiratory failure with hypoxia ([place]) 05/02/2015  . Dizziness 05/02/2015  . Right-sided chest pain  05/02/2015  . Acute on chronic respiratory failure with hypoxia ([place]) 04/24/2015  . Pneumothorax 04/09/2015  . Pneumonitis   . OSA (obstructive sleep apnea)   . Asthma, chronic   . IPF (idiopathic pulmonary fibrosis) ([place])   . Respiratory failure ([place])   . Increased oxygen demand   . Shortness of breath   . Hypoxia 03/28/2015  . Diabetes mellitus type 2, controlled ([place]) 03/28/2015  . Chronic anemia 03/28/2015  . Hypertension 03/28/2015  . Hypothyroidism 03/28/2015  . Interstitial lung disease ([place]) 03/21/2015  . DM2 (diabetes mellitus, type 2) ([place]) 02/28/2015  . HTN (hypertension) 02/28/2015  . ILD (interstitial lung disease) ([place]) 02/28/2015  . Renal neoplasm 12/14/2013     Palliative Care Assessment & Plan    Code Status:  DNR  Goals of Care:  Home with hospice. Wants to lessen burden on her family. Wants to be at home with her dogs.   3. Symptom Management:  Dyspnea: Roxanol 5 mg every 2 hours prn (may increase to 10 mg if needed and increase to hourly if needed). BiPAP prn for comfort.   5. Prognosis: < 6 months  5. Discharge Planning: Home with Hospice   Thank you for allowing the Palliative Medicine Team to assist in the care of this patient.   Time In: 1130 Time Out: 1210 Total Time 66mn Prolonged Time Billed  no     Greater than 50%  of this time was spent counseling and coordinating care related to the above assessment and plan.     AVinie Sill NP Palliative Medicine Team Pager # [phone](M-F 8a-5p) Team Phone # [phone](Nights/Weekends)  08/26/2015, 12:13 PM

## 2015-08-26 NOTE — Discharge Summary (Addendum)
Physician Discharge Summary  Joann [name] MRN: [id number] DOB/AGE: 1960-12-07 54 y.o.  PCP: Velna Hatchet, MD   Admit date: 08/21/2015 Discharge date: 08/26/2015  Discharge Diagnoses:    Principal Problem:   Acute on chronic respiratory failure ([place]) Active Problems:   DM2 (diabetes mellitus, type 2) ([place])   HTN (hypertension)   Diabetes mellitus type 2, controlled (HCC)   Chronic anemia   Hypothyroidism   IPF (idiopathic pulmonary fibrosis) (HCC)   OSA (obstructive sleep apnea)   Acute and chronic respiratory failure (acute-on-chronic) (HCC)   Palliative care encounter   SOB (shortness of breath) acute on chronic CHF   Follow-up recommendations Follow-up with PCP in 3-5 days , including all  additional recommended appointments as below Follow-up CBC, CMP in 3-5 days Patient discharged home with hospice     Medication List    STOP taking these medications        estrogens (conjugated) 0.625 MG tablet  Commonly known as:  PREMARIN     lisinopril 20 MG tablet  Commonly known as:  PRINIVIL,ZESTRIL     metFORMIN 500 MG 24 hr tablet  Commonly known as:  GLUCOPHAGE-XR     Pirfenidone 267 MG Caps  Commonly known as:  ESBRIET      TAKE these medications        albuterol (2.5 MG/3ML) 0.083% nebulizer solution  Commonly known as:  PROVENTIL  Take 3 mLs (2.5 mg total) by nebulization every 2 (two) hours as needed for wheezing.     ALPRAZolam 0.5 MG tablet  Commonly known as:  XANAX  Take 1 tablet (0.5 mg total) by mouth daily as needed for anxiety.     cholecalciferol 1000 UNITS tablet  Commonly known as:  VITAMIN D  Take 1,000 Units by mouth every morning.     dicyclomine 20 MG tablet  Commonly known as:  BENTYL  Take 20 mg by mouth every 6 (six) hours.     diltiazem 240 MG 24 hr capsule  Commonly known as:  CARDIZEM CD  Take 1 capsule (240 mg total) by mouth daily.     DULoxetine 60 MG capsule  Commonly known as:  CYMBALTA  Take 60 mg by mouth  every morning.     esomeprazole 40 MG capsule  Commonly known as:  NEXIUM  Take 40 mg by mouth 2 (two) times daily before a meal.     fluticasone 50 MCG/ACT nasal spray  Commonly known as:  FLONASE  Place 2 sprays into both nostrils daily.     furosemide 40 MG tablet  Commonly known as:  LASIX  Take 1 tablet (40 mg total) by mouth 2 (two) times daily.     guaiFENesin 600 MG 12 hr tablet  Commonly known as:  MUCINEX  Take 1 tablet (600 mg total) by mouth 2 (two) times daily.     insulin aspart 100 UNIT/ML injection  Commonly known as:  novoLOG  Inject 0-9 Units into the skin 3 (three) times daily with meals.     insulin glargine 100 UNIT/ML injection  Commonly known as:  LANTUS  Inject 0.45 mLs (45 Units total) into the skin at bedtime.     ipratropium-albuterol 0.5-2.5 (3) MG/3ML Soln  Commonly known as:  DUONEB  Take 3 mLs by nebulization every 6 (six) hours.     levothyroxine 200 MCG tablet  Commonly known as:  SYNTHROID, LEVOTHROID  Take 200 mcg by mouth daily before breakfast.     meclizine 25 MG tablet  Commonly known as:  ANTIVERT  Take 25 mg by mouth 3 (three) times daily as needed for dizziness.     morphine CONCENTRATE 10 mg / 0.5 ml concentrated solution  Take 0.25 mLs (5 mg total) by mouth every 4 (four) hours as needed for severe pain.     MULTIVITAMIN GUMMIES WOMENS PO  Take by mouth daily.     ondansetron 4 MG tablet  Commonly known as:  ZOFRAN  Take 4 mg by mouth every 8 (eight) hours as needed for nausea or vomiting.     oxyCODONE 5 MG immediate release tablet  Commonly known as:  Oxy IR/ROXICODONE  Take 1-2 tablets (5-10 mg total) by mouth every 4 (four) hours as needed for severe pain.     potassium chloride 10 MEQ tablet  Commonly known as:  K-DUR  Take 2 tablets (20 mEq total) by mouth daily.     potassium chloride SA 20 MEQ tablet  Commonly known as:  K-DUR,KLOR-CON  Take 20 mEq by mouth daily.     predniSONE 10 MG tablet  Commonly  known as:  DELTASONE  Take 3 tablets (30 mg total) by mouth daily with breakfast.     topiramate 25 MG tablet  Commonly known as:  TOPAMAX  Take 25 mg by mouth 2 (two) times daily.     traZODone 150 MG tablet  Commonly known as:  DESYREL  Take 150 mg by mouth at bedtime.     vitamin E 400 UNIT capsule  Take 400 Units by mouth 2 (two) times daily.         Discharge Condition: *Stable  Disposition: Home with hospice   Consults:  Palliative care Pulmonary   Significant Diagnostic Studies:  Dg Chest 2 View  08/21/2015  CLINICAL DATA:  Pulmonary fibrosis.  Increasing shortness of breath EXAM: CHEST  2 VIEW COMPARISON:  August 05, 2015 FINDINGS: Widespread pulmonary fibrosis is present bilaterally, stable. There is no new opacities to suggest superimposed edema or consolidation. Heart is upper normal in size with pulmonary vascularity within normal limits. No adenopathy. There is postoperative change in the lower cervical spine. IMPRESSION: Widespread interstitial fibrosis, stable. No new opacity apparent. No change in cardiac silhouette. It must be cautioned that subtle superimposed pneumonia could easily be obscured by this degree of extensive fibrosis. Electronically Signed   By: Lowella Grip III M.D.   On: 08/21/2015 21:16   Ct Angio Chest Pe W/cm &/or Wo Cm  08/22/2015  CLINICAL DATA:  Worsening shortness of breath. History of pulmonary fibrosis. EXAM: CT ANGIOGRAPHY CHEST WITH CONTRAST TECHNIQUE: Multidetector CT imaging of the chest was performed using the standard protocol during bolus administration of intravenous contrast. Multiplanar CT image reconstructions and MIPs were obtained to evaluate the vascular anatomy. CONTRAST:  100 cc Omnipaque 350. COMPARISON:  CT of the chest 05/06/2015 FINDINGS: There is a small pericardial effusion. Thoracic aorta is normal in caliber. The main pulmonary artery is enlarged. There is no evidence of pulmonary embolus. The heart is  enlarged. There is a small pericardial effusion. Atherosclerotic disease of the aorta is seen. Visualized thyroid is unremarkable. There is anterior mediastinal lymphadenopathy, and bilateral hilar lymphadenopathy. The largest lymph node in the left perivascular location measures 1.8 cm. No pathologically enlarged axillary lymph nodes are seen. Airways are patent. Evaluation of the lungs demonstrates widespread bilateral alveolar and airspace consolidation. Calcified right pleural plaque and irregular right pleural thickening is also seen. There is a particularly prominent area of nodular pleural  and subpleural thickening in the anterior right upper lobe, with associated pleural calcifications ( image 45 axial images). Visualized upper abdomen is unremarkable. No evidence of acute osseous abnormality. Lower cervical spine fusion is noted. IMPRESSION: Enlarged heart with small pericardial effusion. Prominent main pulmonary artery, suggestive of pulmonary arterial hypertension. Atherosclerotic disease of the aorta. No evidence of pulmonary embolus. Stable anterior mediastinal lymphadenopathy. Severe widespread bilateral alveolar an airspace consolidation, similar to slightly worse than the prior study. This may represent chronic interstitial lung disease. Element of superimposed pulmonary edema cannot be excluded. Right pleural calcifications and irregular pleural thickening. Query prior trauma or asbestos exposure. Electronically Signed   By: Fidela Salisbury M.D.   On: 08/22/2015 18:09    2-D echo LV EF: 55% -  60%  ------------------------------------------------------------------- Indications:   Dyspnea 786.09.  ------------------------------------------------------------------- History:  Risk factors: Acute respiratory failure. Obstructive sleep apnea. Hypertension. Diabetes mellitus.  ------------------------------------------------------------------- Study Conclusions  - Left ventricle:  The cavity size was normal. Systolic function was normal. The estimated ejection fraction was in the range of 55% to 60%. Doppler parameters are consistent with abnormal left ventricular relaxation (grade 1 diastolic dysfunction). - Pericardium, extracardiac: A trivial pericardial effusion was identified.   Filed Weights   08/24/15 0500 08/25/15 0252 08/26/15 0417  Weight: 81.4 kg (179 lb 7.3 oz) 82.7 kg (182 lb 5.1 oz) 82 kg (180 lb 12.4 oz)     Microbiology: Recent Results (from the past 240 hour(s))  MRSA PCR Screening     Status: None   Collection Time: 08/22/15  1:23 AM  Result Value Ref Range Status   MRSA by PCR NEGATIVE NEGATIVE Final    Comment:        The GeneXpert MRSA Assay (FDA approved for NASAL specimens only), is one component of a comprehensive MRSA colonization surveillance program. It is not intended to diagnose MRSA infection nor to guide or monitor treatment for MRSA infections.        Blood Culture    Component Value Date/Time   SDES URINE, CATHETERIZED 05/07/2015 1213   SDES URINE, CATHETERIZED 05/07/2015 1213   SPECREQUEST Normal 05/07/2015 1213   SPECREQUEST NONE 05/07/2015 1213   CULT NO GROWTH 1 DAY 05/07/2015 1213   REPTSTATUS 05/08/2015 FINAL 05/07/2015 1213   REPTSTATUS 05/08/2015 FINAL 05/07/2015 1213      Labs: Results for orders placed or performed during the hospital encounter of 08/21/15 (from the past 48 hour(s))  Glucose, capillary     Status: Abnormal   Collection Time: 08/24/15  4:08 PM  Result Value Ref Range   Glucose-Capillary 432 (H) 65 - 99 mg/dL  Glucose, random     Status: Abnormal   Collection Time: 08/24/15  6:49 PM  Result Value Ref Range   Glucose, Bld 338 (H) 65 - 99 mg/dL  Glucose, capillary     Status: Abnormal   Collection Time: 08/24/15  8:00 PM  Result Value Ref Range   Glucose-Capillary 363 (H) 65 - 99 mg/dL  Glucose, capillary     Status: Abnormal   Collection Time: 08/24/15 10:34 PM   Result Value Ref Range   Glucose-Capillary 285 (H) 65 - 99 mg/dL  Glucose, capillary     Status: Abnormal   Collection Time: 08/25/15 12:10 AM  Result Value Ref Range   Glucose-Capillary 303 (H) 65 - 99 mg/dL  Procalcitonin     Status: None   Collection Time: 08/25/15  2:55 AM  Result Value Ref Range   Procalcitonin <0.10 ng/mL  Comment:        Interpretation: PCT (Procalcitonin) <= 0.5 ng/mL: Systemic infection (sepsis) is not likely. Local bacterial infection is possible. (NOTE)         ICU PCT Algorithm               Non ICU PCT Algorithm    ----------------------------     ------------------------------         PCT < 0.25 ng/mL                 PCT < 0.1 ng/mL     Stopping of antibiotics            Stopping of antibiotics       strongly encouraged.               strongly encouraged.    ----------------------------     ------------------------------       PCT level decrease by               PCT < 0.25 ng/mL       >= 80% from peak PCT       OR PCT 0.25 - 0.5 ng/mL          Stopping of antibiotics                                             encouraged.     Stopping of antibiotics           encouraged.    ----------------------------     ------------------------------       PCT level decrease by              PCT >= 0.25 ng/mL       < 80% from peak PCT        AND PCT >= 0.5 ng/mL            Continuin g antibiotics                                              encouraged.       Continuing antibiotics            encouraged.    ----------------------------     ------------------------------     PCT level increase compared          PCT > 0.5 ng/mL         with peak PCT AND          PCT >= 0.5 ng/mL             Escalation of antibiotics                                          strongly encouraged.      Escalation of antibiotics        strongly encouraged.   CBC     Status: Abnormal   Collection Time: 08/25/15  2:55 AM  Result Value Ref Range   WBC 15.4 (H) 4.0 - 10.5 K/uL    RBC 4.45 3.87 - 5.11 MIL/uL   Hemoglobin 11.1 (L) 12.0 - 15.0 g/dL   HCT 36.9 36.0 -  46.0 %   MCV 82.9 78.0 - 100.0 fL   MCH 24.9 (L) 26.0 - 34.0 pg   MCHC 30.1 30.0 - 36.0 g/dL   RDW 84.7 (H) 97.5 - 67.8 %   Platelets 354 150 - 400 K/uL  Basic metabolic panel     Status: Abnormal   Collection Time: 08/25/15  2:55 AM  Result Value Ref Range   Sodium 136 135 - 145 mmol/L   Potassium 4.0 3.5 - 5.1 mmol/L   Chloride 87 (L) 101 - 111 mmol/L   CO2 35 (H) 22 - 32 mmol/L   Glucose, Bld 348 (H) 65 - 99 mg/dL   BUN 35 (H) 6 - 20 mg/dL   Creatinine, Ser 7.05 (H) 0.44 - 1.00 mg/dL   Calcium 8.4 (L) 8.9 - 10.3 mg/dL   GFR calc non Af Amer 53 (L) >60 mL/min   GFR calc Af Amer >60 >60 mL/min    Comment: (NOTE) The eGFR has been calculated using the CKD EPI equation. This calculation has not been validated in all clinical situations. eGFR's persistently <60 mL/min signify possible Chronic Kidney Disease.    Anion gap 14 5 - 15  Glucose, capillary     Status: Abnormal   Collection Time: 08/25/15  4:49 AM  Result Value Ref Range   Glucose-Capillary 376 (H) 65 - 99 mg/dL  Glucose, capillary     Status: Abnormal   Collection Time: 08/25/15  8:31 AM  Result Value Ref Range   Glucose-Capillary 296 (H) 65 - 99 mg/dL  Glucose, capillary     Status: Abnormal   Collection Time: 08/25/15 12:24 PM  Result Value Ref Range   Glucose-Capillary 317 (H) 65 - 99 mg/dL  Glucose, capillary     Status: Abnormal   Collection Time: 08/25/15  5:11 PM  Result Value Ref Range   Glucose-Capillary 437 (H) 65 - 99 mg/dL  Glucose, capillary     Status: Abnormal   Collection Time: 08/25/15  8:55 PM  Result Value Ref Range   Glucose-Capillary 440 (H) 65 - 99 mg/dL  Comprehensive metabolic panel     Status: Abnormal   Collection Time: 08/26/15  3:32 AM  Result Value Ref Range   Sodium 137 135 - 145 mmol/L   Potassium 4.4 3.5 - 5.1 mmol/L   Chloride 86 (L) 101 - 111 mmol/L   CO2 37 (H) 22 - 32 mmol/L    Glucose, Bld 328 (H) 65 - 99 mg/dL   BUN 34 (H) 6 - 20 mg/dL   Creatinine, Ser 3.49 (H) 0.44 - 1.00 mg/dL   Calcium 8.4 (L) 8.9 - 10.3 mg/dL   Total Protein 7.1 6.5 - 8.1 g/dL   Albumin 3.3 (L) 3.5 - 5.0 g/dL   AST 16 15 - 41 U/L   ALT 26 14 - 54 U/L   Alkaline Phosphatase 79 38 - 126 U/L   Total Bilirubin 0.7 0.3 - 1.2 mg/dL   GFR calc non Af Amer 42 (L) >60 mL/min   GFR calc Af Amer 49 (L) >60 mL/min    Comment: (NOTE) The eGFR has been calculated using the CKD EPI equation. This calculation has not been validated in all clinical situations. eGFR's persistently <60 mL/min signify possible Chronic Kidney Disease.    Anion gap 14 5 - 15  CBC     Status: Abnormal   Collection Time: 08/26/15  3:32 AM  Result Value Ref Range   WBC 14.7 (H) 4.0 - 10.5 K/uL  RBC 4.87 3.87 - 5.11 MIL/uL   Hemoglobin 12.4 12.0 - 15.0 g/dL   HCT 40.1 36.0 - 46.0 %   MCV 82.3 78.0 - 100.0 fL   MCH 25.5 (L) 26.0 - 34.0 pg   MCHC 30.9 30.0 - 36.0 g/dL   RDW 17.6 (H) 11.5 - 15.5 %   Platelets 393 150 - 400 K/uL  Glucose, capillary     Status: Abnormal   Collection Time: 08/26/15  4:47 AM  Result Value Ref Range   Glucose-Capillary 311 (H) 65 - 99 mg/dL  Glucose, capillary     Status: Abnormal   Collection Time: 08/26/15  7:57 AM  Result Value Ref Range   Glucose-Capillary 231 (H) 65 - 99 mg/dL  Glucose, capillary     Status: Abnormal   Collection Time: 08/26/15 12:31 PM  Result Value Ref Range   Glucose-Capillary 305 (H) 65 - 99 mg/dL     Lipid Panel  No results found for: CHOL, TRIG, HDL, CHOLHDL, VLDL, LDLCALC, LDLDIRECT   Lab Results  Component Value Date   HGBA1C 8.6* 02/27/2015     Lab Results  Component Value Date   CREATININE 1.38* 08/26/2015     HPI :54yo female with hx severe IPF on 8L home O2, OSA, DM and HTN awaiting lung transplant w/u at [facility] clinic (scheduled to go there 10/16). Presented 10/12 with increased SOB, fever, increased cough with purulent sputum. Admitted  by [facility] with HCAP v IPF flare and started on IV abx, IV steroids. She had minimal improvement and on 10/13 had increased dyspnea/WOB and hypoxia despite bipap and PCCM consulted.   States fever started Tuesday, increased cough and SOB above baseline. Wears 8L O2 at home. Was to leave 10/16 for [facility] clinic for lung transplant w/u. Denies orthopnea, BLE edema, chest pain, syncope, hemoptysis, recent sick contacts.   HOSPITAL COURSE:   1. Acute on chronic respiratory failure:  The patient had low-grade fever and worsening hypoxia consistent with IPF flare. Vs viral infection or HCAP , no evidence of PE, Bilateral Venous duplex negative Appreciate pulmonary input, currently requiring 10-15 L of oxygen and oxygen saturation is in the 80s, BiPAP at night Initially started on vanc and zosyn,for broader coverage for HCAP-pulmonary recommends to stop all antibiotics is no evidence of healthcare associated pneumonia, Pro calcitonin negative 3 Discontinue Solu-Medrol and switched to prednisone, taper down to 30-40 mg next week Pulmonology recommends to discontinue Esbriet Continue Lasix given history of acute diastolic heart failure, renal function stable, 2-D echo per pulmonary recommendations, results as above Not a candidate for lung transplant secondary to increased BMI Continue BiPAP when necessary, daily at bedtime when necessary Acceptable oxygen saturation is greater than 75% hold esbriet - will reassess restart v starting [facility] as outpatient. Home hospice with morphine and BiPAP and o2 and lasix $RemoveB'40mg'bmdtZfgu$  daily + prednisone taper down next week to twot o baseline 30-$RemoveBefor'40mg'qIJwxaoMeTAS$  daily  Goals of care-Palliative care consult-continue DO NOT RESUSCITATE, continue BiPAP for comfort, started on Roxanol 5 mg   Sinus tachycardia, likely secondary to hypoxemic respiratory failure,anxiety, repeat EKG , negative cardiac enzymes 2, patient endorses no chest pain, repeat 2-D echo with results as above, Started  on Cardizem by mouth long-acting given need for IV Cardizem pushes overnight. Also patient was placed on Lopressor by pulmonary   2. NIDDM:  Uncontrolled secondary to IV steroids Continue Lantus at the home dose Discontinue metformin Patient advised to taper Lantus based on her CBGs  3. HTN:  Stable.  DC Lisinopril as creatinine trending up due to Lasix  4. Hypothyroidism:  Stable. -Continue home levothyroxine -TSH is ordered  5. Chronic normocytic anemia:  Stable. Leukocytosis likely secondary to steroids  6. Depression and anxiety:  Stable.  -Continue home duloxetine and alprazolam and topiramate    Discharge Exam:    Blood pressure 124/87, pulse 102, temperature 97.6 F (36.4 C), temperature source Oral, resp. rate 18, height 5' (1.524 m), weight 82 kg (180 lb 12.4 oz), SpO2 96 %.  Gen: Well nourished female. Tearful, short of breath at rest Resp: On 15 Liters. 3-5 word dyspnea Cardiac: Tachy        Discharge Instructions    Diet - low sodium heart healthy    Complete by:  As directed      Increase activity slowly    Complete by:  As directed            Follow-up Information    Follow up with Velna Hatchet, MD. Schedule an appointment as soon as possible for a visit in 3 days.   Specialty:  Internal Medicine   Contact information:   [address] [place] [address] [phone]       Follow up with [facility], MD. Schedule an appointment as soon as possible for a visit in 1 week.   Specialty:  Pulmonary Disease   Contact information:   [place] [address] [phone]       Signed: Reyne Dumas 08/26/2015, 1:41 PM        Time spent >45 mins

## 2015-08-26 NOTE — Telephone Encounter (Signed)
lmtcb x1 for [place] with Hospice.

## 2015-08-26 NOTE — Telephone Encounter (Signed)
yes

## 2015-08-26 NOTE — Consult Note (Signed)
[facility] °Referral for hospice services in the home received from Debbie Dowell, RN, CM this morning with expected discharge tomorrow. Advised later that discharge is expected today. °3:00pm Met with the patient at bedside. Present the meeting:Her brother. °Reviewed hospice philosophy and scope of services that are consistent with Goals of Care. °The patient requests DNR status, and verbalizes understanding that her disease is terminal.  °She requests that her pulmonologist, Dr. Ramaswamy, serve as her attending for hospice. °Medical Records and current clinical status of the patient reviewed by Dr. Amy Baruch, and the patient was approved for hospice services in the home, including approval for BIPAP. °DME in home: [facility], O2 concentrator from [employer] (will be changed out to [facility]) °The patient now requires BIPAP - current settings obtained from RT. [facility] notified and requires a Rx. °Order obtained from pulmonologist. [facility] notified and does not accept % for O2, only L/M. °Sue Fuller, RT, notified and advised that liter flow conversion for 50% is 7.5L/M for bleed-in on BIPAP. °Dr. Abrol, attending, notified, and agreed to delay discharge until tomorrow, to allow sufficient time for [facility] to deliver and test out effectiveness of BIPAP at the hospital. °The patient will discharge home via car tomorrow, when her spouse completes his workday (~ 5:00pm) ° °Diana Stevens RN, Hospital Liaison °[facility] °[phone] ° ° °

## 2015-08-26 NOTE — Telephone Encounter (Signed)
LMOM for pt to return call. 

## 2015-08-26 NOTE — Consult Note (Signed)
   [facility] CM Inpatient Consult   08/26/2015  LANGLEY FLATLEY September 12, 1961 [id number]    Came by to visit Mrs. Wurster to offer support and encouragement. She reports she is now going to go home with [facility] [place]. Appreciative of visit. Will update [facility]/ Link to Wellness team.   Marthenia Rolling, MSN-Ed, RN,BSN [facility] Liaison [phone]

## 2015-08-26 NOTE — Progress Notes (Addendum)
    S: Feeling a bit better from dyspnea statndponig. Complaints of tachycardia despite cardizem. Tearful after [facility] said she has to lose weight to BMI < 30 but says she is no longer interested in weight loss program [employer]. She is ok with where she is in life and poor prognosis and going home with hospice with focus on quality of life  O: looks better than 2 days ago AxOX3 Obese   Recent Labs Lab 08/23/15 1317 08/24/15 0310 08/25/15 0255  PROCALCITON <0.10 <0.10 <0.10     A: Acute on chronic resp failure - IPF flare, acute on chronic diast chf as etiologies. Doubt HCAP because of normal PCT x 3. Class 4 dyspnea due to above + sinus tach  P:  dc antibiotics Add lopressor low dose to cardizem to help tachycardia For IPF: hold esbriet - will reassess restart v starting [facility] as outpatient. Home hospice with morphine and BiPAP and o2 and lasix 40mg  daily + prednisone taper down next week to twot o baseline 30-40mg  daily  - accept pulse ox > 75%  PRognosis is weeks to months  50% of this > 25 min encounter  Face to face counseluing and coordination of care - with patient, RN Wilhemena Durie and MD Dr Allyson Sabal    Dr. Brand Males, M.D., [facility].C.P Pulmonary and Critical Care Medicine Staff Physician [facility] Pager: [phone], If no answer or between  15:00h - 7:00h: call [phone]  08/26/2015 1:18 PM

## 2015-08-26 NOTE — Clinical Documentation Improvement (Signed)
Internal Medicine Pulmonology  Based on the clinical findings, documentation and treatment please clarify if there is a suspected/likely element of Acute on Chronic CHF.   Mild Acute on chronic diastolic CHF  Other  Clinically Undetermined  Supporting Information: -- 10/15 BNP 176 -- 10/13 CT: "small pericardial effusion" and "Element of superimposed pulmonary edema cannot be excluded" -- Per Progress Note "Continue Lasix given history of acute diastolic heart failure"  Please exercise your independent, professional judgment when responding. A specific answer is not anticipated or expected.   Thank You, [place] [phone]

## 2015-08-26 NOTE — Telephone Encounter (Signed)
Will route message to MR to address.  MR - will you be the pt's attending MD while in Hospice? Thanks.

## 2015-08-26 NOTE — Care Management Note (Signed)
Case Management Note  Patient Details  Name: Joann [name] MRN: [id number] Date of Birth: 25-Mar-1961  Subjective/Objective:       Adm w resp failure             Action/Plan: lives w husband, pcp dr Ardeth Perfect. Has home o2 w [facility], has lots of other eq at home she had purchased for another fam member   Expected Discharge Date:                  Expected Discharge Plan:  Home w Hospice Care  In-House Referral:     Discharge planning Services  CM Consult  Post Acute Care Choice:  Durable Medical Equipment, Home Health Choice offered to:  Patient  DME Arranged:  Bipap DME Agency:  Other - Comment  HH Arranged:  RN, Social Work [employer] Agency:  [place]  Status of Service:     Medicare Important Message Given:    Date Medicare IM Given:    Medicare IM give by:    Date Additional Medicare IM Given:    Additional Medicare Important Message give by:     If discussed at [place] of Stay Meetings, dates discussed:    Additional Comments: [facility] would [facility]. Spoke w them and they can take her even if in [place]. Ref [facility]ed to [facility]. Pt's o2 w [facility]ria. She needs bipap. Have alerted hospice of need for bipap. Await call back from [facility].  Lacretia Leigh, RN 08/26/2015, 9:36 AM

## 2015-08-27 ENCOUNTER — Encounter (HOSPITAL_COMMUNITY): Payer: 59

## 2015-08-27 ENCOUNTER — Encounter (HOSPITAL_COMMUNITY): Payer: Self-pay | Admitting: *Deleted

## 2015-08-27 LAB — GLUCOSE, CAPILLARY
GLUCOSE-CAPILLARY: 212 mg/dL — AB (ref 65–99)
GLUCOSE-CAPILLARY: 127 mg/dL — AB (ref 65–99)
GLUCOSE-CAPILLARY: 57 mg/dL — AB (ref 65–99)

## 2015-08-27 NOTE — Discharge Summary (Signed)
Physician Discharge Summary  Joann [name] MRN: [id number] DOB/AGE: [birth year] 54 y.o.  PCP: Velna Hatchet, MD   Admit date: 08/21/2015 Discharge date: 08/27/2015  Discharge Diagnoses:    Principal Problem:   Acute on chronic respiratory failure ([place]) Active Problems:   DM2 (diabetes mellitus, type 2) ([place])   HTN (hypertension)   Diabetes mellitus type 2, controlled (HCC)   Chronic anemia   Hypothyroidism   IPF (idiopathic pulmonary fibrosis) (HCC)   OSA (obstructive sleep apnea)   Acute and chronic respiratory failure (acute-on-chronic) (HCC)   Palliative care encounter   SOB (shortness of breath) acute on chronic CHF   Follow-up recommendations Follow-up with PCP in 3-5 days , including all  additional recommended appointments as below Follow-up CBC, CMP in 3-5 days Patient discharged home with hospice     Medication List    STOP taking these medications        estrogens (conjugated) 0.625 MG tablet  Commonly known as:  PREMARIN     lisinopril 20 MG tablet  Commonly known as:  PRINIVIL,ZESTRIL     metFORMIN 500 MG 24 hr tablet  Commonly known as:  GLUCOPHAGE-XR     Pirfenidone 267 MG Caps  Commonly known as:  ESBRIET      TAKE these medications        albuterol (2.5 MG/3ML) 0.083% nebulizer solution  Commonly known as:  PROVENTIL  Take 3 mLs (2.5 mg total) by nebulization every 2 (two) hours as needed for wheezing.     ALPRAZolam 0.5 MG tablet  Commonly known as:  XANAX  Take 1 tablet (0.5 mg total) by mouth daily as needed for anxiety.     cholecalciferol 1000 UNITS tablet  Commonly known as:  VITAMIN D  Take 1,000 Units by mouth every morning.     dicyclomine 20 MG tablet  Commonly known as:  BENTYL  Take 20 mg by mouth every 6 (six) hours.     diltiazem 240 MG 24 hr capsule  Commonly known as:  CARDIZEM CD  Take 1 capsule (240 mg total) by mouth daily.     DULoxetine 60 MG capsule  Commonly known as:  CYMBALTA  Take 60 mg by mouth  every morning.     esomeprazole 40 MG capsule  Commonly known as:  NEXIUM  Take 40 mg by mouth 2 (two) times daily before a meal.     fluticasone 50 MCG/ACT nasal spray  Commonly known as:  FLONASE  Place 2 sprays into both nostrils daily.     furosemide 40 MG tablet  Commonly known as:  LASIX  Take 1 tablet (40 mg total) by mouth 2 (two) times daily.     guaiFENesin 600 MG 12 hr tablet  Commonly known as:  MUCINEX  Take 1 tablet (600 mg total) by mouth 2 (two) times daily.     insulin aspart 100 UNIT/ML injection  Commonly known as:  novoLOG  Inject 0-9 Units into the skin 3 (three) times daily with meals.     insulin glargine 100 UNIT/ML injection  Commonly known as:  LANTUS  Inject 0.45 mLs (45 Units total) into the skin at bedtime.     ipratropium-albuterol 0.5-2.5 (3) MG/3ML Soln  Commonly known as:  DUONEB  Take 3 mLs by nebulization every 6 (six) hours.     levothyroxine 200 MCG tablet  Commonly known as:  SYNTHROID, LEVOTHROID  Take 200 mcg by mouth daily before breakfast.     meclizine 25 MG tablet  Commonly known as:  ANTIVERT  Take 25 mg by mouth 3 (three) times daily as needed for dizziness.     metoprolol tartrate 25 MG tablet  Commonly known as:  LOPRESSOR  Take 0.5 tablets (12.5 mg total) by mouth 2 (two) times daily.     morphine CONCENTRATE 10 mg / 0.5 ml concentrated solution  Take 0.25 mLs (5 mg total) by mouth every 4 (four) hours as needed for severe pain.     MULTIVITAMIN GUMMIES WOMENS PO  Take by mouth daily.     ondansetron 4 MG tablet  Commonly known as:  ZOFRAN  Take 4 mg by mouth every 8 (eight) hours as needed for nausea or vomiting.     oxyCODONE 5 MG immediate release tablet  Commonly known as:  Oxy IR/ROXICODONE  Take 1-2 tablets (5-10 mg total) by mouth every 4 (four) hours as needed for severe pain.     potassium chloride 10 MEQ tablet  Commonly known as:  K-DUR  Take 2 tablets (20 mEq total) by mouth daily.     potassium  chloride SA 20 MEQ tablet  Commonly known as:  K-DUR,KLOR-CON  Take 20 mEq by mouth daily.     predniSONE 10 MG tablet  Commonly known as:  DELTASONE  6 tablets for 5 days, 5 tablets for 5 days, 4 tablets for 5 days, 3 tablets daily and continue     topiramate 25 MG tablet  Commonly known as:  TOPAMAX  Take 25 mg by mouth 2 (two) times daily.     traZODone 150 MG tablet  Commonly known as:  DESYREL  Take 150 mg by mouth at bedtime.     vitamin E 400 UNIT capsule  Take 400 Units by mouth 2 (two) times daily.         Discharge Condition: *Stable  Disposition: Home with hospice   Consults:  Palliative care Pulmonary   Significant Diagnostic Studies:  Dg Chest 2 View  08/21/2015  CLINICAL DATA:  Pulmonary fibrosis.  Increasing shortness of breath EXAM: CHEST  2 VIEW COMPARISON:  August 05, 2015 FINDINGS: Widespread pulmonary fibrosis is present bilaterally, stable. There is no new opacities to suggest superimposed edema or consolidation. Heart is upper normal in size with pulmonary vascularity within normal limits. No adenopathy. There is postoperative change in the lower cervical spine. IMPRESSION: Widespread interstitial fibrosis, stable. No new opacity apparent. No change in cardiac silhouette. It must be cautioned that subtle superimposed pneumonia could easily be obscured by this degree of extensive fibrosis. Electronically Signed   By: Lowella Grip III M.D.   On: 08/21/2015 21:16   Ct Angio Chest Pe W/cm &/or Wo Cm  08/22/2015  CLINICAL DATA:  Worsening shortness of breath. History of pulmonary fibrosis. EXAM: CT ANGIOGRAPHY CHEST WITH CONTRAST TECHNIQUE: Multidetector CT imaging of the chest was performed using the standard protocol during bolus administration of intravenous contrast. Multiplanar CT image reconstructions and MIPs were obtained to evaluate the vascular anatomy. CONTRAST:  100 cc Omnipaque 350. COMPARISON:  CT of the chest 05/06/2015 FINDINGS: There  is a small pericardial effusion. Thoracic aorta is normal in caliber. The main pulmonary artery is enlarged. There is no evidence of pulmonary embolus. The heart is enlarged. There is a small pericardial effusion. Atherosclerotic disease of the aorta is seen. Visualized thyroid is unremarkable. There is anterior mediastinal lymphadenopathy, and bilateral hilar lymphadenopathy. The largest lymph node in the left perivascular location measures 1.8 cm. No pathologically enlarged axillary lymph nodes  are seen. Airways are patent. Evaluation of the lungs demonstrates widespread bilateral alveolar and airspace consolidation. Calcified right pleural plaque and irregular right pleural thickening is also seen. There is a particularly prominent area of nodular pleural and subpleural thickening in the anterior right upper lobe, with associated pleural calcifications ( image 45 axial images). Visualized upper abdomen is unremarkable. No evidence of acute osseous abnormality. Lower cervical spine fusion is noted. IMPRESSION: Enlarged heart with small pericardial effusion. Prominent main pulmonary artery, suggestive of pulmonary arterial hypertension. Atherosclerotic disease of the aorta. No evidence of pulmonary embolus. Stable anterior mediastinal lymphadenopathy. Severe widespread bilateral alveolar an airspace consolidation, similar to slightly worse than the prior study. This may represent chronic interstitial lung disease. Element of superimposed pulmonary edema cannot be excluded. Right pleural calcifications and irregular pleural thickening. Query prior trauma or asbestos exposure. Electronically Signed   By: Fidela Salisbury M.D.   On: 08/22/2015 18:09    2-D echo LV EF: 55% -  60%  ------------------------------------------------------------------- Indications:   Dyspnea 786.09.  ------------------------------------------------------------------- History:  Risk factors: Acute respiratory failure.  Obstructive sleep apnea. Hypertension. Diabetes mellitus.  ------------------------------------------------------------------- Study Conclusions  - Left ventricle: The cavity size was normal. Systolic function was normal. The estimated ejection fraction was in the range of 55% to 60%. Doppler parameters are consistent with abnormal left ventricular relaxation (grade 1 diastolic dysfunction). - Pericardium, extracardiac: A trivial pericardial effusion was identified.   Filed Weights   08/25/15 0252 08/26/15 0417 08/27/15 0500  Weight: 82.7 kg (182 lb 5.1 oz) 82 kg (180 lb 12.4 oz) 82 kg (180 lb 12.4 oz)     Microbiology: Recent Results (from the past 240 hour(s))  MRSA PCR Screening     Status: None   Collection Time: 08/22/15  1:23 AM  Result Value Ref Range Status   MRSA by PCR NEGATIVE NEGATIVE Final    Comment:        The GeneXpert MRSA Assay (FDA approved for NASAL specimens only), is one component of a comprehensive MRSA colonization surveillance program. It is not intended to diagnose MRSA infection nor to guide or monitor treatment for MRSA infections.        Blood Culture    Component Value Date/Time   SDES URINE, CATHETERIZED 05/07/2015 1213   SDES URINE, CATHETERIZED 05/07/2015 1213   SPECREQUEST Normal 05/07/2015 1213   SPECREQUEST NONE 05/07/2015 1213   CULT NO GROWTH 1 DAY 05/07/2015 1213   REPTSTATUS 05/08/2015 FINAL 05/07/2015 1213   REPTSTATUS 05/08/2015 FINAL 05/07/2015 1213      Labs: Results for orders placed or performed during the hospital encounter of 08/21/15 (from the past 48 hour(s))  Glucose, capillary     Status: Abnormal   Collection Time: 08/25/15 12:24 PM  Result Value Ref Range   Glucose-Capillary 317 (H) 65 - 99 mg/dL  Glucose, capillary     Status: Abnormal   Collection Time: 08/25/15  5:11 PM  Result Value Ref Range   Glucose-Capillary 437 (H) 65 - 99 mg/dL  Glucose, capillary     Status: Abnormal   Collection  Time: 08/25/15  8:55 PM  Result Value Ref Range   Glucose-Capillary 440 (H) 65 - 99 mg/dL  Comprehensive metabolic panel     Status: Abnormal   Collection Time: 08/26/15  3:32 AM  Result Value Ref Range   Sodium 137 135 - 145 mmol/L   Potassium 4.4 3.5 - 5.1 mmol/L   Chloride 86 (L) 101 - 111 mmol/L   CO2 37 (  H) 22 - 32 mmol/L   Glucose, Bld 328 (H) 65 - 99 mg/dL   BUN 34 (H) 6 - 20 mg/dL   Creatinine, Ser 1.38 (H) 0.44 - 1.00 mg/dL   Calcium 8.4 (L) 8.9 - 10.3 mg/dL   Total Protein 7.1 6.5 - 8.1 g/dL   Albumin 3.3 (L) 3.5 - 5.0 g/dL   AST 16 15 - 41 U/L   ALT 26 14 - 54 U/L   Alkaline Phosphatase 79 38 - 126 U/L   Total Bilirubin 0.7 0.3 - 1.2 mg/dL   GFR calc non Af Amer 42 (L) >60 mL/min   GFR calc Af Amer 49 (L) >60 mL/min    Comment: (NOTE) The eGFR has been calculated using the CKD EPI equation. This calculation has not been validated in all clinical situations. eGFR's persistently <60 mL/min signify possible Chronic Kidney Disease.    Anion gap 14 5 - 15  CBC     Status: Abnormal   Collection Time: 08/26/15  3:32 AM  Result Value Ref Range   WBC 14.7 (H) 4.0 - 10.5 K/uL   RBC 4.87 3.87 - 5.11 MIL/uL   Hemoglobin 12.4 12.0 - 15.0 g/dL   HCT 40.1 36.0 - 46.0 %   MCV 82.3 78.0 - 100.0 fL   MCH 25.5 (L) 26.0 - 34.0 pg   MCHC 30.9 30.0 - 36.0 g/dL   RDW 17.6 (H) 11.5 - 15.5 %   Platelets 393 150 - 400 K/uL  Glucose, capillary     Status: Abnormal   Collection Time: 08/26/15  4:47 AM  Result Value Ref Range   Glucose-Capillary 311 (H) 65 - 99 mg/dL  Glucose, capillary     Status: Abnormal   Collection Time: 08/26/15  7:57 AM  Result Value Ref Range   Glucose-Capillary 231 (H) 65 - 99 mg/dL  Vancomycin, trough     Status: Abnormal   Collection Time: 08/26/15 12:30 PM  Result Value Ref Range   Vancomycin Tr 23 (H) 10.0 - 20.0 ug/mL  Glucose, capillary     Status: Abnormal   Collection Time: 08/26/15 12:31 PM  Result Value Ref Range   Glucose-Capillary 305 (H) 65  - 99 mg/dL  Glucose, capillary     Status: Abnormal   Collection Time: 08/26/15  4:41 PM  Result Value Ref Range   Glucose-Capillary 448 (H) 65 - 99 mg/dL  Glucose, capillary     Status: Abnormal   Collection Time: 08/26/15  9:58 PM  Result Value Ref Range   Glucose-Capillary 412 (H) 65 - 99 mg/dL  Glucose, capillary     Status: Abnormal   Collection Time: 08/27/15  1:02 AM  Result Value Ref Range   Glucose-Capillary 212 (H) 65 - 99 mg/dL  Glucose, capillary     Status: Abnormal   Collection Time: 08/27/15  7:43 AM  Result Value Ref Range   Glucose-Capillary 57 (L) 65 - 99 mg/dL  Glucose, capillary     Status: Abnormal   Collection Time: 08/27/15  8:21 AM  Result Value Ref Range   Glucose-Capillary 127 (H) 65 - 99 mg/dL     Lipid Panel  No results found for: CHOL, TRIG, HDL, CHOLHDL, VLDL, LDLCALC, LDLDIRECT   Lab Results  Component Value Date   HGBA1C 8.6* 02/27/2015     Lab Results  Component Value Date   CREATININE 1.38* 08/26/2015     HPI :54yo female with hx severe IPF on 8L home O2, OSA, DM and HTN awaiting  lung transplant w/u at [facility] clinic (scheduled to go there 10/16). Presented 10/12 with increased SOB, fever, increased cough with purulent sputum. Admitted by [facility] with HCAP v IPF flare and started on IV abx, IV steroids. She had minimal improvement and on 10/13 had increased dyspnea/WOB and hypoxia despite bipap and PCCM consulted.   States fever started Tuesday, increased cough and SOB above baseline. Wears 8L O2 at home. Was to leave 10/16 for [facility] clinic for lung transplant w/u. Denies orthopnea, BLE edema, chest pain, syncope, hemoptysis, recent sick contacts.   HOSPITAL COURSE:   1. Acute on chronic respiratory failure:  The patient had low-grade fever and worsening hypoxia consistent with IPF flare. Vs viral infection or HCAP , no evidence of PE, Bilateral Venous duplex negative Appreciate pulmonary input, currently requiring 10-15 L of oxygen  and oxygen saturation is in the 80s, BiPAP at night Initially started on vanc and zosyn,for broader coverage for HCAP-pulmonary recommends to stop all antibiotics is no evidence of healthcare associated pneumonia, Pro calcitonin negative 3 Discontinue Solu-Medrol and switched to prednisone, taper down to 30-40 mg next week Pulmonology recommends to discontinue Esbriet Continue Lasix given history of acute diastolic heart failure, renal function stable, 2-D echo per pulmonary recommendations, results as above Not a candidate for lung transplant secondary to increased BMI Continue BiPAP when necessary, daily at bedtime when necessary Acceptable oxygen saturation is greater than 75% hold esbriet - will reassess restart v starting [facility] as outpatient. Home hospice with morphine and BiPAP and o2 and lasix $RemoveB'40mg'wbKgPhgT$  daily + prednisone taper down next week to twot o baseline 30-$RemoveBefor'40mg'RPxYsCOrtfuX$  daily  Goals of care-Palliative care consult-continue DO NOT RESUSCITATE, continue BiPAP for comfort, started on Roxanol 5 mg   Sinus tachycardia, likely secondary to hypoxemic respiratory failure,anxiety, repeat EKG , negative cardiac enzymes 2, patient endorses no chest pain, repeat 2-D echo with results as above, Started on Cardizem by mouth long-acting given need for IV Cardizem pushes overnight. Also patient was placed on Lopressor by pulmonary   2. NIDDM:  Uncontrolled secondary to IV steroids Continue Lantus at the home dose Discontinue metformin Patient advised to taper Lantus based on her CBGs  3. HTN:  Stable. DC Lisinopril as creatinine trending up due to Lasix  4. Hypothyroidism:  Stable. -Continue home levothyroxine -TSH is ordered  5. Chronic normocytic anemia:  Stable. Leukocytosis likely secondary to steroids  6. Depression and anxiety:  Stable.  -Continue home duloxetine and alprazolam and topiramate    Discharge Exam:    Blood pressure 103/74, pulse 92, temperature 96.6 F (35.9  C), temperature source Axillary, resp. rate 18, height 5' (1.524 m), weight 82 kg (180 lb 12.4 oz), SpO2 95 %.  Gen: Well nourished female. Tearful, short of breath at rest Resp: On 15 Liters. 3-5 word dyspnea Cardiac: Tachy    Discharge Instructions    Bipap    Complete by:  As directed   Mandatory QHS PRN during waking hours  IPAP 16 EPAP 10 FiO2 50% Large full facemask     Diet - low sodium heart healthy    Complete by:  As directed      Diet - low sodium heart healthy    Complete by:  As directed      Increase activity slowly    Complete by:  As directed      Increase activity slowly    Complete by:  As directed            Follow-up Information  Follow up with Velna Hatchet, MD. Schedule an appointment as soon as possible for a visit in 3 days.   Specialty:  Internal Medicine   Contact information:   [address] [place] [address] [phone]       Follow up with [facility], MD. Schedule an appointment as soon as possible for a visit in 1 week.   Specialty:  Pulmonary Disease   Contact information:   [place] [address] [phone]       Signed: Reyne Dumas 08/27/2015, 10:39 AM        Time spent >45 mins

## 2015-08-27 NOTE — Progress Notes (Addendum)
Inpatient Diabetes Program Recommendations  AACE/ADA: New Consensus Statement on Inpatient Glycemic Control (2015)  Target Ranges:  Prepandial:   less than 140 mg/dL      Peak postprandial:   less than 180 mg/dL (1-2 hours)      Critically ill patients:  140 - 180 mg/dL   Review of Glycemic Control  Diabetes history: DM 2 Outpatient Diabetes medications: Lantus 45 units, Novolog 15 units TID meal coverage, Metformin 1,000 mg BID Current orders for Inpatient glycemic control: Lantus 30 units, Novolog Moderate + HS scales  Inpatient Diabetes Program Recommendations: Insulin - Basal: Patient's glucose increased into the 400's yesterday. Patient takes 45 units of basal at home and only received 30 units here. Please consider increasing basal insulin to Lantus 35 units if not discharged. Insulin - Meal Coverage: Patient takes 15 units of meal coverage at home. Please consider ordering Novolog 10 units TID with meals for meal coverage in addition to correction scale if not discharged.  Thanks,  Tama Headings RN, MSN, [facility] Inpatient Diabetes Coordinator Team Pager [phone] (8a-5p)

## 2015-08-27 NOTE — Telephone Encounter (Signed)
Spoke with Manus Gunning and advised that MR will be attending while pt is in Hospice.

## 2015-08-27 NOTE — Progress Notes (Signed)
Hypoglycemic Event  CBG:57 Treatment: 4 oz juice Symptoms: she said she is okay  Follow-up  CBG: Time: 0830 CBG Result:127 Possible Reasons for Event: she did not eat much last night/did not eat snack  Comments/MD notified: Dr. Allyson Sabal was paged    Venetia Constable [facility]

## 2015-08-28 ENCOUNTER — Other Ambulatory Visit: Payer: Self-pay | Admitting: *Deleted

## 2015-08-28 ENCOUNTER — Telehealth: Payer: Self-pay | Admitting: Internal Medicine

## 2015-08-28 NOTE — Telephone Encounter (Signed)
Called hospice, asked to speak with Sheena, line was disconnected, called back, was put through to sheena's line, line rang several times with no answer and no vm.  Wcb.

## 2015-08-28 NOTE — Telephone Encounter (Signed)
lmtcb for pt.  

## 2015-08-28 NOTE — Patient Outreach (Signed)
Attempt made to contact pt, offer support as view in Epic shows  pt discharged home 10/18 with Hospice.   Also, with pt going with Hospice, plan to discharge pt from RN CM services.   HIPPA compliant voice message left with contact number.      [name].   [place] [facility]  [phone]

## 2015-08-29 ENCOUNTER — Other Ambulatory Visit: Payer: Self-pay | Admitting: *Deleted

## 2015-08-29 ENCOUNTER — Encounter (HOSPITAL_COMMUNITY): Payer: 59

## 2015-08-29 NOTE — Patient Outreach (Signed)
Follow up phone call:  Spoke with pt,check on status since hospital discharge.  Pt confirmed Hospice started, nurse came out.  Pt states her BP medications were adjusted, feel better.  Informed pt with Hospice now in, will be discharging her from [place] services.    Offered pt my support and prayers.      Plan to discharge pt from RN CM services. Plan to request Mickel Baas [facility] CMA to close case- now receiving Hospice services.

## 2015-08-29 NOTE — Telephone Encounter (Signed)
Spoke with Joann (admissions nurse at [facility]), verified that pt was enrolled at hospice on 10/18 for pulmonary fibrosis.    Forwarding to MR as FYI.  Nothing further needed at this time.

## 2015-08-30 ENCOUNTER — Encounter: Payer: Self-pay | Admitting: *Deleted

## 2015-08-30 NOTE — Patient Outreach (Signed)
[facility]) Care Management  08/30/2015  Joann [name] [birth year] [id number]   Error entry.    [name].   [facility]  [phone]

## 2015-08-30 NOTE — Telephone Encounter (Signed)
Tried to call pt again today. LMOM that pt can call back in regards to this message. Will call message due to multiple calls to pt and no response.

## 2015-09-03 ENCOUNTER — Encounter: Payer: Self-pay | Admitting: *Deleted

## 2015-09-03 ENCOUNTER — Encounter (HOSPITAL_COMMUNITY): Payer: 59

## 2015-09-03 NOTE — Progress Notes (Signed)
Pulmonary Rehab Discharge Note  Joann [name] has been discharged from outpatient pulmonary rehab.  She has been referred to hospice to help with pallative care.  She was rejected for a lung transplant at [facility] and is too sick to withstand the approval process for a transplant at the [facility] in [place], [place].  She is at peace with the decision.  Her goals were not met in our program.

## 2015-09-03 NOTE — Addendum Note (Signed)
Encounter addended by: Lance Morin, RN on: 09/03/2015 10:17 AM<BR>     Documentation filed: Notes Section

## 2015-09-05 ENCOUNTER — Ambulatory Visit (INDEPENDENT_AMBULATORY_CARE_PROVIDER_SITE_OTHER): Payer: 59 | Admitting: Adult Health

## 2015-09-05 ENCOUNTER — Encounter: Payer: Self-pay | Admitting: Adult Health

## 2015-09-05 ENCOUNTER — Telehealth: Payer: Self-pay | Admitting: Adult Health

## 2015-09-05 ENCOUNTER — Encounter (HOSPITAL_COMMUNITY): Payer: 59

## 2015-09-05 VITALS — BP 112/80 | HR 109 | Temp 98.7°F | Ht 64.0 in | Wt 186.0 lb

## 2015-09-05 DIAGNOSIS — J849 Interstitial pulmonary disease, unspecified: Secondary | ICD-10-CM | POA: Diagnosis not present

## 2015-09-05 DIAGNOSIS — J9611 Chronic respiratory failure with hypoxia: Secondary | ICD-10-CM

## 2015-09-05 NOTE — Progress Notes (Signed)
   Subjective:    Patient ID: Joann [name], female    DOB: 05-26-1961, 54 y.o.   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 She was treated with high dose steroids.   Presented 10/12 with increased SOB, fever, increased cough with purulent sputum. Admitted by [facility] with HCAP v IPF flare and started on IV abx, IV steroids.  Palliative care consult with home hospice . Has been started on morphine which is helping. She is on BIPAP at night.  She was started on cardizem for tachycardia. Says she has not been able to take lopressor, it makes her too weak. Is only taking As needed if HR gets too high.   She says she is slightly better, remains very weak. Tapering prednisone down to 30mg .  Gets winded with minimal activity . She is going on trip to [place] tomorrow. Hospice has set up oxygen at [facility]. She has extra O2 for trip and family is going with her. She is aware of dangers but says she wants to go. She is off [place]  Now .  Was suppose to go to [facility] clinic earlier this month but missed it because of illness.    Review of Systems  Constitutional:   No  weight loss, night sweats,  Fevers, chills,  +fatigue, or  lassitude.  HEENT:   No headaches,  Difficulty swallowing,  Tooth/dental problems, or  Sore throat,                No sneezing, itching, ear ache, nasal congestion, post nasal drip,   CV:  No chest pain,  Orthopnea, PND, swelling in lower extremities, anasarca, dizziness, palpitations, syncope.   GI  No heartburn, indigestion, abdominal pain, nausea, vomiting, diarrhea, change in bowel habits, loss of appetite, bloody stools.   Resp:  No chest wall deformity  Skin: no rash or lesions.  GU: no dysuria, change in color of urine, no urgency or frequency.  No flank pain, no hematuria    MS:  No joint pain or swelling.  No decreased range of motion.  No back pain.  Psych:  No change in mood or affect. No depression or anxiety.  No memory loss.          Objective:   Physical Exam GEN: A/Ox3; pleasant , NAD, morbidly obese , in wc on O2 -high flow   HEENT:  [place]/AT,  EACs-clear, TMs-wnl, NOSE-clear, THROAT-clear, no lesions, no postnasal drip or exudate noted.   NECK:  Supple w/ fair ROM; no JVD; normal carotid impulses w/o bruits; no thyromegaly or nodules palpated; no lymphadenopathy.  RESP  BB crackles , .no accessory muscle use, no dullness to percussion  CARD:  RRR, no m/r/g  , no peripheral edema, pulses intact, no cyanosis or clubbing.  GI:   Soft & nt; nml bowel sounds; no organomegaly or masses detected.  Musco: Warm bil, no deformities or joint swelling noted.   Neuro: alert, no focal deficits noted.    Skin: Warm, no lesions or rashes          Assessment & Plan:

## 2015-09-05 NOTE — Telephone Encounter (Signed)
Form has not been received yet. Will check back in a few minutes.

## 2015-09-05 NOTE — Patient Instructions (Signed)
Taper prednisone as directed.  Continue on Oxygen to keep sats >88%.  Follow up Dr. Chase Caller in 3-4 weeks and As needed   Please contact office for sooner follow up if symptoms do not improve or worsen or seek emergency care

## 2015-09-06 NOTE — Assessment & Plan Note (Signed)
Taper prednisone as directed.  Continue on Oxygen to keep sats >88%.  Follow up Dr. Chase Caller in 3-4 weeks and As needed   Please contact office for sooner follow up if symptoms do not improve or worsen or seek emergency care

## 2015-09-06 NOTE — Telephone Encounter (Signed)
Pt came into office on 09/05/15 and received letter.  Form was completed and placed in TP's scan folder to be scanned into pt's chart. Original copy was given to pt.  Nothing further needed  Will sign off on message.

## 2015-09-06 NOTE — Telephone Encounter (Signed)
Joann [name] was this taken care of or received?

## 2015-09-06 NOTE — Assessment & Plan Note (Signed)
Recent flare-slightly improved  Cont w/ hospice care  Plan  Taper prednisone as directed.  Continue on Oxygen to keep sats >88%.  Follow up Dr. Chase Caller in 3-4 weeks and As needed   Please contact office for sooner follow up if symptoms do not improve or worsen or seek emergency care

## 2015-09-10 ENCOUNTER — Encounter (HOSPITAL_COMMUNITY): Payer: 59

## 2015-09-12 ENCOUNTER — Encounter (HOSPITAL_COMMUNITY): Payer: 59

## 2015-09-12 ENCOUNTER — Encounter (HOSPITAL_BASED_OUTPATIENT_CLINIC_OR_DEPARTMENT_OTHER): Payer: 59

## 2015-09-12 IMAGING — CR DG CHEST 2V
2 series · 2 of 2 positions shown · non-contrast
Comparison: 04/25/2015.

CLINICAL DATA: Initial encounter for two-day history of right-sided
chest pain. History pulmonary fibrosis.

EXAM:
CHEST  2 VIEW

[view not recorded (1 of 2)]
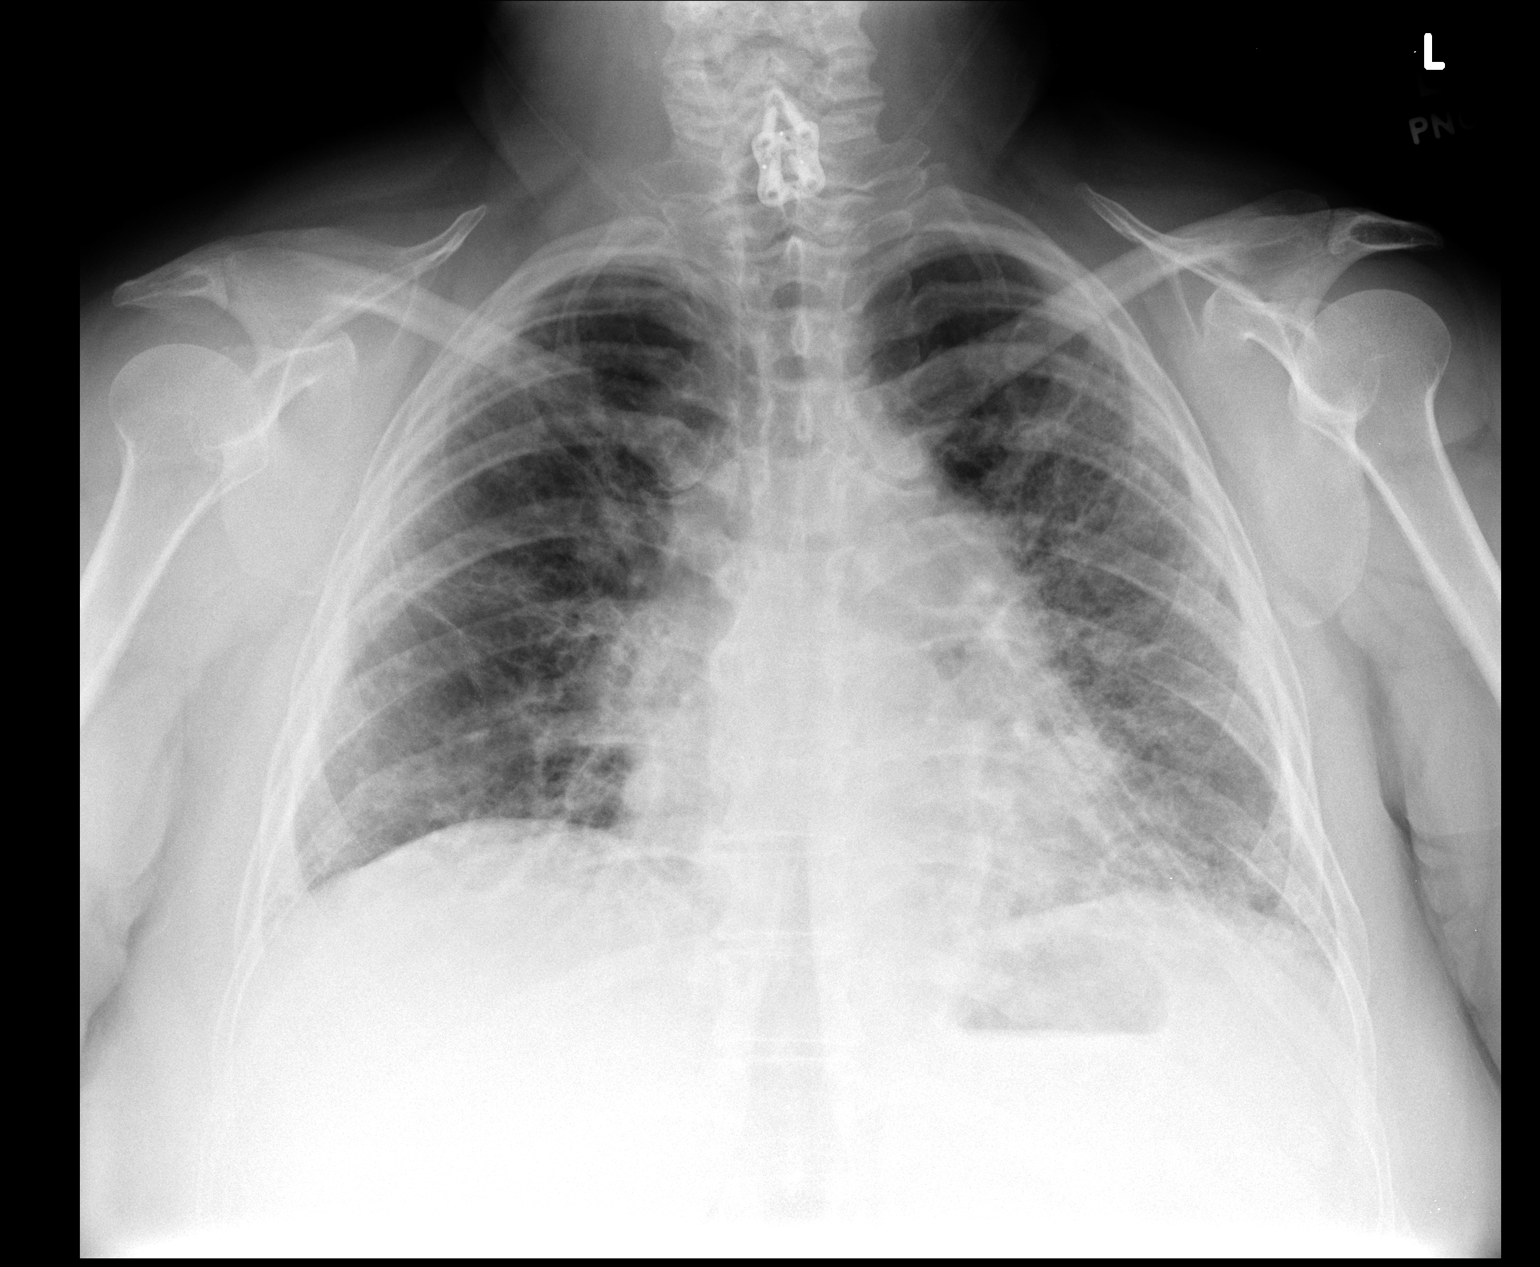

[view not recorded (2 of 2)]
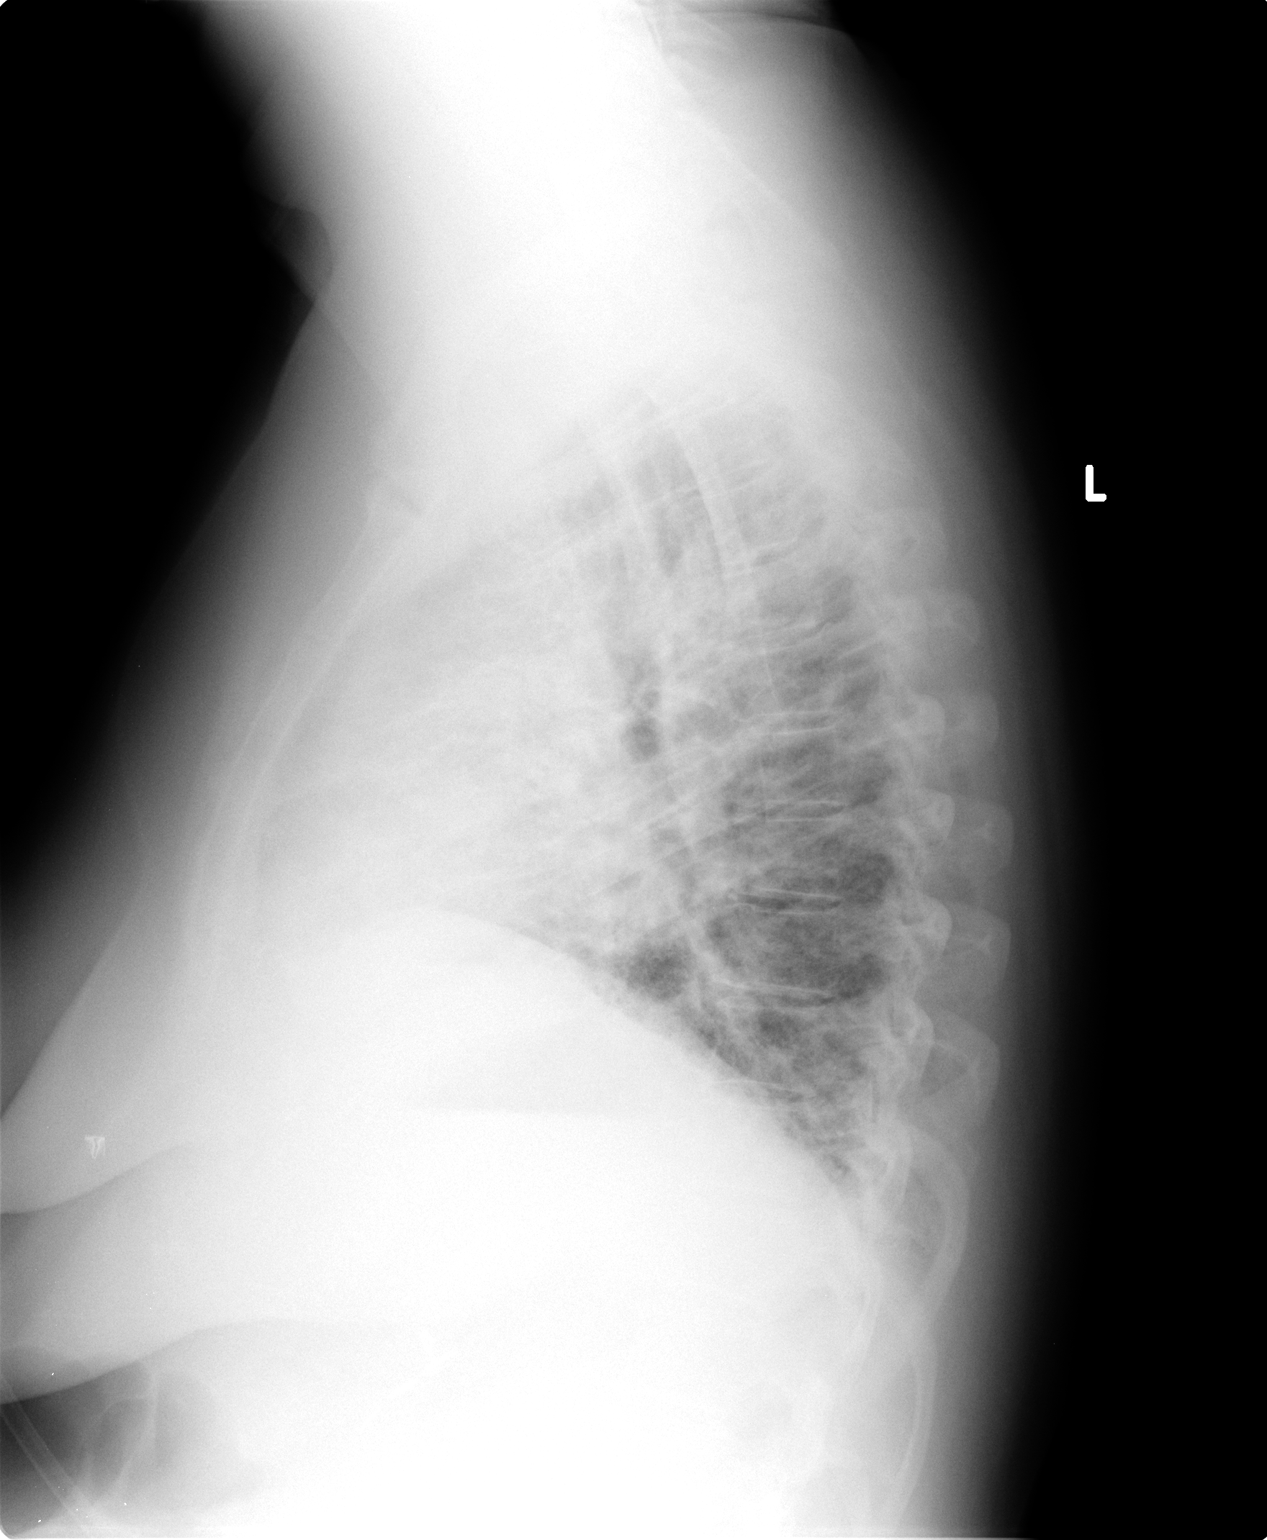

[2 of 2 positions shown; findings below may reference images not displayed]

FINDINGS: Two views study again shows diffuse underlying chronic interstitial
lung disease. No new focal airspace consolidation or evidence of
overt pulmonary edema. No pleural effusion. Cardiopericardial
silhouette is at upper limits of normal for size. Imaged bony
structures of the thorax are intact.
IMPRESSION: Stable. Diffuse chronic underlying interstitial changes compatible
with reported history of pulmonary fibrosis.

## 2015-09-17 ENCOUNTER — Encounter (HOSPITAL_COMMUNITY): Payer: 59

## 2015-09-17 ENCOUNTER — Telehealth: Payer: Self-pay | Admitting: Internal Medicine

## 2015-09-17 NOTE — Telephone Encounter (Signed)
Received called from [place] at [facility]. She stated that Dr. Estill Cotta changed patient from Prednisone to Dexamethasone due to lower extremity edema. Attempted to contact Apolonio Schneiders, however, the number she gave as a call back was the main Hospice number and they said she was out of the office and could not be reached.  FYI - Dr. Chase Caller

## 2015-09-18 IMAGING — CR DG CHEST 1V PORT
1 series · 1 of 1 positions shown · non-contrast
Comparison: 05/05/2015 at [DATE]

CLINICAL DATA: Dyspnea

EXAM:
PORTABLE CHEST - 1 VIEW

[AP]
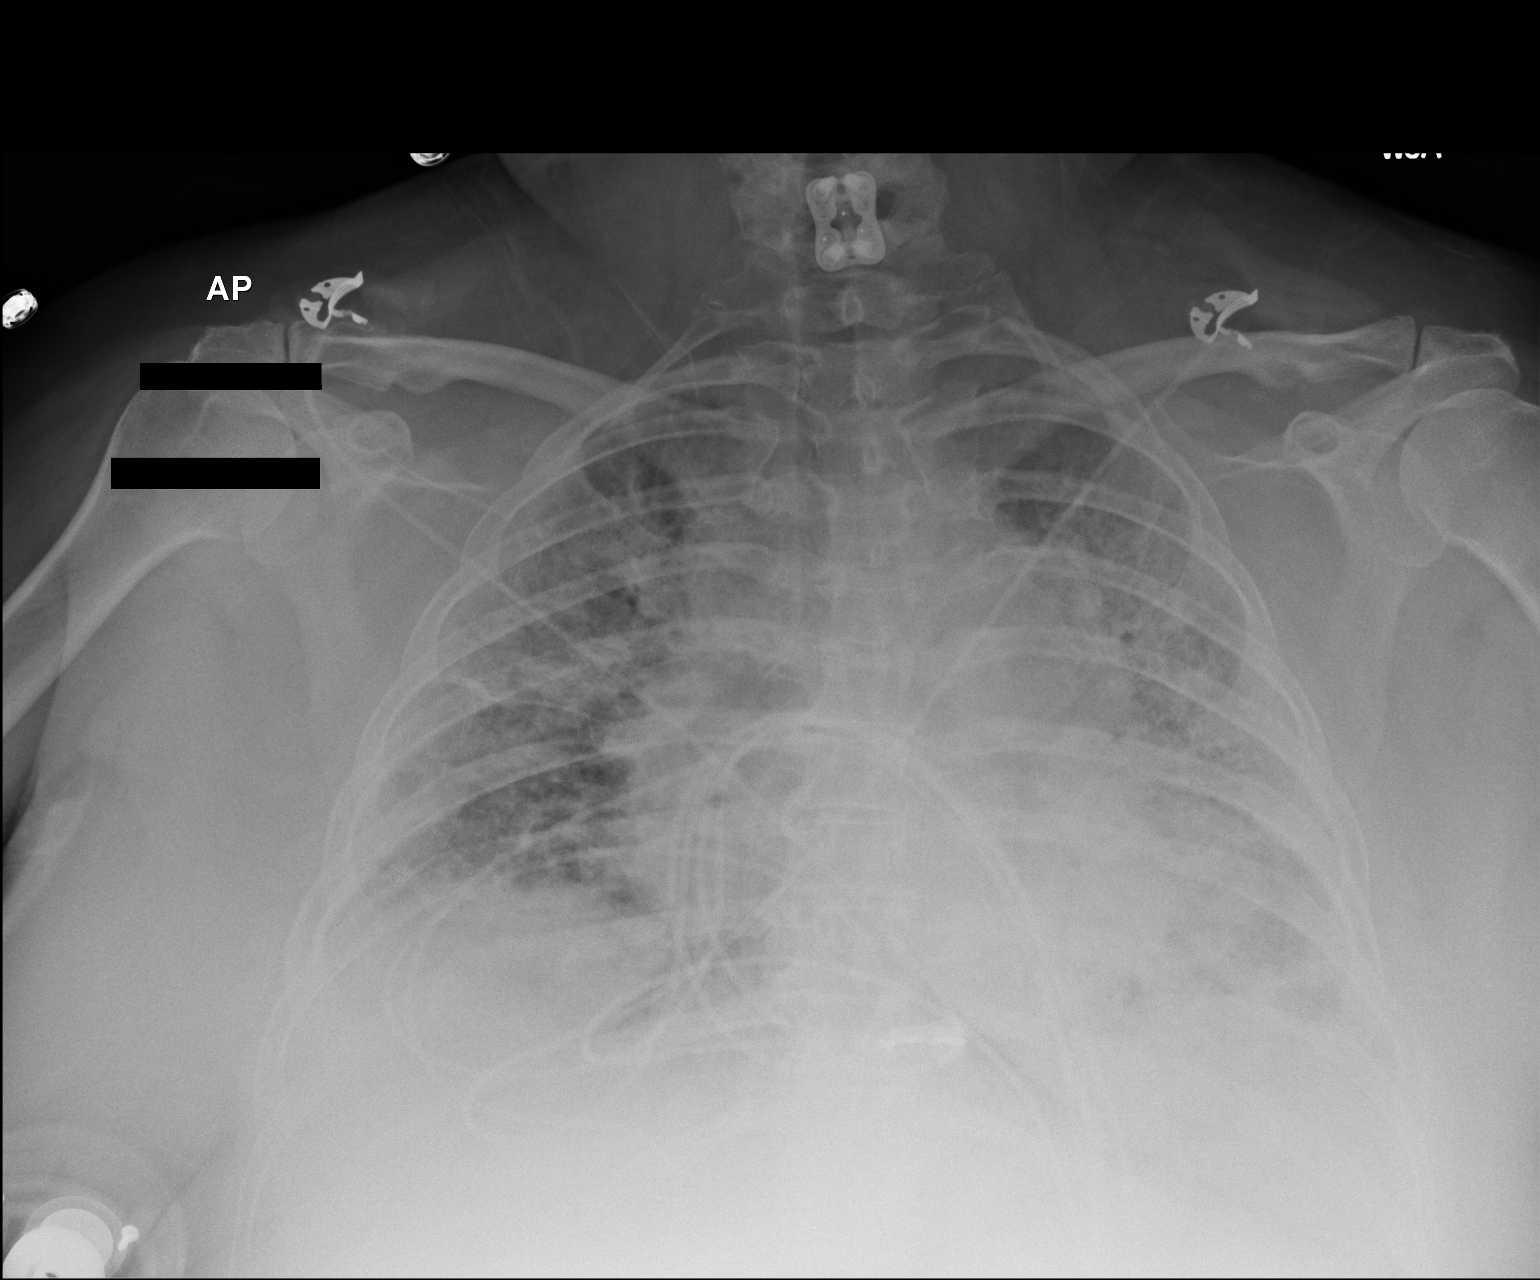

[1 of 1 positions shown; findings below may reference images not displayed]

FINDINGS: There is left base airspace opacity which has worsened. This could
represent infectious infiltrate or alveolar edema, superimposed on
the severe chronic fibrotic changes. No large effusion is evident.
No pneumothorax is evident.
IMPRESSION: Developing left base airspace opacity superimposed on severe chronic
changes.

## 2015-09-18 IMAGING — CT CT ANGIO CHEST
2 of 8 series · 18 of 46 positions shown · IV contrast (Omni 300)
Comparison: 03/29/2015

CLINICAL DATA: Shortness of Breath

EXAM:
CT ANGIOGRAPHY CHEST WITH CONTRAST
TECHNIQUE: Multidetector CT imaging of the chest was performed using the
standard protocol during bolus administration of intravenous
contrast. Multiplanar CT image reconstructions and MIPs were
obtained to evaluate the vascular anatomy.
CONTRAST:  100mL OMNIPAQUE IOHEXOL 350 MG/ML SOLN

[Series 5: thins · axial · 0.71mm/px · z∈[+1070,+1304]mm · 15 of 258 slices shown]
[im 12/258  lung]
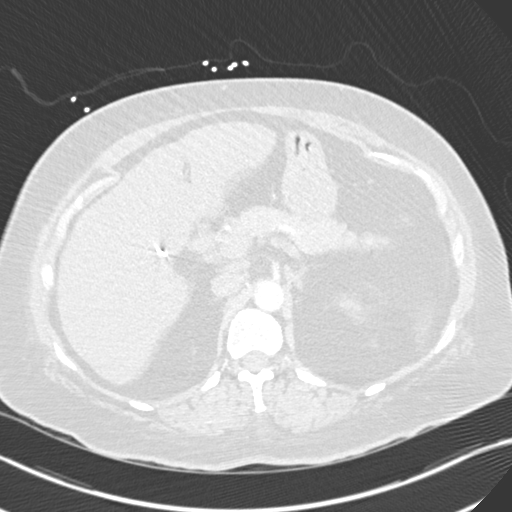
[im 36/258  soft-tissue]
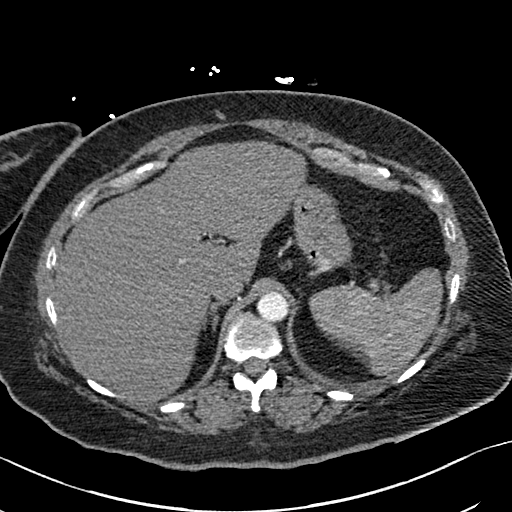
[im 47/258  lung]
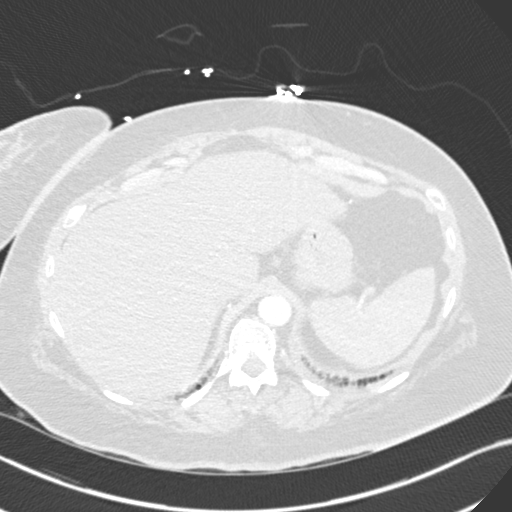
[im 59/258  soft-tissue]
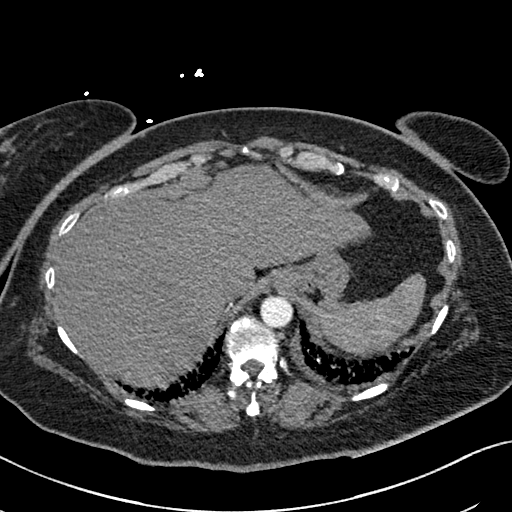
[im 82/258  lung]
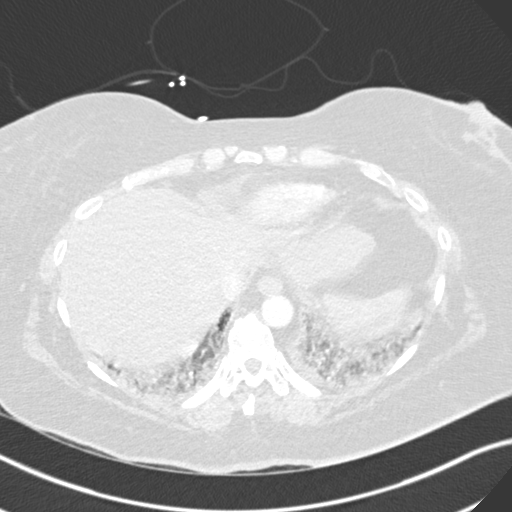
[im 94/258  soft-tissue]
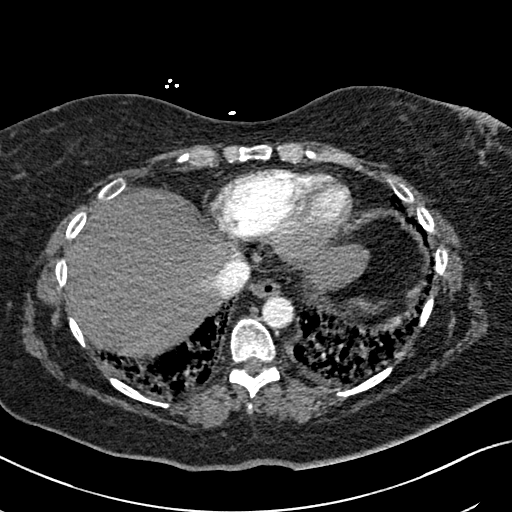
[im 117/258  lung]
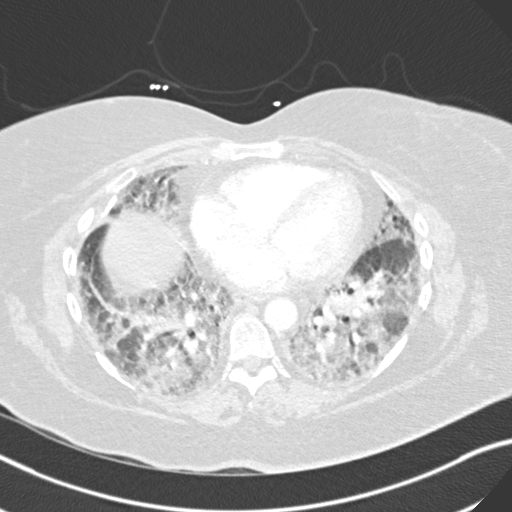
[im 129/258  soft-tissue]
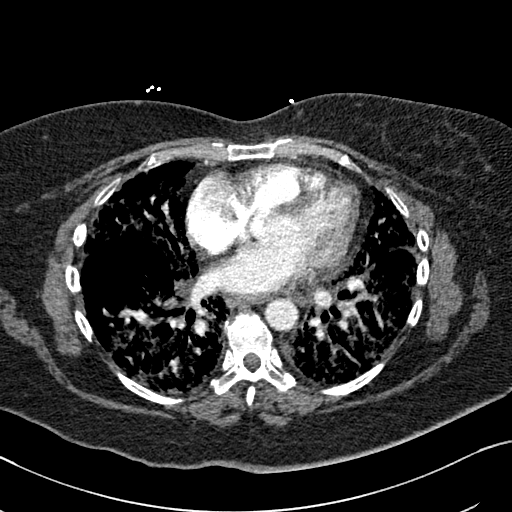
[im 141/258  lung]
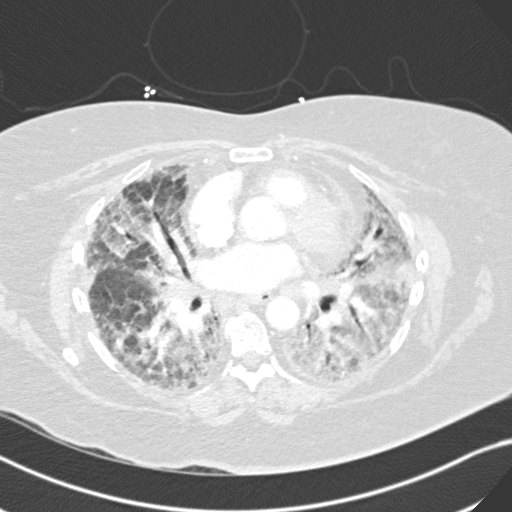
[im 164/258  soft-tissue]
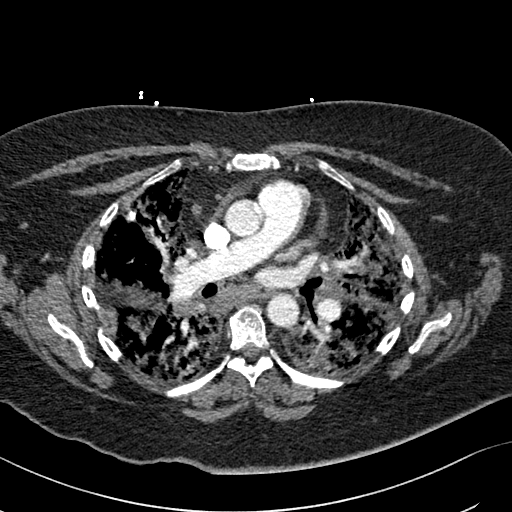
[im 176/258  lung]
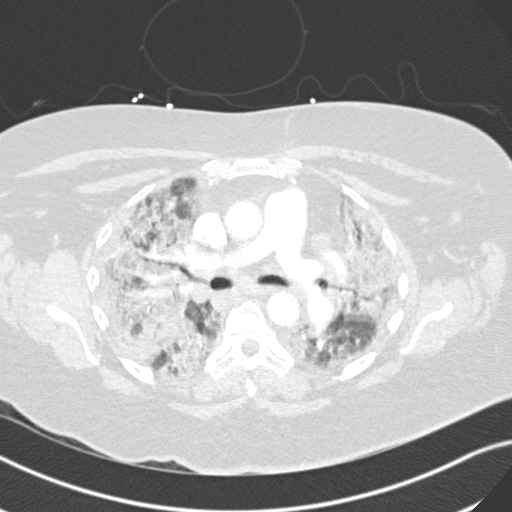
[im 199/258  soft-tissue]
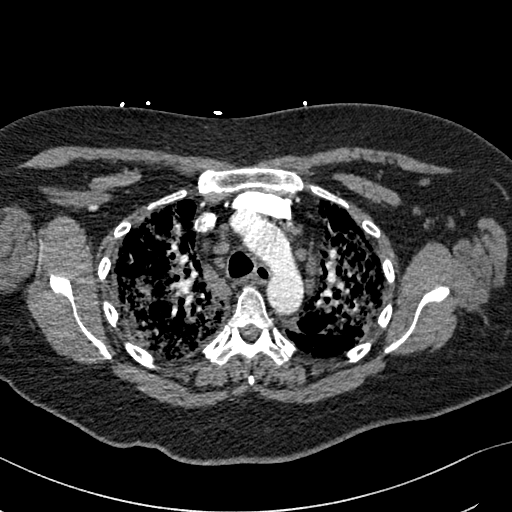
[im 211/258  lung]
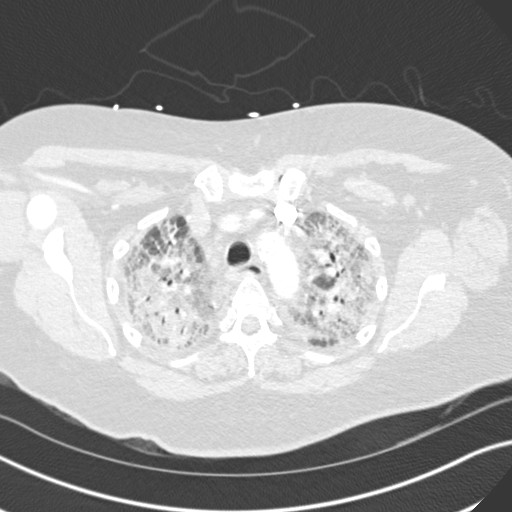
[im 222/258  soft-tissue]
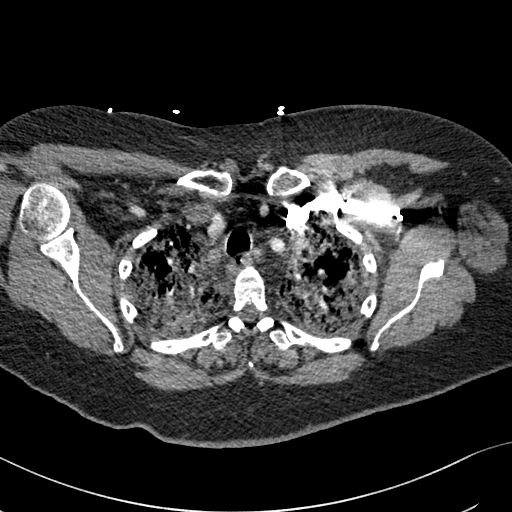
[im 246/258  lung]
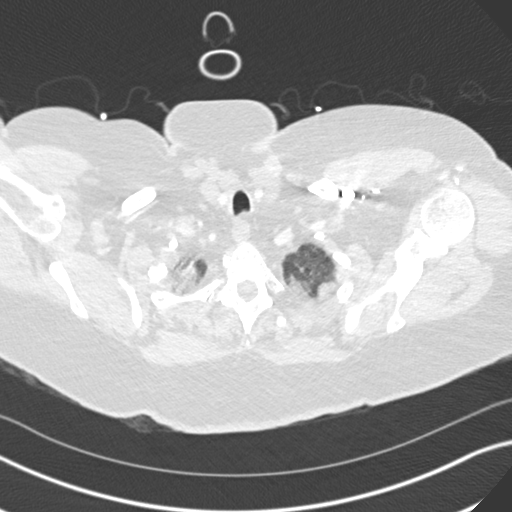

[Series 7: coronal mpr · coronal · 0.53mm/px · 3 of 133 slices shown]
[im 34/133  soft-tissue]
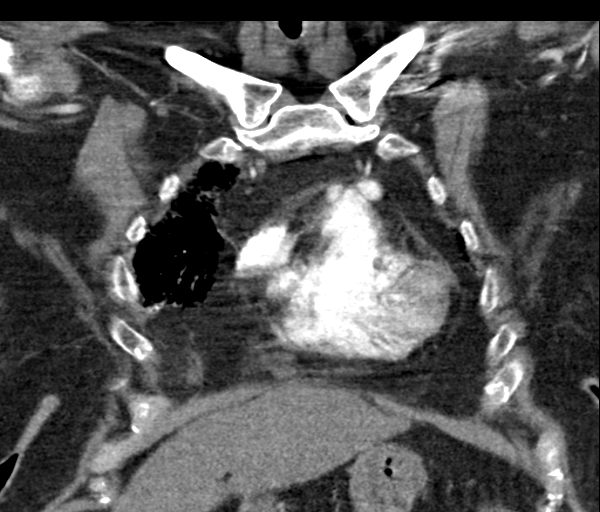
[im 67/133  soft-tissue]
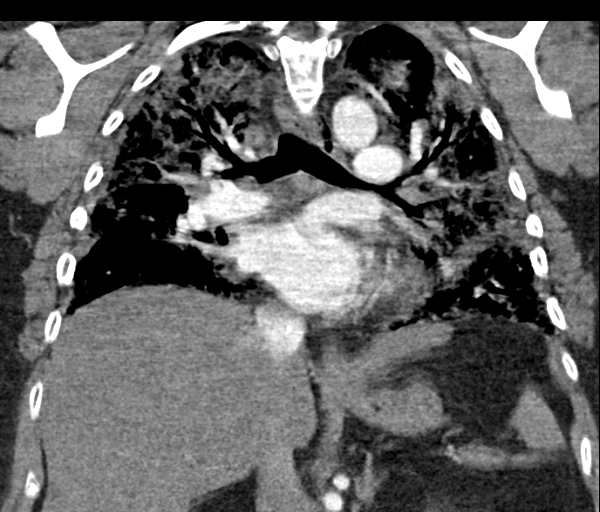
[im 100/133  soft-tissue]
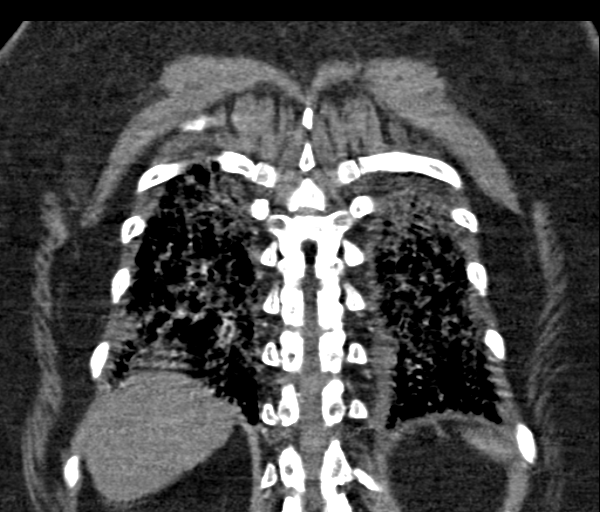

[18 of 46 positions shown; findings below may reference images not displayed]

FINDINGS: Lungs are well aerated bilaterally without focal effusion. Diffuse
fibrotic changes with increased superimposed infiltrative changes
are noted.

The thoracic inlet is within normal limits. The thoracic aorta
demonstrates some mild atherosclerotic calcifications. No aneurysmal
dilatation is noted. The pulmonary artery is well visualized and
shows a normal branching pattern. No definitive intraluminal filling
defect to suggest pulmonary embolism is identified. Scattered hilar
and mediastinal lymphadenopathy is identified. The lymphadenopathy
is stable from the prior exam. The visualized upper abdomen is
within normal limits. No acute bony abnormality is seen.

Review of the MIP images confirms the above findings.
IMPRESSION: Known chronic fibrotic changes bilaterally.

Diffuse parenchymal infiltrates identified which have increased from
previous CT. This again may represent progressive changes of usual
interstitial pneumonia.

Stable bilateral hilar and mediastinal lymphadenopathy.

## 2015-09-18 NOTE — Telephone Encounter (Signed)
That is fine. Will sign off message

## 2015-09-19 ENCOUNTER — Encounter (HOSPITAL_COMMUNITY): Payer: 59

## 2015-09-24 ENCOUNTER — Encounter (HOSPITAL_COMMUNITY): Payer: 59

## 2015-09-26 ENCOUNTER — Encounter (HOSPITAL_COMMUNITY): Payer: 59

## 2015-10-01 ENCOUNTER — Encounter (HOSPITAL_COMMUNITY): Payer: 59

## 2015-10-03 ENCOUNTER — Encounter (HOSPITAL_COMMUNITY): Payer: 59

## 2015-10-08 ENCOUNTER — Encounter (HOSPITAL_COMMUNITY): Payer: 59

## 2015-10-10 ENCOUNTER — Encounter (HOSPITAL_COMMUNITY): Payer: 59

## 2015-10-10 DEATH — deceased

## 2015-10-15 ENCOUNTER — Encounter (HOSPITAL_COMMUNITY): Payer: 59

## 2015-10-17 ENCOUNTER — Encounter (HOSPITAL_COMMUNITY): Payer: 59

## 2015-10-22 ENCOUNTER — Encounter (HOSPITAL_COMMUNITY): Payer: 59

## 2015-10-23 ENCOUNTER — Telehealth: Payer: Self-pay | Admitting: Internal Medicine

## 2015-10-23 NOTE — Telephone Encounter (Signed)
Garlon Hatchet with 1961/08/18 died October 05, 2015. Got a letter from hospice dated 10/02/15. Please   A) prepare a condolence card for her family with address [place] [address]  B) pls let med records know that she is deceased  Thanks  Dr. Brand Males, M.D., [facility].C.P Pulmonary and Critical Care Medicine Staff Physician [facility] Pager: [phone], If no answer or between  15:00h - 7:00h: call [phone]  10/23/2015 4:46 AM

## 2015-10-24 ENCOUNTER — Encounter (HOSPITAL_COMMUNITY): Payer: 59

## 2015-10-24 NOTE — Telephone Encounter (Signed)
Condolence card placed in MR's look ats. Nothing further needed at this time.

## 2015-10-24 NOTE — Telephone Encounter (Signed)
Called [facility] in med recs and [employer]

## 2015-10-24 NOTE — Telephone Encounter (Signed)
Sharyn Lull called back and is aware pt deceased and will mark in chart. Will forward to elise regarding card

## 2015-10-29 ENCOUNTER — Encounter (HOSPITAL_COMMUNITY): Payer: 59

## 2015-10-31 ENCOUNTER — Encounter (HOSPITAL_COMMUNITY): Payer: 59

## 2015-11-05 ENCOUNTER — Encounter (HOSPITAL_COMMUNITY): Payer: 59

## 2015-11-07 ENCOUNTER — Encounter (HOSPITAL_COMMUNITY): Payer: 59

## 2015-11-19 ENCOUNTER — Ambulatory Visit: Payer: 59 | Admitting: Internal Medicine

## 2015-11-29 ENCOUNTER — Ambulatory Visit: Payer: 59 | Admitting: Adult Health
